# Patient Record
Sex: Male | Born: 1955
Health system: Southern US, Community
[De-identification: ages and names within clinical notes are randomized; demographics above are authoritative.]

## PROBLEM LIST (undated history)

## (undated) DIAGNOSIS — I429 Cardiomyopathy, unspecified: Secondary | ICD-10-CM

## (undated) DIAGNOSIS — C801 Malignant (primary) neoplasm, unspecified: Secondary | ICD-10-CM

## (undated) DIAGNOSIS — M1990 Unspecified osteoarthritis, unspecified site: Secondary | ICD-10-CM

## (undated) DIAGNOSIS — I10 Essential (primary) hypertension: Secondary | ICD-10-CM

## (undated) DIAGNOSIS — E119 Type 2 diabetes mellitus without complications: Secondary | ICD-10-CM

## (undated) DIAGNOSIS — I5022 Chronic systolic (congestive) heart failure: Secondary | ICD-10-CM

## (undated) DIAGNOSIS — E785 Hyperlipidemia, unspecified: Secondary | ICD-10-CM

## (undated) HISTORY — DX: Essential (primary) hypertension: I10

## (undated) HISTORY — DX: Type 2 diabetes mellitus without complications: E11.9

## (undated) HISTORY — DX: Hyperlipidemia, unspecified: E78.5

---

## 1998-03-05 ENCOUNTER — Ambulatory Visit (HOSPITAL_COMMUNITY): Admission: RE | Admit: 1998-03-05 | Discharge: 1998-03-05 | Payer: Self-pay | Admitting: Internal Medicine

## 1998-04-01 ENCOUNTER — Ambulatory Visit (HOSPITAL_COMMUNITY): Admission: RE | Admit: 1998-04-01 | Discharge: 1998-04-01 | Payer: Self-pay | Admitting: Cardiology

## 1998-04-21 ENCOUNTER — Encounter (HOSPITAL_COMMUNITY): Admission: RE | Admit: 1998-04-21 | Discharge: 1998-07-20 | Payer: Self-pay | Admitting: Cardiology

## 2001-11-21 ENCOUNTER — Encounter: Admission: RE | Admit: 2001-11-21 | Discharge: 2002-02-19 | Payer: Self-pay | Admitting: Family Medicine

## 2003-01-13 ENCOUNTER — Other Ambulatory Visit: Admission: RE | Admit: 2003-01-13 | Discharge: 2003-01-13 | Payer: Self-pay | Admitting: Family Medicine

## 2003-02-08 ENCOUNTER — Ambulatory Visit (HOSPITAL_COMMUNITY): Admission: RE | Admit: 2003-02-08 | Discharge: 2003-02-08 | Payer: Self-pay | Admitting: Orthopedic Surgery

## 2003-02-08 ENCOUNTER — Encounter: Payer: Self-pay | Admitting: Orthopedic Surgery

## 2003-07-26 HISTORY — PX: SHOULDER SURGERY: SHX246

## 2003-09-10 ENCOUNTER — Ambulatory Visit (HOSPITAL_COMMUNITY): Admission: RE | Admit: 2003-09-10 | Discharge: 2003-09-10 | Payer: Self-pay | Admitting: Orthopedic Surgery

## 2003-09-20 ENCOUNTER — Ambulatory Visit (HOSPITAL_COMMUNITY): Admission: AD | Admit: 2003-09-20 | Discharge: 2003-09-22 | Payer: Self-pay | Admitting: Orthopedic Surgery

## 2006-03-08 ENCOUNTER — Encounter: Admission: RE | Admit: 2006-03-08 | Discharge: 2006-06-06 | Payer: Self-pay | Admitting: Family Medicine

## 2006-05-31 ENCOUNTER — Encounter: Admission: RE | Admit: 2006-05-31 | Discharge: 2006-08-29 | Payer: Self-pay | Admitting: Family Medicine

## 2013-02-11 ENCOUNTER — Telehealth (HOSPITAL_COMMUNITY): Payer: Self-pay | Admitting: Cardiovascular Disease

## 2013-03-11 ENCOUNTER — Telehealth (HOSPITAL_COMMUNITY): Payer: Self-pay | Admitting: Cardiovascular Disease

## 2013-03-14 ENCOUNTER — Other Ambulatory Visit (HOSPITAL_COMMUNITY): Payer: Self-pay | Admitting: Cardiovascular Disease

## 2013-03-14 DIAGNOSIS — I119 Hypertensive heart disease without heart failure: Secondary | ICD-10-CM

## 2013-03-26 ENCOUNTER — Ambulatory Visit (HOSPITAL_COMMUNITY)
Admission: RE | Admit: 2013-03-26 | Discharge: 2013-03-26 | Disposition: A | Discharge status: Home / Self Care | Payer: 59 | Source: Ambulatory Visit | Attending: Cardiovascular Disease | Admitting: Cardiovascular Disease

## 2013-03-26 ENCOUNTER — Ambulatory Visit: Payer: Self-pay | Admitting: Cardiovascular Disease

## 2013-03-26 DIAGNOSIS — E785 Hyperlipidemia, unspecified: Secondary | ICD-10-CM | POA: Insufficient documentation

## 2013-03-26 DIAGNOSIS — I119 Hypertensive heart disease without heart failure: Secondary | ICD-10-CM

## 2013-03-26 DIAGNOSIS — I10 Essential (primary) hypertension: Secondary | ICD-10-CM | POA: Insufficient documentation

## 2013-03-26 DIAGNOSIS — E669 Obesity, unspecified: Secondary | ICD-10-CM | POA: Insufficient documentation

## 2013-03-26 DIAGNOSIS — E119 Type 2 diabetes mellitus without complications: Secondary | ICD-10-CM | POA: Insufficient documentation

## 2013-03-26 NOTE — Progress Notes (Signed)
[facility]   2D echo completed 03/26/2013.   Cindy Omara Alcon, RDCS  

## 2013-04-02 ENCOUNTER — Encounter: Payer: Self-pay | Admitting: Cardiovascular Disease

## 2013-04-02 ENCOUNTER — Ambulatory Visit (INDEPENDENT_AMBULATORY_CARE_PROVIDER_SITE_OTHER): Payer: 59 | Admitting: Cardiovascular Disease

## 2013-04-02 ENCOUNTER — Ambulatory Visit: Payer: Self-pay | Admitting: Cardiovascular Disease

## 2013-04-02 VITALS — BP 110/80 | HR 102 | Ht 72.0 in | Wt 255.2 lb

## 2013-04-02 DIAGNOSIS — B3324 Viral cardiomyopathy: Secondary | ICD-10-CM

## 2013-04-02 DIAGNOSIS — E119 Type 2 diabetes mellitus without complications: Secondary | ICD-10-CM

## 2013-04-02 DIAGNOSIS — E11621 Type 2 diabetes mellitus with foot ulcer: Secondary | ICD-10-CM | POA: Insufficient documentation

## 2013-04-02 DIAGNOSIS — I498 Other specified cardiac arrhythmias: Secondary | ICD-10-CM

## 2013-04-02 DIAGNOSIS — I428 Other cardiomyopathies: Secondary | ICD-10-CM

## 2013-04-02 DIAGNOSIS — R000 Tachycardia, unspecified: Secondary | ICD-10-CM

## 2013-04-02 DIAGNOSIS — E785 Hyperlipidemia, unspecified: Secondary | ICD-10-CM

## 2013-04-02 DIAGNOSIS — E669 Obesity, unspecified: Secondary | ICD-10-CM | POA: Insufficient documentation

## 2013-04-02 MED ORDER — METOPROLOL SUCCINATE ER 50 MG PO TB24: ORAL_TABLET | ORAL | Status: DC

## 2013-04-02 MED ORDER — RAMIPRIL 10 MG PO CAPS: 10.0000 mg | ORAL_CAPSULE | Freq: Every day | ORAL | Status: DC

## 2013-04-02 NOTE — Patient Instructions (Signed)
Your physician recommends that you return for lab work in: 3 months. Your physician recommends that you schedule a follow-up appointment in: 3 months.  Your physician has recommended you make the following change in your medication: decrease your ramipril to 1 pill daily ( 10 mg)  Start new prescription for metoprolol as directed on the bottle. This prescription has already been sent to your pharmacy.

## 2013-04-02 NOTE — Progress Notes (Signed)
Patient ID: Brian Herring, male   DOB: [birth year], 57 y.o.   MRN: [id number]     HPI: Brian Herring, is a 57 y.o. male who presents to the office for a one-year cardiology evaluation.  Brian Herring is a former patient of Dr. Lavonne Chick. 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 With medical therapy only function has improved to 45-50% range noted on echo Doppler study in April 2012. He recently underwent to your followup echo Doppler study of 03/26/2013. Ejection fraction was now 45-50% with diffuse hypokinesis. He had normal pulmonary pressures.  Brian Herring remains fairly active. He is a truck Office manager in showers. He denies recent episodes of chest pain. He is unaware of tachycardia palpitations. He tells me Dr. Gildardo Cranker recently checked laboratory. We will try to obtain these results for review  Past Medical History  Diagnosis Date  . Hypertension   . Hyperlipidemia   . Diabetes mellitus without complication     Past Surgical History  Procedure Laterality Date  . Shoulder surgery  01/05    Allergies  Allergen Reactions  . Sulfur Rash    Current Outpatient Prescriptions  Medication Sig Dispense Refill  . aspirin 325 MG tablet Take 325 mg by mouth daily.      . Multiple Vitamins-Minerals (MULTIVITAL PO) Take by mouth.      . spironolactone (ALDACTONE) 25 MG tablet Take 25 mg by mouth 2 (two) times daily. 1/2 tablet      . amLODipine (NORVASC) 5 MG tablet Take 1 tablet by mouth daily.      Marland Kitchen atorvastatin (LIPITOR) 40 MG tablet Take 1 tablet by mouth daily.      . fenofibrate micronized (LOFIBRA) 200 MG capsule Take 1 capsule by mouth daily.      Marland Kitchen glimepiride (AMARYL) 2 MG tablet Take 1 tablet by mouth daily.      . metFORMIN (GLUCOPHAGE) 500 MG tablet Take 2 tablets by mouth 2 (two) times daily.      . ONE TOUCH ULTRA TEST test strip       . ONETOUCH DELICA LANCETS FINE MISC       . ramipril (ALTACE) 10 MG capsule  Take 2 capsules by mouth daily.      . traZODone (DESYREL) 150 MG tablet        No current facility-administered medications for this visit.    History   Social History  . Marital Status: Single    Spouse Name: N/A    Number of Children: N/A  . Years of Education: N/A   Occupational History  . Not on file.   Social History Main Topics  . Smoking status: Never Smoker   . Smokeless tobacco: Never Used  . Alcohol Use: No  . Drug Use: Not on file  . Sexual Activity: Not on file   Other Topics Concern  . Not on file   Social History Narrative  . No narrative on file    Family History  Problem Relation Age of Onset  . Pneumonia Mother     aspiration   Socially he is single with no children. No tobacco or alcohol use. He does not routinely exercise.  ROS is negative for fevers, chills or night sweats. He denies any significant weight loss. He denies wheezing. He denies PND. He denies tachycardia palpitations. He denies chest pain. He denies change in bowel or bladder habits. There is no claudication. There  are no myalgias. He denies rash. He does have diabetes as well as hyperlipidemia.   Other system review is negative.  PE BP 110/80  Pulse 102  Ht 6' (1.829 m)  Wt 255 lb 3.2 oz (115.758 kg)  BMI 34.6 kg/m2  General: Alert, oriented, no distress.  Skin: normal turgor, no rashes HEENT: Normocephalic, atraumatic. Pupils round and reactive; sclera anicteric;no lid lag.  Nose without nasal septal hypertrophy Mouth/Parynx benign; Mallinpatti scale 3 Neck: No JVD, no carotid briuts Lungs: clear to ausculatation and percussion; no wheezing or rales Heart: RRR, s1 s2 normal faint 1/6 systolic murmur. Abdomen: Moderate to Poss soft, nontender; no hepatosplenomehaly, BS+; abdominal aorta nontender and not dilated by palpation. Pulses 2+ Extremities: no clubbing cyanosis or edema, Homan's sign negative  Neurologic: grossly nonfocal  ECG: Sinus tachycardia 102 beats per  minute. QTc interval 461 ms.  LABS:  BMET No results found for this basename: na, k, cl, co2, glucose, bun, creatinine, calcium, gfrnonaa, gfraa     Hepatic Function Panel  No results found for this basename: prot, albumin, ast, alt, alkphos, bilitot, bilidir, ibili     CBC No results found for this basename: wbc, rbc, hgb, hct, plt, mcv, mch, mchc, rdw, neutrabs, lymphsabs, monoabs, eosabs, basosabs     BNP No results found for this basename: probnp    Lipid Panel  No results found for this basename: chol, trig, hdl, cholhdl, vldl, ldlcalc     RADIOLOGY: No results found.    ASSESSMENT AND PLAN: Brian Herring is now 57 years old. His most recent echo Doppler study shows a stable ejection fraction which remains now in the 45-50% range, unchanged from April 2012. His blood pressure is somewhat low and he does have evidence for sinus tachycardia. Try to obtain the results of the lab work done by Dr. Tenny Craw. If thyroid function studies have not been checked these should be assessed. Presently, I am recommending he reduce his whole today's dose from 20 mg down to 10 mg daily. Electing to start cardioselective beta blocker therapy with Toprol-XL 25 mg for 2 weeks and then he will titrate this to 50 mg daily. I will see him in 3 months if up evaluation and prior to that office as a complete set of laboratory will be obtained. We did discuss the importance of weight loss and increased exercise.     Lennette Bihari, MD, [facility]  04/02/2013 5:40 PM

## 2013-04-03 ENCOUNTER — Other Ambulatory Visit: Payer: Self-pay | Admitting: *Deleted

## 2013-04-03 DIAGNOSIS — I498 Other specified cardiac arrhythmias: Secondary | ICD-10-CM

## 2013-04-03 DIAGNOSIS — I428 Other cardiomyopathies: Secondary | ICD-10-CM

## 2013-04-03 DIAGNOSIS — E119 Type 2 diabetes mellitus without complications: Secondary | ICD-10-CM

## 2013-04-15 ENCOUNTER — Encounter: Payer: Self-pay | Admitting: *Deleted

## 2013-06-27 ENCOUNTER — Telehealth: Payer: Self-pay | Admitting: *Deleted

## 2013-06-27 ENCOUNTER — Encounter: Payer: Self-pay | Admitting: *Deleted

## 2013-06-27 ENCOUNTER — Other Ambulatory Visit: Payer: Self-pay | Admitting: *Deleted

## 2013-06-27 DIAGNOSIS — E119 Type 2 diabetes mellitus without complications: Secondary | ICD-10-CM

## 2013-06-27 DIAGNOSIS — I498 Other specified cardiac arrhythmias: Secondary | ICD-10-CM

## 2013-06-27 NOTE — Telephone Encounter (Signed)
Released and mailed lab orders for patient to get blood drawn.

## 2013-06-27 NOTE — Telephone Encounter (Signed)
Message copied by Gaynelle Cage on Thu Jun 27, 2013 11:17 AM ------      Message from: Gaynelle Cage.      Created: Tue Apr 02, 2013  5:52 PM       Send patient labslip to get blood drawn in 3 months. ------

## 2013-07-23 LAB — CBC
HCT: 45.8 % (ref 39.0–52.0)
Hemoglobin: 15.8 g/dL (ref 13.0–17.0)
MCHC: 34.5 g/dL (ref 30.0–36.0)
MCV: 86.4 fL (ref 78.0–100.0)
RBC: 5.3 MIL/uL (ref 4.22–5.81)
RDW: 14.3 % (ref 11.5–15.5)
WBC: 10.2 10*3/uL (ref 4.0–10.5)

## 2013-07-24 LAB — COMPREHENSIVE METABOLIC PANEL
ALT: 53 U/L (ref 0–53)
AST: 48 U/L — ABNORMAL HIGH (ref 0–37)
Albumin: 4.4 g/dL (ref 3.5–5.2)
Alkaline Phosphatase: 110 U/L (ref 39–117)
BUN: 20 mg/dL (ref 6–23)
CO2: 25 mEq/L (ref 19–32)
Calcium: 9.2 mg/dL (ref 8.4–10.5)
Chloride: 95 mEq/L — ABNORMAL LOW (ref 96–112)
Creat: 0.76 mg/dL (ref 0.50–1.35)
Glucose, Bld: 278 mg/dL — ABNORMAL HIGH (ref 70–99)
Potassium: 4.6 mEq/L (ref 3.5–5.3)
Sodium: 134 mEq/L — ABNORMAL LOW (ref 135–145)
Total Bilirubin: 0.5 mg/dL (ref 0.3–1.2)
Total Protein: 6.7 g/dL (ref 6.0–8.3)

## 2013-07-24 LAB — LIPID PANEL
HDL: 30 mg/dL — ABNORMAL LOW (ref >39)
LDL Cholesterol: 26 mg/dL (ref 0–99)
Total CHOL/HDL Ratio: 4.2 Ratio
Triglycerides: 354 mg/dL — ABNORMAL HIGH (ref <150)

## 2013-07-24 LAB — TSH: TSH: 1.978 u[IU]/mL (ref 0.350–4.500)

## 2013-07-24 LAB — HEMOGLOBIN A1C: Mean Plasma Glucose: 358 mg/dL — ABNORMAL HIGH (ref <117)

## 2013-09-05 ENCOUNTER — Telehealth: Payer: Self-pay | Admitting: *Deleted

## 2013-09-05 NOTE — Telephone Encounter (Signed)
Left message to return a call to discuss lab results. 

## 2013-09-20 ENCOUNTER — Ambulatory Visit (INDEPENDENT_AMBULATORY_CARE_PROVIDER_SITE_OTHER): Payer: 59 | Admitting: Cardiovascular Disease

## 2013-09-20 ENCOUNTER — Encounter: Payer: Self-pay | Admitting: Cardiovascular Disease

## 2013-09-20 VITALS — BP 138/82 | HR 95 | Ht 72.0 in | Wt 250.4 lb

## 2013-09-20 DIAGNOSIS — B3324 Viral cardiomyopathy: Secondary | ICD-10-CM

## 2013-09-20 DIAGNOSIS — R000 Tachycardia, unspecified: Secondary | ICD-10-CM

## 2013-09-20 DIAGNOSIS — E669 Obesity, unspecified: Secondary | ICD-10-CM

## 2013-09-20 DIAGNOSIS — E119 Type 2 diabetes mellitus without complications: Secondary | ICD-10-CM

## 2013-09-20 DIAGNOSIS — E785 Hyperlipidemia, unspecified: Secondary | ICD-10-CM

## 2013-09-20 DIAGNOSIS — I428 Other cardiomyopathies: Secondary | ICD-10-CM

## 2013-09-20 DIAGNOSIS — I498 Other specified cardiac arrhythmias: Secondary | ICD-10-CM

## 2013-09-20 MED ORDER — FENOFIBRATE 145 MG PO TABS
145.0000 mg | ORAL_TABLET | Freq: Every day | ORAL | Status: DC
Start: 2013-09-20 — End: 2014-07-04

## 2013-09-20 MED ORDER — OMEGA-3-ACID ETHYL ESTERS 1 G PO CAPS: 2.0000 g | ORAL_CAPSULE | Freq: Two times a day (BID) | ORAL | Status: DC

## 2013-09-20 MED ORDER — METOPROLOL SUCCINATE ER 100 MG PO TB24: 100.0000 mg | ORAL_TABLET | Freq: Every day | ORAL | Status: DC

## 2013-09-20 NOTE — Patient Instructions (Signed)
Your physician has recommended you make the following change in your medication: start new prescription for fenofibrate 145 mg, lovaza 1 gram, increase the metoptolol succ to 100mg . STOP the carvedilol. DECREASE the aspirin to 81 mg.   Schedule a follow up appointment with your endocinologist.  Your physician recommends that you schedule a follow-up appointment in: 6 months.

## 2013-09-21 ENCOUNTER — Encounter: Payer: Self-pay | Admitting: Cardiovascular Disease

## 2013-09-21 NOTE — Progress Notes (Signed)
Patient ID: Brian Herring, male   DOB: 1956/07/17, 58 y.o.   MRN: [id number]      HPI: Brian Herring, is a 58 y.o. male who presents to the office for a 6 month cardiology evaluation.  Brian Herring is a former patient of Brian Herring. 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 With medical therapy EF has improved to 45-50% range noted on echo Doppler study in April 2012. An echo Doppler study of 03/26/2013 showed EF  45-50% with diffuse hypokinesis. He had normal pulmonary pressures.  Brian Herring remains fairly active. He is a truck Dispensing optician in showers. He denies recent episodes of chest pain. He is unaware of tachycardia palpitations. He tells me Brian Herring recently checked laboratory.  Apparently, he has been confused and taking some of his medications. He apparently is still taking carvedilol but is taking this 25 mg once a day as well as metoprolol succinate 50 mg daily. He denies recent episodes of chest pain or shortness of breath.  Recent laboratory shows significant triglyceride elevation at 354 with a low HDL at 30 and markedly increased VLDL at 71 of those total cholesterol was 127 and LDL 26 consistent with an atherogenic dyslipidemia pattern. His TSH was normal at 1.978. Hemoglobin A1c was markedly elevated at 14.1 with a mean plasma glucose of 358. Apparently, Brian Herring stopped taking his fenofibrate and has been eating poorly. He presents for evaluation.  Past Medical History  Diagnosis Date  . Hypertension   . Hyperlipidemia   . Diabetes mellitus without complication     Past Surgical History  Procedure Laterality Date  . Shoulder surgery  01/05    Allergies  Allergen Reactions  . Sulfur Rash    Current Outpatient Prescriptions  Medication Sig Dispense Refill  . acarbose (PRECOSE) 25 MG tablet Take 1 tablet by mouth 2 (two) times daily.      Marland Kitchen amLODipine (NORVASC) 5 MG tablet Take 1 tablet by mouth  daily.      Marland Kitchen aspirin 81 MG tablet Take 81 mg by mouth daily.      Marland Kitchen atorvastatin (LIPITOR) 40 MG tablet Take 1 tablet by mouth daily.      Marland Kitchen glimepiride (AMARYL) 2 MG tablet Take 1 tablet by mouth daily.      . metFORMIN (GLUCOPHAGE) 500 MG tablet Take 2 tablets by mouth 2 (two) times daily.      . Multiple Vitamins-Minerals (MULTIVITAL PO) Take by mouth.      . ramipril (ALTACE) 10 MG capsule Take 1 capsule (10 mg total) by mouth daily.  30 capsule  6  . spironolactone (ALDACTONE) 25 MG tablet Take 25 mg by mouth 2 (two) times daily. 1/2 tablet      . fenofibrate (TRICOR) 145 MG tablet Take 1 tablet (145 mg total) by mouth daily.  30 tablet  6  . metoprolol succinate (TOPROL-XL) 100 MG 24 hr tablet Take 1 tablet (100 mg total) by mouth daily. Take with or immediately following a meal.  30 tablet  6  . omega-3 acid ethyl esters (LOVAZA) 1 G capsule Take 2 capsules (2 g total) by mouth 2 (two) times daily.  120 capsule  11   No current facility-administered medications for this visit.    History   Social History  . Marital Status: Single    Spouse Name: N/A    Number of Children: N/A  . Years  of Education: N/A   Occupational History  . Not on file.   Social History Main Topics  . Smoking status: Never Smoker   . Smokeless tobacco: Never Used  . Alcohol Use: No  . Drug Use: Not on file  . Sexual Activity: Not on file   Other Topics Concern  . Not on file   Social History Narrative  . No narrative on file    Family History  Problem Relation Age of Onset  . Pneumonia Mother     aspiration   Socially he is single with no children. No tobacco or alcohol use. He does not routinely exercise.  ROS is negative for fevers, chills or night sweats. He admits to a 5 pound weight loss.  He denies change in vision or hearing. He is unaware of lymphadenopathy. He denies wheezing. He denies PND orthopnea. He denies tachypalpitations. He denies chest pain. He denies nausea vomiting or  diarrhea. He is unaware of blood in stool or urine. He denies change in bowel or bladder habits. There is no claudication. There are no myalgias. He denies rash. He does have diabetes as well as hyperlipidemia.   He denies significant sleep issues. Other comprehensive 14 point system review is negative.  PE BP 138/82  Pulse 95  Ht 6' (1.829 m)  Wt 250 lb 6.4 oz (113.581 kg)  BMI 33.95 kg/m2  General: Alert, oriented, no distress.  Skin: normal turgor, no rashes HEENT: Normocephalic, atraumatic. Pupils round and reactive; sclera anicteric;no lid lag.  Nose without nasal septal hypertrophy Mouth/Parynx benign; Mallinpatti scale 3 Neck: No JVD, no carotid bruits with normal carotid upstroke Lungs: clear to ausculatation and percussion; no wheezing or rales Chest wall: Nontender to palpation Heart: RRR, s1 s2 normal faint 1/6 systolic murmur. No diastolic murmurs heard no rubs thrills or heaves Abdomen: Moderate central adiposity, nontender; no hepatosplenomehaly, BS+; abdominal aorta nontender and not dilated by palpation. Back: No CVA tenderness Pulses 2+ Extremities: no clubbing cyanosis or edema, Homan's sign negative  Neurologic: grossly nonfocal Psychological: Normal affect and mood  ECG (independently read by (: Normal sinus rhythm at 95 beats per minute; PR interval 160 ms, QTc interval 457 bili sec  Prior ECG of 04/02/2013: Sinus tachycardia 102 beats per minute. QTc interval 461 ms.  LABS:  BMET    Component Value Date/Time   NA 134* 07/23/2013 1608     Hepatic Function Panel     Component Value Date/Time   PROT 6.7 07/23/2013 1608     CBC    Component Value Date/Time   WBC 10.2 07/23/2013 1606     BNP No results found for this basename: probnp    Lipid Panel     Component Value Date/Time   CHOL 127 07/23/2013 1603     RADIOLOGY: No results found.    ASSESSMENT AND PLAN: Brian Herring is a 58 years old with a previous history of a presumed  viral cardiomyopathy.  His most recent echo Doppler study shows a stable ejection fraction which remains now in the 45-50% range, unchanged from April 2012. His blood pressure today is improved on his reduced dose of amlodipine at 5 mg. However, he has been confused with his medications. I have recommended he discontinue carvedilol altogether and I will further titrate his Toprol-XL to 50 mg daily which should allow for improved rate control. I have recommended resumption of fenofibrate 145 mg and adding low positive capsules twice a day for his significant hypertriglyceridemia. He needs  significantly improved diabetic control and he will followup with Dr. Tenny Craw who is his primary physician for his diabetic management. He will need to lose additional weight. I will see him in 6 months for cardiology reevaluation.   Lennette Bihari, MD, [facility]  09/21/2013 7:28 PM

## 2013-12-20 ENCOUNTER — Ambulatory Visit (INDEPENDENT_AMBULATORY_CARE_PROVIDER_SITE_OTHER): Payer: 59 | Admitting: Surgery

## 2013-12-23 ENCOUNTER — Other Ambulatory Visit: Payer: Self-pay | Admitting: *Deleted

## 2013-12-23 DIAGNOSIS — B3324 Viral cardiomyopathy: Secondary | ICD-10-CM

## 2013-12-23 MED ORDER — RAMIPRIL 10 MG PO CAPS: 10.0000 mg | ORAL_CAPSULE | Freq: Every day | ORAL | Status: DC

## 2013-12-23 NOTE — Telephone Encounter (Signed)
Rx was sent to pharmacy electronically. 

## 2014-01-06 ENCOUNTER — Other Ambulatory Visit: Payer: Self-pay

## 2014-01-06 MED ORDER — SPIRONOLACTONE 25 MG PO TABS: 12.5000 mg | ORAL_TABLET | Freq: Two times a day (BID) | ORAL | Status: DC

## 2014-01-06 NOTE — Telephone Encounter (Signed)
Rx was sent to pharmacy electronically. 

## 2014-07-01 ENCOUNTER — Telehealth: Payer: Self-pay | Admitting: *Deleted

## 2014-07-01 NOTE — Telephone Encounter (Signed)
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 Left message for patient to call to make an appointment. He will need to be seen for clearance.

## 2014-07-04 ENCOUNTER — Encounter: Payer: Self-pay | Admitting: Cardiology

## 2014-07-04 ENCOUNTER — Ambulatory Visit (INDEPENDENT_AMBULATORY_CARE_PROVIDER_SITE_OTHER): Payer: 59 | Admitting: Cardiology

## 2014-07-04 VITALS — BP 130/94 | HR 98 | Ht 72.0 in | Wt 240.9 lb

## 2014-07-04 DIAGNOSIS — I10 Essential (primary) hypertension: Secondary | ICD-10-CM | POA: Insufficient documentation

## 2014-07-04 DIAGNOSIS — B3324 Viral cardiomyopathy: Secondary | ICD-10-CM

## 2014-07-04 DIAGNOSIS — I43 Cardiomyopathy in diseases classified elsewhere: Secondary | ICD-10-CM

## 2014-07-04 DIAGNOSIS — E785 Hyperlipidemia, unspecified: Secondary | ICD-10-CM

## 2014-07-04 DIAGNOSIS — E669 Obesity, unspecified: Secondary | ICD-10-CM

## 2014-07-04 NOTE — Assessment & Plan Note (Signed)
Blood pressure is stable and has been lower at other office visits.

## 2014-07-04 NOTE — Progress Notes (Signed)
07/04/2014   PCP: Gus Height, MD   Chief Complaint  Patient presents with  . Follow-up    pt need medical clearance for driving.    Primary Cardiologist:Dr. Corky Downs   HPI: Brian Herring 58 year old male,  is a former patient of Dr. Janene Madeira. 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 With medical therapy EF has improved to 45-50% range noted on echo Doppler study in April 2012. An echo Doppler study of 03/26/2013 showed EF 45-50% with diffuse hypokinesis. He had normal pulmonary pressures. 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    He denies recent episodes of chest pain. He is unaware of tachycardia palpitations. He tells me Dr. Lona Kettle recently checked laboratory.  His diabetes is not well controlled and we discussed importance of diet and exercise. He admits to having D slide for the last little while.  He has had no shortness of breath no edema.      Allergies  Allergen Reactions  . Sulfur Rash    Current Outpatient Prescriptions  Medication Sig Dispense Refill  . acarbose (PRECOSE) 25 MG tablet Take 1 tablet by mouth 2 (two) times daily.    Marland Kitchen amLODipine (NORVASC) 5 MG tablet Take 1 tablet by mouth daily.    Marland Kitchen aspirin 81 MG tablet Take 81 mg by mouth daily.    Marland Kitchen atorvastatin (LIPITOR) 40 MG tablet Take 1 tablet by mouth daily.    Marland Kitchen glimepiride (AMARYL) 4 MG tablet Take 1 tablet by mouth daily.  1  . metFORMIN (GLUCOPHAGE) 500 MG tablet Take 2 tablets by mouth 2 (two) times daily.    . metoprolol succinate (TOPROL-XL) 100 MG 24 hr tablet Take 1 tablet (100 mg total) by mouth daily. Take with or immediately following a meal. 30 tablet 6  . Multiple Vitamins-Minerals (MULTIVITAL PO) Take by mouth.    . ramipril (ALTACE) 10 MG capsule Take 1 capsule (10 mg total) by mouth daily. 30 capsule 7  . spironolactone (ALDACTONE) 25 MG tablet Take  0.5 tablets (12.5 mg total) by mouth 2 (two) times daily. 90 tablet 2   No current facility-administered medications for this visit.    Past Medical History  Diagnosis Date  . Hypertension   . Hyperlipidemia   . Diabetes mellitus without complication     Past Surgical History  Procedure Laterality Date  . Shoulder surgery  01/05    YQM:VHQIONG:EX colds or fevers, some weight loss Skin:no rashes or ulcers HEENT:no blurred vision, no congestion CV:see HPI PUL:see HPI GI:no diarrhea constipation or melena, no indigestion GU:no hematuria, no dysuria MS:no joint pain, no claudication Neuro:no syncope, no lightheadedness Endo:+ diabetes  Now watching diet closer., no thyroid disease  Wt Readings from Last 3 Encounters:  07/04/14 240 lb 14.4 oz (109.272 kg)  09/20/13 250 lb 6.4 oz (113.581 kg)  04/02/13 255 lb 3.2 oz (115.758 kg)    PHYSICAL EXAM BP 130/94 mmHg  Pulse 98  Ht 6' (1.829 m)  Wt 240 lb 14.4 oz (109.272 kg)  BMI 32.66 kg/m2 General:Pleasant affect, NAD Skin:Warm and dry, brisk capillary refill HEENT:normocephalic, sclera clear, mucus membranes moist Neck:supple, no JVD, no bruits  Heart:S1S2 RRR without murmur, gallup, rub or click Lungs:clear without rales, rhonchi, or wheezes BMW:UXLK, non tender, + BS, do not palpate liver spleen or  masses Ext:no lower ext edema, 2+ pedal pulses, 2+ radial pulses Neuro:alert and oriented, MAE, follows commands, + facial symmetry EKG:SR without Changes form 08/2013  ASSESSMENT AND PLAN Viral cardiomyopathy, EF improved to 45-50% He has no shortness of breath no chest pain no edema. Since 2012 his ejection fraction has been stable.  He'll see Dr. Corky Downs   back in 6 months at that time he has been following echocardiogram every 2 years.  HTN (hypertension), benign Blood pressure is stable and has been lower at other office visits.  Hyperlipidemia Followed by PCP, he is on Lipitor.  Obesity Discussed diet exercise  and weight loss.

## 2014-07-04 NOTE — Assessment & Plan Note (Signed)
Discussed diet/exercise and weight loss.  

## 2014-07-04 NOTE — Assessment & Plan Note (Signed)
He has no shortness of breath no chest pain no edema. Since 2012 his ejection fraction has been stable.  He'll see Dr. Corky Downs   back in 6 months at that time he has been following echocardiogram every 2 years.

## 2014-07-04 NOTE — Assessment & Plan Note (Signed)
Followed by PCP, he is on Lipitor.

## 2014-07-04 NOTE — Patient Instructions (Signed)
Your physician wants you to follow-up in 6 months with Dr. Kelly. You will receive a reminder letter in the mail 2 months in advance. If you do not receive a letter, please call our office to schedule the follow-up appointment.  

## 2014-07-08 ENCOUNTER — Telehealth: Payer: Self-pay | Admitting: *Deleted

## 2014-07-08 NOTE — Telephone Encounter (Signed)
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

## 2015-02-09 ENCOUNTER — Other Ambulatory Visit: Payer: Self-pay

## 2015-02-09 MED ORDER — SPIRONOLACTONE 25 MG PO TABS: 12.5000 mg | ORAL_TABLET | Freq: Two times a day (BID) | ORAL | Status: DC

## 2015-02-09 NOTE — Telephone Encounter (Signed)
Rx(s) sent to pharmacy electronically.  

## 2015-04-03 ENCOUNTER — Ambulatory Visit: Payer: Self-pay | Admitting: Cardiovascular Disease

## 2015-05-29 ENCOUNTER — Ambulatory Visit (INDEPENDENT_AMBULATORY_CARE_PROVIDER_SITE_OTHER): Payer: 59 | Admitting: Cardiovascular Disease

## 2015-05-29 VITALS — BP 110/74 | HR 97 | Ht 72.0 in | Wt 253.5 lb

## 2015-05-29 DIAGNOSIS — E669 Obesity, unspecified: Secondary | ICD-10-CM

## 2015-05-29 DIAGNOSIS — B3324 Viral cardiomyopathy: Secondary | ICD-10-CM

## 2015-05-29 DIAGNOSIS — I10 Essential (primary) hypertension: Secondary | ICD-10-CM | POA: Diagnosis not present

## 2015-05-29 DIAGNOSIS — I429 Cardiomyopathy, unspecified: Secondary | ICD-10-CM

## 2015-05-29 DIAGNOSIS — E1165 Type 2 diabetes mellitus with hyperglycemia: Secondary | ICD-10-CM

## 2015-05-29 DIAGNOSIS — E785 Hyperlipidemia, unspecified: Secondary | ICD-10-CM

## 2015-05-29 MED ORDER — METOPROLOL SUCCINATE ER 25 MG PO TB24: 25.0000 mg | ORAL_TABLET | Freq: Every day | ORAL | Status: DC

## 2015-05-29 MED ORDER — SPIRONOLACTONE 25 MG PO TABS: 12.5000 mg | ORAL_TABLET | Freq: Every day | ORAL | Status: DC

## 2015-05-29 NOTE — Patient Instructions (Addendum)
Your physician has recommended you make the following change in your medication: the spironolactone has been decreased to 12.5 mg ONCE a day.  ( 1/2 tablet) A new prescription for metoprolol has been sent to your pharmacy. Take as directed on the bottle.  Your physician has requested that you have an echocardiogram. Echocardiography is a painless test that uses sound waves to create images of your heart. It provides your doctor with information about the size and shape of your heart and how well your heart's chambers and valves are working. This procedure takes approximately one hour. There are no restrictions for this procedure.   Your physician recommends that you schedule a follow-up appointment in: 3 months with Dr. Claiborne Billings

## 2015-05-31 ENCOUNTER — Encounter: Payer: Self-pay | Admitting: Cardiovascular Disease

## 2015-05-31 DIAGNOSIS — E1165 Type 2 diabetes mellitus with hyperglycemia: Secondary | ICD-10-CM | POA: Insufficient documentation

## 2015-05-31 NOTE — Progress Notes (Signed)
Patient ID: Brian Herring, male   DOB: 08-01-1955, 59 y.o.   MRN: [id number]      HPI: Brian Herring, is a 59 y.o. male who presents to the office for a 21 month cardiology evaluation.  Brian Herring is a former patient of Dr. Janene Herring. 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 With medical therapy EF has improved to 45-50% range noted on echo Doppler study in April 2012. An echo Doppler study of 03/26/2013 showed EF  45-50% with diffuse hypokinesis. He had normal pulmonary pressures.  Brian Herring remains fairly active. He is a truck Dispensing optician in showers.  He has a history of hyperlipidemia with significant triglyceride elevation.  Laboratory prior to his last office visit revealed significant triglyceride elevation at 354 with a low HDL at 30 and markedly increased VLDL at 71 of those total cholesterol was 127 and LDL 26 consistent with an atherogenic dyslipidemia pattern. His TSH was normal at 1.978. Hemoglobin A1c was markedly elevated at 14.1 with a mean plasma glucose of 358.   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  He has noticed rare episode of chest flutter.  He tells me his primary physician, Dr. Harrington Herring had checked follow-up blood work.  He denies episodes of chest pressure.  He denies presyncope or syncope.  He denies any edema.  He presents for evaluation.   Past Medical History  Diagnosis Date  . Hypertension   . Hyperlipidemia   . Diabetes mellitus without complication [facility])     Past Surgical History  Procedure Laterality Date  . Shoulder surgery  01/05    Allergies  Allergen Reactions  . Sulfur Rash    Current Outpatient Prescriptions  Medication Sig Dispense Refill  . acarbose (PRECOSE) 25 MG tablet Take 1 tablet by mouth 3 (three) times daily with meals.     Marland Kitchen amLODipine (NORVASC) 5 MG tablet Take 1 tablet by mouth daily.    Marland Kitchen aspirin 81 MG tablet Take 81 mg by mouth daily.     Marland Kitchen atorvastatin (LIPITOR) 40 MG tablet Take 1 tablet by mouth daily.    Marland Kitchen glimepiride (AMARYL) 4 MG tablet Take 1 tablet by mouth daily.  1  . metFORMIN (GLUCOPHAGE) 500 MG tablet Take 2 tablets by mouth 2 (two) times daily.    . Multiple Vitamins-Minerals (MULTIVITAL PO) Take by mouth.    . ramipril (ALTACE) 10 MG capsule Take 1 capsule (10 mg total) by mouth daily. 30 capsule 7  . spironolactone (ALDACTONE) 25 MG tablet Take 0.5 tablets (12.5 mg total) by mouth daily. 90 tablet 1  . metoprolol succinate (TOPROL XL) 25 MG 24 hr tablet Take 1 tablet (25 mg total) by mouth daily. 30 tablet 6   No current facility-administered medications for this visit.    Social History   Social History  . Marital Status: Single    Spouse Name: N/A  . Number of Children: N/A  . Years of Education: N/A   Occupational History  . Not on file.   Social History Main Topics  . Smoking status: Never Smoker   . Smokeless tobacco: Never Used  . Alcohol Use: No  . Drug Use: Not on file  . Sexual Activity: Not on file   Other Topics Concern  . Not on file   Social History Narrative    Family History  Problem Relation Age of Onset  .  Pneumonia Mother     aspiration   Socially he is single with no children. No tobacco or alcohol use. He does not routinely exercise.  ROS General: Negative; No fevers, chills, or night sweats;  HEENT: Negative; No changes in vision or hearing, sinus congestion, difficulty swallowing Pulmonary: Negative; No cough, wheezing, shortness of breath, hemoptysis Cardiovascular: See history of present illness. GI: Negative; No nausea, vomiting, diarrhea, or abdominal pain GU: Negative; No dysuria, hematuria, or difficulty voiding Musculoskeletal: Negative; no myalgias, joint pain, or weakness Hematologic/Oncology: Negative; no easy bruising, bleeding Endocrine: Negative; no heat/cold intolerance; no diabetes Neuro: Negative; no changes in balance, headaches Skin:  Negative; No rashes or skin lesions Psychiatric: Negative; No behavioral problems, depression Sleep: Negative; No snoring, daytime sleepiness, hypersomnolence, bruxism, restless legs, hypnogognic hallucinations, no cataplexy Other comprehensive 14 point system review is negative.   PE BP 110/74 mmHg  Pulse 97  Ht 6' (1.829 m)  Wt 253 lb 8 oz (114.987 kg)  BMI 34.37 kg/m2   Wt Readings from Last 3 Encounters:  05/29/15 253 lb 8 oz (114.987 kg)  07/04/14 240 lb 14.4 oz (109.272 kg)  09/20/13 250 lb 6.4 oz (113.581 kg)   General: Alert, oriented, no distress.  Skin: normal turgor, no rashes HEENT: Normocephalic, atraumatic. Pupils round and reactive; sclera anicteric;no lid lag.  Nose without nasal septal hypertrophy Mouth/Parynx benign; Mallinpatti scale 3 Neck: No JVD, no carotid bruits with normal carotid upstroke Lungs: clear to ausculatation and percussion; no wheezing or rales Chest wall: Nontender to palpation Heart: RRR, s1 s2 normal faint 1/6 systolic murmur. No diastolic murmurs heard no rubs thrills or heaves Abdomen: Moderate central adiposity, nontender; no hepatosplenomehaly, BS+; abdominal aorta nontender and not dilated by palpation. Back: No CVA tenderness Pulses 2+ Extremities: no clubbing cyanosis or edema, Homan's sign negative  Neurologic: grossly nonfocal Psychological: Normal affect and mood  ECG (independently read by me): Sinus rhythm at 97 bpm with an occasional PAC.  Incomplete left bundle-branch block.  February 2015 ECG (independently read by (: Normal sinus rhythm at 95 beats per minute; PR interval 160 ms, QTc interval 457 bili sec  Prior ECG of 04/02/2013: Sinus tachycardia 102 beats per minute. QTc interval 461 ms.  LABS: BMP Latest Ref Rng 07/23/2013  Glucose 70 - 99 mg/dL 278(H)  BUN 6 - 23 mg/dL 20  Creatinine 0.50 - 1.35 mg/dL 0.76  Sodium 135 - 145 mEq/L 134(L)  Potassium 3.5 - 5.3 mEq/L 4.6  Chloride 96 - 112 mEq/L 95(L)  CO2 19 - 32  mEq/L 25  Calcium 8.4 - 10.5 mg/dL 9.2   Hepatic Function Latest Ref Rng 07/23/2013  Total Protein 6.0 - 8.3 g/dL 6.7  Albumin 3.5 - 5.2 g/dL 4.4  AST 0 - 37 U/L 48(H)  ALT 0 - 53 U/L 53  Alk Phosphatase 39 - 117 U/L 110  Total Bilirubin 0.3 - 1.2 mg/dL 0.5   CBC Latest Ref Rng 07/23/2013  WBC 4.0 - 10.5 K/uL 10.2  Hemoglobin 13.0 - 17.0 g/dL 15.8  Hematocrit 39.0 - 52.0 % 45.8  Platelets 150 - 400 K/uL 266   Lab Results  Component Value Date   MCV 86.4 07/23/2013   Lab Results  Component Value Date   TSH 1.978 07/23/2013   Lipid Panel     Component Value Date/Time   CHOL 127 07/23/2013 1603   TRIG 354* 07/23/2013 1603   HDL 30* 07/23/2013 1603   CHOLHDL 4.2 07/23/2013 1603   VLDL 71* 07/23/2013 1603  LDLCALC 26 07/23/2013 1603    RADIOLOGY: No results found.    ASSESSMENT AND PLAN: Mr. Adeeb Konecny is a 59 years old with a previous history of a presumed viral cardiomyopathy. His last echo Doppler study in 2014 showed a stable ejection fraction which remains now in the 45-50% range, unchanged from April 2012. His blood pressure today is improved on his reduced dose of amlodipine at 5 mg. when I last saw him almost 2 years ago, he was on a beta blocker but is not on any presently.  Also, he has had issues with  poorly controlled diabetes.  He has had laboratory done by Dr. Harrington Herring and I will try to obtain recent blood work.  He has noticed some episodic palpitations.  I am adding Toprol-XL 25 mg daily.  There are no signs of edema.  He has been taking spironolactone 12.5 mg twice a day, and I will reduce this to just once a day since his blood pressure is already somewhat on the low side 20 over any potential hypotension.  I am scheduling him for follow-up echo Doppler study to reassess his LV systolic and diastolic function.  Presently he is on atorvastatin 40 mg for hyperlipidemia, and remotely had also been on fenofibrate which she is not on presently.  He is obese with a  BMI of 34.37 kg/m, and weight reduction was recommended.  I will see him in 3 months for reevaluation.  Time spent: 25 minutes  Troy Sine, MD, [facility]  05/31/2015 1:15 PM

## 2015-06-12 ENCOUNTER — Ambulatory Visit (HOSPITAL_COMMUNITY): Payer: 59 | Attending: Cardiology

## 2015-06-12 ENCOUNTER — Other Ambulatory Visit: Payer: Self-pay

## 2015-06-12 DIAGNOSIS — I517 Cardiomegaly: Secondary | ICD-10-CM | POA: Insufficient documentation

## 2015-06-12 DIAGNOSIS — I340 Nonrheumatic mitral (valve) insufficiency: Secondary | ICD-10-CM | POA: Insufficient documentation

## 2015-06-12 DIAGNOSIS — I5189 Other ill-defined heart diseases: Secondary | ICD-10-CM | POA: Diagnosis not present

## 2015-06-12 DIAGNOSIS — I429 Cardiomyopathy, unspecified: Secondary | ICD-10-CM | POA: Diagnosis not present

## 2015-06-12 DIAGNOSIS — I10 Essential (primary) hypertension: Secondary | ICD-10-CM | POA: Diagnosis not present

## 2015-06-12 DIAGNOSIS — E785 Hyperlipidemia, unspecified: Secondary | ICD-10-CM | POA: Diagnosis not present

## 2015-06-12 DIAGNOSIS — E119 Type 2 diabetes mellitus without complications: Secondary | ICD-10-CM | POA: Diagnosis not present

## 2015-06-22 ENCOUNTER — Telehealth: Payer: Self-pay | Admitting: *Deleted

## 2015-06-22 NOTE — Telephone Encounter (Signed)
Called patient to inform him of echo results and recommendations. Offered patient appointment for this Thursday . He declined stating he cannot leave work. Informed patient that we cannot give him his "card" for work until he comes in for appointment. Patient will check with his boss and call back to inform Rollene Fare if he will be coming in for the appointment that was offered.

## 2015-06-22 NOTE — Telephone Encounter (Signed)
-----   Message from Troy Sine, MD sent at 06/14/2015 12:54 PM EST ----- EF  Reduced from 2014;    Now severe global reduction in LV systolic function; grade 1 diastolic dysfunction; mild LVH; mild LAE; trace MR. F/u ov

## 2015-06-25 ENCOUNTER — Encounter: Payer: Self-pay | Admitting: Cardiovascular Disease

## 2015-06-25 ENCOUNTER — Ambulatory Visit (INDEPENDENT_AMBULATORY_CARE_PROVIDER_SITE_OTHER): Payer: 59 | Admitting: Cardiovascular Disease

## 2015-06-25 VITALS — BP 126/78 | HR 94 | Ht 72.0 in | Wt 255.9 lb

## 2015-06-25 DIAGNOSIS — E669 Obesity, unspecified: Secondary | ICD-10-CM | POA: Diagnosis not present

## 2015-06-25 DIAGNOSIS — R931 Abnormal findings on diagnostic imaging of heart and coronary circulation: Secondary | ICD-10-CM

## 2015-06-25 DIAGNOSIS — R000 Tachycardia, unspecified: Secondary | ICD-10-CM | POA: Diagnosis not present

## 2015-06-25 DIAGNOSIS — I519 Heart disease, unspecified: Secondary | ICD-10-CM | POA: Insufficient documentation

## 2015-06-25 DIAGNOSIS — E785 Hyperlipidemia, unspecified: Secondary | ICD-10-CM

## 2015-06-25 DIAGNOSIS — I10 Essential (primary) hypertension: Secondary | ICD-10-CM | POA: Diagnosis not present

## 2015-06-25 MED ORDER — CARVEDILOL 6.25 MG PO TABS: 6.2500 mg | ORAL_TABLET | Freq: Two times a day (BID) | ORAL | Status: DC

## 2015-06-25 MED ORDER — SPIRONOLACTONE 25 MG PO TABS: 12.5000 mg | ORAL_TABLET | Freq: Two times a day (BID) | ORAL | Status: DC

## 2015-06-25 NOTE — Progress Notes (Signed)
Patient ID: Brian Herring, male   DOB: June 20, 1956, 59 y.o.   MRN: [id number]      HPI: Brian Herring, is a 59 y.o. male who presents to the office for a 1 month cardiology evaluation.  Brian Herring is a former patient of Dr. Janene Madeira. 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 With medical therapy EF has improved to 45-50% range noted on echo Doppler study in April 2012. An echo Doppler study of 03/26/2013 showed EF  45-50% with diffuse hypokinesis. He had normal pulmonary pressures.    He has a history of hyperlipidemia with significant triglyceride elevation.  Laboratory prior to his last office visit revealed significant triglyceride elevation at 354 with a low HDL at 30 and markedly increased VLDL at 71 of those total cholesterol was 127 and LDL 26 consistent with an atherogenic dyslipidemia pattern. His TSH was normal at 1.978. Hemoglobin A1c was markedly elevated at 14.1 with a mean plasma glucose of 358.   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  He has remained asymptomatic.  He denies chest pain, or recent palpitations.  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 I recommended that he undergo a follow-up echo Doppler study reevaluate his LV function.  This was done on 06/12/2015 and now showed reduced ejection fraction at 25-30% with diffuse hypokinesis. There was mild LVH, mild left atrial enlargement and trace MR.  He presents for follow-up evaluation.  Past Medical History  Diagnosis Date  . Hypertension   . Hyperlipidemia   . Diabetes mellitus without complication [facility])     Past Surgical History  Procedure Laterality Date  . Shoulder surgery  01/05    Allergies  Allergen Reactions  . Sulfur Rash    Current Outpatient Prescriptions  Medication Sig Dispense Refill  . acarbose (PRECOSE) 25 MG tablet Take 1 tablet by mouth 3 (three) times daily with meals.       Marland Kitchen amLODipine (NORVASC) 5 MG tablet Take 1 tablet by mouth daily.    Marland Kitchen aspirin 81 MG tablet Take 81 mg by mouth daily.    Marland Kitchen atorvastatin (LIPITOR) 40 MG tablet Take 1 tablet by mouth daily.    Marland Kitchen glimepiride (AMARYL) 4 MG tablet Take 1 tablet by mouth daily.  1  . metFORMIN (GLUCOPHAGE) 500 MG tablet Take 2 tablets by mouth 2 (two) times daily.    . Multiple Vitamins-Minerals (MULTIVITAL PO) Take by mouth.    . ramipril (ALTACE) 10 MG capsule Take 1 capsule (10 mg total) by mouth daily. 30 capsule 7  . spironolactone (ALDACTONE) 25 MG tablet Take 0.5 tablets (12.5 mg total) by mouth 2 (two) times daily. 90 tablet 1  . carvedilol (COREG) 6.25 MG tablet Take 1 tablet (6.25 mg total) by mouth 2 (two) times daily. 180 tablet 3   No current facility-administered medications for this visit.    Social History   Social History  . Marital Status: Single    Spouse Name: N/A  . Number of Children: N/A  . Years of Education: N/A   Occupational History  . Not on file.   Social History Main Topics  . Smoking status: Never Smoker   . Smokeless tobacco: Never Used  . Alcohol Use: No  . Drug Use: Not on file  . Sexual Activity: Not on file   Other Topics Concern  .  Not on file   Social History Narrative    Family History  Problem Relation Age of Onset  . Pneumonia Mother     aspiration   Socially he is single with no children. No tobacco or alcohol use. He does not routinely exercise.  ROS General: Negative; No fevers, chills, or night sweats;  HEENT: Negative; No changes in vision or hearing, sinus congestion, difficulty swallowing Pulmonary: Negative; No cough, wheezing, shortness of breath, hemoptysis Cardiovascular: See history of present illness. GI: Negative; No nausea, vomiting, diarrhea, or abdominal pain GU: Negative; No dysuria, hematuria, or difficulty voiding Musculoskeletal: Negative; no myalgias, joint pain, or weakness Hematologic/Oncology: Negative; no easy  bruising, bleeding Endocrine: Negative; no heat/cold intolerance; no diabetes Neuro: Negative; no changes in balance, headaches Skin: Negative; No rashes or skin lesions Psychiatric: Negative; No behavioral problems, depression Sleep: Negative; No snoring, daytime sleepiness, hypersomnolence, bruxism, restless legs, hypnogognic hallucinations, no cataplexy Other comprehensive 14 point system review is negative.   PE BP 126/78 mmHg  Pulse 94  Ht 6' (1.829 m)  Wt 255 lb 14.4 oz (116.075 kg)  BMI 34.70 kg/m2   Wt Readings from Last 3 Encounters:  06/25/15 255 lb 14.4 oz (116.075 kg)  05/29/15 253 lb 8 oz (114.987 kg)  07/04/14 240 lb 14.4 oz (109.272 kg)   General: Alert, oriented, no distress.  Skin: normal turgor, no rashes HEENT: Normocephalic, atraumatic. Pupils round and reactive; sclera anicteric;no lid lag.  Nose without nasal septal hypertrophy Mouth/Parynx benign; Mallinpatti scale 3 Neck: No JVD, no carotid bruits with normal carotid upstroke Lungs: clear to ausculatation and percussion; no wheezing or rales Chest wall: Nontender to palpation Heart: RRR, s1 s2 normal faint 1/6 systolic murmur. No diastolic murmurs heard no rubs thrills or heaves Abdomen: Moderate central adiposity, nontender; no hepatosplenomehaly, BS+; abdominal aorta nontender and not dilated by palpation. Back: No CVA tenderness Pulses 2+ Extremities: no clubbing cyanosis or edema, Homan's sign negative  Neurologic: grossly nonfocal Psychological: Normal affect and mood  ECG (independently read by me):  Normal sinus rhythm at 94 bpm.  November 2016 ECG (independently read by me): Sinus rhythm at 97 bpm with an occasional PAC.  Incomplete left bundle-branch block.  February 2015 ECG (independently read by (: Normal sinus rhythm at 95 beats per minute; PR interval 160 ms, QTc interval 457 bili sec  Prior ECG of 04/02/2013: Sinus tachycardia 102 beats per minute. QTc interval 461 ms.  LABS: BMP  Latest Ref Rng 07/23/2013  Glucose 70 - 99 mg/dL 278(H)  BUN 6 - 23 mg/dL 20  Creatinine 0.50 - 1.35 mg/dL 0.76  Sodium 135 - 145 mEq/L 134(L)  Potassium 3.5 - 5.3 mEq/L 4.6  Chloride 96 - 112 mEq/L 95(L)  CO2 19 - 32 mEq/L 25  Calcium 8.4 - 10.5 mg/dL 9.2   Hepatic Function Latest Ref Rng 07/23/2013  Total Protein 6.0 - 8.3 g/dL 6.7  Albumin 3.5 - 5.2 g/dL 4.4  AST 0 - 37 U/L 48(H)  ALT 0 - 53 U/L 53  Alk Phosphatase 39 - 117 U/L 110  Total Bilirubin 0.3 - 1.2 mg/dL 0.5   CBC Latest Ref Rng 07/23/2013  WBC 4.0 - 10.5 K/uL 10.2  Hemoglobin 13.0 - 17.0 g/dL 15.8  Hematocrit 39.0 - 52.0 % 45.8  Platelets 150 - 400 K/uL 266   Lab Results  Component Value Date   MCV 86.4 07/23/2013   Lab Results  Component Value Date   TSH 1.978 07/23/2013   Lipid Panel  Component Value Date/Time   CHOL 127 07/23/2013 1603   TRIG 354* 07/23/2013 1603   HDL 30* 07/23/2013 1603   CHOLHDL 4.2 07/23/2013 1603   VLDL 71* 07/23/2013 1603   LDLCALC 26 07/23/2013 1603    RADIOLOGY: No results found.    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 His last echo Doppler study in 2014 showed a stable ejection fraction at 45-50% range, unchanged from April 2012.   When I last saw him, he was no longer taking beta blocker therapy and I elected to add Toprol to his medical regimen.  His follow-up echo Doppler study now shows significantly reduced LV function at 25-30%.  He continues to remain asymptomatic.  Specifically, there is no chest pain or shortness of breath, PND, orthopnea, or palpitations.  With his reduced EF.  I am recommending that he undergo a follow-up nuclear perfusion study to make certain this has not changed and there is no significant potential ischemia.  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  I will try to expedite his nuclear study for further evaluation.  Since his LV function is reduced, I am changing his  Toprol to carvedilol and he will start 6.25 twice a day with plans for up titration.  I am also increasing spironolactone to 12.5 mg twice a day.  I will see him in 2-3 weeks for reevaluation.  Time spent: 25 minutes  Troy Sine, MD, [facility]  06/25/2015 8:20 PM

## 2015-06-25 NOTE — Patient Instructions (Signed)
Your physician has recommended you make the following change in your medication: STOP the metoprolol. This has been replaced with carvedilol. Increase the spironolactone to 1/2 tablet twice a day. The new prescription has been sent to your pharmacy.  Your physician has requested that you have en exercise stress myoview. For further information please visit [email or web address]. Please follow instruction sheet, as given. This will be scheduled for next week.  Your physician recommends that you schedule a follow-up appointment on December 19th.

## 2015-07-01 ENCOUNTER — Telehealth (HOSPITAL_COMMUNITY): Payer: Self-pay

## 2015-07-01 NOTE — Telephone Encounter (Signed)
Encounter complete. 

## 2015-07-03 ENCOUNTER — Encounter (HOSPITAL_COMMUNITY): Payer: Self-pay | Admitting: *Deleted

## 2015-07-03 ENCOUNTER — Ambulatory Visit (HOSPITAL_COMMUNITY)
Admission: RE | Admit: 2015-07-03 | Discharge: 2015-07-03 | Disposition: A | Discharge status: Home / Self Care | Payer: 59 | Source: Ambulatory Visit | Attending: Cardiology | Admitting: Cardiology

## 2015-07-03 DIAGNOSIS — R000 Tachycardia, unspecified: Secondary | ICD-10-CM | POA: Diagnosis not present

## 2015-07-03 DIAGNOSIS — E119 Type 2 diabetes mellitus without complications: Secondary | ICD-10-CM | POA: Insufficient documentation

## 2015-07-03 DIAGNOSIS — R931 Abnormal findings on diagnostic imaging of heart and coronary circulation: Secondary | ICD-10-CM | POA: Diagnosis not present

## 2015-07-03 DIAGNOSIS — I10 Essential (primary) hypertension: Secondary | ICD-10-CM | POA: Diagnosis not present

## 2015-07-03 DIAGNOSIS — R0609 Other forms of dyspnea: Secondary | ICD-10-CM | POA: Insufficient documentation

## 2015-07-03 DIAGNOSIS — R9439 Abnormal result of other cardiovascular function study: Secondary | ICD-10-CM | POA: Insufficient documentation

## 2015-07-03 DIAGNOSIS — I517 Cardiomegaly: Secondary | ICD-10-CM | POA: Insufficient documentation

## 2015-07-03 LAB — MYOCARDIAL PERFUSION IMAGING
CHL CUP NUCLEAR SDS: 0
LV sys vol: 163 mL
LVDIAVOL: 214 mL
NUC STRESS TID: 1.2
Peak HR: 103 {beats}/min
Rest HR: 90 {beats}/min
SRS: 6
SSS: 6

## 2015-07-03 MED ORDER — TECHNETIUM TC 99M SESTAMIBI GENERIC - CARDIOLITE
10.5000 | Freq: Once | INTRAVENOUS | Status: AC | PRN
  Administered 2015-07-03: 11 via INTRAVENOUS

## 2015-07-03 MED ORDER — TECHNETIUM TC 99M SESTAMIBI GENERIC - CARDIOLITE
31.8000 | Freq: Once | INTRAVENOUS | Status: AC | PRN
  Administered 2015-07-03: 31.8 via INTRAVENOUS

## 2015-07-03 MED ORDER — REGADENOSON 0.4 MG/5ML IV SOLN
0.4000 mg | Freq: Once | INTRAVENOUS | Status: AC
  Administered 2015-07-03: 0.4 mg via INTRAVENOUS

## 2015-07-03 NOTE — Progress Notes (Unsigned)
Patient ID: Brian Herring, male   DOB: 07-Sep-1955, 59 y.o.   MRN: [id number] Patient was changed to a Lexiscan d/t LBBB

## 2015-07-07 NOTE — Progress Notes (Signed)
Appointment scheduled on Monday 07/13/15

## 2015-07-13 ENCOUNTER — Ambulatory Visit (INDEPENDENT_AMBULATORY_CARE_PROVIDER_SITE_OTHER): Payer: 59 | Admitting: Cardiovascular Disease

## 2015-07-13 ENCOUNTER — Encounter: Payer: Self-pay | Admitting: Cardiovascular Disease

## 2015-07-13 VITALS — BP 128/70 | HR 84 | Ht 72.0 in | Wt 256.5 lb

## 2015-07-13 DIAGNOSIS — R9439 Abnormal result of other cardiovascular function study: Secondary | ICD-10-CM

## 2015-07-13 DIAGNOSIS — I519 Heart disease, unspecified: Secondary | ICD-10-CM

## 2015-07-13 DIAGNOSIS — R5383 Other fatigue: Secondary | ICD-10-CM | POA: Diagnosis not present

## 2015-07-13 DIAGNOSIS — R931 Abnormal findings on diagnostic imaging of heart and coronary circulation: Secondary | ICD-10-CM

## 2015-07-13 DIAGNOSIS — E119 Type 2 diabetes mellitus without complications: Secondary | ICD-10-CM

## 2015-07-13 DIAGNOSIS — I420 Dilated cardiomyopathy: Secondary | ICD-10-CM

## 2015-07-13 DIAGNOSIS — Z794 Long term (current) use of insulin: Secondary | ICD-10-CM

## 2015-07-13 DIAGNOSIS — E785 Hyperlipidemia, unspecified: Secondary | ICD-10-CM

## 2015-07-13 DIAGNOSIS — I10 Essential (primary) hypertension: Secondary | ICD-10-CM | POA: Diagnosis not present

## 2015-07-13 DIAGNOSIS — I428 Other cardiomyopathies: Secondary | ICD-10-CM | POA: Insufficient documentation

## 2015-07-13 MED ORDER — SPIRONOLACTONE 25 MG PO TABS: 25.0000 mg | ORAL_TABLET | Freq: Two times a day (BID) | ORAL | Status: DC

## 2015-07-13 MED ORDER — CARVEDILOL 12.5 MG PO TABS: 12.5000 mg | ORAL_TABLET | Freq: Two times a day (BID) | ORAL | Status: DC

## 2015-07-13 NOTE — Patient Instructions (Addendum)
Your physician has recommended you make the following change in your medication:   1.) the carvedilol has been increased to 12.5 mg twice a day. A new prescription has been sent to your  Pharmacy.  2.) the spironalactone has been increased to 25 mg twice a day.  Your physician recommends that you return for lab work fasting.  Your physician recommends that you schedule a follow-up appointment in: 4 weeks with dr Claiborne Billings.

## 2015-07-13 NOTE — Progress Notes (Signed)
Patient ID: Brian Herring, male   DOB: Dec 01, 1955, 59 y.o.   MRN: [id number]      HPI: Brian Herring, is a 59 y.o. male who presents to the office for a 1 month cardiology evaluation.  Brian Herring is a former patient of Dr. Janene Herring. 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 With medical therapy EF has improved to 45-50% range noted on echo Doppler study in April 2012. An echo Doppler study of 03/26/2013 showed EF 45-50% with diffuse hypokinesis. He had normal pulmonary pressures.    He has a history of hyperlipidemia with significant triglyceride elevation.  Laboratory prior to his last office visit revealed significant triglyceride elevation at 354 with a low HDL at 30 and markedly increased VLDL at 71 of those total cholesterol was 127 and LDL 26 consistent with an atherogenic dyslipidemia pattern. His TSH was normal at 1.978. Hemoglobin A1c was markedly elevated at 14.1 with a mean plasma glucose of 358.   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 He has remained asymptomatic.  He denies chest pain, or recent palpitations.  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 I recommended that he undergo a follow-up echo Doppler study reevaluate his LV function.  This was done on 06/12/2015 and showed reduced ejection fraction at 25-30% with diffuse hypokinesis. There was mild LVH, mild left atrial enlargement and trace MR.    Since I last saw him, I scheduled him to undergo a nuclear perfusion study to assess his LV function and myocardial perfusion.  This was done on 07/03/2015 and confirms severe LV dysfunction with a nuclear stress ejection fraction at 24%. There was a small defect of mild severity in the inferior wall and also apex.  He was felt to have diaphragmatic attenuation.  The LV was dilated any his LV dysfunction was presumed to be of nonischemic etiology. He denies  chest pain.  He admits to getting shortness of breath with significant activity but not with routine work.  He still able to pick up heavy glass and perform well in lifting this.  However, over the past year, he admits that he is not exercising as he had in the past.  He presents for evaluation  Past Medical History  Diagnosis Date  . Hypertension   . Hyperlipidemia   . Diabetes mellitus without complication [facility])     Past Surgical History  Procedure Laterality Date  . Shoulder surgery  01/05    Allergies  Allergen Reactions  . Sulfur Rash    Current Outpatient Prescriptions  Medication Sig Dispense Refill  . acarbose (PRECOSE) 25 MG tablet Take 1 tablet by mouth 3 (three) times daily with meals.     Marland Kitchen amLODipine (NORVASC) 5 MG tablet Take 1 tablet by mouth daily.    Marland Kitchen aspirin 81 MG tablet Take 81 mg by mouth daily.    Marland Kitchen atorvastatin (LIPITOR) 40 MG tablet Take 1 tablet by mouth daily.    Marland Kitchen glimepiride (AMARYL) 4 MG tablet Take 1 tablet by mouth daily.  1  . metFORMIN (GLUCOPHAGE) 500 MG tablet Take 2 tablets by mouth 2 (two) times daily.    . Multiple Vitamins-Minerals (MULTIVITAL PO) Take by mouth.    . ramipril (ALTACE) 10 MG capsule Take 1 capsule (10 mg total) by mouth daily. 30 capsule 7  .  spironolactone (ALDACTONE) 25 MG tablet Take 1 tablet (25 mg total) by mouth 2 (two) times daily. 60 tablet 11  . carvedilol (COREG) 12.5 MG tablet Take 1 tablet (12.5 mg total) by mouth 2 (two) times daily. 60 tablet 11   No current facility-administered medications for this visit.    Social History   Social History  . Marital Status: Single    Spouse Name: N/A  . Number of Children: N/A  . Years of Education: N/A   Occupational History  . Not on file.   Social History Main Topics  . Smoking status: Never Smoker   . Smokeless tobacco: Never Used  . Alcohol Use: No  . Drug Use: Not on file  . Sexual Activity: Not on file   Other Topics Concern  . Not on file   Social  History Narrative    Family History  Problem Relation Age of Onset  . Pneumonia Mother     aspiration   Socially he is single with no children. No tobacco or alcohol use. He does not routinely exercise.  ROS General: Negative; No fevers, chills, or night sweats;  HEENT: Negative; No changes in vision or hearing, sinus congestion, difficulty swallowing Pulmonary: Negative; No cough, wheezing, shortness of breath, hemoptysis Cardiovascular: See history of present illness. GI: Negative; No nausea, vomiting, diarrhea, or abdominal pain GU: Negative; No dysuria, hematuria, or difficulty voiding Musculoskeletal: Negative; no myalgias, joint pain, or weakness Hematologic/Oncology: Negative; no easy bruising, bleeding Endocrine: Negative; no heat/cold intolerance; no diabetes Neuro: Negative; no changes in balance, headaches Skin: Negative; No rashes or skin lesions Psychiatric: Negative; No behavioral problems, depression Sleep: Negative; No snoring, daytime sleepiness, hypersomnolence, bruxism, restless legs, hypnogognic hallucinations, no cataplexy Other comprehensive 14 point system review is negative.   PE BP 128/70 mmHg  Pulse 84  Ht 6' (1.829 m)  Wt 256 lb 8 oz (116.348 kg)  BMI 34.78 kg/m2   Wt Readings from Last 3 Encounters:  07/13/15 256 lb 8 oz (116.348 kg)  07/03/15 255 lb (115.667 kg)  06/25/15 255 lb 14.4 oz (116.075 kg)   General: Alert, oriented, no distress.  Skin: normal turgor, no rashes HEENT: Normocephalic, atraumatic. Pupils round and reactive; sclera anicteric;no lid lag.  Nose without nasal septal hypertrophy Mouth/Parynx benign; Mallinpatti scale 3 Neck: No JVD, no carotid bruits with normal carotid upstroke Lungs: clear to ausculatation and percussion; no wheezing or rales Chest wall: Nontender to palpation Heart: RRR, s1 s2 normal faint 1/6 systolic murmur. No diastolic murmurs heard no rubs thrills or heaves Abdomen: Moderate central adiposity,  nontender; no hepatosplenomehaly, BS+; abdominal aorta nontender and not dilated by palpation. Back: No CVA tenderness Pulses 2+ Extremities: no clubbing cyanosis or edema, Homan's sign negative  Neurologic: grossly nonfocal Psychological: Normal affect and mood  06/25/15 ECG (independently read by me):  Normal sinus rhythm at 94 bpm.  November 2016 ECG (independently read by me): Sinus rhythm at 97 bpm with an occasional PAC.  Incomplete left bundle-branch block.  February 2015 ECG (independently read by (: Normal sinus rhythm at 95 beats per minute; PR interval 160 ms, QTc interval 457 bili sec  Prior ECG of 04/02/2013: Sinus tachycardia 102 beats per minute. QTc interval 461 ms.  LABS: BMP Latest Ref Rng 07/23/2013  Glucose 70 - 99 mg/dL 278(H)  BUN 6 - 23 mg/dL 20  Creatinine 0.50 - 1.35 mg/dL 0.76  Sodium 135 - 145 mEq/L 134(L)  Potassium 3.5 - 5.3 mEq/L 4.6  Chloride 96 -  112 mEq/L 95(L)  CO2 19 - 32 mEq/L 25  Calcium 8.4 - 10.5 mg/dL 9.2   Hepatic Function Latest Ref Rng 07/23/2013  Total Protein 6.0 - 8.3 g/dL 6.7  Albumin 3.5 - 5.2 g/dL 4.4  AST 0 - 37 U/L 48(H)  ALT 0 - 53 U/L 53  Alk Phosphatase 39 - 117 U/L 110  Total Bilirubin 0.3 - 1.2 mg/dL 0.5   CBC Latest Ref Rng 07/23/2013  WBC 4.0 - 10.5 K/uL 10.2  Hemoglobin 13.0 - 17.0 g/dL 15.8  Hematocrit 39.0 - 52.0 % 45.8  Platelets 150 - 400 K/uL 266   Lab Results  Component Value Date   MCV 86.4 07/23/2013   Lab Results  Component Value Date   TSH 1.978 07/23/2013   Lipid Panel     Component Value Date/Time   CHOL 127 07/23/2013 1603   TRIG 354* 07/23/2013 1603   HDL 30* 07/23/2013 1603   CHOLHDL 4.2 07/23/2013 1603   VLDL 71* 07/23/2013 1603   LDLCALC 26 07/23/2013 1603    RADIOLOGY: No results found.    ASSESSMENT AND PLAN: Mr. Kiril Hippe is a 59 years old with a previous history of a presumed viral cardiomyopathy.  He tells me he underwent cardiac catheterization at that time and was  told not having any blockage.  His LV function had improved from 25% to 45-50% when checked in 2014 and was unchanged from 2012.   When I last saw him, he was no longer taking beta blocker therapy and I elected to add Toprol to his medical regimen.  His follow-up echo Doppler study demonstrated recurrent significantly reduced LV function at 25-30%.  He continues to remain asymptomatic.  Specifically, there is no chest pain or shortness of breath, PND, orthopnea, or palpitations.  With his reduced EF  I changed his Toprol to carvedilol and initiated this at 6.25 twice a day and titrated his spironolactone to 12.5 mm grams twice a day.  I reviewed his nuclear perfusion study in detail with him.  His LV is dilated.  He does have reduced perfusion inferiorly and apically, which may be contributed by diaphragmatic attenuation.  Nuclear ejection fraction was 24%.  I discussed the importance of consideration for definitive repeat cardiac catheterization.  Presently, he would like to defer this for some time.  I am further titrating his carvedilol to 12.5 mg twice a day and will also titrate his spironolactone to 25 mg twice a day.  Complete set of bloodwork will be obtained in the fasting state consisting of a CBC, Cmet, TSH, HbA1c and lipid studies.  I will see him in the office in 3-4 weeks for follow-up evaluation and at that time further discussion concerning possible repeat cardiac catheterization will be undertaken as well as potential additional medication titration.  Time spent: 25 minutes  Troy Sine, MD, [facility]  07/13/2015 11:45 AM

## 2015-08-12 ENCOUNTER — Telehealth: Payer: Self-pay | Admitting: Cardiovascular Disease

## 2015-08-12 MED ORDER — SPIRONOLACTONE 25 MG PO TABS: 25.0000 mg | ORAL_TABLET | Freq: Two times a day (BID) | ORAL | Status: DC

## 2015-08-12 NOTE — Telephone Encounter (Signed)
Brian Herring is calling because he is needing a new prescription sent for Spironolactone 25mg  one tablet twice a day , [facility] on [address] .  Thanks

## 2015-08-12 NOTE — Telephone Encounter (Signed)
Called patient to inform him that his Spironolactone as reordered and sent to [place]; left message prescription was called in and sent to pharmacy.

## 2015-08-14 ENCOUNTER — Other Ambulatory Visit: Payer: Self-pay | Admitting: Cardiovascular Disease

## 2015-08-14 LAB — CBC
HCT: 45.3 % (ref 39.0–52.0)
HEMOGLOBIN: 15.7 g/dL (ref 13.0–17.0)
MCH: 31.3 pg (ref 26.0–34.0)
MCHC: 34.7 g/dL (ref 30.0–36.0)
MCV: 90.2 fL (ref 78.0–100.0)
MPV: 10.2 fL (ref 8.6–12.4)
PLATELETS: 224 10*3/uL (ref 150–400)
RBC: 5.02 MIL/uL (ref 4.22–5.81)
RDW: 13.4 % (ref 11.5–15.5)
WBC: 8.7 10*3/uL (ref 4.0–10.5)

## 2015-08-14 LAB — LIPID PANEL
CHOLESTEROL: 169 mg/dL (ref 125–200)
HDL: 32 mg/dL — ABNORMAL LOW (ref >=40)
LDL Cholesterol: 92 mg/dL (ref <130)
TRIGLYCERIDES: 226 mg/dL — AB (ref <150)
Total CHOL/HDL Ratio: 5.3 Ratio — ABNORMAL HIGH (ref <=5.0)
VLDL: 45 mg/dL — ABNORMAL HIGH (ref <30)

## 2015-08-14 LAB — COMPREHENSIVE METABOLIC PANEL
ALBUMIN: 4.5 g/dL (ref 3.6–5.1)
ALK PHOS: 77 U/L (ref 40–115)
ALT: 28 U/L (ref 9–46)
AST: 18 U/L (ref 10–35)
BUN: 20 mg/dL (ref 7–25)
CO2: 28 mmol/L (ref 20–31)
Calcium: 9.5 mg/dL (ref 8.6–10.3)
Chloride: 100 mmol/L (ref 98–110)
Creat: 0.77 mg/dL (ref 0.70–1.33)
Glucose, Bld: 237 mg/dL — ABNORMAL HIGH (ref 65–99)
Potassium: 4.9 mmol/L (ref 3.5–5.3)
SODIUM: 136 mmol/L (ref 135–146)
TOTAL PROTEIN: 7 g/dL (ref 6.1–8.1)
Total Bilirubin: 0.7 mg/dL (ref 0.2–1.2)

## 2015-08-14 LAB — HEMOGLOBIN A1C
HEMOGLOBIN A1C: 8.8 % — AB (ref <5.7)
Mean Plasma Glucose: 206 mg/dL — ABNORMAL HIGH (ref <117)

## 2015-08-14 LAB — TSH: TSH: 1.75 u[IU]/mL (ref 0.350–4.500)

## 2015-08-14 MED ORDER — SPIRONOLACTONE 25 MG PO TABS: 25.0000 mg | ORAL_TABLET | Freq: Two times a day (BID) | ORAL | Status: DC

## 2015-08-14 NOTE — Telephone Encounter (Signed)
°*  STAT* If patient is at the pharmacy, call can be transferred to refill team.   1. Which medications need to be refilled? (please list name of each medication and dose if known) Spironolactone  2. Which pharmacy/location (including street and city if local pharmacy) [phone]  3. Do they need a 30 day or 90 day supply? 30 and refills

## 2015-08-14 NOTE — Telephone Encounter (Signed)
Rx(s) sent to pharmacy electronically.  

## 2015-08-20 ENCOUNTER — Ambulatory Visit (INDEPENDENT_AMBULATORY_CARE_PROVIDER_SITE_OTHER): Payer: 59 | Admitting: Cardiovascular Disease

## 2015-08-20 ENCOUNTER — Encounter: Payer: Self-pay | Admitting: Cardiovascular Disease

## 2015-08-20 VITALS — BP 100/70 | HR 88 | Ht 72.0 in | Wt 253.0 lb

## 2015-08-20 DIAGNOSIS — E669 Obesity, unspecified: Secondary | ICD-10-CM

## 2015-08-20 DIAGNOSIS — E1165 Type 2 diabetes mellitus with hyperglycemia: Secondary | ICD-10-CM

## 2015-08-20 DIAGNOSIS — I255 Ischemic cardiomyopathy: Secondary | ICD-10-CM

## 2015-08-20 DIAGNOSIS — E785 Hyperlipidemia, unspecified: Secondary | ICD-10-CM

## 2015-08-20 DIAGNOSIS — I519 Heart disease, unspecified: Secondary | ICD-10-CM

## 2015-08-20 DIAGNOSIS — I10 Essential (primary) hypertension: Secondary | ICD-10-CM

## 2015-08-20 MED ORDER — CARVEDILOL 25 MG PO TABS: 25.0000 mg | ORAL_TABLET | Freq: Two times a day (BID) | ORAL | Status: DC

## 2015-08-20 MED ORDER — SPIRONOLACTONE 25 MG PO TABS: 25.0000 mg | ORAL_TABLET | Freq: Two times a day (BID) | ORAL | Status: DC

## 2015-08-20 MED ORDER — ATORVASTATIN CALCIUM 80 MG PO TABS: 80.0000 mg | ORAL_TABLET | Freq: Every day | ORAL | Status: DC

## 2015-08-20 NOTE — Patient Instructions (Signed)
Your physician has recommended you make the following change in your medication:   1.) take 1 & 1/2 tablet twice a day of the 12.5 mg carvedilol tablets for 3 weeks. After 3 weeks start new prescription for 25 mg tablets twice a day.  2.) the atorvastatin has been increased to 80 mg from 40 mg.  Your physician recommends that you return for lab work fasting in 6 weeks.  Your physician has requested that you have an echocardiogram. Echocardiography is a painless test that uses sound waves to create images of your heart. It provides your doctor with information about the size and shape of your heart and how well your heart's chambers and valves are working. This procedure takes approximately one hour. There are no restrictions for this procedure.  Your physician recommends that you schedule a follow-up appointment in: 2 months with Dr Claiborne Billings.

## 2015-08-24 ENCOUNTER — Encounter: Payer: Self-pay | Admitting: Cardiovascular Disease

## 2015-08-24 NOTE — Progress Notes (Signed)
Patient ID: Brian Herring, male   DOB: 01/19/1956, 60 y.o.   MRN: [id number]   Primary MD: Dr. Melinda Crutch  HPI: Brian Herring is a 60 y.o. male who presents to the office for a 1 month cardiology evaluation.  Brian Herring is a former patient of Dr. Janene Madeira. 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 With medical therapy EF has improved to 45-50% range noted on echo Doppler study in April 2012. An echo Doppler study of 03/26/2013 showed EF 45-50% with diffuse hypokinesis. He had normal pulmonary pressures.    He has a history of hyperlipidemia with significant triglyceride elevation.  Laboratory prior to his last office visit revealed significant triglyceride elevation at 354 with a low HDL at 30 and markedly increased VLDL at 71 of those total cholesterol was 127 and LDL 26 consistent with an atherogenic dyslipidemia pattern. His TSH was normal at 1.978. Hemoglobin A1c was markedly elevated at 14.1 with a mean plasma glucose of 358.   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 He has remained asymptomatic.  He denies chest pain, or recent palpitations.  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 I recommended that he undergo a follow-up echo Doppler study reevaluate his LV function.  This was done on 06/12/2015 and showed reduced ejection fraction at 25-30% with diffuse hypokinesis. There was mild LVH, mild left atrial enlargement and trace MR.    Since I last saw him, I scheduled him to undergo a nuclear perfusion study to assess his LV function and myocardial perfusion.  This was done on 07/03/2015 and confirms severe LV dysfunction with a nuclear stress ejection fraction at 24%. There was a small defect of mild severity in the inferior wall and also apex.  He was felt to have diaphragmatic attenuation.  The LV was dilated any his LV dysfunction was presumed to be of nonischemic  etiology. He denies chest pain.  He admits to getting shortness of breath with significant activity but not with routine work.  He still able to pick up heavy glass and perform well in lifting this.  However, over the past year, he admits that he is not exercising as he had in the past.   He has not been working since he has not had his DOT renewed based on Cardiologic evaluation.  When I saw him  overone month ago, I changed his Toprol to carvedilol and initially did this at 6.25 mg twice a day and titrated his spironolactone to 12.5 mg twice a day.  At his last visit, carvedilol was further increased to 12.5 mg twice a day.  He had follow-up laboratory.  His potassium was 4.9.  Glucose was increased at 237.  TSH was normal.  Hemoglobin A1c was increased at 8.8.  Lipid studies revealed elevation of triglycerides at 226 with a low HDL of 32, and increased VLDL at 45 suggestive of an atherogenic dyslipidemic pattern.  Total cholesterol was 169 with LDL of 92.  He presents for reevaluation.  Past Medical History  Diagnosis Date  . Hypertension   . Hyperlipidemia   . Diabetes mellitus without complication [facility])     Past Surgical History  Procedure Laterality Date  . Shoulder surgery  01/05    Allergies  Allergen Reactions  . Sulfur Rash    Current Outpatient Prescriptions  Medication Sig  Dispense Refill  . acarbose (PRECOSE) 25 MG tablet Take 1 tablet by mouth 3 (three) times daily with meals.     Marland Kitchen amLODipine (NORVASC) 5 MG tablet Take 1 tablet by mouth daily.    Marland Kitchen aspirin 81 MG tablet Take 81 mg by mouth daily.    Marland Kitchen glimepiride (AMARYL) 4 MG tablet Take 1 tablet by mouth daily.  1  . metFORMIN (GLUCOPHAGE) 500 MG tablet Take 2 tablets by mouth 2 (two) times daily.    . Multiple Vitamins-Minerals (MULTIVITAL PO) Take by mouth.    . ramipril (ALTACE) 10 MG capsule Take 1 capsule (10 mg total) by mouth daily. 30 capsule 7  . spironolactone (ALDACTONE) 25 MG tablet Take 1 tablet (25 mg  total) by mouth 2 (two) times daily. 60 tablet 11  . atorvastatin (LIPITOR) 80 MG tablet Take 1 tablet (80 mg total) by mouth daily. 90 tablet 3  . carvedilol (COREG) 25 MG tablet Take 1 tablet (25 mg total) by mouth 2 (two) times daily. 60 tablet 6   No current facility-administered medications for this visit.    Social History   Social History  . Marital Status: Single    Spouse Name: N/A  . Number of Children: N/A  . Years of Education: N/A   Occupational History  . Not on file.   Social History Main Topics  . Smoking status: Never Smoker   . Smokeless tobacco: Never Used  . Alcohol Use: No  . Drug Use: Not on file  . Sexual Activity: Not on file   Other Topics Concern  . Not on file   Social History Narrative    Family History  Problem Relation Age of Onset  . Pneumonia Mother     aspiration   Socially he is single with no children. No tobacco or alcohol use. He does not routinely exercise.  ROS General: Negative; No fevers, chills, or night sweats;  HEENT: Negative; No changes in vision or hearing, sinus congestion, difficulty swallowing Pulmonary: Negative; No cough, wheezing, shortness of breath, hemoptysis Cardiovascular: See history of present illness. GI: Negative; No nausea, vomiting, diarrhea, or abdominal pain GU: Negative; No dysuria, hematuria, or difficulty voiding Musculoskeletal: Negative; no myalgias, joint pain, or weakness Hematologic/Oncology: Negative; no easy bruising, bleeding Endocrine: Negative; no heat/cold intolerance; no diabetes Neuro: Negative; no changes in balance, headaches Skin: Negative; No rashes or skin lesions Psychiatric: Negative; No behavioral problems, depression Sleep: Negative; No snoring, daytime sleepiness, hypersomnolence, bruxism, restless legs, hypnogognic hallucinations, no cataplexy Other comprehensive 14 point system review is negative.   PE BP 100/70 mmHg  Pulse 88  Ht 6' (1.829 m)  Wt 253 lb (114.76 kg)   BMI 34.31 kg/m2   Wt Readings from Last 3 Encounters:  08/20/15 253 lb (114.76 kg)  07/13/15 256 lb 8 oz (116.348 kg)  07/03/15 255 lb (115.667 kg)   General: Alert, oriented, no distress.  Skin: normal turgor, no rashes HEENT: Normocephalic, atraumatic. Pupils round and reactive; sclera anicteric;no lid lag.  Nose without nasal septal hypertrophy Mouth/Parynx benign; Mallinpatti scale 3 Neck: No JVD, no carotid bruits with normal carotid upstroke Lungs: clear to ausculatation and percussion; no wheezing or rales Chest wall: Nontender to palpation Heart: RRR, s1 s2 normal faint 1/6 systolic murmur. No diastolic murmurs heard no rubs thrills or heaves Abdomen: Moderate central adiposity, nontender; no hepatosplenomehaly, BS+; abdominal aorta nontender and not dilated by palpation. Back: No CVA tenderness Pulses 2+ Extremities: no clubbing cyanosis or edema, Homan's sign  negative  Neurologic: grossly nonfocal Psychological: Normal affect and mood  ECG (independently read by me): Normal sinus rhythm at 87 bpm.  QTc interval 457 ms.  An isolated PVC.  06/25/15 ECG (independently read by me):  Normal sinus rhythm at 94 bpm.  November 2016 ECG (independently read by me): Sinus rhythm at 97 bpm with an occasional PAC.  Incomplete left bundle-branch block.  February 2015 ECG (independently read by (: Normal sinus rhythm at 95 beats per minute; PR interval 160 ms, QTc interval 457 bili sec  Prior ECG of 04/02/2013: Sinus tachycardia 102 beats per minute. QTc interval 461 ms.  LABS: BMP Latest Ref Rng 08/14/2015 07/23/2013  Glucose 65 - 99 mg/dL 237(H) 278(H)  BUN 7 - 25 mg/dL 20 20  Creatinine 0.70 - 1.33 mg/dL 0.77 0.76  Sodium 135 - 146 mmol/L 136 134(L)  Potassium 3.5 - 5.3 mmol/L 4.9 4.6  Chloride 98 - 110 mmol/L 100 95(L)  CO2 20 - 31 mmol/L 28 25  Calcium 8.6 - 10.3 mg/dL 9.5 9.2   Hepatic Function Latest Ref Rng 08/14/2015 07/23/2013  Total Protein 6.1 - 8.1 g/dL 7.0 6.7    Albumin 3.6 - 5.1 g/dL 4.5 4.4  AST 10 - 35 U/L 18 48(H)  ALT 9 - 46 U/L 28 53  Alk Phosphatase 40 - 115 U/L 77 110  Total Bilirubin 0.2 - 1.2 mg/dL 0.7 0.5   CBC Latest Ref Rng 08/14/2015 07/23/2013  WBC 4.0 - 10.5 K/uL 8.7 10.2  Hemoglobin 13.0 - 17.0 g/dL 15.7 15.8  Hematocrit 39.0 - 52.0 % 45.3 45.8  Platelets 150 - 400 K/uL 224 266   Lab Results  Component Value Date   MCV 90.2 08/14/2015   MCV 86.4 07/23/2013   Lab Results  Component Value Date   TSH 1.750 08/14/2015   Lipid Panel     Component Value Date/Time   CHOL 169 08/14/2015 0942   TRIG 226* 08/14/2015 0942   HDL 32* 08/14/2015 0942   CHOLHDL 5.3* 08/14/2015 0942   VLDL 45* 08/14/2015 0942   LDLCALC 92 08/14/2015 0942    RADIOLOGY: No results found.    ASSESSMENT AND PLAN: Brian Herring is a 60 years old with a previous history of a presumed viral cardiomyopathy.  Cardiac catheterization at that time apparently did not reveal significant obstructive disease.  His LV function had improved from 25% to 45-50% when checked in 2014 and was unchanged from 2012.     His recent follow-up echo Doppler study demonstrated recurrent significantly reduced LV function at 25-30%.  He continues to remain asymptomatic.  Specifically, there is no chest pain or shortness of breath, PND, orthopnea, or palpitations.  With his reduced EF  I changed his Toprol to carvedilol and initiated Doster on blockade.  He has been titrated to carvedilol 12.5 g twice a day and spironolactone 12.5 mg twice a day.  I'm now recommending further slight increase of carvedilol to 18.75 mg twice a day for 3 weeks and he will then increase this to 25 mg twice a day.  I will also further titrate his spironolactone to 25 g twice a day.  With reference to his lipid studies.  I'm further titrating his atorvastatin to 80 mg daily.  On his increased medical regimen he will undergo a follow-up echo Doppler study in 6 weeks to see if there has been improvement  in LV function.  In the past, we had discussed the possibility of repeat cardiac catheterization.  He  continues to be symptom free on his medical regimen.  I will see him in the office in 6-8 weeks for follow up evaluation and further recommendations will be made at that time.  Time spent: 25 minutes  Troy Sine, MD, [facility]  08/24/2015 5:33 PM

## 2015-09-02 ENCOUNTER — Ambulatory Visit (HOSPITAL_COMMUNITY): Payer: 59 | Attending: Cardiovascular Disease

## 2015-09-02 ENCOUNTER — Other Ambulatory Visit: Payer: Self-pay

## 2015-09-02 DIAGNOSIS — I517 Cardiomegaly: Secondary | ICD-10-CM | POA: Insufficient documentation

## 2015-09-02 DIAGNOSIS — E785 Hyperlipidemia, unspecified: Secondary | ICD-10-CM | POA: Diagnosis not present

## 2015-09-02 DIAGNOSIS — I10 Essential (primary) hypertension: Secondary | ICD-10-CM | POA: Diagnosis not present

## 2015-09-02 DIAGNOSIS — Z6834 Body mass index (BMI) 34.0-34.9, adult: Secondary | ICD-10-CM | POA: Insufficient documentation

## 2015-09-02 DIAGNOSIS — E669 Obesity, unspecified: Secondary | ICD-10-CM | POA: Insufficient documentation

## 2015-09-02 DIAGNOSIS — E119 Type 2 diabetes mellitus without complications: Secondary | ICD-10-CM | POA: Insufficient documentation

## 2015-09-02 DIAGNOSIS — R29898 Other symptoms and signs involving the musculoskeletal system: Secondary | ICD-10-CM | POA: Diagnosis not present

## 2015-09-02 DIAGNOSIS — I255 Ischemic cardiomyopathy: Secondary | ICD-10-CM

## 2015-09-10 ENCOUNTER — Telehealth: Payer: Self-pay | Admitting: Cardiovascular Disease

## 2015-09-10 NOTE — Telephone Encounter (Signed)
New message     FYI Calling to let the nurse know that he is bringing a disability [facility]) form by today for the doctor to complete

## 2015-09-10 NOTE — Telephone Encounter (Signed)
SPOKE TO PATIENT INFORMED TO CONTACT MEDICAL RECORDS DEPT (HERE AT [facility] OFFICE) - TO START PROCESS OF HAVING FORM FILLED. PATIENT IS AWARE DR Claiborne Billings IS NOT IN THE OFFICE THIS WEEK. VERBALIZED UNDERSTANDING.

## 2015-09-11 ENCOUNTER — Telehealth: Payer: Self-pay | Admitting: Cardiovascular Disease

## 2015-09-11 ENCOUNTER — Ambulatory Visit: Payer: 59 | Admitting: Cardiovascular Disease

## 2015-09-11 NOTE — Telephone Encounter (Signed)
2.16.17 Patient brought [facility] forms to office for Dr Claiborne Billings to complete.  Received AUTH/PMT/'AFLAC Forms from patient.  Sent to [employer] for processing on 09/11/15. lp

## 2015-09-11 NOTE — Addendum Note (Signed)
Addended by: Diana Eves on: 09/11/2015 11:41 AM   Modules accepted: Orders

## 2015-09-16 ENCOUNTER — Telehealth: Payer: Self-pay | Admitting: Cardiovascular Disease

## 2015-09-16 DIAGNOSIS — B3324 Viral cardiomyopathy: Secondary | ICD-10-CM

## 2015-09-16 MED ORDER — ATORVASTATIN CALCIUM 80 MG PO TABS: 80.0000 mg | ORAL_TABLET | Freq: Every day | ORAL | Status: DC

## 2015-09-16 MED ORDER — RAMIPRIL 10 MG PO CAPS: 10.0000 mg | ORAL_CAPSULE | Freq: Every day | ORAL | Status: DC

## 2015-09-16 MED ORDER — SPIRONOLACTONE 25 MG PO TABS: 25.0000 mg | ORAL_TABLET | Freq: Two times a day (BID) | ORAL | Status: DC

## 2015-09-16 MED ORDER — CARVEDILOL 25 MG PO TABS: 25.0000 mg | ORAL_TABLET | Freq: Two times a day (BID) | ORAL | Status: DC

## 2015-09-16 MED ORDER — AMLODIPINE BESYLATE 5 MG PO TABS: 5.0000 mg | ORAL_TABLET | Freq: Every day | ORAL | Status: DC

## 2015-09-16 NOTE — Telephone Encounter (Signed)
Refills sent

## 2015-09-16 NOTE — Telephone Encounter (Signed)
New message      *STAT* If patient is at the pharmacy, call can be transferred to refill team.   1. Which medications need to be refilled? (please list name of each medication and dose if known) amlodipine 5mg , atorvastatin 80mg , carvedilol 25mg , spironolactone 25mg  and ramipril 10mg  2. Which pharmacy/location (including street and city if local pharmacy) is medication to be sent to? [email or web address]  3. Do they need a 30 day or 90 day supply? 90 day supply----pt will be without ins at the end of the month

## 2015-09-17 ENCOUNTER — Telehealth: Payer: Self-pay | Admitting: Cardiovascular Disease

## 2015-09-17 NOTE — Telephone Encounter (Signed)
Received [facility] Physicians Statement back from [facility] @ [place] for [place] to review, complete and sign.  Given to Alphonsa Gin for Dr Claiborne Billings to sign. lp

## 2015-09-22 LAB — LIPID PANEL
Cholesterol: 118 mg/dL — ABNORMAL LOW (ref 125–200)
HDL: 30 mg/dL — ABNORMAL LOW (ref >=40)
LDL CALC: 47 mg/dL (ref <130)
Total CHOL/HDL Ratio: 3.9 Ratio (ref <=5.0)
Triglycerides: 203 mg/dL — ABNORMAL HIGH (ref <150)
VLDL: 41 mg/dL — AB (ref <30)

## 2015-09-22 LAB — CBC
HCT: 44.6 % (ref 39.0–52.0)
HEMOGLOBIN: 14.9 g/dL (ref 13.0–17.0)
MCH: 29.7 pg (ref 26.0–34.0)
MCHC: 33.4 g/dL (ref 30.0–36.0)
MCV: 89 fL (ref 78.0–100.0)
MPV: 11 fL (ref 8.6–12.4)
PLATELETS: 258 10*3/uL (ref 150–400)
RBC: 5.01 MIL/uL (ref 4.22–5.81)
RDW: 13.8 % (ref 11.5–15.5)
WBC: 11.3 10*3/uL — AB (ref 4.0–10.5)

## 2015-09-22 LAB — COMPREHENSIVE METABOLIC PANEL
ALBUMIN: 4.4 g/dL (ref 3.6–5.1)
ALT: 25 U/L (ref 9–46)
AST: 18 U/L (ref 10–35)
Alkaline Phosphatase: 69 U/L (ref 40–115)
BILIRUBIN TOTAL: 0.9 mg/dL (ref 0.2–1.2)
BUN: 27 mg/dL — AB (ref 7–25)
CHLORIDE: 97 mmol/L — AB (ref 98–110)
CO2: 29 mmol/L (ref 20–31)
CREATININE: 1.06 mg/dL (ref 0.70–1.33)
Calcium: 9.7 mg/dL (ref 8.6–10.3)
Glucose, Bld: 245 mg/dL — ABNORMAL HIGH (ref 65–99)
Potassium: 5.6 mmol/L — ABNORMAL HIGH (ref 3.5–5.3)
SODIUM: 134 mmol/L — AB (ref 135–146)
TOTAL PROTEIN: 6.7 g/dL (ref 6.1–8.1)

## 2015-09-22 LAB — TSH: TSH: 1.99 mIU/L (ref 0.40–4.50)

## 2015-10-12 ENCOUNTER — Encounter: Payer: Self-pay | Admitting: *Deleted

## 2015-10-12 ENCOUNTER — Ambulatory Visit (INDEPENDENT_AMBULATORY_CARE_PROVIDER_SITE_OTHER): Payer: Self-pay | Admitting: Cardiovascular Disease

## 2015-10-12 VITALS — BP 102/70 | HR 91 | Ht 72.0 in | Wt 255.3 lb

## 2015-10-12 DIAGNOSIS — E875 Hyperkalemia: Secondary | ICD-10-CM

## 2015-10-12 DIAGNOSIS — E1165 Type 2 diabetes mellitus with hyperglycemia: Secondary | ICD-10-CM

## 2015-10-12 DIAGNOSIS — I255 Ischemic cardiomyopathy: Secondary | ICD-10-CM

## 2015-10-12 DIAGNOSIS — I420 Dilated cardiomyopathy: Secondary | ICD-10-CM

## 2015-10-12 DIAGNOSIS — I10 Essential (primary) hypertension: Secondary | ICD-10-CM

## 2015-10-12 DIAGNOSIS — E669 Obesity, unspecified: Secondary | ICD-10-CM

## 2015-10-12 MED ORDER — IVABRADINE HCL 5 MG PO TABS: 5.0000 mg | ORAL_TABLET | Freq: Two times a day (BID) | ORAL | Status: DC

## 2015-10-12 NOTE — Patient Instructions (Addendum)
PLEASE HAVE LABS- BMP TODAY  START [name] 5 MG TWICE A DAY   IN 2 WEEKS - Your physician recommends that you schedule a follow-up appointment  WITH  KRISTIN-- MEDICATION CHANGE   Your physician recommends that you schedule a follow-up appointment in 2 [place].

## 2015-10-13 ENCOUNTER — Telehealth: Payer: Self-pay | Admitting: Cardiovascular Disease

## 2015-10-13 ENCOUNTER — Encounter: Payer: Self-pay | Admitting: Cardiovascular Disease

## 2015-10-13 DIAGNOSIS — E875 Hyperkalemia: Secondary | ICD-10-CM | POA: Insufficient documentation

## 2015-10-13 LAB — BASIC METABOLIC PANEL
BUN: 27 mg/dL — ABNORMAL HIGH (ref 7–25)
CO2: 23 mmol/L (ref 20–31)
Calcium: 10.1 mg/dL (ref 8.6–10.3)
Chloride: 97 mmol/L — ABNORMAL LOW (ref 98–110)
Creat: 1.06 mg/dL (ref 0.70–1.33)
Glucose, Bld: 408 mg/dL — ABNORMAL HIGH (ref 65–99)
POTASSIUM: 4.9 mmol/L (ref 3.5–5.3)
SODIUM: 133 mmol/L — AB (ref 135–146)

## 2015-10-13 NOTE — Telephone Encounter (Signed)
Note in results; K better; make certain he is taking his dm Meds; contact primary for f/u for improved DM control ASAP.

## 2015-10-13 NOTE — Telephone Encounter (Signed)
Left msg for patient to call. 

## 2015-10-13 NOTE — Telephone Encounter (Signed)
Received signed [place] [employer] back from Dr Claiborne Billings on 10/12/15. Patient was given signed forms while at his office visit on 3/20/.17. lp

## 2015-10-13 NOTE — Progress Notes (Signed)
Patient ID: Brian Herring, male   DOB: [birth year], 60 y.o.   MRN: [id number]   Primary MD: Dr. Melinda Crutch  HPI: Brian Herring is a 60 y.o. male who presents to the office for a 2 month cardiology evaluation.  Brian Herring is a former patient of Dr. Janene Madeira. 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 With medical therapy EF has improved to 45-50% range noted on echo Doppler study in April 2012. An echo Doppler study of 03/26/2013 showed EF 45-50% with diffuse hypokinesis. He had normal pulmonary pressures.    He has a history of hyperlipidemia with significant triglyceride elevation.  Laboratory revealed significant triglyceride elevation at 354 with a low HDL at 30 and markedly increased VLDL at 71 of those total cholesterol was 127 and LDL 26 consistent with an atherogenic dyslipidemia pattern. His TSH was normal at 1.978. Hemoglobin A1c was markedly elevated at 14.1 with a mean plasma glucose of 358.   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 He has remained asymptomatic.  He denies chest pain, or recent palpitations.  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 I recommended that he undergo a follow-up echo Doppler study reevaluate his LV function.  This was done on 06/12/2015 and showed reduced ejection fraction at 25-30% with diffuse hypokinesis. There was mild LVH, mild left atrial enlargement and trace MR.     I scheduled him to undergo a nuclear perfusion study to assess his LV function and myocardial perfusion on 07/03/2015 which confirmed his severe LV dysfunction with a nuclear stress ejection fraction at 24%. There was a small defect of mild severity in the inferior wall and also apex.  He was felt to have diaphragmatic attenuation.  The LV was dilated any his LV dysfunction was presumed to be of nonischemic etiology. He denies chest pain.  He admits to getting shortness of  breath with significant activity but not with routine work.  He still able to pick up heavy glass and perform well in lifting this.  However, over the past year, he admits that he is not exercising as he had in the past.   He has not been working since he has not had his DOT renewed based on Cardiologic evaluation.  When I saw him  In December 2016, I changed his Toprol to carvedilol initially at 6.25 mg twice a day and titrated his spironolactone to 12.5 mg twice a day.   On his subsequent visit carvedilol was further increased to 12.5 mg twice a day.  He had follow-up laboratory.  His potassium was 4.9.  Glucose was increased at 237.  TSH was normal.  Hemoglobin A1c was increased at 8.8.  Lipid studies revealed elevation of triglycerides at 226 with a low HDL of 32, and increased VLDL at 45 suggestive of an atherogenic dyslipidemic pattern.  Total cholesterol was 169 with LDL of 92.    When I last saw him,  I further titrated his carvedilol ultimately to 25 mg twice a day and increased his spironolactone to twice a day dosing.  He is no longer able to work and is filing for disability. He tells me that he was supposed to have a [employer] initiated, but he had been contacted by his health insurance that this had been discontinued.  Recent blood work revealed elevated potassium at  5.6 and today he admits that he has been having significant amount of tomatoes  And other foods with high potassium content. He denies shortness of breath.  He denies palpitations. His breathing has improved with his medication titration.   A follow-up echo Doppler study was done on 09/02/2015 and this still showed reduced LV function at 25-30%.  He presents for reevaluation.  Past Medical History  Diagnosis Date  . Hypertension   . Hyperlipidemia   . Diabetes mellitus without complication [facility])     Past Surgical History  Procedure Laterality Date  . Shoulder surgery  01/05    Allergies  Allergen Reactions    . Sulfur Rash    Current Outpatient Prescriptions  Medication Sig Dispense Refill  . acarbose (PRECOSE) 25 MG tablet Take 1 tablet by mouth 3 (three) times daily with meals.     Marland Kitchen amLODipine (NORVASC) 5 MG tablet Take 1 tablet (5 mg total) by mouth daily. 90 tablet 3  . aspirin 81 MG tablet Take 81 mg by mouth daily.    Marland Kitchen atorvastatin (LIPITOR) 80 MG tablet Take 1 tablet (80 mg total) by mouth daily. 90 tablet 3  . carvedilol (COREG) 25 MG tablet Take 1 tablet (25 mg total) by mouth 2 (two) times daily. 180 tablet 3  . glimepiride (AMARYL) 4 MG tablet Take 1 tablet by mouth daily.  1  . metFORMIN (GLUCOPHAGE) 500 MG tablet Take 2 tablets by mouth 2 (two) times daily.    . Multiple Vitamins-Minerals (MULTIVITAL PO) Take by mouth.    . ramipril (ALTACE) 10 MG capsule Take 1 capsule (10 mg total) by mouth daily. 90 capsule 3  . spironolactone (ALDACTONE) 25 MG tablet Take 1 tablet (25 mg total) by mouth 2 (two) times daily. 180 tablet 3  . ivabradine ([name]) 5 MG TABS tablet Take 1 tablet (5 mg total) by mouth 2 (two) times daily with a meal. 56 tablet 0   No current facility-administered medications for this visit.    Social History   Social History  . Marital Status: Single    Spouse Name: N/A  . Number of Children: N/A  . Years of Education: N/A   Occupational History  . Not on file.   Social History Main Topics  . Smoking status: Never Smoker   . Smokeless tobacco: Never Used  . Alcohol Use: No  . Drug Use: Not on file  . Sexual Activity: Not on file   Other Topics Concern  . Not on file   Social History Narrative    Family History  Problem Relation Age of Onset  . Pneumonia Mother     aspiration   Socially he is single with no children. No tobacco or alcohol use. He does not routinely exercise.  ROS General: Negative; No fevers, chills, or night sweats;  HEENT: Negative; No changes in vision or hearing, sinus congestion, difficulty swallowing Pulmonary:  Negative; No cough, wheezing, shortness of breath, hemoptysis Cardiovascular: See history of present illness. GI: Negative; No nausea, vomiting, diarrhea, or abdominal pain GU: Negative; No dysuria, hematuria, or difficulty voiding Musculoskeletal: Negative; no myalgias, joint pain, or weakness Hematologic/Oncology: Negative; no easy bruising, bleeding Endocrine: Negative; no heat/cold intolerance; no diabetes Neuro: Negative; no changes in balance, headaches Skin: Negative; No rashes or skin lesions Psychiatric: Negative; No behavioral problems, depression Sleep: Negative; No snoring, daytime sleepiness, hypersomnolence, bruxism, restless legs, hypnogognic hallucinations, no cataplexy Other comprehensive 14 point system review is negative.   PE BP 102/70 mmHg  Pulse 91  Ht 6' (1.829 m)  Wt 255 lb 4.8 oz (115.803 kg)  BMI 34.62 kg/m2   Wt Readings from Last 3 Encounters:  10/12/15 255 lb 4.8 oz (115.803 kg)  08/20/15 253 lb (114.76 kg)  07/13/15 256 lb 8 oz (116.348 kg)   General: Alert, oriented, no distress.  Skin: normal turgor, no rashes HEENT: Normocephalic, atraumatic. Pupils round and reactive; sclera anicteric;no lid lag.  Nose without nasal septal hypertrophy Mouth/Parynx benign; Mallinpatti scale 3 Neck: No JVD, no carotid bruits with normal carotid upstroke Lungs: clear to ausculatation and percussion; no wheezing or rales Chest wall: Nontender to palpation Heart: RRR, s1 s2 normal faint 2-9/5 systolic murmur. No diastolic murmurs heard no rubs thrills or heaves Abdomen: Moderate central adiposity, nontender; no hepatosplenomehaly, BS+; abdominal aorta nontender and not dilated by palpation. Back: No CVA tenderness Pulses 2+ Extremities: no clubbing cyanosis or edema, Homan's sign negative  Neurologic: grossly nonfocal Psychological: Normal affect and mood  ECG (independently read by me):  Normal sinus rhythm at 91 bpm with incomplete left bundle branch  block.  January 2017 ECG (independently read by me): Normal sinus rhythm at 87 bpm.  QTc interval 457 ms.  An isolated PVC.  06/25/15 ECG (independently read by me):  Normal sinus rhythm at 94 bpm.  November 2016 ECG (independently read by me): Sinus rhythm at 97 bpm with an occasional PAC.  Incomplete left bundle-branch block.  February 2015 ECG (independently read by (: Normal sinus rhythm at 95 beats per minute; PR interval 160 ms, QTc interval 457 bili sec  Prior ECG of 04/02/2013: Sinus tachycardia 102 beats per minute. QTc interval 461 ms.  LABS: BMP Latest Ref Rng 10/12/2015 09/21/2015 08/14/2015  Glucose 65 - 99 mg/dL 408(H) 245(H) 237(H)  BUN 7 - 25 mg/dL 27(H) 27(H) 20  Creatinine 0.70 - 1.33 mg/dL 1.06 1.06 0.77  Sodium 135 - 146 mmol/L 133(L) 134(L) 136  Potassium 3.5 - 5.3 mmol/L 4.9 5.6(H) 4.9  Chloride 98 - 110 mmol/L 97(L) 97(L) 100  CO2 20 - 31 mmol/L '23 29 28  ' Calcium 8.6 - 10.3 mg/dL 10.1 9.7 9.5   Hepatic Function Latest Ref Rng 09/21/2015 08/14/2015 07/23/2013  Total Protein 6.1 - 8.1 g/dL 6.7 7.0 6.7  Albumin 3.6 - 5.1 g/dL 4.4 4.5 4.4  AST 10 - 35 U/L 18 18 48(H)  ALT 9 - 46 U/L 25 28 53  Alk Phosphatase 40 - 115 U/L 69 77 110  Total Bilirubin 0.2 - 1.2 mg/dL 0.9 0.7 0.5   CBC Latest Ref Rng 09/21/2015 08/14/2015 07/23/2013  WBC 4.0 - 10.5 K/uL 11.3(H) 8.7 10.2  Hemoglobin 13.0 - 17.0 g/dL 14.9 15.7 15.8  Hematocrit 39.0 - 52.0 % 44.6 45.3 45.8  Platelets 150 - 400 K/uL 258 224 266   Lab Results  Component Value Date   MCV 89.0 09/21/2015   MCV 90.2 08/14/2015   MCV 86.4 07/23/2013   Lab Results  Component Value Date   TSH 1.99 09/21/2015   Lipid Panel     Component Value Date/Time   CHOL 118* 09/21/2015 1339   TRIG 203* 09/21/2015 1339   HDL 30* 09/21/2015 1339   CHOLHDL 3.9 09/21/2015 1339   VLDL 41* 09/21/2015 1339   LDLCALC 47 09/21/2015 1339    RADIOLOGY: No results found.    ASSESSMENT AND PLAN: Mr. Tayden Nichelson is a 60 years old  with a previous history of a presumed viral cardiomyopathy.  Cardiac catheterization at that time apparently  did not reveal significant obstructive disease.  His LV function had improved from 25% to 45-50% when checked in 2014 and was unchanged from 2012.   He recently was found to have recurrence of significant LV dysfunction with an ejection fraction at 25-30%.   He has felt improved with medication titration and has been on amlodipine 5 mg, carvedilol 25 mg twice a day, ramipril 10 mg, in addition to spironolactone 25 mg twice a day.  He had developed recent increase potassium , but admitted to significant amount of potassium-containing foods.  A follow-up potassium level today was improved at 4.6. His blood sugar is not controlled despite taking his metformin, and glimepiride and he will need more aggressive diabetic management and dietary control.  With his EKG today showing a resting pulse at 91, I am adding [name]  to his medical regimen and will initiate this at 5 mg twice a day. In the past, we had discussed the possibility of repeat cardiac catheterization as well as probable need for ICD implantation. I recommended that he follow-up with his previous employer to see if he can obtain the [employer] that had been promised to him.  He would like to defer potential catheterization or defibrillator implantation until the insurance issue is worked out. In 2 weeks.  2.  Will return to the office to see Cyril Mourning to see if in fact dose titration of his core is needed. If his renal function remained stable and potassium continues to be normal.  He may also be a candidate for entresto in place of his ramipril.  I will see him in 2 months for cardiology reevaluation.  Time spent: 25 minutes  Troy Sine, MD, [facility]  10/13/2015 6:17 PM

## 2015-10-13 NOTE — Telephone Encounter (Signed)
Returned call. Call was in ref to alert high Glucose (408). Findings repeated and verified. The lab results are visible in EPIC. Routed to Dr. Claiborne Billings to review.

## 2015-10-13 NOTE — Telephone Encounter (Signed)
Brian Herring is calling w/ Alert Values .

## 2015-10-27 ENCOUNTER — Ambulatory Visit: Payer: 59 | Admitting: Cardiovascular Disease

## 2015-10-29 ENCOUNTER — Ambulatory Visit (INDEPENDENT_AMBULATORY_CARE_PROVIDER_SITE_OTHER): Payer: Self-pay | Admitting: Pharmacist Clinician (PhC)/ Clinical Pharmacy Specialist

## 2015-10-29 ENCOUNTER — Encounter: Payer: Self-pay | Admitting: Pharmacist Clinician (PhC)/ Clinical Pharmacy Specialist

## 2015-10-29 VITALS — BP 126/68 | HR 76 | Ht 72.0 in | Wt 257.2 lb

## 2015-10-29 DIAGNOSIS — I420 Dilated cardiomyopathy: Secondary | ICD-10-CM

## 2015-10-29 MED ORDER — CARVEDILOL 25 MG PO TABS
ORAL_TABLET | ORAL | Status: DC
Start: 2015-10-29 — End: 2015-12-15

## 2015-10-29 NOTE — Patient Instructions (Signed)
Return for a a follow up appointment in 6 weeks  Your blood pressure today is 126/68, heart rate 76  Check your heart rate and BP at home 3-4 times per week and keep record of the readings.  Medication changes:  Take 1/2 carvedilol (12.5 mg) each morning and 1 tablet (25 mg) each evening.  Try taking [name] twice daily.  Increase your exercise to daily as tolerated.  Bring all of your meds, your BP cuff and your record of home blood pressures to your next appointment.  Exercise as you're able, try to walk approximately 30 minutes per day.  Keep salt intake to a minimum, especially watch canned and prepared boxed foods.  Eat more fresh fruits and vegetables and fewer canned items.  Avoid eating in fast food restaurants.    HOW TO TAKE YOUR BLOOD PRESSURE: . Rest 5 minutes before taking your blood pressure. .  Don't smoke or drink caffeinated beverages for at least 30 minutes before. . Take your blood pressure before (not after) you eat. . Sit comfortably with your back supported and both feet on the floor (don't cross your legs). . Elevate your arm to heart level on a table or a desk. . Use the proper sized cuff. It should fit smoothly and snugly around your bare upper arm. There should be enough room to slip a fingertip under the cuff. The bottom edge of the cuff should be 1 inch above the crease of the elbow. . Ideally, take 3 measurements at one sitting and record the average.

## 2015-10-29 NOTE — Progress Notes (Signed)
10/29/2015 COBIN HIRTZ 12-22-1955 [id number]   HPI:  Brian Herring is a 60 y.o. male patient of Dr Claiborne Billings, with a PMH below who presents today for heart failure medication titration.  He has a history of viral cardiomyopathy and an EF that is now around 25-30%.  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   He has been asymptomatic with his HF to date and is frustrated by feeling so well, but not able to pass his DOT.  Today his only complaints are some chest pressure and low energy in the mornings, especially since the carvedilol was increased to 25 mg bid.  At his last visit he was started on [name] 5 mg bid.  However he found that about 30-45 minutes after the morning dose he would get extremely lightheaded and "off".  After 3-4 days of this, he stopped taking the morning dose, but continues to take at bedtime  Cardiac Hx: viral cardiomyopathy, EF 25-35%, hypertension, hyperlipidemia  Social Hx: no alcohol or tobacco; drinks occasional caffeine  Exercise: has been walking 2 miles/day 2-4 times per week, trying to increase to daily  Diet:  Eats mostly home cooked meals, does not add salt  Patient reports home BP readings WNL and heart rate 70-80's  Currently taking: carvedilol 25 mg bid, [name] 5 mg qd (not bid), amlodipine 5 mg qd, atorvastatin 80 mg qd, ramipril 10 mg qd, spironolactone 25 mg bid   Wt Readings from Last 3 Encounters:  10/29/15 257 lb 3.2 oz (116.665 kg)  10/12/15 255 lb 4.8 oz (115.803 kg)  08/20/15 253 lb (114.76 kg)   BP Readings from Last 3 Encounters:  10/29/15 126/68  10/12/15 102/70  08/20/15 100/70   Pulse Readings from Last 3 Encounters:  10/29/15 76  10/12/15 91  08/20/15 88    Current Outpatient Prescriptions  Medication Sig Dispense Refill  . acarbose (PRECOSE) 25 MG tablet Take 1 tablet by mouth 3 (three) times daily with meals.     Marland Kitchen amLODipine  (NORVASC) 5 MG tablet Take 1 tablet (5 mg total) by mouth daily. 90 tablet 3  . aspirin 81 MG tablet Take 81 mg by mouth daily.    Marland Kitchen atorvastatin (LIPITOR) 80 MG tablet Take 1 tablet (80 mg total) by mouth daily. 90 tablet 3  . carvedilol (COREG) 25 MG tablet Take 1 tablet (25 mg total) by mouth 2 (two) times daily. 180 tablet 3  . glimepiride (AMARYL) 4 MG tablet Take 1 tablet by mouth daily.  1  . ivabradine ([name]) 5 MG TABS tablet Take 1 tablet (5 mg total) by mouth 2 (two) times daily with a meal. 56 tablet 0  . metFORMIN (GLUCOPHAGE) 500 MG tablet Take 2 tablets by mouth 2 (two) times daily.    . Multiple Vitamins-Minerals (MULTIVITAL PO) Take by mouth.    . ramipril (ALTACE) 10 MG capsule Take 1 capsule (10 mg total) by mouth daily. 90 capsule 3  . spironolactone (ALDACTONE) 25 MG tablet Take 1 tablet (25 mg total) by mouth 2 (two) times daily. 180 tablet 3   No current facility-administered medications for this visit.    Allergies  Allergen Reactions  . Sulfur Rash    Past Medical History  Diagnosis Date  . Hypertension   . Hyperlipidemia   . Diabetes mellitus without complication (HCC)     Blood pressure 126/68, pulse  76, height 6' (1.829 m), weight 257 lb 3.2 oz (116.665 kg).    Tommy Medal PharmD CPP [facility]

## 2015-10-29 NOTE — Assessment & Plan Note (Signed)
Patient returns today for follow up to see if we can up titrate his ivabradine from 5 mg bid to 7.5 mg bid.  Unfortunately he has only been taking this at night, as he states the first few doses made him feel quite lightheaded about 45 minutes after taking.  I have asked that he again try to take twice daily for maximum benefit, and will decrease his morning carvedilol to 12.5 mg to help with the low energy complaints. He will continue with the 25 mg each evening.  He is currently un-insured, but is applying for insurance and disability.  I will leave him at 5 mg of ivabradine and have encouraged him to take twice daily.  I will see him back in May, once he is insured, to determine if we can increase to 7.5 mg bid.  Until then, he has been given samples to last.

## 2015-12-15 ENCOUNTER — Ambulatory Visit (INDEPENDENT_AMBULATORY_CARE_PROVIDER_SITE_OTHER): Payer: BLUE CROSS/BLUE SHIELD | Admitting: Pharmacist Clinician (PhC)/ Clinical Pharmacy Specialist

## 2015-12-15 ENCOUNTER — Encounter: Payer: Self-pay | Admitting: Pharmacist Clinician (PhC)/ Clinical Pharmacy Specialist

## 2015-12-15 VITALS — BP 122/68 | HR 80 | Ht 72.0 in | Wt 260.6 lb

## 2015-12-15 DIAGNOSIS — I420 Dilated cardiomyopathy: Secondary | ICD-10-CM | POA: Diagnosis not present

## 2015-12-15 MED ORDER — METOPROLOL SUCCINATE ER 50 MG PO TB24: 50.0000 mg | ORAL_TABLET | Freq: Every day | ORAL | Status: DC

## 2015-12-15 MED ORDER — IVABRADINE HCL 7.5 MG PO TABS: 7.5000 mg | ORAL_TABLET | Freq: Two times a day (BID) | ORAL | Status: DC

## 2015-12-15 NOTE — Progress Notes (Signed)
12/15/2015 Brian Herring [birth year] [id number]   HPI:  Brian Herring is a 60 y.o. male patient of Dr Claiborne Billings, with a PMH below who presents today for heart failure medication titration.  He has a history of viral cardiomyopathy and an EF that is now around 25-30%.  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   He has been asymptomatic with his HF to date and is frustrated by feeling so well but being low on energy for activities such as mowing the lawn.  Since his last visit he has started taking his [name] twice daily, however he quit the morning doses of carvedilol.  He feels that it "knocks him down" and leaves him feeling worn out much of the day.  Without it he feels much better.   Cardiac Hx: viral cardiomyopathy, EF 25-35%, hypertension, hyperlipidemia  Social Hx: no alcohol or tobacco; drinks occasional caffeine  Exercise: has been walking 2 miles/day 2-4 times per week, trying to increase to daily  Diet:  Eats mostly home cooked meals, does not add salt  Patient reports home BP readings WNL and heart rate 68-80's.  Highest BP was 123456 systolic.  Currently taking: carvedilol 25 mg qd (not bid), [name] 5 mg bid, amlodipine 5 mg qd, atorvastatin 80 mg qd, ramipril 10 mg qd, spironolactone 25 mg bid   Wt Readings from Last 3 Encounters:  12/15/15 260 lb 9.6 oz (118.207 kg)  10/29/15 257 lb 3.2 oz (116.665 kg)  10/12/15 255 lb 4.8 oz (115.803 kg)   BP Readings from Last 3 Encounters:  12/15/15 122/68  10/29/15 126/68  10/12/15 102/70   Pulse Readings from Last 3 Encounters:  12/15/15 80  10/29/15 76  10/12/15 91    Current Outpatient Prescriptions  Medication Sig Dispense Refill  . acarbose (PRECOSE) 25 MG tablet Take 1 tablet by mouth 3 (three) times daily with meals.     Marland Kitchen amLODipine (NORVASC) 5 MG tablet Take 1 tablet (5 mg total) by mouth daily. 90 tablet 3  . aspirin 81 MG tablet Take 81 mg by mouth daily.    Marland Kitchen  atorvastatin (LIPITOR) 80 MG tablet Take 1 tablet (80 mg total) by mouth daily. 90 tablet 3  . glimepiride (AMARYL) 4 MG tablet Take 1 tablet by mouth daily.  1  . ivabradine ([name]) 7.5 MG TABS tablet Take 1 tablet (7.5 mg total) by mouth 2 (two) times daily with a meal. 60 tablet 3  . KOMBIGLYZE XR 2.11-998 MG TB24 Take 1 tablet by mouth 2 (two) times daily.  0  . metoprolol succinate (TOPROL-XL) 50 MG 24 hr tablet Take 1 tablet (50 mg total) by mouth daily. Take with or immediately following a meal. 30 tablet 3  . Multiple Vitamins-Minerals (MULTIVITAL PO) Take by mouth.    . ramipril (ALTACE) 10 MG capsule Take 1 capsule (10 mg total) by mouth daily. 90 capsule 3  . spironolactone (ALDACTONE) 25 MG tablet Take 1 tablet (25 mg total) by mouth 2 (two) times daily. 180 tablet 3   No current facility-administered medications for this visit.    Allergies  Allergen Reactions  . Sulfur Rash    Past Medical History  Diagnosis Date  . Hypertension   . Hyperlipidemia   . Diabetes mellitus without complication (HCC)     Blood pressure 122/68, pulse 80, height 6' (1.829 m), weight 260 lb 9.6 oz (118.207 kg).    Tommy Medal PharmD  [facility]

## 2015-12-15 NOTE — Patient Instructions (Signed)
Stop taking carvedilol and start metoprolol succinate 50 mg once daily with dinner.    When you run out of [name] samples (5 mg) start with the 7.5 mg dose.  Continue to take twice daily.  Check heart rate/blood pressure several times each week.  Watch for heart rates < 60, if happens frequently please call to let us know.

## 2015-12-15 NOTE — Assessment & Plan Note (Signed)
Patient now taking [name] twice daily, but has decreased carvedilol to once daily (25 mg at hs).  Will switch his beta blocker to metoprolol succinate 50 mg once daily in the evenings to see if we can eliminate side effects caused by carvedilol.  He has 2 weeks worth of [name] 5 mg samples still at home, so I will have him finish those off and then increase dose to 7.5 mg twice daily.  He is to continue with regular home BP/heart rate checks and call the office if he notices HR <60 on multiple occasions.

## 2016-03-07 ENCOUNTER — Telehealth: Payer: Self-pay | Admitting: Cardiovascular Disease

## 2016-03-07 NOTE — Telephone Encounter (Signed)
He left some forms here to have filled out. He said he received a call about paying to get them completed.

## 2016-03-09 NOTE — Telephone Encounter (Signed)
Returned call to patient.He wanted to know if [employer] form has been done.Spoke to medical records they will send form to [employer] 03/10/16.

## 2016-03-09 NOTE — Telephone Encounter (Signed)
Follow up   Pt is calling to verify if there is a payment that needs to be done for the doctor to fill out the form and send it in to his insurance  Pt said the paper work is a way that he can get  500.00 dollars and he has not been working since January

## 2016-03-10 NOTE — Telephone Encounter (Signed)
Patient called left message on personal voice mail [employer] form completed and faxed by medical records.

## 2017-05-05 ENCOUNTER — Telehealth (HOSPITAL_COMMUNITY): Payer: Self-pay

## 2017-05-05 NOTE — Telephone Encounter (Signed)
Called patient to discuss Cardiac Rehab. Left voicemail to call back.

## 2017-05-05 NOTE — Telephone Encounter (Signed)
Verified Insurance. No co-payment, deductible amount is $7,000/$73.30 has been met, out of pocket amount is $7,350/$484.28 has been met, 30% co-insurance, and no pre-authorization is required. Passport/reference # [id number]

## 2017-05-12 NOTE — Telephone Encounter (Signed)
Attempted to call patient in regards to Cardiac Rehab - LM on VM 

## 2017-05-19 ENCOUNTER — Encounter (HOSPITAL_COMMUNITY): Payer: Self-pay

## 2017-05-19 NOTE — Telephone Encounter (Signed)
3rd Attempt to call patient in regards to Cardiac Rehab. Sending letter to patient.

## 2017-06-02 NOTE — Telephone Encounter (Signed)
No response from patient in regards to Cardiac Rehab - Closed referral. °

## 2017-10-12 ENCOUNTER — Inpatient Hospital Stay (HOSPITAL_BASED_OUTPATIENT_CLINIC_OR_DEPARTMENT_OTHER)
Admission: EM | Admit: 2017-10-12 | Discharge: 2017-10-20 | DRG: 854 | Disposition: A | Discharge status: Rehabilitation Facility | Payer: BLUE CROSS/BLUE SHIELD | Attending: Internal Medicine | Admitting: Internal Medicine

## 2017-10-12 ENCOUNTER — Encounter (HOSPITAL_BASED_OUTPATIENT_CLINIC_OR_DEPARTMENT_OTHER): Payer: Self-pay | Admitting: *Deleted

## 2017-10-12 ENCOUNTER — Other Ambulatory Visit: Payer: Self-pay

## 2017-10-12 ENCOUNTER — Emergency Department (HOSPITAL_BASED_OUTPATIENT_CLINIC_OR_DEPARTMENT_OTHER): Payer: BLUE CROSS/BLUE SHIELD

## 2017-10-12 DIAGNOSIS — M868X7 Other osteomyelitis, ankle and foot: Secondary | ICD-10-CM | POA: Diagnosis present

## 2017-10-12 DIAGNOSIS — E1152 Type 2 diabetes mellitus with diabetic peripheral angiopathy with gangrene: Secondary | ICD-10-CM | POA: Diagnosis present

## 2017-10-12 DIAGNOSIS — I447 Left bundle-branch block, unspecified: Secondary | ICD-10-CM | POA: Diagnosis present

## 2017-10-12 DIAGNOSIS — D62 Acute posthemorrhagic anemia: Secondary | ICD-10-CM | POA: Diagnosis not present

## 2017-10-12 DIAGNOSIS — A419 Sepsis, unspecified organism: Principal | ICD-10-CM | POA: Diagnosis present

## 2017-10-12 DIAGNOSIS — T3695XA Adverse effect of unspecified systemic antibiotic, initial encounter: Secondary | ICD-10-CM | POA: Diagnosis not present

## 2017-10-12 DIAGNOSIS — I2510 Atherosclerotic heart disease of native coronary artery without angina pectoris: Secondary | ICD-10-CM | POA: Diagnosis present

## 2017-10-12 DIAGNOSIS — K921 Melena: Secondary | ICD-10-CM | POA: Diagnosis not present

## 2017-10-12 DIAGNOSIS — E11621 Type 2 diabetes mellitus with foot ulcer: Secondary | ICD-10-CM | POA: Diagnosis present

## 2017-10-12 DIAGNOSIS — D72829 Elevated white blood cell count, unspecified: Secondary | ICD-10-CM | POA: Diagnosis not present

## 2017-10-12 DIAGNOSIS — M869 Osteomyelitis, unspecified: Secondary | ICD-10-CM | POA: Diagnosis not present

## 2017-10-12 DIAGNOSIS — E871 Hypo-osmolality and hyponatremia: Secondary | ICD-10-CM | POA: Diagnosis present

## 2017-10-12 DIAGNOSIS — I5022 Chronic systolic (congestive) heart failure: Secondary | ICD-10-CM | POA: Diagnosis present

## 2017-10-12 DIAGNOSIS — Z7982 Long term (current) use of aspirin: Secondary | ICD-10-CM

## 2017-10-12 DIAGNOSIS — E785 Hyperlipidemia, unspecified: Secondary | ICD-10-CM | POA: Diagnosis present

## 2017-10-12 DIAGNOSIS — E1140 Type 2 diabetes mellitus with diabetic neuropathy, unspecified: Secondary | ICD-10-CM | POA: Diagnosis present

## 2017-10-12 DIAGNOSIS — T500X5A Adverse effect of mineralocorticoids and their antagonists, initial encounter: Secondary | ICD-10-CM | POA: Diagnosis present

## 2017-10-12 DIAGNOSIS — T501X5A Adverse effect of loop [high-ceiling] diuretics, initial encounter: Secondary | ICD-10-CM | POA: Diagnosis present

## 2017-10-12 DIAGNOSIS — E46 Unspecified protein-calorie malnutrition: Secondary | ICD-10-CM | POA: Diagnosis not present

## 2017-10-12 DIAGNOSIS — L97519 Non-pressure chronic ulcer of other part of right foot with unspecified severity: Secondary | ICD-10-CM | POA: Diagnosis present

## 2017-10-12 DIAGNOSIS — L97509 Non-pressure chronic ulcer of other part of unspecified foot with unspecified severity: Secondary | ICD-10-CM

## 2017-10-12 DIAGNOSIS — Z89511 Acquired absence of right leg below knee: Secondary | ICD-10-CM | POA: Diagnosis not present

## 2017-10-12 DIAGNOSIS — Z4781 Encounter for orthopedic aftercare following surgical amputation: Secondary | ICD-10-CM | POA: Diagnosis not present

## 2017-10-12 DIAGNOSIS — E1165 Type 2 diabetes mellitus with hyperglycemia: Secondary | ICD-10-CM | POA: Diagnosis present

## 2017-10-12 DIAGNOSIS — Z91048 Other nonmedicinal substance allergy status: Secondary | ICD-10-CM

## 2017-10-12 DIAGNOSIS — S88119S Complete traumatic amputation at level between knee and ankle, unspecified lower leg, sequela: Secondary | ICD-10-CM | POA: Diagnosis not present

## 2017-10-12 DIAGNOSIS — M65171 Other infective (teno)synovitis, right ankle and foot: Secondary | ICD-10-CM | POA: Diagnosis present

## 2017-10-12 DIAGNOSIS — E875 Hyperkalemia: Secondary | ICD-10-CM | POA: Diagnosis not present

## 2017-10-12 DIAGNOSIS — E1169 Type 2 diabetes mellitus with other specified complication: Secondary | ICD-10-CM | POA: Diagnosis present

## 2017-10-12 DIAGNOSIS — I10 Essential (primary) hypertension: Secondary | ICD-10-CM | POA: Diagnosis not present

## 2017-10-12 DIAGNOSIS — L089 Local infection of the skin and subcutaneous tissue, unspecified: Secondary | ICD-10-CM | POA: Diagnosis present

## 2017-10-12 DIAGNOSIS — E1142 Type 2 diabetes mellitus with diabetic polyneuropathy: Secondary | ICD-10-CM | POA: Diagnosis not present

## 2017-10-12 DIAGNOSIS — L02611 Cutaneous abscess of right foot: Secondary | ICD-10-CM | POA: Diagnosis present

## 2017-10-12 DIAGNOSIS — T148XXA Other injury of unspecified body region, initial encounter: Secondary | ICD-10-CM | POA: Diagnosis not present

## 2017-10-12 DIAGNOSIS — I110 Hypertensive heart disease with heart failure: Secondary | ICD-10-CM | POA: Diagnosis present

## 2017-10-12 DIAGNOSIS — Z79899 Other long term (current) drug therapy: Secondary | ICD-10-CM

## 2017-10-12 DIAGNOSIS — L03115 Cellulitis of right lower limb: Secondary | ICD-10-CM | POA: Diagnosis present

## 2017-10-12 DIAGNOSIS — K521 Toxic gastroenteritis and colitis: Secondary | ICD-10-CM | POA: Diagnosis not present

## 2017-10-12 DIAGNOSIS — Z7984 Long term (current) use of oral hypoglycemic drugs: Secondary | ICD-10-CM

## 2017-10-12 DIAGNOSIS — R339 Retention of urine, unspecified: Secondary | ICD-10-CM | POA: Diagnosis not present

## 2017-10-12 DIAGNOSIS — E11628 Type 2 diabetes mellitus with other skin complications: Secondary | ICD-10-CM | POA: Diagnosis not present

## 2017-10-12 DIAGNOSIS — L97513 Non-pressure chronic ulcer of other part of right foot with necrosis of muscle: Secondary | ICD-10-CM | POA: Diagnosis not present

## 2017-10-12 DIAGNOSIS — I420 Dilated cardiomyopathy: Secondary | ICD-10-CM | POA: Diagnosis present

## 2017-10-12 DIAGNOSIS — S88111A Complete traumatic amputation at level between knee and ankle, right lower leg, initial encounter: Secondary | ICD-10-CM | POA: Diagnosis not present

## 2017-10-12 DIAGNOSIS — M86271 Subacute osteomyelitis, right ankle and foot: Secondary | ICD-10-CM

## 2017-10-12 HISTORY — DX: Chronic systolic (congestive) heart failure: I50.22

## 2017-10-12 LAB — URINALYSIS, ROUTINE W REFLEX MICROSCOPIC
Bilirubin Urine: NEGATIVE
HGB URINE DIPSTICK: NEGATIVE
KETONES UR: 15 mg/dL — AB
Leukocytes, UA: NEGATIVE
Nitrite: NEGATIVE
PROTEIN: NEGATIVE mg/dL
Specific Gravity, Urine: <1.005 — ABNORMAL LOW (ref 1.005–1.030)
pH: 5.5 (ref 5.0–8.0)

## 2017-10-12 LAB — BASIC METABOLIC PANEL
Anion gap: 12 (ref 5–15)
BUN: 23 mg/dL — ABNORMAL HIGH (ref 6–20)
CALCIUM: 8.3 mg/dL — AB (ref 8.9–10.3)
CO2: 21 mmol/L — AB (ref 22–32)
Chloride: 99 mmol/L — ABNORMAL LOW (ref 101–111)
Creatinine, Ser: 1.04 mg/dL (ref 0.61–1.24)
GFR calc Af Amer: >60 mL/min (ref >60)
GFR calc non Af Amer: >60 mL/min (ref >60)
GLUCOSE: 105 mg/dL — AB (ref 65–99)
Potassium: 4.2 mmol/L (ref 3.5–5.1)
SODIUM: 132 mmol/L — AB (ref 135–145)

## 2017-10-12 LAB — COMPREHENSIVE METABOLIC PANEL
ALBUMIN: 3.4 g/dL — AB (ref 3.5–5.0)
ALT: 28 U/L (ref 17–63)
AST: 26 U/L (ref 15–41)
Alkaline Phosphatase: 113 U/L (ref 38–126)
Anion gap: 14 (ref 5–15)
BUN: 29 mg/dL — AB (ref 6–20)
CHLORIDE: 94 mmol/L — AB (ref 101–111)
CO2: 21 mmol/L — AB (ref 22–32)
CREATININE: 1.17 mg/dL (ref 0.61–1.24)
Calcium: 8.8 mg/dL — ABNORMAL LOW (ref 8.9–10.3)
GFR calc Af Amer: >60 mL/min (ref >60)
GLUCOSE: 266 mg/dL — AB (ref 65–99)
Potassium: 4.4 mmol/L (ref 3.5–5.1)
Sodium: 129 mmol/L — ABNORMAL LOW (ref 135–145)
Total Bilirubin: 1.2 mg/dL (ref 0.3–1.2)
Total Protein: 8 g/dL (ref 6.5–8.1)

## 2017-10-12 LAB — CBC WITH DIFFERENTIAL/PLATELET
BASOS PCT: 0 %
Basophils Absolute: 0 10*3/uL (ref 0.0–0.1)
EOS PCT: 0 %
Eosinophils Absolute: 0 10*3/uL (ref 0.0–0.7)
HCT: 39.7 % (ref 39.0–52.0)
Hemoglobin: 13.2 g/dL (ref 13.0–17.0)
LYMPHS ABS: 1.4 10*3/uL (ref 0.7–4.0)
Lymphocytes Relative: 6 %
MCH: 29.9 pg (ref 26.0–34.0)
MCHC: 33.2 g/dL (ref 30.0–36.0)
MCV: 89.8 fL (ref 78.0–100.0)
MONO ABS: 1.4 10*3/uL — AB (ref 0.1–1.0)
Monocytes Relative: 6 %
NEUTROS ABS: 20.7 10*3/uL — AB (ref 1.7–7.7)
Neutrophils Relative %: 88 %
PLATELETS: 351 10*3/uL (ref 150–400)
RBC: 4.42 MIL/uL (ref 4.22–5.81)
RDW: 14.6 % (ref 11.5–15.5)
WBC: 23.5 10*3/uL — AB (ref 4.0–10.5)

## 2017-10-12 LAB — URINALYSIS, MICROSCOPIC (REFLEX): RBC / HPF: NONE SEEN RBC/hpf (ref 0–5)

## 2017-10-12 LAB — APTT: APTT: 41 s — AB (ref 24–36)

## 2017-10-12 LAB — PREALBUMIN

## 2017-10-12 LAB — GLUCOSE, CAPILLARY: GLUCOSE-CAPILLARY: 101 mg/dL — AB (ref 65–99)

## 2017-10-12 LAB — PROTIME-INR
INR: 1.28
Prothrombin Time: 15.9 seconds — ABNORMAL HIGH (ref 11.4–15.2)

## 2017-10-12 LAB — HEMOGLOBIN A1C
Hgb A1c MFr Bld: 8.7 % — ABNORMAL HIGH (ref 4.8–5.6)
MEAN PLASMA GLUCOSE: 202.99 mg/dL

## 2017-10-12 LAB — OSMOLALITY: Osmolality: 280 mOsm/kg (ref 275–295)

## 2017-10-12 LAB — PROCALCITONIN: PROCALCITONIN: 0.35 ng/mL

## 2017-10-12 LAB — BRAIN NATRIURETIC PEPTIDE: B Natriuretic Peptide: 109.7 pg/mL — ABNORMAL HIGH (ref 0.0–100.0)

## 2017-10-12 LAB — CBG MONITORING, ED: Glucose-Capillary: 128 mg/dL — ABNORMAL HIGH (ref 65–99)

## 2017-10-12 LAB — I-STAT CG4 LACTIC ACID, ED
Lactic Acid, Venous: 2.43 mmol/L (ref 0.5–1.9)
Lactic Acid, Venous: 1.27 mmol/L (ref 0.5–1.9)

## 2017-10-12 LAB — C-REACTIVE PROTEIN: CRP: 32.4 mg/dL — ABNORMAL HIGH (ref <1.0)

## 2017-10-12 LAB — LACTIC ACID, PLASMA: Lactic Acid, Venous: 0.9 mmol/L (ref 0.5–1.9)

## 2017-10-12 LAB — SEDIMENTATION RATE: SED RATE: 125 mm/h — AB (ref 0–16)

## 2017-10-12 MED ORDER — METRONIDAZOLE IN NACL 5-0.79 MG/ML-% IV SOLN
500.0000 mg | Freq: Three times a day (TID) | INTRAVENOUS | Status: DC
  Administered 2017-10-12 – 2017-10-18 (×18): 500 mg via INTRAVENOUS
  Filled 2017-10-12 (×19): qty 100

## 2017-10-12 MED ORDER — ACETAMINOPHEN 650 MG RE SUPP: 650.0000 mg | Freq: Four times a day (QID) | RECTAL | Status: DC | PRN

## 2017-10-12 MED ORDER — PIPERACILLIN-TAZOBACTAM 3.375 G IVPB
3.3750 g | Freq: Three times a day (TID) | INTRAVENOUS | Status: DC
  Filled 2017-10-12 (×2): qty 50

## 2017-10-12 MED ORDER — HYDRALAZINE HCL 20 MG/ML IJ SOLN: 5.0000 mg | INTRAMUSCULAR | Status: DC | PRN

## 2017-10-12 MED ORDER — HEPARIN SODIUM (PORCINE) 5000 UNIT/ML IJ SOLN
5000.0000 [IU] | Freq: Three times a day (TID) | INTRAMUSCULAR | Status: DC
  Administered 2017-10-12 – 2017-10-17 (×12): 5000 [IU] via SUBCUTANEOUS
  Filled 2017-10-12 (×12): qty 1

## 2017-10-12 MED ORDER — ATORVASTATIN CALCIUM 80 MG PO TABS
80.0000 mg | ORAL_TABLET | Freq: Every day | ORAL | Status: DC
  Administered 2017-10-12 – 2017-10-20 (×9): 80 mg via ORAL
  Filled 2017-10-12 (×9): qty 1

## 2017-10-12 MED ORDER — VANCOMYCIN HCL IN DEXTROSE 1-5 GM/200ML-% IV SOLN: 1000.0000 mg | Freq: Once | INTRAVENOUS | Status: DC

## 2017-10-12 MED ORDER — ONDANSETRON HCL 4 MG/2ML IJ SOLN
4.0000 mg | Freq: Once | INTRAMUSCULAR | Status: AC
  Administered 2017-10-12: 4 mg via INTRAVENOUS
  Filled 2017-10-12: qty 2

## 2017-10-12 MED ORDER — SACUBITRIL-VALSARTAN 97-103 MG PO TABS
1.0000 | ORAL_TABLET | Freq: Two times a day (BID) | ORAL | Status: DC
  Administered 2017-10-12 – 2017-10-20 (×15): 1 via ORAL
  Filled 2017-10-12 (×16): qty 1

## 2017-10-12 MED ORDER — INSULIN ASPART 100 UNIT/ML ~~LOC~~ SOLN
5.0000 [IU] | Freq: Once | SUBCUTANEOUS | Status: AC
  Administered 2017-10-12: 5 [IU] via SUBCUTANEOUS
  Filled 2017-10-12: qty 1

## 2017-10-12 MED ORDER — SODIUM CHLORIDE 0.9 % IV BOLUS (SEPSIS)
500.0000 mL | Freq: Once | INTRAVENOUS | Status: AC
  Administered 2017-10-12: 500 mL via INTRAVENOUS

## 2017-10-12 MED ORDER — VANCOMYCIN HCL IN DEXTROSE 1-5 GM/200ML-% IV SOLN
1000.0000 mg | Freq: Once | INTRAVENOUS | Status: AC
  Administered 2017-10-12: 1000 mg via INTRAVENOUS

## 2017-10-12 MED ORDER — PIPERACILLIN-TAZOBACTAM 3.375 G IVPB 30 MIN
3.3750 g | Freq: Once | INTRAVENOUS | Status: AC
  Administered 2017-10-12: 3.375 g via INTRAVENOUS
  Filled 2017-10-12 (×2): qty 50

## 2017-10-12 MED ORDER — SODIUM CHLORIDE 0.9 % IV BOLUS (SEPSIS)
2000.0000 mL | Freq: Once | INTRAVENOUS | Status: AC
  Administered 2017-10-12: 2000 mL via INTRAVENOUS

## 2017-10-12 MED ORDER — VANCOMYCIN HCL IN DEXTROSE 1-5 GM/200ML-% IV SOLN
INTRAVENOUS | Status: AC
  Filled 2017-10-12: qty 200

## 2017-10-12 MED ORDER — METOPROLOL SUCCINATE ER 25 MG PO TB24
25.0000 mg | ORAL_TABLET | Freq: Every evening | ORAL | Status: DC
  Administered 2017-10-12 – 2017-10-19 (×8): 25 mg via ORAL
  Filled 2017-10-12 (×8): qty 1

## 2017-10-12 MED ORDER — POLYETHYLENE GLYCOL 3350 17 G PO PACK: 17.0000 g | PACK | Freq: Every day | ORAL | Status: DC | PRN

## 2017-10-12 MED ORDER — INSULIN ASPART 100 UNIT/ML ~~LOC~~ SOLN
0.0000 [IU] | Freq: Every day | SUBCUTANEOUS | Status: DC
  Administered 2017-10-13: 4 [IU] via SUBCUTANEOUS
  Administered 2017-10-17: 2 [IU] via SUBCUTANEOUS
  Administered 2017-10-18: 4 [IU] via SUBCUTANEOUS
  Administered 2017-10-19: 3 [IU] via SUBCUTANEOUS

## 2017-10-12 MED ORDER — VANCOMYCIN HCL 10 G IV SOLR
2000.0000 mg | Freq: Once | INTRAVENOUS | Status: DC
  Filled 2017-10-12: qty 2000

## 2017-10-12 MED ORDER — SODIUM CHLORIDE 0.9 % IV SOLN
2.0000 g | INTRAVENOUS | Status: AC
  Administered 2017-10-12 – 2017-10-19 (×8): 2 g via INTRAVENOUS
  Filled 2017-10-12 (×9): qty 20

## 2017-10-12 MED ORDER — VANCOMYCIN HCL IN DEXTROSE 750-5 MG/150ML-% IV SOLN
750.0000 mg | Freq: Two times a day (BID) | INTRAVENOUS | Status: DC
  Administered 2017-10-13 – 2017-10-16 (×7): 750 mg via INTRAVENOUS
  Filled 2017-10-12 (×9): qty 150

## 2017-10-12 MED ORDER — ASPIRIN 81 MG PO CHEW
81.0000 mg | CHEWABLE_TABLET | ORAL | Status: DC
  Administered 2017-10-13 – 2017-10-16 (×3): 81 mg via ORAL
  Filled 2017-10-12 (×3): qty 1

## 2017-10-12 MED ORDER — ZOLPIDEM TARTRATE 5 MG PO TABS
5.0000 mg | ORAL_TABLET | Freq: Every evening | ORAL | Status: DC | PRN
  Administered 2017-10-13 – 2017-10-19 (×2): 5 mg via ORAL
  Filled 2017-10-12 (×2): qty 1

## 2017-10-12 MED ORDER — ONDANSETRON HCL 4 MG/2ML IJ SOLN
4.0000 mg | Freq: Three times a day (TID) | INTRAMUSCULAR | Status: DC | PRN
Start: 2017-10-12 — End: 2017-10-20
  Administered 2017-10-13: 4 mg via INTRAVENOUS
  Filled 2017-10-12: qty 2

## 2017-10-12 MED ORDER — ACETAMINOPHEN 325 MG PO TABS
650.0000 mg | ORAL_TABLET | Freq: Four times a day (QID) | ORAL | Status: DC | PRN
  Administered 2017-10-13 – 2017-10-14 (×2): 650 mg via ORAL
  Filled 2017-10-12 (×2): qty 2

## 2017-10-12 MED ORDER — VANCOMYCIN HCL IN DEXTROSE 1-5 GM/200ML-% IV SOLN
1000.0000 mg | Freq: Once | INTRAVENOUS | Status: DC
  Filled 2017-10-12: qty 200

## 2017-10-12 MED ORDER — INSULIN ASPART 100 UNIT/ML ~~LOC~~ SOLN
0.0000 [IU] | Freq: Three times a day (TID) | SUBCUTANEOUS | Status: DC
  Administered 2017-10-13 – 2017-10-14 (×3): 2 [IU] via SUBCUTANEOUS
  Administered 2017-10-15: 3 [IU] via SUBCUTANEOUS
  Administered 2017-10-15 – 2017-10-16 (×3): 2 [IU] via SUBCUTANEOUS
  Administered 2017-10-16: 3 [IU] via SUBCUTANEOUS
  Administered 2017-10-16: 2 [IU] via SUBCUTANEOUS
  Administered 2017-10-17: 7 [IU] via SUBCUTANEOUS
  Administered 2017-10-17 (×2): 2 [IU] via SUBCUTANEOUS
  Administered 2017-10-18: 3 [IU] via SUBCUTANEOUS
  Administered 2017-10-18: 1 [IU] via SUBCUTANEOUS
  Administered 2017-10-18: 2 [IU] via SUBCUTANEOUS
  Administered 2017-10-19: 5 [IU] via SUBCUTANEOUS
  Administered 2017-10-19: 7 [IU] via SUBCUTANEOUS
  Administered 2017-10-19: 5 [IU] via SUBCUTANEOUS
  Administered 2017-10-20: 2 [IU] via SUBCUTANEOUS
  Administered 2017-10-20: 3 [IU] via SUBCUTANEOUS

## 2017-10-12 NOTE — ED Provider Notes (Signed)
Complains of painful right great toe over the past few days.  He has had discoloration of his toe for several weeks he reports.  Patient is known diabetic.  No treatment prior to coming here.  On exam he is in no distress alert Glasgow Coma Score 15 lungs clear to auscultation heart mildly tachycardic regular rhythm abdomen nondistended nontender right lower extremity with necrotic appearing great toe and distal half of foot is reddened, warm and swollen.  DP pulse one plus.  Left lower extremity without redness swelling or tenderness neurovascular intact.  All other extremities without redness swelling or tenderness neurovascularly intact. Code sepsis called based on sirs criteria of temperature and respiratory rate source of infection right foot.  Patient reports his last tetanus immunization was 2 weeks ago   Orlie Dakin, MD 10/12/17 [phone]

## 2017-10-12 NOTE — Progress Notes (Signed)
Pharmacy Antibiotic Note  Brian Herring is a 62 y.o. male admitted on 10/12/2017 with sepsis/cellulitis.  Pharmacy has been consulted for vancomycin and Zosyn dosing.  Plan: - vancomycin 2 G load x 1 (Hang 1 gram bag followed by another 1 gram bag to equal 2 gram load; RN notified) - vancomycin 750 mg every 12 hours - goal trough 15 -20 mcg/mL  - Zosyn 3.375 G 30 min infusion x 1 - Zosyn 3.375 G 4 hour infusion every 8 hours  - monitor clinical progression, length of therapy, renal function, and vancomycin trough as needed  Height: 6' (182.9 cm) Weight: 240 lb (108.9 kg) IBW/kg (Calculated) : 77.6  Temp (24hrs), Avg:100.1 F (37.8 C), Min:100.1 F (37.8 C), Max:100.1 F (37.8 C)  Recent Labs  Lab 10/12/17 1315 10/12/17 1319  WBC 23.5*  --   CREATININE 1.17  --   LATICACIDVEN  --  2.43*    Estimated Creatinine Clearance: 84.5 mL/min (by C-G formula based on SCr of 1.17 mg/dL).    Allergies  Allergen Reactions  . Sulfur Rash    Antimicrobials this admission: Vancomycin 3/21 >>  Zosyn 3/21 >>   Dose adjustments this admission: N/A  Microbiology results: 3/21 BCx: pending   Thank you for allowing pharmacy to be a part of this patient's care.  Deboraha Sprang, PharmD 10/12/2017 2:27 PM

## 2017-10-12 NOTE — ED Notes (Signed)
Patient transported to X-ray 

## 2017-10-12 NOTE — Progress Notes (Signed)
Patient admitted to 5W24 via [facility]. Admitted from [facility]. Patient from Home alone. Placed on Tele # [phone]. Vitals obtained and placed on cont. Pulse ox. A&O x 4. Skin assessed. Oriented patient to room, call bell, and how to reach staff. He verbalized understanding. Oncoming nurse in room with me while completing the above.

## 2017-10-12 NOTE — ED Notes (Signed)
Patient transported to CT 

## 2017-10-12 NOTE — Progress Notes (Signed)
  Mr. Brian Herring is a 62 year old male with extensive history of diabetes mellitus, hypertension, hyperlipidemia presented to [place] at [facility] for right diabetic foot ulcer nonhealing wound. Right foot/great toe wound, with erythema edema of the foot, ulcer, ganglionic, and eschar on the right great toe. On initial evaluation nation met the criteria for sepsis: Mildly hypotensive, tachycardic, W BC of 23.5, lactic acid 2.43, sodium of 129) Blood cultures has been obtained, broad-spectrum antibiotics has been initiated.   Patient will be admitted for:  Sepsis secondary to diabetic right foot -  right foot cellulitis/gangrene/osteomyelitis of  right great toe. -Hyponatremia -Hyperglycemia  Anticipating consultation to orthopedic team upon arrival and infectious disease team.  The case was discussed in detail with physician assistant Ms. Wyn Quaker. He states the patient is currently hemodynamically stable.  Foot x-ray IMPRESSION: 1. Soft tissue emphysema in the soft tissue surrounding the great toe extending into the remainder of the forefoot concerning for gas-forming infection. 2. Partial osteolysis of the distal medial corner of the first proximal phalanx with a nondisplaced fracture concerning for underlying osteomyelitis.

## 2017-10-12 NOTE — ED Notes (Signed)
Informed Amy Petra Kuba RN of code sepsis

## 2017-10-12 NOTE — ED Notes (Signed)
Noted that the Vancomycin was on the infusion pump. The infusion pump signaled it was finished, but medication remained in bag. Noted that all lines were unclamped. Checked connections and Vancomycin began infusing at this time.

## 2017-10-12 NOTE — H&P (Addendum)
History and Physical    Brian Herring:[id number] DOB: 22-Mar-1956 DOA: 10/12/2017  Referring MD/NP/PA:   PCP: [name], MD   Patient coming from:  The patient is coming from home.  At baseline, pt is independent for most of ADL.        Chief Complaint: right foot ulcer with infection  HPI: Brian Herring is a 62 y.o. male with medical history significant of hypertension, hyperlipidemia, diabetes mellitus, sCHF with EF 25%, who presents with right foot ulcer with infection.  Pt states that he has right great toe ulcer which has been going on for more than 6 months. About one week ago, he noticed purple-black color change in his right great toe. His right foot becomes swelling, erythematous with foul odors coming from his left great toe. He has not tried anything at home. He also has into meeting to fever and chills. He has mild tenderness in the right foot. Patient denies chest pain, shortness breath, cough. He has nausea, no vomiting, diarrhea or abdominal pain. Denies symptoms of UTI or unilateral weakness.  ED Course: pt was found to have WBC 23.5, sodium 129,creatinine 1.17, lactic acid 1.27, temperature 122, tachycardia, tachypnea, oxygen saturation 92% on room air. Patient is admitted to telemetry bed as inpatient.  X-ray of right foot: 1. Soft tissue emphysema in the soft tissue surrounding the great toe extending into the remainder of the forefoot concerning for gas-forming infection. 2. Partial osteolysis of the distal medial corner of the first proximal phalanx with a nondisplaced fracture concerning for underlying osteomyelitis.   Review of Systems:   General: has fevers, chills, no body weight gain, has poor appetite, has fatigue HEENT: no blurry vision, hearing changes or sore throat Respiratory: no dyspnea, coughing, wheezing CV: no chest pain, no palpitations GI: has nausea, no vomiting, abdominal pain, diarrhea, constipation GU: no dysuria, burning on  urination, increased urinary frequency, hematuria  Ext: has right foot and lower leg swelling Neuro: no unilateral weakness, numbness, or tingling, no vision change or hearing loss Skin: no rash, no skin tear. MSK: No muscle spasm, no deformity, no limitation of range of movement in spin Heme: No easy bruising.  Travel history: No recent long distant travel.  Allergy:  Allergies  Allergen Reactions  . Sulfur Rash    Past Medical History:  Diagnosis Date  . Chronic systolic CHF (congestive heart failure) ([place])   . Diabetes mellitus without complication ([place])   . Hyperlipidemia   . Hypertension     Past Surgical History:  Procedure Laterality Date  . SHOULDER SURGERY  01/05    Social History:  reports that he has never smoked. He has never used smokeless tobacco. He reports that he does not drink alcohol. His drug history is not on file.  Family History:  Family History  Problem Relation Age of Onset  . Pneumonia Mother        aspiration     Prior to Admission medications   Medication Sig Start Date End Date Taking? Authorizing Provider  acarbose (PRECOSE) 25 MG tablet Take 1 tablet by mouth 3 (three) times daily with meals.  06/24/13  Yes [provider]  aspirin 81 MG tablet Take 81 mg by mouth every morning.    Yes [provider]  Empagliflozin (JARDIANCE PO) Take 25 mg by mouth every morning.    Yes [provider]  furosemide (LASIX) 40 MG tablet Take 40 mg by mouth daily as needed.  Yes [provider]  glimepiride (AMARYL) 4 MG tablet Take 1 tablet by mouth every morning.  07/02/14  Yes [provider]  ibuprofen (ADVIL,MOTRIN) 200 MG tablet Take 200 mg by mouth every 6 (six) hours as needed for fever, headache, mild pain or moderate pain.   Yes [provider]  metFORMIN (GLUCOPHAGE) 500 MG tablet Take 1,000 mg by mouth 2 (two) times daily with a meal.    Yes [provider]  metoprolol succinate  (TOPROL-XL) 50 MG 24 hr tablet Take 1 tablet (50 mg total) by mouth daily. Take with or immediately following a meal. Patient taking differently: Take 25 mg by mouth every evening. Take with or immediately following a meal. 12/15/15  Yes Troy Sine, MD  rosuvastatin (CRESTOR) 10 MG tablet Take 10 mg by mouth every evening.    Yes [provider]  sacubitril-valsartan (ENTRESTO) 97-103 MG Take 1 tablet by mouth 2 (two) times daily.   Yes [provider]  spironolactone (ALDACTONE) 25 MG tablet Take 1 tablet (25 mg total) by mouth 2 (two) times daily. Patient taking differently: Take 25 mg by mouth every evening.  09/16/15  Yes Troy Sine, MD  amLODipine (NORVASC) 5 MG tablet Take 1 tablet (5 mg total) by mouth daily. Patient not taking: Reported on 10/12/2017 09/16/15   Troy Sine, MD  atorvastatin (LIPITOR) 80 MG tablet Take 1 tablet (80 mg total) by mouth daily. Patient not taking: Reported on 10/12/2017 09/16/15   Troy Sine, MD  ivabradine ([name]) 7.5 MG TABS tablet Take 1 tablet (7.5 mg total) by mouth 2 (two) times daily with a meal. Patient not taking: Reported on 10/12/2017 12/15/15   Troy Sine, MD  KOMBIGLYZE XR 2.11-998 MG TB24 Take 1 tablet by mouth 2 (two) times daily. 12/11/15   [provider]  ramipril (ALTACE) 10 MG capsule Take 1 capsule (10 mg total) by mouth daily. Patient not taking: Reported on 10/12/2017 09/16/15   Troy Sine, MD    Physical Exam: Vitals:   10/12/17 1700 10/12/17 1730 10/12/17 1801 10/12/17 1917  BP: 126/71 120/70 (!) 106/59 109/62  Pulse: (!) 107 (!) 110 (!) 106 (!) 106  Resp: (!) 23 20 (!) 21 20  Temp:   100.1 F (37.8 C) 100.2 F (37.9 C)  TempSrc:   Oral Oral  SpO2: 92% 95% 94% 94%  Weight:      Height:       General: Not in acute distress HEENT:       Eyes: PERRL, EOMI, no scleral icterus.       ENT: No discharge from the ears and nose, no pharynx injection, no tonsillar enlargement.         Neck: No JVD, no bruit, no mass felt. Heme: No neck lymph node enlargement. Cardiac: S1/S2, RRR, No murmurs, No gallops or rubs. Respiratory: No rales, wheezing, rhonchi or rubs. GI: Soft, nondistended, nontender, no rebound pain, no organomegaly, BS present. GU: No hematuria Ext: 1+DP/PT pulse bilaterally. There is a large ulcer in right great toe, with foul odor. The R great toe is in dusky-purplish color, with no capillary refill. No active draining. The right foot is swelling, tender and erythematous. The R foot swelling is extending up to ankle and lower leg. Musculoskeletal: No joint deformities, No joint redness or warmth, no limitation of ROM in spin. Skin: No rashes. Has R great toe ulcer Neuro: Alert, oriented X3, cranial nerves II-XII grossly intact, moves all  extremities normally. Psych: Patient is not psychotic, no suicidal or hemocidal ideation.  Labs on Admission: I have personally reviewed following labs and imaging studies  CBC: Recent Labs  Lab 10/12/17 1315  WBC 23.5*  NEUTROABS 20.7*  HGB 13.2  HCT 39.7  MCV 89.8  PLT 748   Basic Metabolic Panel: Recent Labs  Lab 10/12/17 1315  NA 129*  K 4.4  CL 94*  CO2 21*  GLUCOSE 266*  BUN 29*  CREATININE 1.17  CALCIUM 8.8*   GFR: Estimated Creatinine Clearance: 84.5 mL/min (by C-G formula based on SCr of 1.17 mg/dL). Liver Function Tests: Recent Labs  Lab 10/12/17 1315  AST 26  ALT 28  ALKPHOS 113  BILITOT 1.2  PROT 8.0  ALBUMIN 3.4*   No results for input(s): LIPASE, AMYLASE in the last 168 hours. No results for input(s): AMMONIA in the last 168 hours. Coagulation Profile: No results for input(s): INR, PROTIME in the last 168 hours. Cardiac Enzymes: No results for input(s): CKTOTAL, CKMB, CKMBINDEX, TROPONINI in the last 168 hours. BNP (last 3 results) No results for input(s): PROBNP in the last 8760 hours. HbA1C: No results for input(s): HGBA1C in the last 72 hours. CBG: Recent Labs  Lab  10/12/17 1720  GLUCAP 128*   Lipid Profile: No results for input(s): CHOL, HDL, LDLCALC, TRIG, CHOLHDL, LDLDIRECT in the last 72 hours. Thyroid Function Tests: No results for input(s): TSH, T4TOTAL, FREET4, T3FREE, THYROIDAB in the last 72 hours. Anemia Panel: No results for input(s): VITAMINB12, FOLATE, FERRITIN, TIBC, IRON, RETICCTPCT in the last 72 hours. Urine analysis:    Component Value Date/Time   COLORURINE YELLOW 10/12/2017 [place] 10/12/2017 1348   LABSPEC <1.005 (L) 10/12/2017 1348   PHURINE 5.5 10/12/2017 1348   GLUCOSEU >=500 (A) 10/12/2017 1348   HGBUR NEGATIVE 10/12/2017 1348   BILIRUBINUR NEGATIVE 10/12/2017 1348   KETONESUR 15 (A) 10/12/2017 1348   PROTEINUR NEGATIVE 10/12/2017 1348   NITRITE NEGATIVE 10/12/2017 1348   LEUKOCYTESUR NEGATIVE 10/12/2017 1348   Sepsis Labs: '@LABRCNTIP' (procalcitonin:4,lacticidven:4) )No results found for this or any previous visit (from the past 240 hour(s)).   Radiological Exams on Admission: Dg Foot Complete Right  Result Date: 10/12/2017 CLINICAL DATA:  Right great toe wound. Redness, painful and swollen. EXAM: RIGHT FOOT COMPLETE - 3+ VIEW COMPARISON:  None. FINDINGS: No acute fracture or dislocation. Partial osteolysis of distal medial corner of the first proximal phalanx with a nondisplaced fracture. Soft tissue emphysema in the soft tissue surrounding the great toe extending into the remainder of the forefoot along the medial aspect with soft tissue swelling. No other fracture or dislocation. No periosteal reaction. Normal alignment. IMPRESSION: 1. Soft tissue emphysema in the soft tissue surrounding the great toe extending into the remainder of the forefoot concerning for gas-forming infection. 2. Partial osteolysis of the distal medial corner of the first proximal phalanx with a nondisplaced fracture concerning for underlying osteomyelitis. Electronically Signed   By: Kathreen Devoid   On: 10/12/2017 14:04      EKG: Independently reviewed.  Sinus rhythm, QTC 455, LAD, low voltage.  Assessment/Plan Principal Problem:   Diabetic foot infection ([place]) Active Problems:   Hyperlipidemia   HTN (hypertension), benign   Poorly controlled diabetes mellitus ([place])   Sepsis ([place])   Diabetic foot ulcer ([place])   Hyponatremia   Chronic systolic CHF (congestive heart failure) ([facility])   Cellulitis of right foot   Sepsis due to right foot ulcer with infection and right foot  cellulitis: pt meets criteria for sepsis with leukocytosis, fever, tachycardia and tachypnea. Lactic acid is normal. Currently blood pressure is soft, but hemodynamically stable. X-ray findings are concerning for gas-forming infection and osteomyelitis.  - will admit to tele bed as inpt - Empiric antimicrobial treatment with vancomycin,Rocephin, Flagyl (patient received one dose of Zosyn in ED). - PRN Zofran for nausea, tylenol for pain - Blood cultures x 2  - ESR and CRP - wound care consult - MRI-right foot - will get Procalcitonin and trend lactic acid levels per sepsis protocol. - IVF: 3.0 L of NS bolus in ED (patient has congestive heart failure, limiting aggressive IV fluids treatment). - INR/PTT - Please consult ortho in AM  HLD: -Lipitor  HTN:  -Continue home medications: Entresto and metoprolol -IV hydralazine prn  Poorly controlled diabetes mellitus with complications of foot ulcer: Last A1c 8.8 on 08/14/15, poorly controled. Patient is taking Kombiglyze, acarbose, jardiance, metformin and Amarylat home -hold oral meds -SSI -Check P1A  Chronic systolic CHF: 2-D echo on 2/80/17 showed EF 25-30%. Patient does not have shortness of breath, no JVD. CHF seems to be compensated. Pt may need to f/u with card for evaluation for possible ICD placement. -Hold Lasix and spironolactone due to sepsis -Continue aspirin and metoprolol -continue Enresto with holding parameter for SBP<90  Hyponatremia: Na129. Likely due touse of  the Lasix and spironolactone. Enresto use may have also contributed partially.mental status normal. - Will check urine sodium, urine osmolality, serum osmolality. - check TSH - IVF: pt has received 3L NS in ED - repeat BMP now and in AM    DVT ppx: SQ Heparin    Code Status: Full code Family Communication: None at bed side.   Disposition Plan:  Anticipate discharge back to previous home environment Consults called:  none Admission status:  Inpatient/tele       Date of Service 10/12/2017    Brian Herring [facility] Pager [phone]  If 7PM-7AM, please contact night-coverage [email or web address] Password [facility] 10/12/2017, 9:09 PM

## 2017-10-12 NOTE — ED Notes (Signed)
EDP did not want to admin total fluid bolus due to hx of CHF. 2000 mL d/c total intake was 100 mL. See order change.

## 2017-10-12 NOTE — ED Provider Notes (Signed)
[facility] EMERGENCY DEPARTMENT Provider Note   CSN: [id number] Arrival date & time: 10/12/17  1202     History   Chief Complaint Chief Complaint  Patient presents with  . Abscess    HPI ABIR CRAINE is a 62 y.o. male with a history of DM, hypertension, hyperlipidemia, who presents today for evaluation of a foot wound.  He reports that since late last year he has had a wound on his foot that he has had a difficult time getting healed, he reports that it initially started out as a blood blister.  He reports that since Tuesday he has had increased redness and foul odors coming from his left great toe.  He reports occasional chills, no fevers.  He reports that he does not check his blood sugars like he should be.  He has not tried anything at home prior to arrival.  He reports that he has feeling in his feet however denies his toe hurting.    HPI  Past Medical History:  Diagnosis Date  . Diabetes mellitus without complication ([place])   . Hyperlipidemia   . Hypertension     Patient Active Problem List   Diagnosis Date Noted  . Hyperkalemia 10/13/2015  . Congestive dilated cardiomyopathy ([place]) 07/13/2015  . Asymptomatic LV dysfunction 06/25/2015  . Poorly controlled diabetes mellitus ([place]) 05/31/2015  . HTN (hypertension), benign 07/04/2014  . Viral cardiomyopathy, EF improved to 45-50% 04/02/2013  . Obesity 04/02/2013  . DM2 (diabetes mellitus, type 2) ([place]) 04/02/2013  . Hyperlipidemia 04/02/2013  . Sinus tachycardia 04/02/2013    Past Surgical History:  Procedure Laterality Date  . SHOULDER SURGERY  01/05       Home Medications    Prior to Admission medications   Medication Sig Start Date End Date Taking? Authorizing Provider  acarbose (PRECOSE) 25 MG tablet Take 1 tablet by mouth 3 (three) times daily with meals.  06/24/13  Yes [provider]  aspirin 81 MG tablet Take 81 mg by mouth daily.   Yes [provider]  Empagliflozin  (JARDIANCE PO) Take by mouth.   Yes [provider]  furosemide (LASIX) 40 MG tablet Take 40 mg by mouth.   Yes [provider]  glimepiride (AMARYL) 4 MG tablet Take 1 tablet by mouth daily. 07/02/14  Yes [provider]  metFORMIN (GLUCOPHAGE) 500 MG tablet Take by mouth 2 (two) times daily with a meal.   Yes [provider]  metoprolol succinate (TOPROL-XL) 50 MG 24 hr tablet Take 1 tablet (50 mg total) by mouth daily. Take with or immediately following a meal. 12/15/15  Yes Troy Sine, MD  rosuvastatin (CRESTOR) 10 MG tablet Take 10 mg by mouth daily.   Yes [provider]  spironolactone (ALDACTONE) 25 MG tablet Take 1 tablet (25 mg total) by mouth 2 (two) times daily. 09/16/15  Yes Troy Sine, MD  amLODipine (NORVASC) 5 MG tablet Take 1 tablet (5 mg total) by mouth daily. 09/16/15   Troy Sine, MD  atorvastatin (LIPITOR) 80 MG tablet Take 1 tablet (80 mg total) by mouth daily. 09/16/15   Troy Sine, MD  ivabradine ([name]) 7.5 MG TABS tablet Take 1 tablet (7.5 mg total) by mouth 2 (two) times daily with a meal. 12/15/15   Troy Sine, MD  KOMBIGLYZE XR 2.11-998 MG TB24 Take 1 tablet by mouth 2 (two) times daily. 12/11/15   [provider]  Multiple Vitamins-Minerals (MULTIVITAL PO) Take by mouth.  [provider]  ramipril (ALTACE) 10 MG capsule Take 1 capsule (10 mg total) by mouth daily. 09/16/15   Troy Sine, MD    Family History Family History  Problem Relation Age of Onset  . Pneumonia Mother        aspiration    Social History Social History   Tobacco Use  . Smoking status: Never Smoker  . Smokeless tobacco: Never Used  Substance Use Topics  . Alcohol use: No    Alcohol/week: 0.0 oz  . Drug use: Not on file     Allergies   Sulfur   Review of Systems Review of Systems   Physical Exam Updated Vital Signs BP (!) 121/105   Pulse (!) 109   Temp 100.1 F (37.8 C) (Oral)    Resp 15   Ht 6' (1.829 m)   Wt 108.9 kg (240 lb)   SpO2 96%   BMI 32.55 kg/m   Physical Exam  Constitutional: He appears well-developed and well-nourished.  HENT:  Head: Normocephalic and atraumatic.  Eyes: Conjunctivae are normal.  Neck: Neck supple.  Cardiovascular: Normal rate, regular rhythm and intact distal pulses.  No murmur heard. Pulses:      Dorsalis pedis pulses are 1+ on the right side, and 1+ on the left side.       Posterior tibial pulses are 1+ on the right side, and 1+ on the left side.  Pulmonary/Chest: Effort normal and breath sounds normal. No respiratory distress.  Abdominal: Soft. Normal appearance. There is no tenderness.  Musculoskeletal: He exhibits no edema.  Neurological: He is alert.  Skin: Skin is warm and dry. He is not diaphoretic.  Skin over right great toe is dusky with no capillary refill.  There is a large wound with foul odor on the toe.  Please see clinical images. There is surrounding erythema and edema.    Psychiatric: He has a normal mood and affect.  Nursing note and vitals reviewed.               ED Treatments / Results  Labs (all labs ordered are listed, but only abnormal results are displayed) Labs Reviewed  COMPREHENSIVE METABOLIC PANEL - Abnormal; Notable for the following components:      Result Value   Sodium 129 (*)    Chloride 94 (*)    CO2 21 (*)    Glucose, Bld 266 (*)    BUN 29 (*)    Calcium 8.8 (*)    Albumin 3.4 (*)    All other components within normal limits  CBC WITH DIFFERENTIAL/PLATELET - Abnormal; Notable for the following components:   WBC 23.5 (*)    Neutro Abs 20.7 (*)    Monocytes Absolute 1.4 (*)    All other components within normal limits  URINALYSIS, ROUTINE W REFLEX MICROSCOPIC - Abnormal; Notable for the following components:   Specific Gravity, Urine <1.005 (*)    Glucose, UA >=500 (*)    Ketones, ur 15 (*)    All other components within normal limits  URINALYSIS, MICROSCOPIC  (REFLEX) - Abnormal; Notable for the following components:   Bacteria, UA RARE (*)    Squamous Epithelial / LPF 0-5 (*)    All other components within normal limits  I-STAT CG4 LACTIC ACID, ED - Abnormal; Notable for the following components:   Lactic Acid, Venous 2.43 (*)    All other components within normal limits  CBG MONITORING, ED - Abnormal; Notable for the following components:  Glucose-Capillary 128 (*)    All other components within normal limits  CULTURE, BLOOD (ROUTINE X 2)  CULTURE, BLOOD (ROUTINE X 2)  I-STAT CG4 LACTIC ACID, ED    EKG  EKG Interpretation None       Radiology Dg Foot Complete Right  Result Date: 10/12/2017 CLINICAL DATA:  Right great toe wound. Redness, painful and swollen. EXAM: RIGHT FOOT COMPLETE - 3+ VIEW COMPARISON:  None. FINDINGS: No acute fracture or dislocation. Partial osteolysis of distal medial corner of the first proximal phalanx with a nondisplaced fracture. Soft tissue emphysema in the soft tissue surrounding the great toe extending into the remainder of the forefoot along the medial aspect with soft tissue swelling. No other fracture or dislocation. No periosteal reaction. Normal alignment. IMPRESSION: 1. Soft tissue emphysema in the soft tissue surrounding the great toe extending into the remainder of the forefoot concerning for gas-forming infection. 2. Partial osteolysis of the distal medial corner of the first proximal phalanx with a nondisplaced fracture concerning for underlying osteomyelitis. Electronically Signed   By: Kathreen Devoid   On: 10/12/2017 14:04    Procedures Procedures (including critical care time)  Medications Ordered in ED Medications  sodium chloride 0.9 % bolus 500 mL (has no administration in time range)  piperacillin-tazobactam (ZOSYN) IVPB 3.375 g (has no administration in time range)  vancomycin (VANCOCIN) IVPB 750 mg/150 ml premix (has no administration in time range)  vancomycin (VANCOCIN) IVPB 1000  mg/200 mL premix (1,000 mg Intravenous New Bag/Given 10/12/17 1445)  vancomycin (VANCOCIN) IVPB 1000 mg/200 mL premix (has no administration in time range)  piperacillin-tazobactam (ZOSYN) IVPB 3.375 g (0 g Intravenous Stopped 10/12/17 1441)  sodium chloride 0.9 % bolus 2,000 mL (0 mLs Intravenous Stopped 10/12/17 1417)  2,000 saline bolus not given.    Initial Impression / Assessment and Plan / ED Course  I have reviewed the triage vital signs and the nursing notes.  Pertinent labs & imaging results that were available during my care of the patient were reviewed by me and considered in my medical decision making (see chart for details).  Clinical Course as of Oct 13 2018  Thu Oct 12, 2017  1450 Sepsis reassessment completed.    [EH]  1536 Spoke with hospitalist who will accept patient.    [EH]    Clinical Course User Index [EH] Ollen Gross   Standley Dakins presents today for evaluation of a reported toe abscess.  He was found to meet sepsis criteria as he was tachycardic, and tachypneic.  His white count was elevated.  He was started on broad-spectrum antibiotics, suspected source is diabetic foot wound on right great toe.  The distal tip of right great toe appears necrotic, no capillary refill present.  Distal pulses are intact.  The area of surrounding erythema was marked with a skin marker.  Blood cultures were obtained prior to starting IV antibiotics.  X-rays were obtained showing that there is subcutaneous air and concern for osteomyelitis based on nontraumatic fracture.  Patient was informed of all of these results.  He was not given the full 30/kg fluid bolus as he was not hypotensive and his lactic was under 4.  Repeat lactic was significantly improved.  I spoke with the hospitalist at [facility] who agreed to admit patient.  Patient was transferred by ambulance to [facility] for further evaluation.  This patient was seen as a shared visit with Dr. Winfred Leeds who  evaluated the patient and agreed with my  plan.  Final Clinical Impressions(s) / ED Diagnoses   Final diagnoses:  Sepsis, due to unspecified organism [facility])  Diabetic foot ulcer with osteomyelitis [facility])    ED Discharge Orders    None       Ollen Gross 10/12/17 2022    Orlie Dakin, MD 10/13/17 [phone]

## 2017-10-12 NOTE — ED Notes (Signed)
[facility] at bedside to transport pt at this time 

## 2017-10-12 NOTE — ED Triage Notes (Signed)
Wound to his right great toe for a "long time". Foot is red, painful and swollen. Hx diabetes. He saw his MD 2 days ago and he is aware of pts wound. Told him to come here for evaluation.

## 2017-10-12 NOTE — ED Notes (Signed)
Date and time results received: 10/12/17 1323 (use smartphrase ".now" to insert current time)  Test: lactic acid Critical Value:2.43  Name of Provider Notified:Jacubowitz Orders Received? Or Actions Taken?: no orders given

## 2017-10-13 ENCOUNTER — Inpatient Hospital Stay (HOSPITAL_COMMUNITY): Payer: BLUE CROSS/BLUE SHIELD

## 2017-10-13 ENCOUNTER — Other Ambulatory Visit: Payer: Self-pay

## 2017-10-13 DIAGNOSIS — E11621 Type 2 diabetes mellitus with foot ulcer: Secondary | ICD-10-CM

## 2017-10-13 LAB — OSMOLALITY, URINE: Osmolality, Ur: 668 mOsm/kg (ref 300–900)

## 2017-10-13 LAB — BASIC METABOLIC PANEL
Anion gap: 11 (ref 5–15)
BUN: 25 mg/dL — ABNORMAL HIGH (ref 6–20)
CO2: 22 mmol/L (ref 22–32)
CREATININE: 1.11 mg/dL (ref 0.61–1.24)
Calcium: 8 mg/dL — ABNORMAL LOW (ref 8.9–10.3)
Chloride: 97 mmol/L — ABNORMAL LOW (ref 101–111)
GFR calc Af Amer: >60 mL/min (ref >60)
GLUCOSE: 178 mg/dL — AB (ref 65–99)
Potassium: 4.7 mmol/L (ref 3.5–5.1)
Sodium: 130 mmol/L — ABNORMAL LOW (ref 135–145)

## 2017-10-13 LAB — GLUCOSE, CAPILLARY
GLUCOSE-CAPILLARY: 110 mg/dL — AB (ref 65–99)
GLUCOSE-CAPILLARY: 192 mg/dL — AB (ref 65–99)
Glucose-Capillary: 170 mg/dL — ABNORMAL HIGH (ref 65–99)
Glucose-Capillary: 320 mg/dL — ABNORMAL HIGH (ref 65–99)
Glucose-Capillary: 90 mg/dL (ref 65–99)

## 2017-10-13 LAB — CBC
HCT: 35.4 % — ABNORMAL LOW (ref 39.0–52.0)
Hemoglobin: 11.3 g/dL — ABNORMAL LOW (ref 13.0–17.0)
MCH: 29.1 pg (ref 26.0–34.0)
MCHC: 31.9 g/dL (ref 30.0–36.0)
MCV: 91.2 fL (ref 78.0–100.0)
Platelets: 333 10*3/uL (ref 150–400)
RBC: 3.88 MIL/uL — ABNORMAL LOW (ref 4.22–5.81)
RDW: 14.7 % (ref 11.5–15.5)
WBC: 22.1 10*3/uL — ABNORMAL HIGH (ref 4.0–10.5)

## 2017-10-13 LAB — LACTIC ACID, PLASMA: LACTIC ACID, VENOUS: 1.4 mmol/L (ref 0.5–1.9)

## 2017-10-13 LAB — SODIUM, URINE, RANDOM: Sodium, Ur: 11 mmol/L

## 2017-10-13 LAB — HIV ANTIBODY (ROUTINE TESTING W REFLEX): HIV SCREEN 4TH GENERATION: NONREACTIVE

## 2017-10-13 LAB — TSH: TSH: 1.297 u[IU]/mL (ref 0.350–4.500)

## 2017-10-13 LAB — MRSA PCR SCREENING: MRSA BY PCR: NEGATIVE

## 2017-10-13 MED ORDER — GADOBENATE DIMEGLUMINE 529 MG/ML IV SOLN
20.0000 mL | Freq: Once | INTRAVENOUS | Status: AC | PRN
  Administered 2017-10-13: 20 mL via INTRAVENOUS

## 2017-10-13 MED ORDER — PROMETHAZINE HCL 25 MG/ML IJ SOLN
12.5000 mg | Freq: Four times a day (QID) | INTRAMUSCULAR | Status: DC | PRN
  Administered 2017-10-13: 12.5 mg via INTRAVENOUS
  Filled 2017-10-13: qty 1

## 2017-10-13 MED ORDER — PRO-STAT SUGAR FREE PO LIQD
30.0000 mL | Freq: Two times a day (BID) | ORAL | Status: DC
  Administered 2017-10-13 – 2017-10-20 (×13): 30 mL via ORAL
  Filled 2017-10-13 (×13): qty 30

## 2017-10-13 MED ORDER — LIVING WELL WITH DIABETES BOOK
Freq: Once | Status: DC
  Filled 2017-10-13: qty 1

## 2017-10-13 NOTE — Progress Notes (Signed)
ABIs and TBIs bilaterally completed. Right ABI 1.09 TBI could not be ascertained due to condition of the toe. Left ABI 1.17 TBI 1.32 Bilateral ABIs are within normal limits. [facility], [place] 10/13/2017. 4:05 PM

## 2017-10-13 NOTE — Consult Note (Signed)
ORTHOPAEDIC CONSULTATION  REQUESTING PHYSICIAN: Cristal Ford, DO  Chief Complaint: Chronic gangrenous ulcer right great toe.  HPI: Brian Herring is a 62 y.o. male who presents with chronic gangrenous ulcer right great toe.  Patient states that he has had this for several months.  Past Medical History:  Diagnosis Date  . Chronic systolic CHF (congestive heart failure) ([place])   . Diabetes mellitus without complication ([place])   . Hyperlipidemia   . Hypertension    Past Surgical History:  Procedure Laterality Date  . SHOULDER SURGERY  01/05   Social History   Socioeconomic History  . Marital status: Single    Spouse name: Not on file  . Number of children: Not on file  . Years of education: Not on file  . Highest education level: Not on file  Occupational History  . Not on file  Social Needs  . Financial resource strain: Not on file  . Food insecurity:    Worry: Not on file    Inability: Not on file  . Transportation needs:    Medical: Not on file    Non-medical: Not on file  Tobacco Use  . Smoking status: Never Smoker  . Smokeless tobacco: Never Used  Substance and Sexual Activity  . Alcohol use: No    Alcohol/week: 0.0 oz  . Drug use: Not on file  . Sexual activity: Not on file  Lifestyle  . Physical activity:    Days per week: Not on file    Minutes per session: Not on file  . Stress: Not on file  Relationships  . Social connections:    Talks on phone: Not on file    Gets together: Not on file    Attends religious service: Not on file    Active member of club or organization: Not on file    Attends meetings of clubs or organizations: Not on file    Relationship status: Not on file  Other Topics Concern  . Not on file  Social History Narrative  . Not on file   Family History  Problem Relation Age of Onset  . Pneumonia Mother        aspiration   - negative except otherwise stated in the family history section Allergies  Allergen Reactions    . Sulfur Rash   Prior to Admission medications   Medication Sig Start Date End Date Taking? Authorizing Provider  acarbose (PRECOSE) 25 MG tablet Take 1 tablet by mouth 3 (three) times daily with meals.  06/24/13  Yes [provider]  aspirin 81 MG tablet Take 81 mg by mouth every morning.    Yes [provider]  Empagliflozin (JARDIANCE PO) Take 25 mg by mouth every morning.    Yes [provider]  furosemide (LASIX) 40 MG tablet Take 40 mg by mouth daily as needed.    Yes [provider]  glimepiride (AMARYL) 4 MG tablet Take 1 tablet by mouth every morning.  07/02/14  Yes [provider]  ibuprofen (ADVIL,MOTRIN) 200 MG tablet Take 200 mg by mouth every 6 (six) hours as needed for fever, headache, mild pain or moderate pain.   Yes [provider]  metFORMIN (GLUCOPHAGE) 500 MG tablet Take 1,000 mg by mouth 2 (two) times daily with a meal.    Yes [provider]  metoprolol succinate (TOPROL-XL) 50 MG 24 hr tablet Take 1 tablet (50 mg total) by mouth daily. Take with or immediately following a meal. Patient taking differently:  Take 25 mg by mouth every evening. Take with or immediately following a meal. 12/15/15  Yes Troy Sine, MD  rosuvastatin (CRESTOR) 10 MG tablet Take 10 mg by mouth every evening.    Yes [provider]  sacubitril-valsartan (ENTRESTO) 97-103 MG Take 1 tablet by mouth 2 (two) times daily.   Yes [provider]  spironolactone (ALDACTONE) 25 MG tablet Take 1 tablet (25 mg total) by mouth 2 (two) times daily. Patient taking differently: Take 25 mg by mouth every evening.  09/16/15  Yes Troy Sine, MD  amLODipine (NORVASC) 5 MG tablet Take 1 tablet (5 mg total) by mouth daily. Patient not taking: Reported on 10/12/2017 09/16/15   Troy Sine, MD  atorvastatin (LIPITOR) 80 MG tablet Take 1 tablet (80 mg total) by mouth daily. Patient not taking: Reported on 10/12/2017 09/16/15   Troy Sine, MD  ivabradine ([name]) 7.5 MG TABS tablet Take 1 tablet (7.5 mg total) by mouth 2 (two) times daily with a meal. Patient not taking: Reported on 10/12/2017 12/15/15   Troy Sine, MD  KOMBIGLYZE XR 2.11-998 MG TB24 Take 1 tablet by mouth 2 (two) times daily. 12/11/15   [provider]  ramipril (ALTACE) 10 MG capsule Take 1 capsule (10 mg total) by mouth daily. Patient not taking: Reported on 10/12/2017 09/16/15   Troy Sine, MD   Mr Foot Right W Wo Contrast  Result Date: 10/13/2017 CLINICAL DATA:  Chronic right great toe ulcer with new discoloration, swelling, and foul odor. Evaluate for osteomyelitis. EXAM: MRI OF THE RIGHT FOREFOOT WITHOUT AND WITH CONTRAST TECHNIQUE: Multiplanar, multisequence MR imaging of the right forefoot was performed before and after the administration of intravenous contrast. CONTRAST:  97mL MULTIHANCE GADOBENATE DIMEGLUMINE 529 MG/ML IV SOLN COMPARISON:  Right foot x-rays from yesterday. FINDINGS: Bones/Joint/Cartilage There is prominent edema and enhancement with corresponding T1 marrow signal intensity involving the first proximal and distal phalanges, consistent with osteomyelitis. No fracture or dislocation. Normal alignment. Small first IP joint and first MTP joint effusions. Ligaments Collateral ligaments are intact.  Lisfranc ligament is intact. Muscles and Tendons There is prominent fluid and enhancement within the extensor hallucis longus tendon sheath, consistent with tenosynovitis. This extends proximally to the level of the navicular. Increased T2 signal within the intrinsic muscles of the forefoot, nonspecific, but likely related to diabetic muscle changes. Soft tissue Soft tissue ulceration along the medial great toe. Large gas containing fluid collection about the great toe, measuring approximately 6.0 x 3.6 x 4.5 cm (AP by transverse by CC), extending along the dorsal forefoot to the mid first metatarsal. IMPRESSION: 1. Soft tissue  ulceration along the medial aspect of the great toe with extensive gas-forming infection of the great toe and osteomyelitis of the first proximal and distal phalanges. Large 6.0 cm abscess about the great toe extending proximally to the mid first metatarsal. 2. Infectious tenosynovitis of the extensor hallucis longus tendon. Electronically Signed   By: Titus Dubin M.D.   On: 10/13/2017 10:07   Dg Foot Complete Right  Result Date: 10/12/2017 CLINICAL DATA:  Right great toe wound. Redness, painful and swollen. EXAM: RIGHT FOOT COMPLETE - 3+ VIEW COMPARISON:  None. FINDINGS: No acute fracture or dislocation. Partial osteolysis of distal medial corner of the first proximal phalanx with a nondisplaced fracture. Soft tissue emphysema in the soft tissue surrounding the great toe extending into the remainder of the forefoot along the medial aspect with soft tissue swelling. No other  fracture or dislocation. No periosteal reaction. Normal alignment. IMPRESSION: 1. Soft tissue emphysema in the soft tissue surrounding the great toe extending into the remainder of the forefoot concerning for gas-forming infection. 2. Partial osteolysis of the distal medial corner of the first proximal phalanx with a nondisplaced fracture concerning for underlying osteomyelitis. Electronically Signed   By: Kathreen Devoid   On: 10/12/2017 14:04   - pertinent xrays, CT, MRI studies were reviewed and independently interpreted  Positive ROS: All other systems have been reviewed and were otherwise negative with the exception of those mentioned in the HPI and as above.  Physical Exam: General: Alert, no acute distress Psychiatric: Patient is competent for consent with normal mood and affect Lymphatic: No axillary or cervical lymphadenopathy Cardiovascular: No pedal edema Respiratory: No cyanosis, no use of accessory musculature GI: No organomegaly, abdomen is soft and non-tender  Skin: Examination of patient's foot he has dry  gangrenous changes of the entire great toe.  There is foul-smelling drainage.  Images:  @ENCIMAGES @   Neurologic: Patient does not have protective sensation bilateral lower extremities.   MUSCULOSKELETAL:  On examination patient has a strong dorsalis pedis and posterior tibial pulse he does have venous stasis swelling.  He has gangrenous changes of the entire great toe to the proximal of the MTP joint.  There is no ascending cellulitis.  The ulcer extends to the second toe.  There are no heel ulcers or other plantar ulcers.  Patient is hemoglobin A1c has ranged from 14.1 currently at 8.7.  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  Plan: Plan: We will plan for a first and second ray amputation of the right foot.  Discussed with the patient if the abscess and infection extends more proximally we would need to return to the operating room for further definitive surgery.  Risks and benefits were discussed including potential for additional amputation surgery.  Patient states she understands and wishes to proceed at this time plan for surgery Saturday morning at 730.  Thank you for the consult and the opportunity to see Brian Herring, [place] [phone] 3:59 PM

## 2017-10-13 NOTE — Consult Note (Signed)
[facility] Nurse wound consult note Reason for Consult: Right foot infection and wound Wound type: Infected area  MR results from 10/13/17 at 10:07 were: 1. Soft tissue ulceration along the medial aspect of the great toe with extensive gas-forming infection of the great toe and osteomyelitis of the first proximal and distal phalanges. Large 6.0 cm abscess about the great toe extending proximally to the mid first metatarsal. 2. Infectious tenosynovitis of the extensor hallucis longus tendon.  The patient's clinical condition and care needs exceed the scope of the [place].  Please refer care needs to attending for surgical consult. Thank you for the consult.  Discussed plan of care with the patient and bedside nurse.  [facility] nurse will not follow at this time.  Please re-consult the [facility] team if needed.  Val Riles, RN, MSN, CWOCN, CNS-BC, pager [phone]

## 2017-10-13 NOTE — Consult Note (Signed)
Reason for Consult:Toe ulcer Referring Physician: Norwin Aleman is an 62 y.o. male with DM, HTN, dyslipidemia, and CHF. HPI: Brian Herring has been struggling with a right great toe ulcer for about 6 months. He's been under the care of his PCP who has treated it a few times with rounds of oral abx. It hasn't really bothered him very much until this week when he began to have significant pain associated with it. It's bad enough that he struggles to walk. He denies any similar foot ulcers. He was working when this started but is on disability now for his heart issues.  Past Medical History:  Diagnosis Date  . Chronic systolic CHF (congestive heart failure) ([place])   . Diabetes mellitus without complication ([place])   . Hyperlipidemia   . Hypertension     Past Surgical History:  Procedure Laterality Date  . SHOULDER SURGERY  01/05    Family History  Problem Relation Age of Onset  . Pneumonia Mother        aspiration    Social History:  reports that he has never smoked. He has never used smokeless tobacco. He reports that he does not drink alcohol. His drug history is not on file.  Allergies:  Allergies  Allergen Reactions  . Sulfur Rash    Medications: I have reviewed the patient's current medications.  Results for orders placed or performed during the hospital encounter of 10/12/17 (from the past 48 hour(s))  Comprehensive metabolic panel     Status: Abnormal   Collection Time: 10/12/17  1:15 PM  Result Value Ref Range   Sodium 129 (L) 135 - 145 mmol/L   Potassium 4.4 3.5 - 5.1 mmol/L   Chloride 94 (L) 101 - 111 mmol/L   CO2 21 (L) 22 - 32 mmol/L   Glucose, Bld 266 (H) 65 - 99 mg/dL   BUN 29 (H) 6 - 20 mg/dL   Creatinine, Ser 1.17 0.61 - 1.24 mg/dL   Calcium 8.8 (L) 8.9 - 10.3 mg/dL   Total Protein 8.0 6.5 - 8.1 g/dL   Albumin 3.4 (L) 3.5 - 5.0 g/dL   AST 26 15 - 41 U/L   ALT 28 17 - 63 U/L   Alkaline Phosphatase 113 38 - 126 U/L   Total Bilirubin 1.2 0.3 - 1.2  mg/dL   GFR calc non Af Amer >60 >60 mL/min   GFR calc Af Amer >60 >60 mL/min    Comment: (NOTE) The eGFR has been calculated using the CKD EPI equation. This calculation has not been validated in all clinical situations. eGFR's persistently <60 mL/min signify possible Chronic Kidney Disease.    Anion gap 14 5 - 15    Comment: Performed at [facility], [address] [place], [place] [address]  CBC with Differential     Status: Abnormal   Collection Time: 10/12/17  1:15 PM  Result Value Ref Range   WBC 23.5 (H) 4.0 - 10.5 K/uL   RBC 4.42 4.22 - 5.81 MIL/uL   Hemoglobin 13.2 13.0 - 17.0 g/dL   HCT 39.7 39.0 - 52.0 %   MCV 89.8 78.0 - 100.0 fL   MCH 29.9 26.0 - 34.0 pg   MCHC 33.2 30.0 - 36.0 g/dL   RDW 14.6 11.5 - 15.5 %   Platelets 351 150 - 400 K/uL   Neutrophils Relative % 88 %   Lymphocytes Relative 6 %   Monocytes Relative 6 %   Eosinophils Relative 0 %  Basophils Relative 0 %   Neutro Abs 20.7 (H) 1.7 - 7.7 K/uL   Lymphs Abs 1.4 0.7 - 4.0 K/uL   Monocytes Absolute 1.4 (H) 0.1 - 1.0 K/uL   Eosinophils Absolute 0.0 0.0 - 0.7 K/uL   Basophils Absolute 0.0 0.0 - 0.1 K/uL   Smear Review MORPHOLOGY UNREMARKABLE     Comment: Performed at [facility], [address] [place], [place] [address]  I-Stat CG4 Lactic Acid, ED     Status: Abnormal   Collection Time: 10/12/17  1:19 PM  Result Value Ref Range   Lactic Acid, Venous 2.43 (HH) 0.5 - 1.9 mmol/L   Comment NOTIFIED PHYSICIAN   Urinalysis, Routine w reflex microscopic     Status: Abnormal   Collection Time: 10/12/17  1:48 PM  Result Value Ref Range   Color, Urine YELLOW YELLOW   APPearance CLEAR CLEAR   Specific Gravity, Urine <1.005 (L) 1.005 - 1.030   pH 5.5 5.0 - 8.0   Glucose, UA >=500 (A) NEGATIVE mg/dL   Hgb urine dipstick NEGATIVE NEGATIVE   Bilirubin Urine NEGATIVE NEGATIVE   Ketones, ur 15 (A) NEGATIVE mg/dL   Protein, ur NEGATIVE NEGATIVE mg/dL   Nitrite NEGATIVE NEGATIVE    Leukocytes, UA NEGATIVE NEGATIVE    Comment: Performed at [facility], [address] [place], [place] [address]  Urinalysis, Microscopic (reflex)     Status: Abnormal   Collection Time: 10/12/17  1:48 PM  Result Value Ref Range   RBC / HPF NONE SEEN 0 - 5 RBC/hpf   WBC, UA 0-5 0 - 5 WBC/hpf   Bacteria, UA RARE (A) NONE SEEN   Squamous Epithelial / LPF 0-5 (A) NONE SEEN    Comment: Performed at [facility], [address] [place], [place] [address]  I-Stat CG4 Lactic Acid, ED     Status: None   Collection Time: 10/12/17  3:15 PM  Result Value Ref Range   Lactic Acid, Venous 1.27 0.5 - 1.9 mmol/L  CBG monitoring, ED     Status: Abnormal   Collection Time: 10/12/17  5:20 PM  Result Value Ref Range   Glucose-Capillary 128 (H) 65 - 99 mg/dL  Osmolality, urine     Status: None   Collection Time: 10/12/17  8:38 PM  Result Value Ref Range   Osmolality, Ur 668 300 - 900 mOsm/kg    Comment: Performed at [facility], [place] [address], [place], [place] [address]  Sodium, urine, random     Status: None   Collection Time: 10/12/17  8:38 PM  Result Value Ref Range   Sodium, Ur 11 mmol/L    Comment: Performed at [facility] [address] [place], [place] [address]  Hemoglobin A1c     Status: Abnormal   Collection Time: 10/12/17  9:07 PM  Result Value Ref Range   Hgb A1c MFr Bld 8.7 (H) 4.8 - 5.6 %    Comment: (NOTE) Pre diabetes:          5.7%-6.4% Diabetes:              >6.4% Glycemic control for   <7.0% adults with diabetes    Mean Plasma Glucose 202.99 mg/dL    Comment: Performed at [facility] [address], [place], [place] [address]  Sedimentation rate     Status: Abnormal   Collection Time: 10/12/17  9:07 PM  Result Value Ref Range  Sed Rate 125 (H) 0 - 16 mm/hr    Comment: Performed at [facility] [address], [place], [place] [address]  C-reactive protein     Status: Abnormal   Collection Time: 10/12/17  9:07 PM   Result Value Ref Range   CRP 32.4 (H) <1.0 mg/dL    Comment: Performed at [facility] [address], [place], [place] [address]  Prealbumin     Status: Abnormal   Collection Time: 10/12/17  9:07 PM  Result Value Ref Range   Prealbumin <5 (L) 18 - 38 mg/dL    Comment: Performed at [facility] [address], [place], [place] [address]  Basic metabolic panel     Status: Abnormal   Collection Time: 10/12/17  9:07 PM  Result Value Ref Range   Sodium 132 (L) 135 - 145 mmol/L   Potassium 4.2 3.5 - 5.1 mmol/L   Chloride 99 (L) 101 - 111 mmol/L   CO2 21 (L) 22 - 32 mmol/L   Glucose, Bld 105 (H) 65 - 99 mg/dL   BUN 23 (H) 6 - 20 mg/dL   Creatinine, Ser 1.04 0.61 - 1.24 mg/dL   Calcium 8.3 (L) 8.9 - 10.3 mg/dL   GFR calc non Af Amer >60 >60 mL/min   GFR calc Af Amer >60 >60 mL/min    Comment: (NOTE) The eGFR has been calculated using the CKD EPI equation. This calculation has not been validated in all clinical situations. eGFR's persistently <60 mL/min signify possible Chronic Kidney Disease.    Anion gap 12 5 - 15    Comment: Performed at [facility] [address], [place], [place] [address]  Osmolality     Status: None   Collection Time: 10/12/17  9:07 PM  Result Value Ref Range   Osmolality 280 275 - 295 mOsm/kg    Comment: Performed at [facility] [address] [place], [place] [address]  Brain natriuretic peptide     Status: Abnormal   Collection Time: 10/12/17  9:07 PM  Result Value Ref Range   B Natriuretic Peptide 109.7 (H) 0.0 - 100.0 pg/mL    Comment: Performed at [facility] [address], [place], [place] [address]  Lactic acid, plasma     Status: None   Collection Time: 10/12/17  9:07 PM  Result Value Ref Range   Lactic Acid, Venous 0.9 0.5 - 1.9 mmol/L    Comment: Performed at [facility] [address], [place], [place] [address]  Procalcitonin     Status: None   Collection Time: 10/12/17  9:07 PM  Result Value  Ref Range   Procalcitonin 0.35 ng/mL    Comment:        Interpretation: PCT (Procalcitonin) <= 0.5 ng/mL: Systemic infection (sepsis) is not likely. Local bacterial infection is possible. (NOTE)       Sepsis PCT Algorithm           Lower Respiratory Tract                                      Infection PCT Algorithm    ----------------------------     ----------------------------         PCT < 0.25 ng/mL                PCT < 0.10 ng/mL  Strongly encourage             Strongly discourage   discontinuation of antibiotics    initiation of antibiotics    ----------------------------     -----------------------------       PCT 0.25 - 0.50 ng/mL            PCT 0.10 - 0.25 ng/mL               OR       >80% decrease in PCT            Discourage initiation of                                            antibiotics      Encourage discontinuation           of antibiotics    ----------------------------     -----------------------------         PCT >= 0.50 ng/mL              PCT 0.26 - 0.50 ng/mL               AND        <80% decrease in PCT             Encourage initiation of                                             antibiotics       Encourage continuation           of antibiotics    ----------------------------     -----------------------------        PCT >= 0.50 ng/mL                  PCT > 0.50 ng/mL               AND         increase in PCT                  Strongly encourage                                      initiation of antibiotics    Strongly encourage escalation           of antibiotics                                     -----------------------------                                           PCT <= 0.25 ng/mL                                                 OR                                        >  80% decrease in PCT                                     Discontinue / Do not initiate                                             antibiotics Performed at [facility], [place] [address], [place], [place] [address]   Protime-INR     Status: Abnormal   Collection Time: 10/12/17  9:07 PM  Result Value Ref Range   Prothrombin Time 15.9 (H) 11.4 - 15.2 seconds   INR 1.28     Comment: Performed at [facility] [address], [place], [place] [address]  APTT     Status: Abnormal   Collection Time: 10/12/17  9:07 PM  Result Value Ref Range   aPTT 41 (H) 24 - 36 seconds    Comment:        IF BASELINE aPTT IS ELEVATED, SUGGEST PATIENT RISK ASSESSMENT BE USED TO DETERMINE APPROPRIATE ANTICOAGULANT THERAPY. Performed at [facility], [place] [address] [place], [place] [address]   Glucose, capillary     Status: Abnormal   Collection Time: 10/12/17 10:09 PM  Result Value Ref Range   Glucose-Capillary 101 (H) 65 - 99 mg/dL  Glucose, capillary     Status: Abnormal   Collection Time: 10/13/17 12:26 AM  Result Value Ref Range   Glucose-Capillary 170 (H) 65 - 99 mg/dL  TSH     Status: None   Collection Time: 10/13/17 12:41 AM  Result Value Ref Range   TSH 1.297 0.350 - 4.500 uIU/mL    Comment: Performed by a 3rd Generation assay with a functional sensitivity of <=0.01 uIU/mL. Performed at [facility], [place] [address], [place], [place] [address]   Lactic acid, plasma     Status: None   Collection Time: 10/13/17 12:41 AM  Result Value Ref Range   Lactic Acid, Venous 1.4 0.5 - 1.9 mmol/L    Comment: Performed at [facility] [address] [place], [place] [address]  Basic metabolic panel     Status: Abnormal   Collection Time: 10/13/17 12:41 AM  Result Value Ref Range   Sodium 130 (L) 135 - 145 mmol/L   Potassium 4.7 3.5 - 5.1 mmol/L   Chloride 97 (L) 101 - 111 mmol/L   CO2 22 22 - 32 mmol/L   Glucose, Bld 178 (H) 65 - 99 mg/dL   BUN 25 (H) 6 - 20 mg/dL   Creatinine, Ser 1.11 0.61 - 1.24 mg/dL   Calcium 8.0 (L) 8.9 - 10.3 mg/dL   GFR calc non Af Amer >60 >60 mL/min   GFR calc Af Amer >60 >60 mL/min    Comment:  (NOTE) The eGFR has been calculated using the CKD EPI equation. This calculation has not been validated in all clinical situations. eGFR's persistently <60 mL/min signify possible Chronic Kidney Disease.    Anion gap 11 5 - 15    Comment: Performed at [facility] [address] [place], [place] [address]  CBC     Status: Abnormal   Collection Time: 10/13/17 12:41 AM  Result Value Ref Range   WBC 22.1 (H) 4.0 - 10.5 K/uL  RBC 3.88 (L) 4.22 - 5.81 MIL/uL   Hemoglobin 11.3 (L) 13.0 - 17.0 g/dL   HCT 35.4 (L) 39.0 - 52.0 %   MCV 91.2 78.0 - 100.0 fL   MCH 29.1 26.0 - 34.0 pg   MCHC 31.9 30.0 - 36.0 g/dL   RDW 14.7 11.5 - 15.5 %   Platelets 333 150 - 400 K/uL    Comment: Performed at [facility] [address] [place], [place] [address]  Glucose, capillary     Status: Abnormal   Collection Time: 10/13/17  9:26 AM  Result Value Ref Range   Glucose-Capillary 110 (H) 65 - 99 mg/dL    Mr Foot Right W Wo Contrast  Result Date: 10/13/2017 CLINICAL DATA:  Chronic right great toe ulcer with new discoloration, swelling, and foul odor. Evaluate for osteomyelitis. EXAM: MRI OF THE RIGHT FOREFOOT WITHOUT AND WITH CONTRAST TECHNIQUE: Multiplanar, multisequence MR imaging of the right forefoot was performed before and after the administration of intravenous contrast. CONTRAST:  64m MULTIHANCE GADOBENATE DIMEGLUMINE 529 MG/ML IV SOLN COMPARISON:  Right foot x-rays from yesterday. FINDINGS: Bones/Joint/Cartilage There is prominent edema and enhancement with corresponding T1 marrow signal intensity involving the first proximal and distal phalanges, consistent with osteomyelitis. No fracture or dislocation. Normal alignment. Small first IP joint and first MTP joint effusions. Ligaments Collateral ligaments are intact.  Lisfranc ligament is intact. Muscles and Tendons There is prominent fluid and enhancement within the extensor hallucis longus tendon sheath, consistent with tenosynovitis.  This extends proximally to the level of the navicular. Increased T2 signal within the intrinsic muscles of the forefoot, nonspecific, but likely related to diabetic muscle changes. Soft tissue Soft tissue ulceration along the medial great toe. Large gas containing fluid collection about the great toe, measuring approximately 6.0 x 3.6 x 4.5 cm (AP by transverse by CC), extending along the dorsal forefoot to the mid first metatarsal. IMPRESSION: 1. Soft tissue ulceration along the medial aspect of the great toe with extensive gas-forming infection of the great toe and osteomyelitis of the first proximal and distal phalanges. Large 6.0 cm abscess about the great toe extending proximally to the mid first metatarsal. 2. Infectious tenosynovitis of the extensor hallucis longus tendon. Electronically Signed   By: WTitus DubinM.D.   On: 10/13/2017 10:07   Dg Foot Complete Right  Result Date: 10/12/2017 CLINICAL DATA:  Right great toe wound. Redness, painful and swollen. EXAM: RIGHT FOOT COMPLETE - 3+ VIEW COMPARISON:  None. FINDINGS: No acute fracture or dislocation. Partial osteolysis of distal medial corner of the first proximal phalanx with a nondisplaced fracture. Soft tissue emphysema in the soft tissue surrounding the great toe extending into the remainder of the forefoot along the medial aspect with soft tissue swelling. No other fracture or dislocation. No periosteal reaction. Normal alignment. IMPRESSION: 1. Soft tissue emphysema in the soft tissue surrounding the great toe extending into the remainder of the forefoot concerning for gas-forming infection. 2. Partial osteolysis of the distal medial corner of the first proximal phalanx with a nondisplaced fracture concerning for underlying osteomyelitis. Electronically Signed   By: HKathreen Devoid  On: 10/12/2017 14:04    Review of Systems  Constitutional: Negative for weight loss.  HENT: Negative for ear discharge, ear pain, hearing loss and tinnitus.    Eyes: Negative for blurred vision, double vision, photophobia and pain.  Respiratory: Negative for cough, sputum production and shortness of breath.   Cardiovascular: Negative for chest pain.  Gastrointestinal: Negative  for abdominal pain, nausea and vomiting.  Genitourinary: Negative for dysuria, flank pain, frequency and urgency.  Musculoskeletal: Positive for joint pain (Right great toe). Negative for back pain, falls, myalgias and neck pain.  Neurological: Negative for dizziness, tingling, sensory change, focal weakness, loss of consciousness and headaches.  Endo/Heme/Allergies: Does not bruise/bleed easily.  Psychiatric/Behavioral: Negative for depression, memory loss and substance abuse. The patient is not nervous/anxious.    Blood pressure (!) 99/53, pulse 93, temperature 98.7 F (37.1 C), temperature source Oral, resp. rate (!) 21, height 6' (1.829 m), weight 108 kg (238 lb 1.6 oz), SpO2 94 %. Physical Exam  Constitutional: He appears well-developed and well-nourished. No distress.  HENT:  Head: Normocephalic and atraumatic.  Eyes: Conjunctivae are normal. Right eye exhibits no discharge. Left eye exhibits no discharge. No scleral icterus.  Neck: Normal range of motion.  Cardiovascular: Normal rate and regular rhythm.  Respiratory: Effort normal. No respiratory distress.  Musculoskeletal:  LLE No traumatic wounds, ecchymosis, or rash  Great toe with medial and plantar ulceration, foul odor, mod TTP, cyanotic  No knee or ankle effusion  Knee stable to varus/ valgus and anterior/posterior stress  Sens DPN, SPN, TN paresthetic  Motor EHL, ext, flex, evers 5/5  DP 1+, PT 0, 2+ pitting edema  Neurological: He is alert.  Skin: Skin is warm and dry. He is not diaphoretic.  Psychiatric: He has a normal mood and affect. His behavior is normal.    Assessment/Plan: Right great toe ulcer with underlying osteomyelitis -- Needs amputation. Dr. Sharol Given to evaluate and will likely perform  surgery in AM. Will give diet now, NPO after MN. Will get ABI's in meantime. Multiple medical problems -- per primary service    Lisette Abu, PA-C Orthopedic Surgery [phone] 10/13/2017, 12:36 PM

## 2017-10-13 NOTE — Progress Notes (Signed)
PROGRESS NOTE    Brian Herring  PRF:[id number] DOB: Jan 14, 1956 DOA: 10/12/2017 PCP: [name], MD   Brief Narrative:  HPI on 10/12/2017 by Dr. [name] Brian Herring is a 62 y.o. male with medical history significant of hypertension, hyperlipidemia, diabetes mellitus, sCHF with EF 25%, who presents with right foot ulcer with infection.  Pt states that he has right great toe ulcer which has been going on for more than 6 months. About one week ago, he noticed purple-black color change in his right great toe. His right foot becomes swelling, erythematous with foul odors coming from his left great toe. He has not tried anything at home. He also has into meeting to fever and chills. He has mild tenderness in the right foot. Patient denies chest pain, shortness breath, cough. He has nausea, no vomiting, diarrhea or abdominal pain. Denies symptoms of UTI or unilateral weakness.  Interim history Admitted for R foot infection and cellulitis. MRI showed osteo, ortho consulted.  Assessment & Plan   Secondary to right great toe osteomyelitis and cellulitis -Patient presented with leukocytosis, tachycardia, tachypnea, fever -X-ray findings are concerning for gas-forming infection and osteomyelitis -Placed on vancomycin, Flagyl, Rocephin -Continue pain control -ESR 125, CRP 32.4 -MRI right foot showed soft tissue ulceration along the medial aspect of the great toe with extensive gas forming infection of the great toe and osteomyelitis of the first proximal and distal phalanges.  Large 6 cm abscess about the great toe extending proximally to the mid first metatarsal.  Infectious tenosynovitis of the extensor hallux longus tendon -Orthopedic surgery consulted and appreciated -ABI pending   Diabetes mellitus, type II, poorly controlled -Continue insulin sliding scale and CBG monitoring -Home medications held -Hemoglobin A1c 8.7  Chronic systolic heart failure -Echocardiogram 09/02/2015:  EF of 25-30% -Patient currently appears to be compensated and euvolemic -Lasix and spironolactone held due to sepsis, likely restart medications within 24 hours ending course -Continue Entresto, metoprolol  Hyponatremia, chronic -Suspect secondary to diuretics and Entresto -After reviewing patient's chart, appears to be a chronic issue -Sodium currently 130 -Continue to monitor BMP  Essential hypertension -Continue metoprolol, Entresto  Hyperlipidemia -Continue statin  DVT Prophylaxis  heparin  Code Status: Full  Family Communication: none at bedside  Disposition Plan: Admitted. Pending orthopedics consult and further recommendations.  Consultants Orthopedic surgery  Procedures  None  Antibiotics   Anti-infectives (From admission, onward)   Start     Dose/Rate Route Frequency Ordered Stop   10/13/17 0300  vancomycin (VANCOCIN) IVPB 750 mg/150 ml premix     750 mg 150 mL/hr over 60 Minutes Intravenous Every 12 hours 10/12/17 1435     10/12/17 2130  cefTRIAXone (ROCEPHIN) 2 g in sodium chloride 0.9 % 100 mL IVPB     2 g 200 mL/hr over 30 Minutes Intravenous Every 24 hours 10/12/17 2036     10/12/17 2045  metroNIDAZOLE (FLAGYL) IVPB 500 mg     500 mg 100 mL/hr over 60 Minutes Intravenous Every 8 hours 10/12/17 2036     10/12/17 2000  piperacillin-tazobactam (ZOSYN) IVPB 3.375 g  Status:  Discontinued     3.375 g 12.5 mL/hr over 240 Minutes Intravenous Every 8 hours 10/12/17 1435 10/12/17 2036   10/12/17 1545  vancomycin (VANCOCIN) IVPB 1000 mg/200 mL premix  Status:  Discontinued     1,000 mg 200 mL/hr over 60 Minutes Intravenous  Once 10/12/17 1442 10/12/17 2103   10/12/17 1445  vancomycin (VANCOCIN) IVPB 1000 mg/200 mL premix  1,000 mg 200 mL/hr over 60 Minutes Intravenous  Once 10/12/17 1442 10/12/17 1545   10/12/17 1430  vancomycin (VANCOCIN) 2,000 mg in sodium chloride 0.9 % 500 mL IVPB  Status:  Discontinued     2,000 mg 250 mL/hr over 120 Minutes  Intravenous  Once 10/12/17 1425 10/12/17 1442   10/12/17 1400  vancomycin (VANCOCIN) IVPB 1000 mg/200 mL premix  Status:  Discontinued     1,000 mg 200 mL/hr over 60 Minutes Intravenous  Once 10/12/17 1348 10/12/17 1425   10/12/17 1345  piperacillin-tazobactam (ZOSYN) IVPB 3.375 g     3.375 g 100 mL/hr over 30 Minutes Intravenous  Once 10/12/17 1339 10/12/17 1441      Subjective:   Brian Herring seen and examined today.    Patient with no complaints today.  Would like to have surgery sooner rather than later so he can go home.  Denies current chest pain, shortness breath, abdominal pain, nausea or vomiting, diarrhea or constipation.  Objective:   Vitals:   10/12/17 2141 10/13/17 0500 10/13/17 0550 10/13/17 0934  BP: (!) 107/54  (!) 97/59 (!) 99/53  Pulse: 98  95 93  Resp:   (!) 21   Temp:   98.7 F (37.1 C) 98.7 F (37.1 C)  TempSrc:   Oral Oral  SpO2:   96% 94%  Weight:  108 kg (238 lb 1.6 oz)    Height:        Intake/Output Summary (Last 24 hours) at 10/13/2017 1252 Last data filed at 10/13/2017 7989 Gross per 24 hour  Intake 1462 ml  Output 625 ml  Net 837 ml   Filed Weights   10/12/17 1219 10/13/17 0500  Weight: 108.9 kg (240 lb) 108 kg (238 lb 1.6 oz)    Exam  General: Well developed, well nourished, NAD, appears stated age  HEENT: NCAT, mucous membranes moist.   Neck: Supple  Cardiovascular: S1 S2 auscultated, no rubs, murmurs or gallops. Regular rate and rhythm.  Respiratory: Clear to auscultation bilaterally with equal chest rise  Abdomen: Soft, nontender, nondistended, + bowel sounds  Extremities: warm dry without cyanosis clubbing edema of LLE. RLE with mild cellulitis extending from foot to mid anterior shin, R great toe with ucleration and edema, cyanotic   Neuro: AAOx3, nonfocal  Psych: Normal affect and demeanor with intact judgement and insight   Data Reviewed: I have personally reviewed following labs and imaging studies  CBC: Recent  Labs  Lab 10/12/17 1315 10/13/17 0041  WBC 23.5* 22.1*  NEUTROABS 20.7*  --   HGB 13.2 11.3*  HCT 39.7 35.4*  MCV 89.8 91.2  PLT 351 211   Basic Metabolic Panel: Recent Labs  Lab 10/12/17 1315 10/12/17 2107 10/13/17 0041  NA 129* 132* 130*  K 4.4 4.2 4.7  CL 94* 99* 97*  CO2 21* 21* 22  GLUCOSE 266* 105* 178*  BUN 29* 23* 25*  CREATININE 1.17 1.04 1.11  CALCIUM 8.8* 8.3* 8.0*   GFR: Estimated Creatinine Clearance: 88.8 mL/min (by C-G formula based on SCr of 1.11 mg/dL). Liver Function Tests: Recent Labs  Lab 10/12/17 1315  AST 26  ALT 28  ALKPHOS 113  BILITOT 1.2  PROT 8.0  ALBUMIN 3.4*   No results for input(s): LIPASE, AMYLASE in the last 168 hours. No results for input(s): AMMONIA in the last 168 hours. Coagulation Profile: Recent Labs  Lab 10/12/17 2107  INR 1.28   Cardiac Enzymes: No results for input(s): CKTOTAL, CKMB, CKMBINDEX, TROPONINI in the last  168 hours. BNP (last 3 results) No results for input(s): PROBNP in the last 8760 hours. HbA1C: Recent Labs    10/12/17 2107  HGBA1C 8.7*   CBG: Recent Labs  Lab 10/12/17 1720 10/12/17 2209 10/13/17 0026 10/13/17 0926  GLUCAP 128* 101* 170* 110*   Lipid Profile: No results for input(s): CHOL, HDL, LDLCALC, TRIG, CHOLHDL, LDLDIRECT in the last 72 hours. Thyroid Function Tests: Recent Labs    10/13/17 0041  TSH 1.297   Anemia Panel: No results for input(s): VITAMINB12, FOLATE, FERRITIN, TIBC, IRON, RETICCTPCT in the last 72 hours. Urine analysis:    Component Value Date/Time   COLORURINE YELLOW 10/12/2017 [place] 10/12/2017 1348   LABSPEC <1.005 (L) 10/12/2017 1348   PHURINE 5.5 10/12/2017 1348   GLUCOSEU >=500 (A) 10/12/2017 1348   HGBUR NEGATIVE 10/12/2017 1348   BILIRUBINUR NEGATIVE 10/12/2017 1348   KETONESUR 15 (A) 10/12/2017 1348   PROTEINUR NEGATIVE 10/12/2017 1348   NITRITE NEGATIVE 10/12/2017 1348   LEUKOCYTESUR NEGATIVE 10/12/2017 1348   Sepsis  Labs: _0 (procalcitonin:4,lacticidven:4)  )No results found for this or any previous visit (from the past 240 hour(s)).    Radiology Studies: Mr Foot Right W Wo Contrast  Result Date: 10/13/2017 CLINICAL DATA:  Chronic right great toe ulcer with new discoloration, swelling, and foul odor. Evaluate for osteomyelitis. EXAM: MRI OF THE RIGHT FOREFOOT WITHOUT AND WITH CONTRAST TECHNIQUE: Multiplanar, multisequence MR imaging of the right forefoot was performed before and after the administration of intravenous contrast. CONTRAST:  64m MULTIHANCE GADOBENATE DIMEGLUMINE 529 MG/ML IV SOLN COMPARISON:  Right foot x-rays from yesterday. FINDINGS: Bones/Joint/Cartilage There is prominent edema and enhancement with corresponding T1 marrow signal intensity involving the first proximal and distal phalanges, consistent with osteomyelitis. No fracture or dislocation. Normal alignment. Small first IP joint and first MTP joint effusions. Ligaments Collateral ligaments are intact.  Lisfranc ligament is intact. Muscles and Tendons There is prominent fluid and enhancement within the extensor hallucis longus tendon sheath, consistent with tenosynovitis. This extends proximally to the level of the navicular. Increased T2 signal within the intrinsic muscles of the forefoot, nonspecific, but likely related to diabetic muscle changes. Soft tissue Soft tissue ulceration along the medial great toe. Large gas containing fluid collection about the great toe, measuring approximately 6.0 x 3.6 x 4.5 cm (AP by transverse by CC), extending along the dorsal forefoot to the mid first metatarsal. IMPRESSION: 1. Soft tissue ulceration along the medial aspect of the great toe with extensive gas-forming infection of the great toe and osteomyelitis of the first proximal and distal phalanges. Large 6.0 cm abscess about the great toe extending proximally to the mid first metatarsal. 2. Infectious tenosynovitis of the extensor hallucis  longus tendon. Electronically Signed   By: WTitus DubinM.D.   On: 10/13/2017 10:07   Dg Foot Complete Right  Result Date: 10/12/2017 CLINICAL DATA:  Right great toe wound. Redness, painful and swollen. EXAM: RIGHT FOOT COMPLETE - 3+ VIEW COMPARISON:  None. FINDINGS: No acute fracture or dislocation. Partial osteolysis of distal medial corner of the first proximal phalanx with a nondisplaced fracture. Soft tissue emphysema in the soft tissue surrounding the great toe extending into the remainder of the forefoot along the medial aspect with soft tissue swelling. No other fracture or dislocation. No periosteal reaction. Normal alignment. IMPRESSION: 1. Soft tissue emphysema in the soft tissue surrounding the great toe extending into the remainder of the forefoot concerning for gas-forming infection. 2. Partial osteolysis of the distal  medial corner of the first proximal phalanx with a nondisplaced fracture concerning for underlying osteomyelitis. Electronically Signed   By: Kathreen Devoid   On: 10/12/2017 14:04     Scheduled Meds: . aspirin  81 mg Oral [id number]  . atorvastatin  80 mg Oral Daily  . heparin  5,000 Units Subcutaneous Q8H  . insulin aspart  0-5 Units Subcutaneous QHS  . insulin aspart  0-9 Units Subcutaneous TID WC  . metoprolol succinate  25 mg Oral QPM  . sacubitril-valsartan  1 tablet Oral BID   Continuous Infusions: . cefTRIAXone (ROCEPHIN)  IV Stopped (10/12/17 2211)  . metronidazole Stopped (10/13/17 0447)  . vancomycin Stopped (10/13/17 0445)     LOS: 1 day   Time Spent in minutes   30 minutes  Chen Holzman D.O. on 10/13/2017 at 12:52 PM  Between 7am to 7pm - Pager - [phone]  After 7pm go to [email or web address] - password [email or web address]  And look for the night coverage person covering for me after hours  [facility] Office  [phone]

## 2017-10-13 NOTE — Progress Notes (Signed)
Initial Nutrition Assessment  DOCUMENTATION CODES:   Obesity unspecified  INTERVENTION:    Prostat liquid protein po 30 ml BID with meals, each supplement provides 100 kcal, 15 grams protein  NUTRITION DIAGNOSIS:   Increased nutrient needs related to wound healing as evidenced by estimated needs  GOAL:   Patient will meet greater than or equal to 90% of their needs  MONITOR:   PO intake, Supplement acceptance, Labs, Skin, Weight trends  REASON FOR ASSESSMENT:   Consult Wound healing  ASSESSMENT:   62 y.o. Male with PMH significant of hypertension, hyperlipidemia, diabetes mellitus, sCHF with EF 25%, who presents with right foot ulcer with infection.  RD spoke with pt at bedside. Reports a good appetite. Eating well PTA. Reveals he was intentionally trying to lose weight with DM dx.  He is very amenable to trying protein supplements. Labs and medications reviewed. Na 130 (L). CBG's 170-110-90.  NUTRITION - FOCUSED PHYSICAL EXAM:  Completed. No muscle or fat depletion noticed.  Diet Order:  Diet Carb Modified Fluid consistency: Thin; Room service appropriate? Yes  EDUCATION NEEDS:   Not appropriate for education at this time  Skin:  Skin Assessment: Skin Integrity Issues: Skin Integrity Issues:: Diabetic Ulcer Diabetic Ulcer: toe  Last BM:  3/21  Height:   Ht Readings from Last 1 Encounters:  10/12/17 6' (1.829 m)    Weight:   Wt Readings from Last 1 Encounters:  10/13/17 238 lb 1.6 oz (108 kg)    Ideal Body Weight:  81 kg  BMI:  Body mass index is 32.29 kg/m.  Estimated Nutritional Needs:   Kcal:  2100-2300  Protein:  105-120 gm  Fluid:  2.1-2.3 L  Rafay Holms, RD, LDN Pager #: [phone] After-Hours Pager #: [phone]

## 2017-10-13 NOTE — H&P (View-Only) (Signed)
ORTHOPAEDIC CONSULTATION  REQUESTING PHYSICIAN: Cristal Ford, DO  Chief Complaint: Chronic gangrenous ulcer right great toe.  HPI: Brian Herring is a 62 y.o. male who presents with chronic gangrenous ulcer right great toe.  Patient states that he has had this for several months.  Past Medical History:  Diagnosis Date  . Chronic systolic CHF (congestive heart failure) ([place])   . Diabetes mellitus without complication ([place])   . Hyperlipidemia   . Hypertension    Past Surgical History:  Procedure Laterality Date  . SHOULDER SURGERY  01/05   Social History   Socioeconomic History  . Marital status: Single    Spouse name: Not on file  . Number of children: Not on file  . Years of education: Not on file  . Highest education level: Not on file  Occupational History  . Not on file  Social Needs  . Financial resource strain: Not on file  . Food insecurity:    Worry: Not on file    Inability: Not on file  . Transportation needs:    Medical: Not on file    Non-medical: Not on file  Tobacco Use  . Smoking status: Never Smoker  . Smokeless tobacco: Never Used  Substance and Sexual Activity  . Alcohol use: No    Alcohol/week: 0.0 oz  . Drug use: Not on file  . Sexual activity: Not on file  Lifestyle  . Physical activity:    Days per week: Not on file    Minutes per session: Not on file  . Stress: Not on file  Relationships  . Social connections:    Talks on phone: Not on file    Gets together: Not on file    Attends religious service: Not on file    Active member of club or organization: Not on file    Attends meetings of clubs or organizations: Not on file    Relationship status: Not on file  Other Topics Concern  . Not on file  Social History Narrative  . Not on file   Family History  Problem Relation Age of Onset  . Pneumonia Mother        aspiration   - negative except otherwise stated in the family history section Allergies  Allergen Reactions    . Sulfur Rash   Prior to Admission medications   Medication Sig Start Date End Date Taking? Authorizing Provider  acarbose (PRECOSE) 25 MG tablet Take 1 tablet by mouth 3 (three) times daily with meals.  06/24/13  Yes [provider]  aspirin 81 MG tablet Take 81 mg by mouth every morning.    Yes [provider]  Empagliflozin (JARDIANCE PO) Take 25 mg by mouth every morning.    Yes [provider]  furosemide (LASIX) 40 MG tablet Take 40 mg by mouth daily as needed.    Yes [provider]  glimepiride (AMARYL) 4 MG tablet Take 1 tablet by mouth every morning.  07/02/14  Yes [provider]  ibuprofen (ADVIL,MOTRIN) 200 MG tablet Take 200 mg by mouth every 6 (six) hours as needed for fever, headache, mild pain or moderate pain.   Yes [provider]  metFORMIN (GLUCOPHAGE) 500 MG tablet Take 1,000 mg by mouth 2 (two) times daily with a meal.    Yes [provider]  metoprolol succinate (TOPROL-XL) 50 MG 24 hr tablet Take 1 tablet (50 mg total) by mouth daily. Take with or immediately following a meal. Patient taking differently:  Take 25 mg by mouth every evening. Take with or immediately following a meal. 12/15/15  Yes Troy Sine, MD  rosuvastatin (CRESTOR) 10 MG tablet Take 10 mg by mouth every evening.    Yes [provider]  sacubitril-valsartan (ENTRESTO) 97-103 MG Take 1 tablet by mouth 2 (two) times daily.   Yes [provider]  spironolactone (ALDACTONE) 25 MG tablet Take 1 tablet (25 mg total) by mouth 2 (two) times daily. Patient taking differently: Take 25 mg by mouth every evening.  09/16/15  Yes Troy Sine, MD  amLODipine (NORVASC) 5 MG tablet Take 1 tablet (5 mg total) by mouth daily. Patient not taking: Reported on 10/12/2017 09/16/15   Troy Sine, MD  atorvastatin (LIPITOR) 80 MG tablet Take 1 tablet (80 mg total) by mouth daily. Patient not taking: Reported on 10/12/2017 09/16/15   Troy Sine, MD  ivabradine ([name]) 7.5 MG TABS tablet Take 1 tablet (7.5 mg total) by mouth 2 (two) times daily with a meal. Patient not taking: Reported on 10/12/2017 12/15/15   Troy Sine, MD  KOMBIGLYZE XR 2.11-998 MG TB24 Take 1 tablet by mouth 2 (two) times daily. 12/11/15   [provider]  ramipril (ALTACE) 10 MG capsule Take 1 capsule (10 mg total) by mouth daily. Patient not taking: Reported on 10/12/2017 09/16/15   Troy Sine, MD   Mr Foot Right W Wo Contrast  Result Date: 10/13/2017 CLINICAL DATA:  Chronic right great toe ulcer with new discoloration, swelling, and foul odor. Evaluate for osteomyelitis. EXAM: MRI OF THE RIGHT FOREFOOT WITHOUT AND WITH CONTRAST TECHNIQUE: Multiplanar, multisequence MR imaging of the right forefoot was performed before and after the administration of intravenous contrast. CONTRAST:  46mL MULTIHANCE GADOBENATE DIMEGLUMINE 529 MG/ML IV SOLN COMPARISON:  Right foot x-rays from yesterday. FINDINGS: Bones/Joint/Cartilage There is prominent edema and enhancement with corresponding T1 marrow signal intensity involving the first proximal and distal phalanges, consistent with osteomyelitis. No fracture or dislocation. Normal alignment. Small first IP joint and first MTP joint effusions. Ligaments Collateral ligaments are intact.  Lisfranc ligament is intact. Muscles and Tendons There is prominent fluid and enhancement within the extensor hallucis longus tendon sheath, consistent with tenosynovitis. This extends proximally to the level of the navicular. Increased T2 signal within the intrinsic muscles of the forefoot, nonspecific, but likely related to diabetic muscle changes. Soft tissue Soft tissue ulceration along the medial great toe. Large gas containing fluid collection about the great toe, measuring approximately 6.0 x 3.6 x 4.5 cm (AP by transverse by CC), extending along the dorsal forefoot to the mid first metatarsal. IMPRESSION: 1. Soft tissue  ulceration along the medial aspect of the great toe with extensive gas-forming infection of the great toe and osteomyelitis of the first proximal and distal phalanges. Large 6.0 cm abscess about the great toe extending proximally to the mid first metatarsal. 2. Infectious tenosynovitis of the extensor hallucis longus tendon. Electronically Signed   By: Titus Dubin M.D.   On: 10/13/2017 10:07   Dg Foot Complete Right  Result Date: 10/12/2017 CLINICAL DATA:  Right great toe wound. Redness, painful and swollen. EXAM: RIGHT FOOT COMPLETE - 3+ VIEW COMPARISON:  None. FINDINGS: No acute fracture or dislocation. Partial osteolysis of distal medial corner of the first proximal phalanx with a nondisplaced fracture. Soft tissue emphysema in the soft tissue surrounding the great toe extending into the remainder of the forefoot along the medial aspect with soft tissue swelling. No other  fracture or dislocation. No periosteal reaction. Normal alignment. IMPRESSION: 1. Soft tissue emphysema in the soft tissue surrounding the great toe extending into the remainder of the forefoot concerning for gas-forming infection. 2. Partial osteolysis of the distal medial corner of the first proximal phalanx with a nondisplaced fracture concerning for underlying osteomyelitis. Electronically Signed   By: Kathreen Devoid   On: 10/12/2017 14:04   - pertinent xrays, CT, MRI studies were reviewed and independently interpreted  Positive ROS: All other systems have been reviewed and were otherwise negative with the exception of those mentioned in the HPI and as above.  Physical Exam: General: Alert, no acute distress Psychiatric: Patient is competent for consent with normal mood and affect Lymphatic: No axillary or cervical lymphadenopathy Cardiovascular: No pedal edema Respiratory: No cyanosis, no use of accessory musculature GI: No organomegaly, abdomen is soft and non-tender  Skin: Examination of patient's foot he has dry  gangrenous changes of the entire great toe.  There is foul-smelling drainage.  Images:  @ENCIMAGES @   Neurologic: Patient does not have protective sensation bilateral lower extremities.   MUSCULOSKELETAL:  On examination patient has a strong dorsalis pedis and posterior tibial pulse he does have venous stasis swelling.  He has gangrenous changes of the entire great toe to the proximal of the MTP joint.  There is no ascending cellulitis.  The ulcer extends to the second toe.  There are no heel ulcers or other plantar ulcers.  Patient is hemoglobin A1c has ranged from 14.1 currently at 8.7.  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  Plan: Plan: We will plan for a first and second ray amputation of the right foot.  Discussed with the patient if the abscess and infection extends more proximally we would need to return to the operating room for further definitive surgery.  Risks and benefits were discussed including potential for additional amputation surgery.  Patient states she understands and wishes to proceed at this time plan for surgery Saturday morning at 730.  Thank you for the consult and the opportunity to see Mr. Navarro Nine, [place] [phone] 3:59 PM

## 2017-10-13 NOTE — Progress Notes (Signed)
Inpatient Diabetes Program Recommendations  AACE/ADA: New Consensus Statement on Inpatient Glycemic Control (2015)  Target Ranges:  Prepandial:   less than 140 mg/dL      Peak postprandial:   less than 180 mg/dL (1-2 hours)      Critically ill patients:  140 - 180 mg/dL   Lab Results  Component Value Date   GLUCAP 90 10/13/2017   HGBA1C 8.7 (H) 10/12/2017    Review of Glycemic Control  Diabetes history: DM2 Outpatient Diabetes medications: Novolog 0-9 units tidwc and hs Current orders for Inpatient glycemic control: Jardiance 25 mg QD, Amaryl 4 mg QAM, metformin 1000 mg bid, Precose 25 mg tidwc, Kombiglyze 2.11/998 mg bid.   HgbA1C - 8.7%. Pt states this is improved from last one in PCP's office. NPO after MN for surgery Blood sugars trending well since admission.  Agree with orders.  Long discussion with pt regarding his glucose control. Discussed HgbA1C of 8.7% and pt states this is a big improvement from last one in PCP's office. Has tried to eat healthier, has followed carb-mod diet in the past, and had been successful in weight loss. Takes diabetes meds as prescribed. Discussed importance of following heart healthy CHO-mod diet and doing a little exercise each day. Pt states "I just wish this diabetes would be gone." Discussed healthy diet choices and increasing fiber in diet. Pt states he "usually" leaves off sodas and sweet tea. Discussed how glucose control is paramount in wound healing. Pt voices understanding. Pt requests referral to [facility] for further nutrition intervention. States he really does know what to do, just needs to do it. Answered all questions. Will send copy of Living Well with Diabetes book and OP Diabetes Ed order.  Thank you. Lorenda Peck, RD, LDN, CDE Inpatient Diabetes Coordinator [phone]

## 2017-10-14 ENCOUNTER — Encounter (HOSPITAL_COMMUNITY)
Admission: EM | Disposition: A | Discharge status: Rehabilitation Facility | Payer: Self-pay | Source: Home / Self Care | Attending: Internal Medicine

## 2017-10-14 ENCOUNTER — Inpatient Hospital Stay (HOSPITAL_COMMUNITY): Payer: BLUE CROSS/BLUE SHIELD | Admitting: Certified Registered"

## 2017-10-14 ENCOUNTER — Encounter (HOSPITAL_COMMUNITY): Payer: Self-pay | Admitting: Certified Registered"

## 2017-10-14 DIAGNOSIS — L089 Local infection of the skin and subcutaneous tissue, unspecified: Secondary | ICD-10-CM

## 2017-10-14 DIAGNOSIS — L03115 Cellulitis of right lower limb: Secondary | ICD-10-CM

## 2017-10-14 DIAGNOSIS — E11628 Type 2 diabetes mellitus with other skin complications: Secondary | ICD-10-CM

## 2017-10-14 DIAGNOSIS — E11621 Type 2 diabetes mellitus with foot ulcer: Secondary | ICD-10-CM

## 2017-10-14 DIAGNOSIS — E1169 Type 2 diabetes mellitus with other specified complication: Secondary | ICD-10-CM

## 2017-10-14 DIAGNOSIS — L97513 Non-pressure chronic ulcer of other part of right foot with necrosis of muscle: Secondary | ICD-10-CM

## 2017-10-14 DIAGNOSIS — L97509 Non-pressure chronic ulcer of other part of unspecified foot with unspecified severity: Secondary | ICD-10-CM

## 2017-10-14 DIAGNOSIS — M869 Osteomyelitis, unspecified: Secondary | ICD-10-CM

## 2017-10-14 HISTORY — PX: AMPUTATION: SHX166

## 2017-10-14 LAB — BASIC METABOLIC PANEL
ANION GAP: 13 (ref 5–15)
BUN: 25 mg/dL — ABNORMAL HIGH (ref 6–20)
CALCIUM: 8.2 mg/dL — AB (ref 8.9–10.3)
CO2: 21 mmol/L — ABNORMAL LOW (ref 22–32)
CREATININE: 1 mg/dL (ref 0.61–1.24)
Chloride: 98 mmol/L — ABNORMAL LOW (ref 101–111)
Glucose, Bld: 191 mg/dL — ABNORMAL HIGH (ref 65–99)
Potassium: 4.3 mmol/L (ref 3.5–5.1)
Sodium: 132 mmol/L — ABNORMAL LOW (ref 135–145)

## 2017-10-14 LAB — CBC
HCT: 36.2 % — ABNORMAL LOW (ref 39.0–52.0)
HEMOGLOBIN: 11.6 g/dL — AB (ref 13.0–17.0)
MCH: 29.4 pg (ref 26.0–34.0)
MCHC: 32 g/dL (ref 30.0–36.0)
MCV: 91.9 fL (ref 78.0–100.0)
PLATELETS: 376 10*3/uL (ref 150–400)
RBC: 3.94 MIL/uL — AB (ref 4.22–5.81)
RDW: 14.8 % (ref 11.5–15.5)
WBC: 19.5 10*3/uL — ABNORMAL HIGH (ref 4.0–10.5)

## 2017-10-14 LAB — GLUCOSE, CAPILLARY
Glucose-Capillary: 157 mg/dL — ABNORMAL HIGH (ref 65–99)
Glucose-Capillary: 156 mg/dL — ABNORMAL HIGH (ref 65–99)
Glucose-Capillary: 192 mg/dL — ABNORMAL HIGH (ref 65–99)
Glucose-Capillary: 180 mg/dL — ABNORMAL HIGH (ref 65–99)

## 2017-10-14 LAB — SURGICAL PCR SCREEN
MRSA, PCR: NEGATIVE
STAPHYLOCOCCUS AUREUS: NEGATIVE

## 2017-10-14 SURGERY — AMPUTATION DIGIT
Anesthesia: General | Site: Leg Lower | Laterality: Right

## 2017-10-14 MED ORDER — SALINE SPRAY 0.65 % NA SOLN
1.0000 | NASAL | Status: DC | PRN
  Filled 2017-10-14: qty 44

## 2017-10-14 MED ORDER — HYDROMORPHONE HCL 1 MG/ML IJ SOLN: 0.2500 mg | INTRAMUSCULAR | Status: DC | PRN

## 2017-10-14 MED ORDER — 0.9 % SODIUM CHLORIDE (POUR BTL) OPTIME
TOPICAL | Status: DC | PRN
  Administered 2017-10-14: 1000 mL

## 2017-10-14 MED ORDER — FENTANYL CITRATE (PF) 250 MCG/5ML IJ SOLN
INTRAMUSCULAR | Status: AC
  Filled 2017-10-14: qty 5

## 2017-10-14 MED ORDER — FENTANYL CITRATE (PF) 250 MCG/5ML IJ SOLN
INTRAMUSCULAR | Status: DC | PRN
  Administered 2017-10-14: 50 ug via INTRAVENOUS

## 2017-10-14 MED ORDER — CEFAZOLIN SODIUM-DEXTROSE 2-4 GM/100ML-% IV SOLN
2.0000 g | INTRAVENOUS | Status: AC
  Administered 2017-10-14: 2 g via INTRAVENOUS
  Filled 2017-10-14: qty 100

## 2017-10-14 MED ORDER — ONDANSETRON HCL 4 MG/2ML IJ SOLN
INTRAMUSCULAR | Status: AC
  Filled 2017-10-14: qty 2

## 2017-10-14 MED ORDER — PHENYLEPHRINE 40 MCG/ML (10ML) SYRINGE FOR IV PUSH (FOR BLOOD PRESSURE SUPPORT)
PREFILLED_SYRINGE | INTRAVENOUS | Status: DC | PRN
  Administered 2017-10-14: 80 ug via INTRAVENOUS
  Administered 2017-10-14: 200 ug via INTRAVENOUS
  Administered 2017-10-14: 120 ug via INTRAVENOUS

## 2017-10-14 MED ORDER — LACTATED RINGERS IV SOLN
INTRAVENOUS | Status: DC | PRN
  Administered 2017-10-14: 08:00:00 via INTRAVENOUS

## 2017-10-14 MED ORDER — MIDAZOLAM HCL 2 MG/2ML IJ SOLN
INTRAMUSCULAR | Status: AC
  Filled 2017-10-14: qty 2

## 2017-10-14 MED ORDER — LORATADINE 10 MG PO TABS
10.0000 mg | ORAL_TABLET | Freq: Every day | ORAL | Status: DC
  Administered 2017-10-14 – 2017-10-20 (×7): 10 mg via ORAL
  Filled 2017-10-14 (×7): qty 1

## 2017-10-14 MED ORDER — LIDOCAINE 2% (20 MG/ML) 5 ML SYRINGE
INTRAMUSCULAR | Status: DC | PRN
  Administered 2017-10-14: 100 mg via INTRAVENOUS

## 2017-10-14 MED ORDER — PHENYLEPHRINE 40 MCG/ML (10ML) SYRINGE FOR IV PUSH (FOR BLOOD PRESSURE SUPPORT)
PREFILLED_SYRINGE | INTRAVENOUS | Status: AC
  Filled 2017-10-14: qty 10

## 2017-10-14 MED ORDER — EPHEDRINE SULFATE-NACL 50-0.9 MG/10ML-% IV SOSY
PREFILLED_SYRINGE | INTRAVENOUS | Status: DC | PRN
  Administered 2017-10-14: 10 mg via INTRAVENOUS
  Administered 2017-10-14: 15 mg via INTRAVENOUS

## 2017-10-14 MED ORDER — ONDANSETRON HCL 4 MG/2ML IJ SOLN
INTRAMUSCULAR | Status: DC | PRN
  Administered 2017-10-14: 4 mg via INTRAVENOUS

## 2017-10-14 MED ORDER — ONDANSETRON HCL 4 MG/2ML IJ SOLN: 4.0000 mg | Freq: Once | INTRAMUSCULAR | Status: DC | PRN

## 2017-10-14 MED ORDER — MIDAZOLAM HCL 2 MG/2ML IJ SOLN
INTRAMUSCULAR | Status: DC | PRN
  Administered 2017-10-14: 2 mg via INTRAVENOUS

## 2017-10-14 MED ORDER — PROPOFOL 10 MG/ML IV BOLUS
INTRAVENOUS | Status: DC | PRN
  Administered 2017-10-14: 150 mg via INTRAVENOUS

## 2017-10-14 MED ORDER — LIDOCAINE HCL (CARDIAC) 20 MG/ML IV SOLN
INTRAVENOUS | Status: AC
  Filled 2017-10-14: qty 5

## 2017-10-14 MED ORDER — PROPOFOL 10 MG/ML IV BOLUS
INTRAVENOUS | Status: AC
  Filled 2017-10-14: qty 20

## 2017-10-14 MED ORDER — POVIDONE-IODINE 10 % EX SWAB: 2.0000 "application " | Freq: Once | CUTANEOUS | Status: DC

## 2017-10-14 MED ORDER — MEPERIDINE HCL 50 MG/ML IJ SOLN: 6.2500 mg | INTRAMUSCULAR | Status: DC | PRN

## 2017-10-14 MED ORDER — CHLORHEXIDINE GLUCONATE 4 % EX LIQD
60.0000 mL | Freq: Once | CUTANEOUS | Status: DC
  Filled 2017-10-14: qty 60

## 2017-10-14 MED ORDER — EPHEDRINE 5 MG/ML INJ
INTRAVENOUS | Status: AC
  Filled 2017-10-14: qty 10

## 2017-10-14 SURGICAL SUPPLY — 32 items
BLADE SURG 21 STRL SS (BLADE) ×2 IMPLANT
BNDG CMPR 9X4 STRL LF SNTH (GAUZE/BANDAGES/DRESSINGS)
BNDG COHESIVE 4X5 TAN STRL (GAUZE/BANDAGES/DRESSINGS) ×2 IMPLANT
BNDG COHESIVE 6X5 TAN STRL LF (GAUZE/BANDAGES/DRESSINGS) ×1 IMPLANT
BNDG ESMARK 4X9 LF (GAUZE/BANDAGES/DRESSINGS) IMPLANT
BNDG GAUZE ELAST 4 BULKY (GAUZE/BANDAGES/DRESSINGS) ×2 IMPLANT
COVER SURGICAL LIGHT HANDLE (MISCELLANEOUS) ×4 IMPLANT
DRAPE U-SHAPE 47X51 STRL (DRAPES) ×2 IMPLANT
DRESSING ADAPTIC 1/2  N-ADH (PACKING) ×1 IMPLANT
DRSG ADAPTIC 3X8 NADH LF (GAUZE/BANDAGES/DRESSINGS) IMPLANT
DRSG PAD ABDOMINAL 8X10 ST (GAUZE/BANDAGES/DRESSINGS) ×2 IMPLANT
DURAPREP 26ML APPLICATOR (WOUND CARE) ×2 IMPLANT
ELECT REM PT RETURN 9FT ADLT (ELECTROSURGICAL) ×2
ELECTRODE REM PT RTRN 9FT ADLT (ELECTROSURGICAL) ×1 IMPLANT
GAUZE SPONGE 4X4 12PLY STRL (GAUZE/BANDAGES/DRESSINGS) IMPLANT
GAUZE SPONGE 4X4 16PLY XRAY LF (GAUZE/BANDAGES/DRESSINGS) ×1 IMPLANT
GLOVE BIOGEL PI IND STRL 9 (GLOVE) ×1 IMPLANT
GLOVE BIOGEL PI INDICATOR 9 (GLOVE) ×1
GLOVE SURG ORTHO 9.0 STRL STRW (GLOVE) ×2 IMPLANT
GOWN STRL REUS W/ TWL XL LVL3 (GOWN DISPOSABLE) ×2 IMPLANT
GOWN STRL REUS W/TWL XL LVL3 (GOWN DISPOSABLE) ×4
KIT BASIN OR (CUSTOM PROCEDURE TRAY) ×2 IMPLANT
KIT ROOM TURNOVER OR (KITS) ×2 IMPLANT
MANIFOLD NEPTUNE II (INSTRUMENTS) ×2 IMPLANT
NEEDLE 22X1 1/2 (OR ONLY) (NEEDLE) IMPLANT
NS IRRIG 1000ML POUR BTL (IV SOLUTION) ×2 IMPLANT
PACK ORTHO EXTREMITY (CUSTOM PROCEDURE TRAY) ×2 IMPLANT
PAD ABD 8X10 STRL (GAUZE/BANDAGES/DRESSINGS) ×1 IMPLANT
PAD ARMBOARD 7.5X6 YLW CONV (MISCELLANEOUS) ×4 IMPLANT
SUT ETHILON 2 0 PSLX (SUTURE) ×2 IMPLANT
SYR CONTROL 10ML LL (SYRINGE) IMPLANT
TOWEL OR 17X26 10 PK STRL BLUE (TOWEL DISPOSABLE) ×2 IMPLANT

## 2017-10-14 NOTE — Op Note (Signed)
10/12/2017 - 10/14/2017  8:07 AM  PATIENT:  Brian Herring    PRE-OPERATIVE DIAGNOSIS:  diabetic foot ulcer with osteomyelitis  POST-OPERATIVE DIAGNOSIS:  Same  PROCEDURE:  AMPUTATION and first and second ray right foot.  SURGEON:  [name], MD  PHYSICIAN ASSISTANT:None ANESTHESIA:   General  PREOPERATIVE INDICATIONS:  Brian Herring is a  62 y.o. male with a diagnosis of diabetic foot ulcer with osteomyelitis who failed conservative measures and elected for surgical management.    The risks benefits and alternatives were discussed with the patient preoperatively including but not limited to the risks of infection, bleeding, nerve injury, cardiopulmonary complications, the need for revision surgery, among others, and the patient was willing to proceed.  OPERATIVE IMPLANTS: Cultures obtained x2 wound packed open.  @ENCIMAGES @  OPERATIVE FINDINGS: Abscess extending up to the ankle.  OPERATIVE PROCEDURE: Patient was brought the operating room and underwent a general anesthetic.  After adequate levels of anesthesia were obtained patient's right lower extremity was prepped using DuraPrep draped into a sterile field a timeout was called.  A racquet incision was made around the necrotic tissue to include the first and second rays.  The abscess deep extended up into the hindfoot.  There was no tracking proximal to the ankle.  The abscess was irrigated and debrided the first and second rays were resected.  Electrocautery was used for hemostasis.  The wound was irrigated.  The wound edges were grossly viable.  Patient does not have enough soft tissue for foot salvage due to the abscess extending back to the hindfoot.  The wound was packed open and a sterile dressing was applied.  Patient will need to return for a transtibial amputation.   DISCHARGE PLANNING:  Antibiotic duration: Continue IV antibiotics for the remainder of this week.  Weightbearing: Nonweightbearing on the  right.  Pain medication: Continue current pain medication.  Dressing care/ Wound VAC: Dressing may be reinforced as needed.  Ambulatory devices: Walker.  Discharge to: Plan for transtibial amputation this week and patient will need discharge to skilled nursing.  Follow-up: In the office 1 week post operative.

## 2017-10-14 NOTE — Progress Notes (Signed)
PROGRESS NOTE    Brian Herring  QZR:[id number] DOB: September 29, 1955 DOA: 10/12/2017 PCP: [name], MD   Brief Narrative:  HPI on 10/12/2017 by Dr. [name] Brian Herring is a 62 y.o. male with medical history significant of hypertension, hyperlipidemia, diabetes mellitus, sCHF with EF 25%, who presents with right foot ulcer with infection.  Pt states that he has right great toe ulcer which has been going on for more than 6 months. About one week ago, he noticed purple-black color change in his right great toe. His right foot becomes swelling, erythematous with foul odors coming from his left great toe. He has not tried anything at home. He also has into meeting to fever and chills. He has mild tenderness in the right foot. Patient denies chest pain, shortness breath, cough. He has nausea, no vomiting, diarrhea or abdominal pain. Denies symptoms of UTI or unilateral weakness.  Interim history Admitted for R foot infection and cellulitis. MRI showed osteo, ortho consulted.  Assessment & Plan   Secondary to right great toe osteomyelitis and cellulitis -Patient presented with leukocytosis, tachycardia, tachypnea, fever -X-ray findings are concerning for gas-forming infection and osteomyelitis -Placed on vancomycin, Flagyl, Rocephin -Continue pain control -ESR 125, CRP 32.4 -MRI right foot showed soft tissue ulceration along the medial aspect of the great toe with extensive gas forming infection of the great toe and osteomyelitis of the first proximal and distal phalanges.  Large 6 cm abscess about the great toe extending proximally to the mid first metatarsal.  Infectious tenosynovitis of the extensor hallux longus tendon -ABI WNL B/L (right 1.09, Left 1.17). Right TBI could not be ascertained. Left TBI 1.32 -Orthopedic surgery consulted and appreciated, status post amputation of first and second ray right foot -per note from Dr. Sharol Given- patient will likely need transtibial amputation,  continue IV antibiotics. Nonweightbearing on the right  Diabetes mellitus, type II, poorly controlled -Continue insulin sliding scale and CBG monitoring -Home medications held -Hemoglobin A1c 8.7  Chronic systolic heart failure -Echocardiogram 09/02/2015: EF of 25-30% -Patient currently appears to be compensated and euvolemic -Lasix and spironolactone held due to sepsis, likely restart medications within 24 hours ending course -Continue Entresto, metoprolol  Hyponatremia, chronic -Suspect secondary to diuretics and Entresto -After reviewing patient's chart, appears to be a chronic issue -Sodium currently 132 -Continue to monitor BMP  Essential hypertension -Continue metoprolol, Entresto  Hyperlipidemia -Continue statin  DVT Prophylaxis  heparin  Code Status: Full  Family Communication: none at bedside  Disposition Plan: Admitted. Pending further surgery, SNF placement?  Consultants Orthopedic surgery  Procedures   ABI Amputation of 1st and 2nd right ray  Antibiotics   Anti-infectives (From admission, onward)   Start     Dose/Rate Route Frequency Ordered Stop   10/14/17 0600  ceFAZolin (ANCEF) IVPB 2g/100 mL premix     2 g 200 mL/hr over 30 Minutes Intravenous On call to O.R. 10/14/17 0559 10/14/17 0738   10/13/17 0300  vancomycin (VANCOCIN) IVPB 750 mg/150 ml premix     750 mg 150 mL/hr over 60 Minutes Intravenous Every 12 hours 10/12/17 1435     10/12/17 2130  cefTRIAXone (ROCEPHIN) 2 g in sodium chloride 0.9 % 100 mL IVPB     2 g 200 mL/hr over 30 Minutes Intravenous Every 24 hours 10/12/17 2036     10/12/17 2045  metroNIDAZOLE (FLAGYL) IVPB 500 mg     500 mg 100 mL/hr over 60 Minutes Intravenous Every 8 hours 10/12/17 2036  10/12/17 2000  piperacillin-tazobactam (ZOSYN) IVPB 3.375 g  Status:  Discontinued     3.375 g 12.5 mL/hr over 240 Minutes Intravenous Every 8 hours 10/12/17 1435 10/12/17 2036   10/12/17 1545  vancomycin (VANCOCIN) IVPB 1000 mg/200 mL  premix  Status:  Discontinued     1,000 mg 200 mL/hr over 60 Minutes Intravenous  Once 10/12/17 1442 10/12/17 2103   10/12/17 1445  vancomycin (VANCOCIN) IVPB 1000 mg/200 mL premix     1,000 mg 200 mL/hr over 60 Minutes Intravenous  Once 10/12/17 1442 10/12/17 1545   10/12/17 1430  vancomycin (VANCOCIN) 2,000 mg in sodium chloride 0.9 % 500 mL IVPB  Status:  Discontinued     2,000 mg 250 mL/hr over 120 Minutes Intravenous  Once 10/12/17 1425 10/12/17 1442   10/12/17 1400  vancomycin (VANCOCIN) IVPB 1000 mg/200 mL premix  Status:  Discontinued     1,000 mg 200 mL/hr over 60 Minutes Intravenous  Once 10/12/17 1348 10/12/17 1425   10/12/17 1345  piperacillin-tazobactam (ZOSYN) IVPB 3.375 g     3.375 g 100 mL/hr over 30 Minutes Intravenous  Once 10/12/17 1339 10/12/17 1441      Subjective:   Brian Herring seen and examined today.    States he is feeling better today.  Does not feel as much pain in his foot.  Denies current chest pain, shortness breath, abdominal pain, nausea or vomiting, diarrhea or constipation.  Objective:   Vitals:   10/14/17 0830 10/14/17 0845 10/14/17 0859 10/14/17 0917  BP: 112/69 114/69 109/67 99/60  Pulse: 90 92 88 87  Resp: (!) 24 15 (!) 21   Temp:   97.7 F (36.5 C) 97.7 F (36.5 C)  TempSrc:    Axillary  SpO2: 95% 97% 94%   Weight:      Height:        Intake/Output Summary (Last 24 hours) at 10/14/2017 1251 Last data filed at 10/14/2017 1238 Gross per 24 hour  Intake 600 ml  Output 1050 ml  Net -450 ml   Filed Weights   10/12/17 1219 10/13/17 0500  Weight: 108.9 kg (240 lb) 108 kg (238 lb 1.6 oz)   Exam  General: Well developed, well nourished, NAD, appears stated age  HEENT: NCAT, mucous membranes moist.   Neck: Supple  Cardiovascular: S1 S2 auscultated, no murmur, RRR  Respiratory: Clear to auscultation bilaterally with equal chest rise  Abdomen: Soft, nontender, nondistended, + bowel sounds  Extremities: warm dry without cyanosis  clubbing or edema. RLE cellulitis improving, foot currently wrapped.  Neuro: AAOx3, nonfocal  Psych: Normal affect and demeanor with intact judgement and insight   Data Reviewed: I have personally reviewed following labs and imaging studies  CBC: Recent Labs  Lab 10/12/17 1315 10/13/17 0041 10/14/17 0552  WBC 23.5* 22.1* 19.5*  NEUTROABS 20.7*  --   --   HGB 13.2 11.3* 11.6*  HCT 39.7 35.4* 36.2*  MCV 89.8 91.2 91.9  PLT 351 333 876   Basic Metabolic Panel: Recent Labs  Lab 10/12/17 1315 10/12/17 2107 10/13/17 0041 10/14/17 0552  NA 129* 132* 130* 132*  K 4.4 4.2 4.7 4.3  CL 94* 99* 97* 98*  CO2 21* 21* 22 21*  GLUCOSE 266* 105* 178* 191*  BUN 29* 23* 25* 25*  CREATININE 1.17 1.04 1.11 1.00  CALCIUM 8.8* 8.3* 8.0* 8.2*   GFR: Estimated Creatinine Clearance: 98.5 mL/min (by C-G formula based on SCr of 1 mg/dL). Liver Function Tests: Recent Labs  Lab 10/12/17  1315  AST 26  ALT 28  ALKPHOS 113  BILITOT 1.2  PROT 8.0  ALBUMIN 3.4*   No results for input(s): LIPASE, AMYLASE in the last 168 hours. No results for input(s): AMMONIA in the last 168 hours. Coagulation Profile: Recent Labs  Lab 10/12/17 2107  INR 1.28   Cardiac Enzymes: No results for input(s): CKTOTAL, CKMB, CKMBINDEX, TROPONINI in the last 168 hours. BNP (last 3 results) No results for input(s): PROBNP in the last 8760 hours. HbA1C: Recent Labs    10/12/17 2107  HGBA1C 8.7*   CBG: Recent Labs  Lab 10/13/17 0926 10/13/17 1247 10/13/17 1707 10/13/17 2128 10/14/17 0818  GLUCAP 110* 90 192* 320* 157*   Lipid Profile: No results for input(s): CHOL, HDL, LDLCALC, TRIG, CHOLHDL, LDLDIRECT in the last 72 hours. Thyroid Function Tests: Recent Labs    10/13/17 0041  TSH 1.297   Anemia Panel: No results for input(s): VITAMINB12, FOLATE, FERRITIN, TIBC, IRON, RETICCTPCT in the last 72 hours. Urine analysis:    Component Value Date/Time   COLORURINE YELLOW 10/12/2017 [place] 10/12/2017 1348   LABSPEC <1.005 (L) 10/12/2017 1348   PHURINE 5.5 10/12/2017 1348   GLUCOSEU >=500 (A) 10/12/2017 1348   HGBUR NEGATIVE 10/12/2017 1348   BILIRUBINUR NEGATIVE 10/12/2017 1348   KETONESUR 15 (A) 10/12/2017 1348   PROTEINUR NEGATIVE 10/12/2017 1348   NITRITE NEGATIVE 10/12/2017 1348   LEUKOCYTESUR NEGATIVE 10/12/2017 1348   Sepsis Labs: _0 (procalcitonin:4,lacticidven:4)  ) Recent Results (from the past 240 hour(s))  Culture, blood (routine x 2)     Status: None (Preliminary result)   Collection Time: 10/12/17  1:14 PM  Result Value Ref Range Status   Specimen Description   Final    BLOOD RIGHT ANTECUBITAL Performed at [facility], [address] [place], [place] [address]    Special Requests   Final    BOTTLES DRAWN AEROBIC AND ANAEROBIC Blood Culture adequate volume Performed at [facility], [address] [place], [place] [address]    Culture   Final    NO GROWTH < 24 HOURS Performed at [facility], [place] [address], [place], [place] [address]    Report Status PENDING  Incomplete  Culture, blood (routine x 2)     Status: None (Preliminary result)   Collection Time: 10/12/17  2:00 PM  Result Value Ref Range Status   Specimen Description   Final    BLOOD LEFT ANTECUBITAL Performed at [facility], [address] [place], [place] [address]    Special Requests   Final    BOTTLES DRAWN AEROBIC AND ANAEROBIC Blood Culture adequate volume Performed at [facility], [address] [place], [place] [address]    Culture   Final    NO GROWTH < 24 HOURS Performed at [facility], [place] [address] [place], [place] [address]    Report Status PENDING  Incomplete  MRSA PCR Screening     Status: None   Collection Time: 10/13/17  4:25 PM  Result Value Ref Range Status   MRSA by PCR NEGATIVE NEGATIVE Final    Comment:        The GeneXpert MRSA Assay (FDA approved for NASAL  specimens only), is one component of a comprehensive MRSA colonization surveillance program. It is not intended to diagnose MRSA infection nor to guide or monitor treatment for MRSA infections. Performed at [facility], [address] [place], [place] [address]   Surgical pcr screen     Status: None   Collection Time: 10/14/17  4:36 AM  Result Value Ref Range Status   MRSA, PCR NEGATIVE NEGATIVE Final   Staphylococcus aureus NEGATIVE NEGATIVE Final    Comment: (NOTE) The Xpert SA Assay (FDA approved for NASAL specimens in patients 52 years of age and older), is one component of a comprehensive surveillance program. It is not intended to diagnose infection nor to guide or monitor treatment. Performed at [facility], [place] [address], [place], [place] [address]       Radiology Studies: Mr Foot Right W Wo Contrast  Result Date: 10/13/2017 CLINICAL DATA:  Chronic right great toe ulcer with new discoloration, swelling, and foul odor. Evaluate for osteomyelitis. EXAM: MRI OF THE RIGHT FOREFOOT WITHOUT AND WITH CONTRAST TECHNIQUE: Multiplanar, multisequence MR imaging of the right forefoot was performed before and after the administration of intravenous contrast. CONTRAST:  70m MULTIHANCE GADOBENATE DIMEGLUMINE 529 MG/ML IV SOLN COMPARISON:  Right foot x-rays from yesterday. FINDINGS: Bones/Joint/Cartilage There is prominent edema and enhancement with corresponding T1 marrow signal intensity involving the first proximal and distal phalanges, consistent with osteomyelitis. No fracture or dislocation. Normal alignment. Small first IP joint and first MTP joint effusions. Ligaments Collateral ligaments are intact.  Lisfranc ligament is intact. Muscles and Tendons There is prominent fluid and enhancement within the extensor hallucis longus tendon sheath, consistent with tenosynovitis. This extends proximally to the level of the navicular. Increased T2 signal within the intrinsic  muscles of the forefoot, nonspecific, but likely related to diabetic muscle changes. Soft tissue Soft tissue ulceration along the medial great toe. Large gas containing fluid collection about the great toe, measuring approximately 6.0 x 3.6 x 4.5 cm (AP by transverse by CC), extending along the dorsal forefoot to the mid first metatarsal. IMPRESSION: 1. Soft tissue ulceration along the medial aspect of the great toe with extensive gas-forming infection of the great toe and osteomyelitis of the first proximal and distal phalanges. Large 6.0 cm abscess about the great toe extending proximally to the mid first metatarsal. 2. Infectious tenosynovitis of the extensor hallucis longus tendon. Electronically Signed   By: WTitus DubinM.D.   On: 10/13/2017 10:07   Dg Foot Complete Right  Result Date: 10/12/2017 CLINICAL DATA:  Right great toe wound. Redness, painful and swollen. EXAM: RIGHT FOOT COMPLETE - 3+ VIEW COMPARISON:  None. FINDINGS: No acute fracture or dislocation. Partial osteolysis of distal medial corner of the first proximal phalanx with a nondisplaced fracture. Soft tissue emphysema in the soft tissue surrounding the great toe extending into the remainder of the forefoot along the medial aspect with soft tissue swelling. No other fracture or dislocation. No periosteal reaction. Normal alignment. IMPRESSION: 1. Soft tissue emphysema in the soft tissue surrounding the great toe extending into the remainder of the forefoot concerning for gas-forming infection. 2. Partial osteolysis of the distal medial corner of the first proximal phalanx with a nondisplaced fracture concerning for underlying osteomyelitis. Electronically Signed   By: HKathreen Devoid  On: 10/12/2017 14:04     Scheduled Meds: . aspirin  81 mg Oral [id number]  . atorvastatin  80 mg Oral Daily  . feeding supplement (PRO-STAT SUGAR FREE 64)  30 mL Oral BID  . heparin  5,000 Units Subcutaneous Q8H  . insulin aspart  0-5 Units Subcutaneous  QHS  . insulin aspart  0-9 Units Subcutaneous TID WC  . living  well with diabetes book   Does not apply Once  . metoprolol succinate  25 mg Oral QPM  . sacubitril-valsartan  1 tablet Oral BID   Continuous Infusions: . cefTRIAXone (ROCEPHIN)  IV Stopped (10/13/17 2250)  . metronidazole Stopped (10/14/17 1338)  . vancomycin Stopped (10/14/17 0351)     LOS: 2 days   Time Spent in minutes   30 minutes  Kaelene Elliston D.O. on 10/14/2017 at 12:51 PM  Between 7am to 7pm - Pager - [phone]  After 7pm go to [email or web address] - password [email or web address]  And look for the night coverage person covering for me after hours  [facility] Office  [phone]

## 2017-10-14 NOTE — Anesthesia Procedure Notes (Signed)
Procedure Name: LMA Insertion Date/Time: 10/14/2017 7:37 AM Performed by: Barrington Ellison, CRNA Pre-anesthesia Checklist: Patient identified, Emergency Drugs available, Suction available and Patient being monitored Patient Re-evaluated:Patient Re-evaluated prior to induction Oxygen Delivery Method: Circle System Utilized Preoxygenation: Pre-oxygenation with 100% oxygen Induction Type: IV induction Ventilation: Mask ventilation without difficulty LMA: LMA inserted LMA Size: 5.0 Number of attempts: 1 Placement Confirmation: positive ETCO2 Tube secured with: Tape Dental Injury: Teeth and Oropharynx as per pre-operative assessment

## 2017-10-14 NOTE — Anesthesia Preprocedure Evaluation (Addendum)
Anesthesia Evaluation  Patient identified by MRN, date of birth, ID band Patient awake    Reviewed: Allergy & Precautions, NPO status , Patient's Chart, lab work & pertinent test results  Airway Mallampati: II  TM Distance: >3 FB Neck ROM: Full    Dental  (+) Teeth Intact, Missing, Dental Advisory Given,    Pulmonary    Pulmonary exam normal        Cardiovascular hypertension, Pt. on medications Normal cardiovascular exam     Neuro/Psych    GI/Hepatic   Endo/Other  diabetes, Type 2, Oral Hypoglycemic Agents  Renal/GU      Musculoskeletal   Abdominal   Peds  Hematology   Anesthesia Other Findings   Reproductive/Obstetrics                            Anesthesia Physical Anesthesia Plan  ASA: III  Anesthesia Plan: General   Post-op Pain Management:    Induction: Intravenous  PONV Risk Score and Plan: 2 and Ondansetron, Treatment may vary due to age or medical condition and Midazolam  Airway Management Planned: LMA  Additional Equipment:   Intra-op Plan:   Post-operative Plan: Extubation in OR  Informed Consent: I have reviewed the patients History and Physical, chart, labs and discussed the procedure including the risks, benefits and alternatives for the proposed anesthesia with the patient or authorized representative who has indicated his/her understanding and acceptance.     Plan Discussed with: CRNA and Surgeon  Anesthesia Plan Comments:         Anesthesia Quick Evaluation

## 2017-10-14 NOTE — Progress Notes (Signed)
Patient back from OR. Alert and oriented x 4. No acute distress noted, no complaints. VS stable. RLE dressing intact, clean, dry. Will continue to monitor.

## 2017-10-14 NOTE — Anesthesia Postprocedure Evaluation (Signed)
Anesthesia Post Note  Patient: Brian Herring  Procedure(s) Performed: AMPUTATION  GREAT TOE (Right Leg Lower)     Patient location during evaluation: PACU Anesthesia Type: General Level of consciousness: awake and alert Pain management: pain level controlled Vital Signs Assessment: post-procedure vital signs reviewed and stable Respiratory status: spontaneous breathing, nonlabored ventilation, respiratory function stable and patient connected to nasal cannula oxygen Cardiovascular status: blood pressure returned to baseline and stable Postop Assessment: no apparent nausea or vomiting Anesthetic complications: no    Last Vitals:  Vitals:   10/14/17 0859 10/14/17 0917  BP: 109/67 99/60  Pulse: 88 87  Resp: (!) 21   Temp: 36.5 C 36.5 C  SpO2: 94%     Last Pain:  Vitals:   10/14/17 0933  TempSrc:   PainSc: 0-No pain                 Malynda Smolinski DAVID

## 2017-10-14 NOTE — Progress Notes (Signed)
RN paged Jeannette Corpus, NP to request something for patient's nasal drainage per patients request, awaiting response.  P.J. Linus Mako, RN

## 2017-10-14 NOTE — Transfer of Care (Signed)
Immediate Anesthesia Transfer of Care Note  Patient: Brian Herring  Procedure(s) Performed: AMPUTATION  GREAT TOE (Right Leg Lower)  Patient Location: PACU  Anesthesia Type:General  Level of Consciousness: lethargic and responds to stimulation  Airway & Oxygen Therapy: Patient Spontanous Breathing and Patient connected to nasal cannula oxygen  Post-op Assessment: Report given to RN  Post vital signs: Reviewed and stable  Last Vitals:  Vitals Value Taken Time  BP    Temp    Pulse 95 10/14/2017  8:16 AM  Resp    SpO2 92 % 10/14/2017  8:16 AM  Vitals shown include unvalidated device data.  Last Pain:  Vitals:   10/14/17 0435  TempSrc: Oral  PainSc:          Complications: No apparent anesthesia complications

## 2017-10-14 NOTE — Interval H&P Note (Signed)
History and Physical Interval Note:  10/14/2017 7:39 AM  Brian Herring  has presented today for surgery, with the diagnosis of diabetic foot ulcer with osteomyelitis  The various methods of treatment have been discussed with the patient and family. After consideration of risks, benefits and other options for treatment, the patient has consented to  Procedure(s): AMPUTATION  GREAT TOE (Right) as a surgical intervention .  The patient's history has been reviewed, patient examined, no change in status, stable for surgery.  I have reviewed the patient's chart and labs.  Questions were answered to the patient's satisfaction.     [name]

## 2017-10-15 ENCOUNTER — Encounter (HOSPITAL_COMMUNITY): Payer: Self-pay | Admitting: Orthopedic Surgery

## 2017-10-15 LAB — GLUCOSE, CAPILLARY
GLUCOSE-CAPILLARY: 156 mg/dL — AB (ref 65–99)
GLUCOSE-CAPILLARY: 195 mg/dL — AB (ref 65–99)
Glucose-Capillary: 174 mg/dL — ABNORMAL HIGH (ref 65–99)
Glucose-Capillary: 202 mg/dL — ABNORMAL HIGH (ref 65–99)

## 2017-10-15 LAB — CBC
HEMATOCRIT: 34.6 % — AB (ref 39.0–52.0)
Hemoglobin: 10.9 g/dL — ABNORMAL LOW (ref 13.0–17.0)
MCH: 29.1 pg (ref 26.0–34.0)
MCHC: 31.5 g/dL (ref 30.0–36.0)
MCV: 92.5 fL (ref 78.0–100.0)
Platelets: 399 10*3/uL (ref 150–400)
RBC: 3.74 MIL/uL — ABNORMAL LOW (ref 4.22–5.81)
RDW: 14.8 % (ref 11.5–15.5)
WBC: 16.8 10*3/uL — ABNORMAL HIGH (ref 4.0–10.5)

## 2017-10-15 LAB — BASIC METABOLIC PANEL
Anion gap: 10 (ref 5–15)
BUN: 21 mg/dL — ABNORMAL HIGH (ref 6–20)
CALCIUM: 8.4 mg/dL — AB (ref 8.9–10.3)
CO2: 25 mmol/L (ref 22–32)
CREATININE: 0.92 mg/dL (ref 0.61–1.24)
Chloride: 99 mmol/L — ABNORMAL LOW (ref 101–111)
GFR calc Af Amer: >60 mL/min (ref >60)
GFR calc non Af Amer: >60 mL/min (ref >60)
GLUCOSE: 180 mg/dL — AB (ref 65–99)
Potassium: 4.2 mmol/L (ref 3.5–5.1)
Sodium: 134 mmol/L — ABNORMAL LOW (ref 135–145)

## 2017-10-15 LAB — VANCOMYCIN, TROUGH: Vancomycin Tr: 14 ug/mL — ABNORMAL LOW (ref 15–20)

## 2017-10-15 MED ORDER — SACCHAROMYCES BOULARDII 250 MG PO CAPS
250.0000 mg | ORAL_CAPSULE | Freq: Two times a day (BID) | ORAL | Status: DC
  Administered 2017-10-15 – 2017-10-20 (×10): 250 mg via ORAL
  Filled 2017-10-15 (×9): qty 1

## 2017-10-15 NOTE — Progress Notes (Signed)
Bed bug found in patient's bed and placed in specimen container.  Patient states he had bed bugs, but thought he got rid of them.  Patient placed on contact isolation.  P.J. Linus Mako, RN

## 2017-10-15 NOTE — Progress Notes (Signed)
PROGRESS NOTE    Brian Herring  OTL:[id number] DOB: 11/22/1955 DOA: 10/12/2017 PCP: Brian Cruel, MD   Brief Narrative:  HPI on 10/12/2017 by Dr. Ivor Herring Brian Herring is a 62 y.o. male with medical history significant of hypertension, hyperlipidemia, diabetes mellitus, sCHF with EF 25%, who presents with right foot ulcer with infection.  Pt states that he has right great toe ulcer which has been going on for more than 6 months. About one week ago, he noticed purple-black color change in his right great toe. His right foot becomes swelling, erythematous with foul odors coming from his left great toe. He has not tried anything at home. He also has into meeting to fever and chills. He has mild tenderness in the right foot. Patient denies chest pain, shortness breath, cough. He has nausea, no vomiting, diarrhea or abdominal pain. Denies symptoms of UTI or unilateral weakness.  Interim history Admitted for R foot infection and cellulitis. MRI showed osteo, ortho consulted.  Assessment & Plan   Secondary to right great toe osteomyelitis and cellulitis -Patient presented with leukocytosis, tachycardia, tachypnea, fever- all improving  -X-ray findings are concerning for gas-forming infection and osteomyelitis -Placed on vancomycin, Flagyl, Rocephin -Continue pain control -ESR 125, CRP 32.4 -MRI right foot showed soft tissue ulceration along the medial aspect of the great toe with extensive gas forming infection of the great toe and osteomyelitis of the first proximal and distal phalanges.  Large 6 cm abscess about the great toe extending proximally to the mid first metatarsal.  Infectious tenosynovitis of the extensor hallux longus tendon -ABI WNL B/L (right 1.09, Left 1.17). Right TBI could not be ascertained. Left TBI 1.32 -Orthopedic surgery consulted and appreciated, status post amputation of first and second ray right foot -per note from Dr. Sharol Herring- patient will likely need  transtibial amputation, continue IV antibiotics. Nonweightbearing on the right -Discussed with Dr. Sharol Herring, plan for transtibial amputation on 10/18/17 as patient had an abscess extending up the ankle. Does not have enough tissue for foot salvage.   Diabetes mellitus, type II, poorly controlled -Continue insulin sliding scale and CBG monitoring -Home medications held -Hemoglobin A1c 8.7  Chronic systolic heart failure -Echocardiogram 09/02/2015: EF of 25-30% -Patient currently appears to be compensated and euvolemic -Lasix and spironolactone held due to sepsis -Continue Entresto, metoprolol  Hyponatremia, chronic -Suspect secondary to diuretics and Entresto -After reviewing patient's chart, appears to be a chronic issue -Sodium currently 134 -Continue to monitor BMP  Essential hypertension -Continue metoprolol, Entresto  Hyperlipidemia -Continue statin  Diarrhea -likely secondary to antibiotics -will place on florastor   Bed bugs -one was found yesterday evening by RN -patient thought they had been eradicated -continue contact precautions  DVT Prophylaxis  heparin  Code Status: Full  Family Communication: none at bedside  Disposition Plan: Admitted. Pending further surgery, SNF placement?  Consultants Orthopedic surgery  Procedures   ABI Amputation of 1st and 2nd right ray  Antibiotics   Anti-infectives (From admission, onward)   Start     Dose/Rate Route Frequency Ordered Stop   10/14/17 0600  ceFAZolin (ANCEF) IVPB 2g/100 mL premix     2 g 200 mL/hr over 30 Minutes Intravenous On call to O.R. 10/14/17 0559 10/14/17 0738   10/13/17 0300  vancomycin (VANCOCIN) IVPB 750 mg/150 ml premix     750 mg 150 mL/hr over 60 Minutes Intravenous Every 12 hours 10/12/17 1435     10/12/17 2130  cefTRIAXone (ROCEPHIN) 2 g in sodium chloride  0.9 % 100 mL IVPB     2 g 200 mL/hr over 30 Minutes Intravenous Every 24 hours 10/12/17 2036     10/12/17 2045  metroNIDAZOLE (FLAGYL)  IVPB 500 mg     500 mg 100 mL/hr over 60 Minutes Intravenous Every 8 hours 10/12/17 2036     10/12/17 2000  piperacillin-tazobactam (ZOSYN) IVPB 3.375 g  Status:  Discontinued     3.375 g 12.5 mL/hr over 240 Minutes Intravenous Every 8 hours 10/12/17 1435 10/12/17 2036   10/12/17 1545  vancomycin (VANCOCIN) IVPB 1000 mg/200 mL premix  Status:  Discontinued     1,000 mg 200 mL/hr over 60 Minutes Intravenous  Once 10/12/17 1442 10/12/17 2103   10/12/17 1445  vancomycin (VANCOCIN) IVPB 1000 mg/200 mL premix     1,000 mg 200 mL/hr over 60 Minutes Intravenous  Once 10/12/17 1442 10/12/17 1545   10/12/17 1430  vancomycin (VANCOCIN) 2,000 mg in sodium chloride 0.9 % 500 mL IVPB  Status:  Discontinued     2,000 mg 250 mL/hr over 120 Minutes Intravenous  Once 10/12/17 1425 10/12/17 1442   10/12/17 1400  vancomycin (VANCOCIN) IVPB 1000 mg/200 mL premix  Status:  Discontinued     1,000 mg 200 mL/hr over 60 Minutes Intravenous  Once 10/12/17 1348 10/12/17 1425   10/12/17 1345  piperacillin-tazobactam (ZOSYN) IVPB 3.375 g     3.375 g 100 mL/hr over 30 Minutes Intravenous  Once 10/12/17 1339 10/12/17 1441      Subjective:   Brian Herring seen and examined today.    Feeling better today. Complains of diarrhea. Denies current abdominal pain, nausea, vomiting. Denies chest pain, shortness of breath.  Objective:   Vitals:   10/14/17 0917 10/14/17 1409 10/14/17 2117 10/15/17 0519  BP: 99/60 (!) 104/59 (!) 114/59 105/64  Pulse: 87 94 96 92  Resp:  _0 Temp: 97.7 F (36.5 C) (!) 97.5 F (36.4 C) 99.5 F (37.5 C) 99.2 F (37.3 C)  TempSrc: Axillary Oral Oral Oral  SpO2:  94% 95% 95%  Weight:    108.2 kg (238 lb 8 oz)  Height:        Intake/Output Summary (Last 24 hours) at 10/15/2017 1118 Last data filed at 10/15/2017 0950 Gross per 24 hour  Intake 790 ml  Output 403 ml  Net 387 ml   Filed Weights   10/12/17 1219 10/13/17 0500 10/15/17 0519  Weight: 108.9 kg (240 lb) 108 kg (238  lb 1.6 oz) 108.2 kg (238 lb 8 oz)   Exam  General: Well developed, well nourished, NAD, appears stated age  4: NCAT,mucous membranes moist.   Neck: Supple  Cardiovascular: S1 S2 auscultated, RRR, no murmur  Respiratory: Clear to auscultation bilaterally with equal chest rise  Abdomen: Soft, nontender, nondistended, + bowel sounds  Extremities: warm dry without cyanosis clubbing. RLE cellulitis improved, dressing in place on right foot  Neuro: AAOx3, nonfocal  Psych: Appropriate mood and affect  Data Reviewed: I have personally reviewed following labs and imaging studies  CBC: Recent Labs  Lab 10/12/17 1315 10/13/17 0041 10/14/17 0552 10/15/17 0621  WBC 23.5* 22.1* 19.5* 16.8*  NEUTROABS 20.7*  --   --   --   HGB 13.2 11.3* 11.6* 10.9*  HCT 39.7 35.4* 36.2* 34.6*  MCV 89.8 91.2 91.9 92.5  PLT 351 333 376 700   Basic Metabolic Panel: Recent Labs  Lab 10/12/17 1315 10/12/17 2107 10/13/17 0041 10/14/17 0552 10/15/17 0621  NA 129* 132* 130*  132* 134*  K 4.4 4.2 4.7 4.3 4.2  CL 94* 99* 97* 98* 99*  CO2 21* 21* 22 21* 25  GLUCOSE 266* 105* 178* 191* 180*  BUN 29* 23* 25* 25* 21*  CREATININE 1.17 1.04 1.11 1.00 0.92  CALCIUM 8.8* 8.3* 8.0* 8.2* 8.4*   GFR: Estimated Creatinine Clearance: 107.1 mL/min (by C-G formula based on SCr of 0.92 mg/dL). Liver Function Tests: Recent Labs  Lab 10/12/17 1315  AST 26  ALT 28  ALKPHOS 113  BILITOT 1.2  PROT 8.0  ALBUMIN 3.4*   No results for input(s): LIPASE, AMYLASE in the last 168 hours. No results for input(s): AMMONIA in the last 168 hours. Coagulation Profile: Recent Labs  Lab 10/12/17 2107  INR 1.28   Cardiac Enzymes: No results for input(s): CKTOTAL, CKMB, CKMBINDEX, TROPONINI in the last 168 hours. BNP (last 3 results) No results for input(s): PROBNP in the last 8760 hours. HbA1C: Recent Labs    10/12/17 2107  HGBA1C 8.7*   CBG: Recent Labs  Lab 10/14/17 0818 10/14/17 1251 10/14/17 1658  10/14/17 2115 10/15/17 0747  GLUCAP 157* 156* 192* 180* 174*   Lipid Profile: No results for input(s): CHOL, HDL, LDLCALC, TRIG, CHOLHDL, LDLDIRECT in the last 72 hours. Thyroid Function Tests: Recent Labs    10/13/17 0041  TSH 1.297   Anemia Panel: No results for input(s): VITAMINB12, FOLATE, FERRITIN, TIBC, IRON, RETICCTPCT in the last 72 hours. Urine analysis:    Component Value Date/Time   COLORURINE YELLOW 10/12/2017 [place] 10/12/2017 1348   LABSPEC <1.005 (L) 10/12/2017 1348   PHURINE 5.5 10/12/2017 1348   GLUCOSEU >=500 (A) 10/12/2017 1348   HGBUR NEGATIVE 10/12/2017 1348   BILIRUBINUR NEGATIVE 10/12/2017 1348   KETONESUR 15 (A) 10/12/2017 1348   PROTEINUR NEGATIVE 10/12/2017 1348   NITRITE NEGATIVE 10/12/2017 1348   LEUKOCYTESUR NEGATIVE 10/12/2017 1348   Sepsis Labs: _0 (procalcitonin:4,lacticidven:4)  ) Recent Results (from the past 240 hour(s))  Culture, blood (routine x 2)     Status: None (Preliminary result)   Collection Time: 10/12/17  1:14 PM  Result Value Ref Range Status   Specimen Description   Final    BLOOD RIGHT ANTECUBITAL Performed at [facility], [address] [place], [place] [address]    Special Requests   Final    BOTTLES DRAWN AEROBIC AND ANAEROBIC Blood Culture adequate volume Performed at [facility], [address] [place], [place] [address]    Culture   Final    NO GROWTH 2 DAYS Performed at [facility], [place] [address], [place], [place] [address]    Report Status PENDING  Incomplete  Culture, blood (routine x 2)     Status: None (Preliminary result)   Collection Time: 10/12/17  2:00 PM  Result Value Ref Range Status   Specimen Description   Final    BLOOD LEFT ANTECUBITAL Performed at [facility], [address] [place], [place] [address]    Special Requests   Final    BOTTLES DRAWN AEROBIC AND ANAEROBIC Blood Culture adequate volume Performed at [facility], [address] [place], [place] [address]    Culture   Final    NO GROWTH 2 DAYS Performed at [facility], [place] [address], [place], [place] [address]    Report Status PENDING  Incomplete  MRSA PCR Screening     Status: None   Collection  Time: 10/13/17  4:25 PM  Result Value Ref Range Status   MRSA by PCR NEGATIVE NEGATIVE Final    Comment:        The GeneXpert MRSA Assay (FDA approved for NASAL specimens only), is one component of a comprehensive MRSA colonization surveillance program. It is not intended to diagnose MRSA infection nor to guide or monitor treatment for MRSA infections. Performed at [facility], [place] [address] [place], [place] [address]   Surgical pcr screen     Status: None   Collection Time: 10/14/17  4:36 AM  Result Value Ref Range Status   MRSA, PCR NEGATIVE NEGATIVE Final   Staphylococcus aureus NEGATIVE NEGATIVE Final    Comment: (NOTE) The Xpert SA Assay (FDA approved for NASAL specimens in patients 77 years of age and older), is one component of a comprehensive surveillance program. It is not intended to diagnose infection nor to guide or monitor treatment. Performed at [facility], [place] [address] [place], [place] [address]   Aerobic/Anaerobic Culture (surgical/deep wound)     Status: None (Preliminary result)   Collection Time: 10/14/17  7:52 AM  Result Value Ref Range Status   Specimen Description ABSCESS RIGHT TOE GREAT  Final   Special Requests NONE  Final   Gram Stain   Final    RARE WBC PRESENT, PREDOMINANTLY PMN FEW RARE GRAM POSITIVE COCCI IN PAIRS    Culture   Final    CULTURE REINCUBATED FOR BETTER GROWTH Performed at [facility], [place] [address] [place], [place] [address]    Report Status PENDING  Incomplete      Radiology Studies: No results found.   Scheduled Meds: . aspirin  81 mg Oral [id number]  . atorvastatin  80 mg Oral Daily  . feeding supplement (PRO-STAT SUGAR FREE  64)  30 mL Oral BID  . heparin  5,000 Units Subcutaneous Q8H  . insulin aspart  0-5 Units Subcutaneous QHS  . insulin aspart  0-9 Units Subcutaneous TID WC  . living well with diabetes book   Does not apply Once  . loratadine  10 mg Oral Daily  . metoprolol succinate  25 mg Oral QPM  . sacubitril-valsartan  1 tablet Oral BID   Continuous Infusions: . cefTRIAXone (ROCEPHIN)  IV Stopped (10/14/17 2343)  . metronidazole Stopped (10/15/17 0549)  . vancomycin Stopped (10/15/17 0424)     LOS: 3 days   Time Spent in minutes   30 minutes  Rylan Bernard D.O. on 10/15/2017 at 11:18 AM  Between 7am to 7pm - Pager - [phone]  After 7pm go to [email or web address] - password [email or web address]  And look for the night coverage person covering for me after hours  [facility] Office  [phone]

## 2017-10-15 NOTE — Progress Notes (Signed)
Pharmacy Antibiotic Note  CLENNON NASCA is a 62 y.o. male admitted on 10/12/2017 with sepsis/cellulitis.  Pharmacy has been consulted for vancomycin dosing.  Vancomycin trough drawn today is 58mcg/mL. Renal function overall stable.  Patient went for toe amputation with Dr. Sharol Given yesterday- abscess over entire foot, plans for transtibial amputation on Wednesday.  Plan: Continue vancomycin 750 mg q12h as will likely accumulate *goal trough 15 -20 mcg/mL Ceftriaxone and Flagyl per MD  Height: 6' (182.9 cm) Weight: 238 lb 8 oz (108.2 kg) IBW/kg (Calculated) : 77.6  Temp (24hrs), Avg:99.4 F (37.4 C), Min:99.2 F (37.3 C), Max:99.5 F (37.5 C)  Recent Labs  Lab 10/12/17 1315 10/12/17 1319 10/12/17 1515 10/12/17 2107 10/13/17 0041 10/14/17 0552 10/15/17 0621 10/15/17 1435  WBC 23.5*  --   --   --  22.1* 19.5* 16.8*  --   CREATININE 1.17  --   --  1.04 1.11 1.00 0.92  --   LATICACIDVEN  --  2.43* 1.27 0.9 1.4  --   --   --   VANCOTROUGH  --   --   --   --   --   --   --  14*    Estimated Creatinine Clearance: 107.1 mL/min (by C-G formula based on SCr of 0.92 mg/dL).    Allergies  Allergen Reactions  . Sulfur Rash    Antimicrobials this admission: Zosyn 3/21 >> 3/21 Vancomycin 3/21 >>  Rocephin 3/21 >> Flagyl 3/21 >>  Dose adjustments this admission: 3/24 VT = 72mcg/mL- continue same dose, will likely accumulate  Microbiology results: 3/21 BCx: ngtd 3/22 MRSA: neg 3/23 abscess: few GPC > reincubated   Thank you for allowing pharmacy to be a part of this patient's care. Stephenia Vogan D. Emileigh Kellett, PharmD, BCPS Clinical Pharmacist Clinical Phone for 10/15/2017 until 3:30pm: [phone] If after 3:30pm, please call main pharmacy at [phone] 10/15/2017 3:40 PM

## 2017-10-16 DIAGNOSIS — I5022 Chronic systolic (congestive) heart failure: Secondary | ICD-10-CM

## 2017-10-16 DIAGNOSIS — A419 Sepsis, unspecified organism: Principal | ICD-10-CM

## 2017-10-16 DIAGNOSIS — E871 Hypo-osmolality and hyponatremia: Secondary | ICD-10-CM

## 2017-10-16 DIAGNOSIS — I10 Essential (primary) hypertension: Secondary | ICD-10-CM

## 2017-10-16 LAB — BASIC METABOLIC PANEL
ANION GAP: 9 (ref 5–15)
BUN: 14 mg/dL (ref 6–20)
CHLORIDE: 101 mmol/L (ref 101–111)
CO2: 25 mmol/L (ref 22–32)
Calcium: 8.3 mg/dL — ABNORMAL LOW (ref 8.9–10.3)
Creatinine, Ser: 0.75 mg/dL (ref 0.61–1.24)
GFR calc Af Amer: >60 mL/min (ref >60)
Glucose, Bld: 163 mg/dL — ABNORMAL HIGH (ref 65–99)
POTASSIUM: 4.5 mmol/L (ref 3.5–5.1)
SODIUM: 135 mmol/L (ref 135–145)

## 2017-10-16 LAB — CBC
HCT: 34.4 % — ABNORMAL LOW (ref 39.0–52.0)
Hemoglobin: 10.8 g/dL — ABNORMAL LOW (ref 13.0–17.0)
MCH: 29.2 pg (ref 26.0–34.0)
MCHC: 31.4 g/dL (ref 30.0–36.0)
MCV: 93 fL (ref 78.0–100.0)
Platelets: 399 10*3/uL (ref 150–400)
RBC: 3.7 MIL/uL — AB (ref 4.22–5.81)
RDW: 14.9 % (ref 11.5–15.5)
WBC: 13.7 10*3/uL — AB (ref 4.0–10.5)

## 2017-10-16 LAB — GLUCOSE, CAPILLARY
GLUCOSE-CAPILLARY: 177 mg/dL — AB (ref 65–99)
GLUCOSE-CAPILLARY: 212 mg/dL — AB (ref 65–99)
GLUCOSE-CAPILLARY: 198 mg/dL — AB (ref 65–99)
Glucose-Capillary: 186 mg/dL — ABNORMAL HIGH (ref 65–99)

## 2017-10-16 MED ORDER — SPIRONOLACTONE 25 MG PO TABS
25.0000 mg | ORAL_TABLET | Freq: Every evening | ORAL | Status: DC
  Administered 2017-10-16 – 2017-10-18 (×3): 25 mg via ORAL
  Filled 2017-10-16 (×3): qty 1

## 2017-10-16 NOTE — H&P (View-Only) (Signed)
Patient ID: Brian Herring, male   DOB: [birth year], 62 y.o.   MRN: [id number] Patient's foot infection was much more extensive than the first ray.  The infection extended up to the ankle.  The wound is currently packed open.  Will need to continue IV antibiotics and plan for a transtibial amputation on Wednesday.

## 2017-10-16 NOTE — Progress Notes (Signed)
Patient ID: Brian Herring, male   DOB: Feb 10, 1956, 62 y.o.   MRN: [id number] Patient's foot infection was much more extensive than the first ray.  The infection extended up to the ankle.  The wound is currently packed open.  Will need to continue IV antibiotics and plan for a transtibial amputation on Wednesday.

## 2017-10-16 NOTE — Progress Notes (Addendum)
Patient ID: Brian Herring, male   DOB: 11-19-1955, 62 y.o.   MRN: [id number]  PROGRESS NOTE    Brian Herring  VOZ:[id number] DOB: 1955-09-08 DOA: 10/12/2017 PCP: [name], MD   Brief Narrative:  62 year old male with history of hypertension, hyperlipidemia, diabetes mellitus, systolic CHF with EF of 42% presented with right foot ulcer with infection.  MRI showed osteomyelitis.  Orthopedics was consulted.  Patient was started on broad-spectrum antibiotics.   Assessment & Plan:   Principal Problem:   Diabetic foot infection ([place]) Active Problems:   Diabetic foot ulcer with osteomyelitis ([place])   Hyperlipidemia   HTN (hypertension), benign   Poorly controlled diabetes mellitus ([place])   Sepsis ([place])   Diabetic foot ulcer ([place])   Hyponatremia   Chronic systolic CHF (congestive heart failure) ([place])   Cellulitis of right foot   Sepsis secondary to right great toe osteomyelitis and cellulitis -Resolved.  Hemodynamically stable currently.  Antibiotic plan as below  Right foot osteomyelitis and cellulitis -Currently on vancomycin, Flagyl, Rocephin.  Wound culture grew moderate viridans Streptococcus.  Stop vancomycin -Continue pain control -MRI right foot showed soft tissue ulceration along the medial aspect of the great toe with extensive gas forming infection of the great toe and osteomyelitis of the first proximal and distal phalanges.  Large 6 cm abscess about the great toe extending proximally to the mid first metatarsal.  Infectious tenosynovitis of the extensor hallux longus tendon -ABI WNL B/L (right 1.09, Left 1.17). Right TBI could not be ascertained. 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 Sharol Given- patient will likely need transtibial amputation on Wednesday, continue IV antibiotics. Nonweightbearing on the right  Leukocytosis -Probably secondary to above.  Improving.  Repeat a.m.  labs  Diabetes mellitus, type II, poorly controlled -Continue insulin sliding scale and CBG monitoring -Home medications held -Hemoglobin A1c 8.7  Chronic systolic heart failure -Echocardiogram 09/02/2015: EF of 25-30% -Patient currently appears to be compensated and euvolemic -spironolactone held due to sepsis -Continue Entresto, metoprolol. -Blood pressure stable currently.  Consider restarting spironolactone.  Hyponatremia, chronic -Suspect secondary to diuretics and Entresto -After reviewing patient's chart, appears to be a chronic issue -Improved -Continue to monitor BMP  Essential hypertension -Continue metoprolol, Entresto.  Blood pressure stable. monitor.  Hyperlipidemia -Continue statin   DVT prophylaxis: Heparin Code Status: Full Family Communication: None at bedside Disposition Plan: Depends on clinical outcome  Consultants: Orthopedics  Procedures:  ABI Amputation of 1st and 2nd right ray on 10/14/2017   Antimicrobials:   Anti-infectives (From admission, onward)   Start     Dose/Rate Route Frequency Ordered Stop   10/14/17 0600  ceFAZolin (ANCEF) IVPB 2g/100 mL premix     2 g 200 mL/hr over 30 Minutes Intravenous On call to O.R. 10/14/17 0559 10/14/17 0738   10/13/17 0300  vancomycin (VANCOCIN) IVPB 750 mg/150 ml premix     750 mg 150 mL/hr over 60 Minutes Intravenous Every 12 hours 10/12/17 1435     10/12/17 2130  cefTRIAXone (ROCEPHIN) 2 g in sodium chloride 0.9 % 100 mL IVPB     2 g 200 mL/hr over 30 Minutes Intravenous Every 24 hours 10/12/17 2036     10/12/17 2045  metroNIDAZOLE (FLAGYL) IVPB 500 mg     500 mg 100 mL/hr over 60 Minutes Intravenous Every 8 hours 10/12/17 2036     10/12/17 2000  piperacillin-tazobactam (ZOSYN) IVPB 3.375 g  Status:  Discontinued  3.375 g 12.5 mL/hr over 240 Minutes Intravenous Every 8 hours 10/12/17 1435 10/12/17 2036   10/12/17 1545  vancomycin (VANCOCIN) IVPB 1000 mg/200 mL premix  Status:  Discontinued      1,000 mg 200 mL/hr over 60 Minutes Intravenous  Once 10/12/17 1442 10/12/17 2103   10/12/17 1445  vancomycin (VANCOCIN) IVPB 1000 mg/200 mL premix     1,000 mg 200 mL/hr over 60 Minutes Intravenous  Once 10/12/17 1442 10/12/17 1545   10/12/17 1430  vancomycin (VANCOCIN) 2,000 mg in sodium chloride 0.9 % 500 mL IVPB  Status:  Discontinued     2,000 mg 250 mL/hr over 120 Minutes Intravenous  Once 10/12/17 1425 10/12/17 1442   10/12/17 1400  vancomycin (VANCOCIN) IVPB 1000 mg/200 mL premix  Status:  Discontinued     1,000 mg 200 mL/hr over 60 Minutes Intravenous  Once 10/12/17 1348 10/12/17 1425   10/12/17 1345  piperacillin-tazobactam (ZOSYN) IVPB 3.375 g     3.375 g 100 mL/hr over 30 Minutes Intravenous  Once 10/12/17 1339 10/12/17 1441       Subjective: Patient seen and examined at bedside.  He denies any overnight fever, nausea or vomiting.  Objective: Vitals:   10/15/17 1552 10/15/17 2122 10/16/17 0530 10/16/17 1336  BP: 122/69 (!) 122/56 123/63 120/63  Pulse: (!) 101 94 88 85  Resp: 20 18 18 18   Temp: 98.4 F (36.9 C) 98.8 F (37.1 C) 98.9 F (37.2 C) 98.5 F (36.9 C)  TempSrc: Oral Oral Oral Oral  SpO2: 94% 95% 95% 94%  Weight:   108.4 kg (239 lb)   Height:        Intake/Output Summary (Last 24 hours) at 10/16/2017 1419 Last data filed at 10/16/2017 1415 Gross per 24 hour  Intake 810 ml  Output -  Net 810 ml   Filed Weights   10/13/17 0500 10/15/17 0519 10/16/17 0530  Weight: 108 kg (238 lb 1.6 oz) 108.2 kg (238 lb 8 oz) 108.4 kg (239 lb)    Examination:  General exam: Appears calm and comfortable  Respiratory system: Bilateral decreased breath sound at bases Cardiovascular system: S1 & S2 heard, rate controlled  gastrointestinal system: Abdomen is nondistended, soft and nontender. Normal bowel sounds heard. Extremities: No cyanosis, clubbing; right foot dressing present    Data Reviewed: I have personally reviewed following labs and imaging  studies  CBC: Recent Labs  Lab 10/12/17 1315 10/13/17 0041 10/14/17 0552 10/15/17 0621 10/16/17 0757  WBC 23.5* 22.1* 19.5* 16.8* 13.7*  NEUTROABS 20.7*  --   --   --   --   HGB 13.2 11.3* 11.6* 10.9* 10.8*  HCT 39.7 35.4* 36.2* 34.6* 34.4*  MCV 89.8 91.2 91.9 92.5 93.0  PLT 351 333 376 399 263   Basic Metabolic Panel: Recent Labs  Lab 10/12/17 2107 10/13/17 0041 10/14/17 0552 10/15/17 0621 10/16/17 0757  NA 132* 130* 132* 134* 135  K 4.2 4.7 4.3 4.2 4.5  CL 99* 97* 98* 99* 101  CO2 21* 22 21* 25 25  GLUCOSE 105* 178* 191* 180* 163*  BUN 23* 25* 25* 21* 14  CREATININE 1.04 1.11 1.00 0.92 0.75  CALCIUM 8.3* 8.0* 8.2* 8.4* 8.3*   GFR: Estimated Creatinine Clearance: 123.3 mL/min (by C-G formula based on SCr of 0.75 mg/dL). Liver Function Tests: Recent Labs  Lab 10/12/17 1315  AST 26  ALT 28  ALKPHOS 113  BILITOT 1.2  PROT 8.0  ALBUMIN 3.4*   No results for input(s): LIPASE, AMYLASE  in the last 168 hours. No results for input(s): AMMONIA in the last 168 hours. Coagulation Profile: Recent Labs  Lab 10/12/17 2107  INR 1.28   Cardiac Enzymes: No results for input(s): CKTOTAL, CKMB, CKMBINDEX, TROPONINI in the last 168 hours. BNP (last 3 results) No results for input(s): PROBNP in the last 8760 hours. HbA1C: No results for input(s): HGBA1C in the last 72 hours. CBG: Recent Labs  Lab 10/15/17 1240 10/15/17 1711 10/15/17 2120 10/16/17 0802 10/16/17 1208  GLUCAP 156* 202* 195* 177* 186*   Lipid Profile: No results for input(s): CHOL, HDL, LDLCALC, TRIG, CHOLHDL, LDLDIRECT in the last 72 hours. Thyroid Function Tests: No results for input(s): TSH, T4TOTAL, FREET4, T3FREE, THYROIDAB in the last 72 hours. Anemia Panel: No results for input(s): VITAMINB12, FOLATE, FERRITIN, TIBC, IRON, RETICCTPCT in the last 72 hours. Sepsis Labs: Recent Labs  Lab 10/12/17 1319 10/12/17 1515 10/12/17 2107 10/13/17 0041  PROCALCITON  --   --  0.35  --    LATICACIDVEN 2.43* 1.27 0.9 1.4    Recent Results (from the past 240 hour(s))  Culture, blood (routine x 2)     Status: None (Preliminary result)   Collection Time: 10/12/17  1:14 PM  Result Value Ref Range Status   Specimen Description   Final    BLOOD RIGHT ANTECUBITAL Performed at [facility], [address] [place], [place] [address]    Special Requests   Final    BOTTLES DRAWN AEROBIC AND ANAEROBIC Blood Culture adequate volume Performed at [facility], [address] [place], [place] [address]    Culture   Final    NO GROWTH 4 DAYS Performed at [facility], [place] [address] [place], [place] [address]    Report Status PENDING  Incomplete  Culture, blood (routine x 2)     Status: None (Preliminary result)   Collection Time: 10/12/17  2:00 PM  Result Value Ref Range Status   Specimen Description   Final    BLOOD LEFT ANTECUBITAL Performed at [facility], [address] [place], [place] [address]    Special Requests   Final    BOTTLES DRAWN AEROBIC AND ANAEROBIC Blood Culture adequate volume Performed at [facility], [address] [place], [place] [address]    Culture   Final    NO GROWTH 4 DAYS Performed at [facility], [place] [address], [place], [place] [address]    Report Status PENDING  Incomplete  MRSA PCR Screening     Status: None   Collection Time: 10/13/17  4:25 PM  Result Value Ref Range Status   MRSA by PCR NEGATIVE NEGATIVE Final    Comment:        The GeneXpert MRSA Assay (FDA approved for NASAL specimens only), is one component of a comprehensive MRSA colonization surveillance program. It is not intended to diagnose MRSA infection nor to guide or monitor treatment for MRSA infections. Performed at [facility], [place] [address] [place], [place] [address]   Surgical pcr screen     Status: None   Collection Time: 10/14/17  4:36 AM  Result Value Ref Range Status   MRSA, PCR  NEGATIVE NEGATIVE Final   Staphylococcus aureus NEGATIVE NEGATIVE Final    Comment: (NOTE) The Xpert SA Assay (FDA approved for NASAL specimens in patients 16 years of age and older), is one component of a comprehensive surveillance program. It is not  intended to diagnose infection nor to guide or monitor treatment. Performed at [facility], [place] [address] [place], [place] [address]   Aerobic/Anaerobic Culture (surgical/deep wound)     Status: None (Preliminary result)   Collection Time: 10/14/17  7:52 AM  Result Value Ref Range Status   Specimen Description ABSCESS RIGHT TOE GREAT  Final   Special Requests NONE  Final   Gram Stain   Final    RARE WBC PRESENT, PREDOMINANTLY PMN FEW RARE GRAM POSITIVE COCCI IN PAIRS    Culture   Final    MODERATE VIRIDANS STREPTOCOCCUS HOLDING FOR POSSIBLE ANAEROBE Performed at [facility], [address] [place], [place] [address]    Report Status PENDING  Incomplete         Radiology Studies: No results found.      Scheduled Meds: . aspirin  81 mg Oral BH-q7a  . atorvastatin  80 mg Oral Daily  . feeding supplement (PRO-STAT SUGAR FREE 64)  30 mL Oral BID  . heparin  5,000 Units Subcutaneous Q8H  . insulin aspart  0-5 Units Subcutaneous QHS  . insulin aspart  0-9 Units Subcutaneous TID WC  . living well with diabetes book   Does not apply Once  . loratadine  10 mg Oral Daily  . metoprolol succinate  25 mg Oral QPM  . saccharomyces boulardii  250 mg Oral BID  . sacubitril-valsartan  1 tablet Oral BID   Continuous Infusions: . cefTRIAXone (ROCEPHIN)  IV Stopped (10/16/17 0127)  . metronidazole Stopped (10/16/17 1339)  . vancomycin Stopped (10/16/17 0414)     LOS: 4 days        Aline August, MD [facility] Pager [phone]  If 7PM-7AM, please contact night-coverage [email or web address] Password [facility] 10/16/2017, 2:19 PM

## 2017-10-17 ENCOUNTER — Other Ambulatory Visit (INDEPENDENT_AMBULATORY_CARE_PROVIDER_SITE_OTHER): Payer: Self-pay | Admitting: Orthopedic Surgery

## 2017-10-17 DIAGNOSIS — E1165 Type 2 diabetes mellitus with hyperglycemia: Secondary | ICD-10-CM

## 2017-10-17 DIAGNOSIS — M86271 Subacute osteomyelitis, right ankle and foot: Secondary | ICD-10-CM

## 2017-10-17 DIAGNOSIS — E785 Hyperlipidemia, unspecified: Secondary | ICD-10-CM

## 2017-10-17 LAB — BASIC METABOLIC PANEL
Anion gap: 10 (ref 5–15)
BUN: 14 mg/dL (ref 6–20)
CO2: 24 mmol/L (ref 22–32)
CREATININE: 0.71 mg/dL (ref 0.61–1.24)
Calcium: 8.4 mg/dL — ABNORMAL LOW (ref 8.9–10.3)
Chloride: 102 mmol/L (ref 101–111)
GFR calc Af Amer: >60 mL/min (ref >60)
GFR calc non Af Amer: >60 mL/min (ref >60)
Glucose, Bld: 185 mg/dL — ABNORMAL HIGH (ref 65–99)
Potassium: 4.2 mmol/L (ref 3.5–5.1)
SODIUM: 136 mmol/L (ref 135–145)

## 2017-10-17 LAB — CBC WITH DIFFERENTIAL/PLATELET
Basophils Absolute: 0 10*3/uL (ref 0.0–0.1)
Basophils Relative: 0 %
EOS ABS: 0.1 10*3/uL (ref 0.0–0.7)
EOS PCT: 1 %
HCT: 36.6 % — ABNORMAL LOW (ref 39.0–52.0)
Hemoglobin: 11.4 g/dL — ABNORMAL LOW (ref 13.0–17.0)
Lymphocytes Relative: 18 %
Lymphs Abs: 2.6 10*3/uL (ref 0.7–4.0)
MCH: 28.5 pg (ref 26.0–34.0)
MCHC: 31.1 g/dL (ref 30.0–36.0)
MCV: 91.5 fL (ref 78.0–100.0)
MONOS PCT: 7 %
Monocytes Absolute: 1.1 10*3/uL — ABNORMAL HIGH (ref 0.1–1.0)
Neutro Abs: 11.1 10*3/uL — ABNORMAL HIGH (ref 1.7–7.7)
Neutrophils Relative %: 74 %
PLATELETS: 430 10*3/uL — AB (ref 150–400)
RBC: 4 MIL/uL — ABNORMAL LOW (ref 4.22–5.81)
RDW: 14.4 % (ref 11.5–15.5)
WBC: 14.9 10*3/uL — ABNORMAL HIGH (ref 4.0–10.5)

## 2017-10-17 LAB — GLUCOSE, CAPILLARY
GLUCOSE-CAPILLARY: 221 mg/dL — AB (ref 65–99)
Glucose-Capillary: 186 mg/dL — ABNORMAL HIGH (ref 65–99)
Glucose-Capillary: 197 mg/dL — ABNORMAL HIGH (ref 65–99)
Glucose-Capillary: 314 mg/dL — ABNORMAL HIGH (ref 65–99)

## 2017-10-17 LAB — CULTURE, BLOOD (ROUTINE X 2)
Culture: NO GROWTH
Culture: NO GROWTH
SPECIAL REQUESTS: ADEQUATE
SPECIAL REQUESTS: ADEQUATE

## 2017-10-17 LAB — HEMOGLOBIN AND HEMATOCRIT, BLOOD
HCT: 34.8 % — ABNORMAL LOW (ref 39.0–52.0)
Hemoglobin: 11 g/dL — ABNORMAL LOW (ref 13.0–17.0)

## 2017-10-17 LAB — MAGNESIUM: Magnesium: 2.1 mg/dL (ref 1.7–2.4)

## 2017-10-17 MED ORDER — CEFAZOLIN SODIUM-DEXTROSE 2-4 GM/100ML-% IV SOLN
2.0000 g | INTRAVENOUS | Status: AC
  Administered 2017-10-18: 2 g via INTRAVENOUS
  Filled 2017-10-17: qty 100

## 2017-10-17 MED ORDER — CHLORHEXIDINE GLUCONATE 4 % EX LIQD
60.0000 mL | Freq: Once | CUTANEOUS | Status: AC
  Administered 2017-10-18: 4 via TOPICAL
  Filled 2017-10-17: qty 60

## 2017-10-17 MED ORDER — CEFAZOLIN SODIUM-DEXTROSE 2-4 GM/100ML-% IV SOLN: 2.0000 g | INTRAVENOUS | Status: DC

## 2017-10-17 MED ORDER — POVIDONE-IODINE 10 % EX SWAB
2.0000 "application " | Freq: Once | CUTANEOUS | Status: AC
  Administered 2017-10-18: 2 via TOPICAL

## 2017-10-17 MED ORDER — CHLORHEXIDINE GLUCONATE 4 % EX LIQD
60.0000 mL | Freq: Once | CUTANEOUS | Status: AC
  Filled 2017-10-17: qty 60

## 2017-10-17 NOTE — Progress Notes (Addendum)
Patient ID: Brian Herring, male   DOB: 1955/11/21, 62 y.o.   MRN: [id number]  PROGRESS NOTE    SHANT HENCE  [id number] DOB: 07-29-1955 DOA: 10/12/2017 PCP: [name], MD   Brief Narrative:  62 year old male with history of hypertension, hyperlipidemia, diabetes mellitus, systolic CHF with EF of 57% presented with right foot ulcer with infection.  MRI showed osteomyelitis.  Orthopedics was consulted.  Patient was started on broad-spectrum antibiotics.   Assessment & Plan:   Principal Problem:   Diabetic foot infection ([place]) Active Problems:   Diabetic foot ulcer with osteomyelitis ([place])   Hyperlipidemia   HTN (hypertension), benign   Poorly controlled diabetes mellitus ([place])   Sepsis ([place])   Diabetic foot ulcer ([place])   Hyponatremia   Chronic systolic CHF (congestive heart failure) ([place])   Cellulitis of right foot   Sepsis secondary to right great toe osteomyelitis and cellulitis -Resolved.  Hemodynamically stable currently.  Antibiotic plan as below  Right foot osteomyelitis and cellulitis -Currently on Flagyl and Rocephin.  Wound culture grew moderate viridans Streptococcus.  Stopped vancomycin on 10/16/2017 -Continue pain control -MRI right foot showed soft tissue ulceration along the medial aspect of the great toe with extensive gas forming infection of the great toe and osteomyelitis of the first proximal and distal phalanges.  Large 6 cm abscess about the great toe extending proximally to the mid first metatarsal.  Infectious tenosynovitis of the extensor hallux longus tendon -ABI WNL B/L (right 1.09, Left 1.17). Right TBI could not be ascertained. 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 Sharol Given- patient will likely need transtibial amputation on 10/18/2017, continue IV antibiotics. Nonweightbearing on the right.  Will make n.p.o. past midnight for tomorrow.  Leukocytosis -Probably  secondary to above. Repeat a.m. labs  Diabetes mellitus, type II, poorly controlled -Continue insulin sliding scale and CBG monitoring -Home medications held -Hemoglobin A1c 8.7  Chronic systolic heart failure -Echocardiogram 09/02/2015: EF of 25-30% -Patient currently appears to be compensated and euvolemic -spironolactone held due to sepsis initially. -Continue Entresto, metoprolol. -Blood pressure stable currently.  Spironolactone was restarted on 10/16/2017.  Hyponatremia, chronic -Suspect secondary to diuretics and Entresto -After reviewing patient's chart, appears to be a chronic issue -Improved -Continue to monitor BMP  Essential hypertension -Continue metoprolol, Entresto and spironolactone.  Blood pressure stable. monitor.  Hyperlipidemia -Continue statin  Mild blood in stools -Hemoglobin stable.  Patient has an outpatient schedule with [facility] for colonoscopy.  If persistent bloody stool, will call GI consult.  Monitor H&H.  Hold heparin and aspirin for now   DVT prophylaxis: Heparin will be held Code Status: Full Family Communication: None at bedside Disposition Plan: Depends on clinical outcome  Consultants: Orthopedics  Procedures:  ABI Amputation of 1st and 2nd right ray on 10/14/2017   Antimicrobials:   Anti-infectives (From admission, onward)   Start     Dose/Rate Route Frequency Ordered Stop   10/14/17 0600  ceFAZolin (ANCEF) IVPB 2g/100 mL premix     2 g 200 mL/hr over 30 Minutes Intravenous On call to O.R. 10/14/17 0559 10/14/17 0738   10/13/17 0300  vancomycin (VANCOCIN) IVPB 750 mg/150 ml premix  Status:  Discontinued     750 mg 150 mL/hr over 60 Minutes Intravenous Every 12 hours 10/12/17 1435 10/16/17 1436   10/12/17 2130  cefTRIAXone (ROCEPHIN) 2 g in sodium chloride 0.9 % 100 mL IVPB     2 g  200 mL/hr over 30 Minutes Intravenous Every 24 hours 10/12/17 2036     10/12/17 2045  metroNIDAZOLE (FLAGYL) IVPB 500 mg     500 mg 100 mL/hr  over 60 Minutes Intravenous Every 8 hours 10/12/17 2036     10/12/17 2000  piperacillin-tazobactam (ZOSYN) IVPB 3.375 g  Status:  Discontinued     3.375 g 12.5 mL/hr over 240 Minutes Intravenous Every 8 hours 10/12/17 1435 10/12/17 2036   10/12/17 1545  vancomycin (VANCOCIN) IVPB 1000 mg/200 mL premix  Status:  Discontinued     1,000 mg 200 mL/hr over 60 Minutes Intravenous  Once 10/12/17 1442 10/12/17 2103   10/12/17 1445  vancomycin (VANCOCIN) IVPB 1000 mg/200 mL premix     1,000 mg 200 mL/hr over 60 Minutes Intravenous  Once 10/12/17 1442 10/12/17 1545   10/12/17 1430  vancomycin (VANCOCIN) 2,000 mg in sodium chloride 0.9 % 500 mL IVPB  Status:  Discontinued     2,000 mg 250 mL/hr over 120 Minutes Intravenous  Once 10/12/17 1425 10/12/17 1442   10/12/17 1400  vancomycin (VANCOCIN) IVPB 1000 mg/200 mL premix  Status:  Discontinued     1,000 mg 200 mL/hr over 60 Minutes Intravenous  Once 10/12/17 1348 10/12/17 1425   10/12/17 1345  piperacillin-tazobactam (ZOSYN) IVPB 3.375 g     3.375 g 100 mL/hr over 30 Minutes Intravenous  Once 10/12/17 1339 10/12/17 1441        Subjective: Patient seen and examined at bedside.  He states that he had some blood in stools early this morning.  No overnight fever, nausea or vomiting.  No abdominal pain. Objective: Vitals:   10/16/17 0530 10/16/17 1336 10/16/17 2202 10/17/17 0456  BP: 123/63 120/63 124/66 128/81  Pulse: 88 85 90 89  Resp: 18 18 20 16   Temp: 98.9 F (37.2 C) 98.5 F (36.9 C) (!) 97.3 F (36.3 C) 97.6 F (36.4 C)  TempSrc: Oral Oral Oral Oral  SpO2: 95% 94% 95% 96%  Weight: 108.4 kg (239 lb)   109.1 kg (240 lb 8.4 oz)  Height:        Intake/Output Summary (Last 24 hours) at 10/17/2017 0926 Last data filed at 10/16/2017 1415 Gross per 24 hour  Intake 750 ml  Output -  Net 750 ml   Filed Weights   10/15/17 0519 10/16/17 0530 10/17/17 0456  Weight: 108.2 kg (238 lb 8 oz) 108.4 kg (239 lb) 109.1 kg (240 lb 8.4 oz)     Examination:  General exam: Appears calm and comfortable.  No acute distress Respiratory system: Bilateral decreased breath sounds at bases Cardiovascular system: S2 heard, rate controlled  gastrointestinal system: Abdomen is nondistended, soft and nontender. Normal bowel sounds heard. Extremities: No cyanosis, clubbing; right foot dressing present   Data Reviewed: I have personally reviewed following labs and imaging studies  CBC: Recent Labs  Lab 10/12/17 1315 10/13/17 0041 10/14/17 0552 10/15/17 0621 10/16/17 0757 10/17/17 0628  WBC 23.5* 22.1* 19.5* 16.8* 13.7* 14.9*  NEUTROABS 20.7*  --   --   --   --  11.1*  HGB 13.2 11.3* 11.6* 10.9* 10.8* 11.4*  HCT 39.7 35.4* 36.2* 34.6* 34.4* 36.6*  MCV 89.8 91.2 91.9 92.5 93.0 91.5  PLT 351 333 376 399 399 237*   Basic Metabolic Panel: Recent Labs  Lab 10/13/17 0041 10/14/17 0552 10/15/17 0621 10/16/17 0757 10/17/17 0628  NA 130* 132* 134* 135 136  K 4.7 4.3 4.2 4.5 4.2  CL 97* 98* 99* 101 102  CO2 22 21* 25 25 24   GLUCOSE 178* 191* 180* 163* 185*  BUN 25* 25* 21* 14 14  CREATININE 1.11 1.00 0.92 0.75 0.71  CALCIUM 8.0* 8.2* 8.4* 8.3* 8.4*  MG  --   --   --   --  2.1   GFR: Estimated Creatinine Clearance: 123.7 mL/min (by C-G formula based on SCr of 0.71 mg/dL). Liver Function Tests: Recent Labs  Lab 10/12/17 1315  AST 26  ALT 28  ALKPHOS 113  BILITOT 1.2  PROT 8.0  ALBUMIN 3.4*   No results for input(s): LIPASE, AMYLASE in the last 168 hours. No results for input(s): AMMONIA in the last 168 hours. Coagulation Profile: Recent Labs  Lab 10/12/17 2107  INR 1.28   Cardiac Enzymes: No results for input(s): CKTOTAL, CKMB, CKMBINDEX, TROPONINI in the last 168 hours. BNP (last 3 results) No results for input(s): PROBNP in the last 8760 hours. HbA1C: No results for input(s): HGBA1C in the last 72 hours. CBG: Recent Labs  Lab 10/16/17 0802 10/16/17 1208 10/16/17 1646 10/16/17 2200 10/17/17 0818   GLUCAP 177* 186* 212* 198* 186*   Lipid Profile: No results for input(s): CHOL, HDL, LDLCALC, TRIG, CHOLHDL, LDLDIRECT in the last 72 hours. Thyroid Function Tests: No results for input(s): TSH, T4TOTAL, FREET4, T3FREE, THYROIDAB in the last 72 hours. Anemia Panel: No results for input(s): VITAMINB12, FOLATE, FERRITIN, TIBC, IRON, RETICCTPCT in the last 72 hours. Sepsis Labs: Recent Labs  Lab 10/12/17 1319 10/12/17 1515 10/12/17 2107 10/13/17 0041  PROCALCITON  --   --  0.35  --   LATICACIDVEN 2.43* 1.27 0.9 1.4    Recent Results (from the past 240 hour(s))  Culture, blood (routine x 2)     Status: None (Preliminary result)   Collection Time: 10/12/17  1:14 PM  Result Value Ref Range Status   Specimen Description   Final    BLOOD RIGHT ANTECUBITAL Performed at [facility], [address] [place], [place] [address]    Special Requests   Final    BOTTLES DRAWN AEROBIC AND ANAEROBIC Blood Culture adequate volume Performed at [facility], [address] [place], [place] [address]    Culture   Final    NO GROWTH 4 DAYS Performed at [facility], [place] [address], [place], [place] [address]    Report Status PENDING  Incomplete  Culture, blood (routine x 2)     Status: None (Preliminary result)   Collection Time: 10/12/17  2:00 PM  Result Value Ref Range Status   Specimen Description   Final    BLOOD LEFT ANTECUBITAL Performed at [facility], [address] [place], [place] [address]    Special Requests   Final    BOTTLES DRAWN AEROBIC AND ANAEROBIC Blood Culture adequate volume Performed at [facility], [address] [place], [place] [address]    Culture   Final    NO GROWTH 4 DAYS Performed at [facility], [place] [address], [place], [place] [address]    Report Status PENDING  Incomplete  MRSA PCR Screening     Status: None   Collection Time: 10/13/17  4:25 PM  Result Value Ref Range Status   MRSA by PCR  NEGATIVE NEGATIVE Final    Comment:        The GeneXpert MRSA Assay (FDA approved for NASAL specimens only), is one component of a comprehensive MRSA colonization surveillance program. It  is not intended to diagnose MRSA infection nor to guide or monitor treatment for MRSA infections. Performed at [facility], [place] [address], [place], [place] [address]   Surgical pcr screen     Status: None   Collection Time: 10/14/17  4:36 AM  Result Value Ref Range Status   MRSA, PCR NEGATIVE NEGATIVE Final   Staphylococcus aureus NEGATIVE NEGATIVE Final    Comment: (NOTE) The Xpert SA Assay (FDA approved for NASAL specimens in patients 50 years of age and older), is one component of a comprehensive surveillance program. It is not intended to diagnose infection nor to guide or monitor treatment. Performed at [facility], [place] [address] [place], [place] [address]   Aerobic/Anaerobic Culture (surgical/deep wound)     Status: None (Preliminary result)   Collection Time: 10/14/17  7:52 AM  Result Value Ref Range Status   Specimen Description ABSCESS RIGHT TOE GREAT  Final   Special Requests NONE  Final   Gram Stain   Final    RARE WBC PRESENT, PREDOMINANTLY PMN FEW RARE GRAM POSITIVE COCCI IN PAIRS    Culture   Final    MODERATE VIRIDANS STREPTOCOCCUS HOLDING FOR POSSIBLE ANAEROBE Performed at [facility], [address] [place], [place] [address]    Report Status PENDING  Incomplete         Radiology Studies: No results found.      Scheduled Meds: . aspirin  81 mg Oral BH-q7a  . atorvastatin  80 mg Oral Daily  . feeding supplement (PRO-STAT SUGAR FREE 64)  30 mL Oral BID  . heparin  5,000 Units Subcutaneous Q8H  . insulin aspart  0-5 Units Subcutaneous QHS  . insulin aspart  0-9 Units Subcutaneous TID WC  . living well with diabetes book   Does not apply Once  . loratadine  10 mg Oral Daily  . metoprolol succinate  25 mg Oral QPM  . saccharomyces  boulardii  250 mg Oral BID  . sacubitril-valsartan  1 tablet Oral BID  . spironolactone  25 mg Oral QPM   Continuous Infusions: . cefTRIAXone (ROCEPHIN)  IV Stopped (10/16/17 2208)  . metronidazole Stopped (10/17/17 7619)     LOS: 5 days        Aline August, MD [facility] Pager [phone]  If 7PM-7AM, please contact night-coverage [email or web address] Password [facility] 10/17/2017, 9:26 AM

## 2017-10-17 NOTE — Progress Notes (Signed)
Nutrition Follow-up  DOCUMENTATION CODES:   Obesity unspecified  INTERVENTION:  Continue Pro-stat 10m BID Each supplement provides 100 calories and 15 grams of proten  NUTRITION DIAGNOSIS:   Increased nutrient needs related to wound healing as evidenced by estimated needs. -ongoing  GOAL:   Patient will meet greater than or equal to 90% of their needs -met  MONITOR:   PO intake, Supplement acceptance, Labs, Skin, Weight trends  ASSESSMENT:   62y.o. Male with PMH significant of hypertension, hyperlipidemia, diabetes mellitus, sCHF with EF 25%, who presents with right foot ulcer with infection.  Eating well. Had 100% of Eggs, sausage, bacon, grits and orange juice. No complaints. Transtibial amputation to take place 3/27 for R foot infection. RD will monitor for needs.  Labs reviewed:  CBGs 186-197  Medications reviewed and include:  Insulin  Diet Order:  Diet Carb Modified Fluid consistency: Thin; Room service appropriate? Yes Diet NPO time specified Except for: Sips with Meds Diet NPO time specified  EDUCATION NEEDS:   Not appropriate for education at this time  Skin:  Skin Assessment: Skin Integrity Issues: Skin Integrity Issues:: Diabetic Ulcer Diabetic Ulcer: toe  Last BM:  3/21  Height:   Ht Readings from Last 1 Encounters:  10/12/17 6' (1.829 m)    Weight:   Wt Readings from Last 1 Encounters:  10/17/17 240 lb 8.4 oz (109.1 kg)    Ideal Body Weight:  81 kg  BMI:  Body mass index is 32.62 kg/m.  Estimated Nutritional Needs:   Kcal:  2100-2300  Protein:  105-120 gm  Fluid:  2.1-2.3 L  WSatira Anis Pascuala Klutts, MS, RD LDN Inpatient Clinical Dietitian Pager [phone]

## 2017-10-17 NOTE — Progress Notes (Signed)
Nurse tech informed RN that pt had a large bloody stool. [facility] paged.

## 2017-10-17 NOTE — Progress Notes (Signed)
Spoke with dr. Starla Link regarding pt's blood in stool.  Per doctor will order H&H and hold asprin and heparin.  Pt also to follow up with gastroenterology outpt for colonoscopy.  Will monitor any subsequent bowel movements.

## 2017-10-17 NOTE — Plan of Care (Signed)
  Problem: Activity: Goal: Risk for activity intolerance will decrease 10/17/2017 1459 by Agnes Lawrence, RN Outcome: Progressing Note:  Patient able to transfer to bedside commode and chair with stand by assistance.  Pt able to show proper pivot technique and able to maintain home ADL's 10/17/2017 1458 by Agnes Lawrence, RN Outcome: Progressing

## 2017-10-17 NOTE — Progress Notes (Signed)
Pt had second bloody stool. Brown/red in color, RN able to assess stool. Large amount, triad paged.

## 2017-10-18 ENCOUNTER — Encounter (HOSPITAL_COMMUNITY): Payer: Self-pay | Admitting: *Deleted

## 2017-10-18 ENCOUNTER — Inpatient Hospital Stay (HOSPITAL_COMMUNITY): Payer: BLUE CROSS/BLUE SHIELD | Admitting: Certified Registered"

## 2017-10-18 ENCOUNTER — Encounter (HOSPITAL_COMMUNITY)
Admission: EM | Disposition: A | Discharge status: Rehabilitation Facility | Payer: Self-pay | Source: Home / Self Care | Attending: Internal Medicine

## 2017-10-18 HISTORY — PX: AMPUTATION: SHX166

## 2017-10-18 LAB — CBC WITH DIFFERENTIAL/PLATELET
BASOS ABS: 0.1 10*3/uL (ref 0.0–0.1)
BASOS PCT: 0 %
EOS ABS: 0.1 10*3/uL (ref 0.0–0.7)
Eosinophils Relative: 1 %
HCT: 37.7 % — ABNORMAL LOW (ref 39.0–52.0)
Hemoglobin: 12.2 g/dL — ABNORMAL LOW (ref 13.0–17.0)
Lymphocytes Relative: 20 %
Lymphs Abs: 2.8 10*3/uL (ref 0.7–4.0)
MCH: 29.9 pg (ref 26.0–34.0)
MCHC: 32.4 g/dL (ref 30.0–36.0)
MCV: 92.4 fL (ref 78.0–100.0)
MONO ABS: 0.7 10*3/uL (ref 0.1–1.0)
Monocytes Relative: 5 %
NEUTROS ABS: 10.2 10*3/uL — AB (ref 1.7–7.7)
NEUTROS PCT: 74 %
Platelets: 430 10*3/uL — ABNORMAL HIGH (ref 150–400)
RBC: 4.08 MIL/uL — ABNORMAL LOW (ref 4.22–5.81)
RDW: 14.5 % (ref 11.5–15.5)
WBC: 13.9 10*3/uL — ABNORMAL HIGH (ref 4.0–10.5)

## 2017-10-18 LAB — BASIC METABOLIC PANEL
ANION GAP: 9 (ref 5–15)
BUN: 9 mg/dL (ref 6–20)
CALCIUM: 8.5 mg/dL — AB (ref 8.9–10.3)
CO2: 24 mmol/L (ref 22–32)
Chloride: 101 mmol/L (ref 101–111)
Creatinine, Ser: 0.64 mg/dL (ref 0.61–1.24)
Glucose, Bld: 173 mg/dL — ABNORMAL HIGH (ref 65–99)
Potassium: 4.3 mmol/L (ref 3.5–5.1)
SODIUM: 134 mmol/L — AB (ref 135–145)

## 2017-10-18 LAB — GLUCOSE, CAPILLARY
GLUCOSE-CAPILLARY: 174 mg/dL — AB (ref 65–99)
GLUCOSE-CAPILLARY: 143 mg/dL — AB (ref 65–99)
GLUCOSE-CAPILLARY: 210 mg/dL — AB (ref 65–99)
Glucose-Capillary: 137 mg/dL — ABNORMAL HIGH (ref 65–99)

## 2017-10-18 LAB — MAGNESIUM: MAGNESIUM: 1.8 mg/dL (ref 1.7–2.4)

## 2017-10-18 SURGERY — AMPUTATION BELOW KNEE
Anesthesia: General | Laterality: Right

## 2017-10-18 MED ORDER — DOCUSATE SODIUM 100 MG PO CAPS
100.0000 mg | ORAL_CAPSULE | Freq: Two times a day (BID) | ORAL | Status: DC
  Administered 2017-10-19 (×2): 100 mg via ORAL
  Filled 2017-10-18 (×4): qty 1

## 2017-10-18 MED ORDER — METOCLOPRAMIDE HCL 5 MG/ML IJ SOLN: 5.0000 mg | Freq: Three times a day (TID) | INTRAMUSCULAR | Status: DC | PRN

## 2017-10-18 MED ORDER — LIDOCAINE 2% (20 MG/ML) 5 ML SYRINGE
INTRAMUSCULAR | Status: DC | PRN
  Administered 2017-10-18: 80 mg via INTRAVENOUS

## 2017-10-18 MED ORDER — LIDOCAINE HCL (CARDIAC) 20 MG/ML IV SOLN
INTRAVENOUS | Status: AC
  Filled 2017-10-18: qty 5

## 2017-10-18 MED ORDER — PHENYLEPHRINE 40 MCG/ML (10ML) SYRINGE FOR IV PUSH (FOR BLOOD PRESSURE SUPPORT)
PREFILLED_SYRINGE | INTRAVENOUS | Status: DC | PRN
  Administered 2017-10-18 (×4): 80 ug via INTRAVENOUS

## 2017-10-18 MED ORDER — MEPERIDINE HCL 50 MG/ML IJ SOLN: 6.2500 mg | INTRAMUSCULAR | Status: DC | PRN

## 2017-10-18 MED ORDER — DEXAMETHASONE SODIUM PHOSPHATE 4 MG/ML IJ SOLN
INTRAMUSCULAR | Status: DC | PRN
  Administered 2017-10-18: 5 mg via INTRAVENOUS

## 2017-10-18 MED ORDER — SODIUM CHLORIDE 0.9 % IV SOLN
INTRAVENOUS | Status: DC
  Administered 2017-10-18: 16:00:00 via INTRAVENOUS

## 2017-10-18 MED ORDER — METHOCARBAMOL 500 MG PO TABS: 500.0000 mg | ORAL_TABLET | Freq: Four times a day (QID) | ORAL | Status: DC | PRN

## 2017-10-18 MED ORDER — PROPOFOL 10 MG/ML IV BOLUS
INTRAVENOUS | Status: AC
  Filled 2017-10-18: qty 20

## 2017-10-18 MED ORDER — OXYCODONE HCL 5 MG PO TABS
5.0000 mg | ORAL_TABLET | ORAL | Status: DC | PRN
  Administered 2017-10-19 – 2017-10-20 (×2): 10 mg via ORAL
  Filled 2017-10-18 (×2): qty 2

## 2017-10-18 MED ORDER — FENTANYL CITRATE (PF) 100 MCG/2ML IJ SOLN
INTRAMUSCULAR | Status: DC | PRN
  Administered 2017-10-18 (×4): 50 ug via INTRAVENOUS

## 2017-10-18 MED ORDER — MIDAZOLAM HCL 2 MG/2ML IJ SOLN
INTRAMUSCULAR | Status: AC
  Filled 2017-10-18: qty 2

## 2017-10-18 MED ORDER — PHENYLEPHRINE 40 MCG/ML (10ML) SYRINGE FOR IV PUSH (FOR BLOOD PRESSURE SUPPORT)
PREFILLED_SYRINGE | INTRAVENOUS | Status: AC
  Filled 2017-10-18: qty 10

## 2017-10-18 MED ORDER — FENTANYL CITRATE (PF) 250 MCG/5ML IJ SOLN
INTRAMUSCULAR | Status: AC
  Filled 2017-10-18: qty 5

## 2017-10-18 MED ORDER — ONDANSETRON HCL 4 MG/2ML IJ SOLN
INTRAMUSCULAR | Status: DC | PRN
  Administered 2017-10-18: 4 mg via INTRAVENOUS

## 2017-10-18 MED ORDER — PROMETHAZINE HCL 25 MG/ML IJ SOLN: 6.2500 mg | INTRAMUSCULAR | Status: DC | PRN

## 2017-10-18 MED ORDER — ONDANSETRON HCL 4 MG PO TABS: 4.0000 mg | ORAL_TABLET | Freq: Four times a day (QID) | ORAL | Status: DC | PRN

## 2017-10-18 MED ORDER — POLYETHYLENE GLYCOL 3350 17 G PO PACK: 17.0000 g | PACK | Freq: Every day | ORAL | Status: DC | PRN

## 2017-10-18 MED ORDER — HYDROMORPHONE HCL 1 MG/ML IJ SOLN
INTRAMUSCULAR | Status: AC
  Administered 2017-10-18: 0.5 mg via INTRAVENOUS
  Filled 2017-10-18: qty 1

## 2017-10-18 MED ORDER — GLYCOPYRROLATE 0.2 MG/ML IV SOSY
PREFILLED_SYRINGE | INTRAVENOUS | Status: DC | PRN
  Administered 2017-10-18: .1 mg via INTRAVENOUS

## 2017-10-18 MED ORDER — 0.9 % SODIUM CHLORIDE (POUR BTL) OPTIME
TOPICAL | Status: DC | PRN
  Administered 2017-10-18: 1000 mL

## 2017-10-18 MED ORDER — KETOROLAC TROMETHAMINE 30 MG/ML IJ SOLN
30.0000 mg | Freq: Once | INTRAMUSCULAR | Status: AC | PRN
  Administered 2017-10-18: 30 mg via INTRAVENOUS

## 2017-10-18 MED ORDER — METOCLOPRAMIDE HCL 10 MG PO TABS: 5.0000 mg | ORAL_TABLET | Freq: Three times a day (TID) | ORAL | Status: DC | PRN

## 2017-10-18 MED ORDER — METHOCARBAMOL 1000 MG/10ML IJ SOLN
500.0000 mg | Freq: Four times a day (QID) | INTRAVENOUS | Status: DC | PRN
  Filled 2017-10-18: qty 5

## 2017-10-18 MED ORDER — HYDROMORPHONE HCL 1 MG/ML IJ SOLN
0.5000 mg | INTRAMUSCULAR | Status: DC | PRN
  Administered 2017-10-18 – 2017-10-20 (×5): 1 mg via INTRAVENOUS
  Filled 2017-10-18 (×5): qty 1

## 2017-10-18 MED ORDER — BISACODYL 10 MG RE SUPP: 10.0000 mg | Freq: Every day | RECTAL | Status: DC | PRN

## 2017-10-18 MED ORDER — ACETAMINOPHEN 325 MG PO TABS
325.0000 mg | ORAL_TABLET | Freq: Four times a day (QID) | ORAL | Status: DC | PRN
  Administered 2017-10-20: 650 mg via ORAL
  Filled 2017-10-18: qty 2

## 2017-10-18 MED ORDER — HYDROMORPHONE HCL 1 MG/ML IJ SOLN
0.2500 mg | INTRAMUSCULAR | Status: DC | PRN
  Administered 2017-10-18 (×2): 0.5 mg via INTRAVENOUS

## 2017-10-18 MED ORDER — METRONIDAZOLE IN NACL 5-0.79 MG/ML-% IV SOLN
500.0000 mg | Freq: Three times a day (TID) | INTRAVENOUS | Status: DC
  Administered 2017-10-19 (×2): 500 mg via INTRAVENOUS
  Filled 2017-10-18 (×2): qty 100

## 2017-10-18 MED ORDER — ONDANSETRON HCL 4 MG/2ML IJ SOLN
INTRAMUSCULAR | Status: AC
  Filled 2017-10-18: qty 2

## 2017-10-18 MED ORDER — EPHEDRINE 5 MG/ML INJ
INTRAVENOUS | Status: AC
  Filled 2017-10-18: qty 10

## 2017-10-18 MED ORDER — DEXAMETHASONE SODIUM PHOSPHATE 10 MG/ML IJ SOLN
INTRAMUSCULAR | Status: AC
  Filled 2017-10-18: qty 1

## 2017-10-18 MED ORDER — LACTATED RINGERS IV SOLN
INTRAVENOUS | Status: DC
  Administered 2017-10-18: 13:00:00 via INTRAVENOUS

## 2017-10-18 MED ORDER — PROPOFOL 10 MG/ML IV BOLUS
INTRAVENOUS | Status: DC | PRN
  Administered 2017-10-18: 120 mg via INTRAVENOUS

## 2017-10-18 MED ORDER — MIDAZOLAM HCL 5 MG/5ML IJ SOLN
INTRAMUSCULAR | Status: DC | PRN
  Administered 2017-10-18: 2 mg via INTRAVENOUS

## 2017-10-18 MED ORDER — KETOROLAC TROMETHAMINE 30 MG/ML IJ SOLN
INTRAMUSCULAR | Status: AC
Start: 2017-10-18 — End: 2017-10-19
  Filled 2017-10-18: qty 1

## 2017-10-18 MED ORDER — ONDANSETRON HCL 4 MG/2ML IJ SOLN: 4.0000 mg | Freq: Four times a day (QID) | INTRAMUSCULAR | Status: DC | PRN

## 2017-10-18 MED ORDER — MAGNESIUM CITRATE PO SOLN: 1.0000 | Freq: Once | ORAL | Status: DC | PRN

## 2017-10-18 MED ORDER — OXYCODONE HCL 5 MG PO TABS
10.0000 mg | ORAL_TABLET | ORAL | Status: DC | PRN
  Administered 2017-10-19 – 2017-10-20 (×2): 15 mg via ORAL
  Filled 2017-10-18 (×2): qty 3

## 2017-10-18 SURGICAL SUPPLY — 34 items
BLADE SAW RECIP 87.9 MT (BLADE) ×2 IMPLANT
BLADE SURG 21 STRL SS (BLADE) ×2 IMPLANT
BNDG COHESIVE 6X5 TAN STRL LF (GAUZE/BANDAGES/DRESSINGS) ×4 IMPLANT
BNDG GAUZE ELAST 4 BULKY (GAUZE/BANDAGES/DRESSINGS) ×4 IMPLANT
CANISTER SUCTION 1500CC (MISCELLANEOUS) ×1 IMPLANT
COVER SURGICAL LIGHT HANDLE (MISCELLANEOUS) ×2 IMPLANT
CUFF TOURNIQUET SINGLE 34IN LL (TOURNIQUET CUFF) IMPLANT
CUFF TOURNIQUET SINGLE 44IN (TOURNIQUET CUFF) IMPLANT
DRAPE INCISE IOBAN 66X45 STRL (DRAPES) IMPLANT
DRAPE U-SHAPE 47X51 STRL (DRAPES) ×2 IMPLANT
DRESSING PREVENA PLUS CUSTOM (GAUZE/BANDAGES/DRESSINGS) ×1 IMPLANT
DRSG PREVENA PLUS CUSTOM (GAUZE/BANDAGES/DRESSINGS) ×4
DRSG VAC ATS LRG SENSATRAC (GAUZE/BANDAGES/DRESSINGS) ×1 IMPLANT
ELECT REM PT RETURN 9FT ADLT (ELECTROSURGICAL) ×2
ELECTRODE REM PT RTRN 9FT ADLT (ELECTROSURGICAL) ×1 IMPLANT
GLOVE BIOGEL PI IND STRL 9 (GLOVE) ×1 IMPLANT
GLOVE BIOGEL PI INDICATOR 9 (GLOVE) ×1
GLOVE SURG ORTHO 9.0 STRL STRW (GLOVE) ×2 IMPLANT
GOWN STRL REUS W/ TWL XL LVL3 (GOWN DISPOSABLE) ×2 IMPLANT
GOWN STRL REUS W/TWL XL LVL3 (GOWN DISPOSABLE) ×4
KIT BASIN OR (CUSTOM PROCEDURE TRAY) ×2 IMPLANT
KIT DRSG PREVENA PLUS 7DAY 125 (MISCELLANEOUS) ×1 IMPLANT
KIT TURNOVER KIT B (KITS) ×2 IMPLANT
MANIFOLD NEPTUNE II (INSTRUMENTS) ×2 IMPLANT
NS IRRIG 1000ML POUR BTL (IV SOLUTION) ×2 IMPLANT
PACK ORTHO EXTREMITY (CUSTOM PROCEDURE TRAY) ×2 IMPLANT
PAD ARMBOARD 7.5X6 YLW CONV (MISCELLANEOUS) ×2 IMPLANT
SPONGE LAP 18X18 X RAY DECT (DISPOSABLE) IMPLANT
STAPLER VISISTAT 35W (STAPLE) IMPLANT
STOCKINETTE IMPERVIOUS LG (DRAPES) ×2 IMPLANT
SUT SILK 2 0 (SUTURE) ×2
SUT SILK 2-0 18XBRD TIE 12 (SUTURE) ×1 IMPLANT
SUT VIC AB 1 CTX 27 (SUTURE) IMPLANT
TOWEL OR 17X26 10 PK STRL BLUE (TOWEL DISPOSABLE) ×2 IMPLANT

## 2017-10-18 NOTE — NC FL2 (Signed)
[place] LEVEL OF CARE SCREENING TOOL     IDENTIFICATION  Patient Name: Brian Herring Birthdate: 1956/04/27 Sex: male Admission Date (Current Location): 10/12/2017  [facility] and [place] Number:  Herbalist and Address:  [facility] [place], [place] [address] [name], [place] [address]      Provider Number: [id number]  Attending Physician Name and Address:  Aline August, MD  Relative Name and Phone Number:       Current Level of Care: Hospital Recommended Level of Care: [place] Prior Approval Number:    Date Approved/Denied:   PASRR Number: [id number]  Discharge Plan: SNF    Current Diagnoses: Patient Active Problem List   Diagnosis Date Noted  . Sepsis ([place]) 10/12/2017  . Diabetic foot ulcer ([place]) 10/12/2017  . Diabetic foot infection ([place]) 10/12/2017  . Hyponatremia 10/12/2017  . Cellulitis of right foot 10/12/2017  . Chronic systolic CHF (congestive heart failure) ([place])   . Hyperkalemia 10/13/2015  . Congestive dilated cardiomyopathy ([place]) 07/13/2015  . Asymptomatic LV dysfunction 06/25/2015  . Poorly controlled diabetes mellitus ([place]) 05/31/2015  . HTN (hypertension), benign 07/04/2014  . Viral cardiomyopathy, EF improved to 45-50% 04/02/2013  . Obesity 04/02/2013  . Diabetic foot ulcer with osteomyelitis ([place]) 04/02/2013  . Hyperlipidemia 04/02/2013  . Sinus tachycardia 04/02/2013    Orientation RESPIRATION BLADDER Height & Weight     Self, Time, Situation, Place  Normal Continent Weight: 109.2 kg (240 lb 12.8 oz) Height:  6' (182.9 cm)  BEHAVIORAL SYMPTOMS/MOOD NEUROLOGICAL BOWEL NUTRITION STATUS  (None)   Continent Diet(Please see DC Summary)  AMBULATORY STATUS COMMUNICATION OF NEEDS Skin   Extensive Assist Verbally Surgical wounds(Closed incision on leg (right AKA))                       Personal Care Assistance Level of Assistance  Bathing, Feeding, Dressing Bathing Assistance:  Limited assistance Feeding assistance: Independent Dressing Assistance: Limited assistance     Functional Limitations Info             SPECIAL CARE FACTORS FREQUENCY  PT (By licensed PT), OT (By licensed OT)     PT Frequency: 5x/week OT Frequency: 3x/week            Contractures      Additional Factors Info  Code Status, Allergies, Insulin Sliding Scale Code Status Info: Full Allergies Info: Sulfur   Insulin Sliding Scale Info: 3x daily with meals and at bedtime       Current Medications (10/18/2017):  This is the current hospital active medication list Current Facility-Administered Medications  Medication Dose Route Frequency Provider Last Rate Last Dose  . 0.9 %  sodium chloride infusion   Intravenous Continuous [name], MD      . acetaminophen (TYLENOL) tablet 650 mg  650 mg Oral Q6H PRN [name], MD   650 mg at 10/14/17 1355   Or  . acetaminophen (TYLENOL) suppository 650 mg  650 mg Rectal Q6H PRN [name], MD      . Derrill Memo ON 10/19/2017] acetaminophen (TYLENOL) tablet 325-650 mg  325-650 mg Oral Q6H PRN [name], MD      . atorvastatin (LIPITOR) tablet 80 mg  80 mg Oral Daily [name], MD   Stopped at 10/18/17 [phone]  . bisacodyl (DULCOLAX) suppository 10 mg  10 mg Rectal Daily PRN [name], MD      . cefTRIAXone (ROCEPHIN)  2 g in sodium chloride 0.9 % 100 mL IVPB  2 g Intravenous Q24H [name], MD   Stopped at 10/17/17 2200  . docusate sodium (COLACE) capsule 100 mg  100 mg Oral BID [name], MD      . feeding supplement (PRO-STAT SUGAR FREE 64) liquid 30 mL  30 mL Oral BID [name], MD   30 mL at 10/17/17 2104  . hydrALAZINE (APRESOLINE) injection 5 mg  5 mg Intravenous Q2H PRN [name], MD      . HYDROmorphone (DILAUDID) injection 0.5-1 mg  0.5-1 mg Intravenous Q4H PRN [name], MD      . insulin aspart (novoLOG) injection 0-5 Units  0-5 Units Subcutaneous QHS [name], MD   2 Units at  10/17/17 2247  . insulin aspart (novoLOG) injection 0-9 Units  0-9 Units Subcutaneous TID WC [name], MD   1 Units at 10/18/17 1224  . ketorolac (TORADOL) 30 MG/ML injection           . lactated ringers infusion   Intravenous Continuous [name], MD 10 mL/hr at 10/18/17 1259    . living well with diabetes book MISC   Does not apply Once [name], MD      . loratadine (CLARITIN) tablet 10 mg  10 mg Oral Daily [name], MD   Stopped at 10/18/17 [phone]  . magnesium citrate solution 1 Bottle  1 Bottle Oral Once PRN [name], MD      . methocarbamol (ROBAXIN) tablet 500 mg  500 mg Oral Q6H PRN [name], MD       Or  . methocarbamol (ROBAXIN) 500 mg in dextrose 5 % 50 mL IVPB  500 mg Intravenous Q6H PRN [name], MD      . metoCLOPramide (REGLAN) tablet 5-10 mg  5-10 mg Oral Q8H PRN [name], MD       Or  . metoCLOPramide (REGLAN) injection 5-10 mg  5-10 mg Intravenous Q8H PRN [name], MD      . metoprolol succinate (TOPROL-XL) 24 hr tablet 25 mg  25 mg Oral QPM [name], MD   25 mg at 10/17/17 1729  . metroNIDAZOLE (FLAGYL) IVPB 500 mg  500 mg Intravenous Q8H [name], MD 100 mL/hr at 10/18/17 0524 500 mg at 10/18/17 0524  . ondansetron (ZOFRAN) injection 4 mg  4 mg Intravenous Q8H PRN [name], MD   4 mg at 10/13/17 1644  . ondansetron (ZOFRAN) tablet 4 mg  4 mg Oral Q6H PRN [name], MD       Or  . ondansetron [facility]) injection 4 mg  4 mg Intravenous Q6H PRN [name], MD      . oxyCODONE (Oxy IR/ROXICODONE) immediate release tablet 10-15 mg  10-15 mg Oral Q4H PRN [name], MD      . oxyCODONE (Oxy IR/ROXICODONE) immediate release tablet 5-10 mg  5-10 mg Oral Q4H PRN [name], MD      . polyethylene glycol (MIRALAX / GLYCOLAX) packet 17 g  17 g Oral Daily PRN [name], MD      . polyethylene glycol (MIRALAX / GLYCOLAX) packet 17 g  17 g Oral Daily PRN [name], MD      . promethazine  (PHENERGAN) injection 12.5 mg  12.5 mg Intravenous Q6H PRN [name], MD   12.5  mg at 10/13/17 2239  . saccharomyces boulardii (FLORASTOR) capsule 250 mg  250 mg Oral BID [name], MD   Stopped at 10/18/17 [phone]  . sacubitril-valsartan (ENTRESTO) 97-103 mg per tablet  1 tablet Oral BID [name], MD   Stopped at 10/18/17 0813  . sodium chloride (OCEAN) 0.65 % nasal spray 1 spray  1 spray Each Nare PRN [name], MD      . spironolactone (ALDACTONE) tablet 25 mg  25 mg Oral QPM [name], MD   25 mg at 10/17/17 1731  . zolpidem (AMBIEN) tablet 5 mg  5 mg Oral QHS PRN [name], MD   5 mg at 10/13/17 2217     Discharge Medications: Please see discharge summary for a list of discharge medications.  Relevant Imaging Results:  Relevant Lab Results:   Additional Information SSN: [place], [place]

## 2017-10-18 NOTE — Progress Notes (Signed)
Inpatient Diabetes Program Recommendations  AACE/ADA: New Consensus Statement on Inpatient Glycemic Control (2015)  Target Ranges:  Prepandial:   less than 140 mg/dL      Peak postprandial:   less than 180 mg/dL (1-2 hours)      Critically ill patients:  140 - 180 mg/dL   Lab Results  Component Value Date   GLUCAP 174 (H) 10/18/2017   HGBA1C 8.7 (H) 10/12/2017    Review of Glycemic Control Results for Brian Herring, Brian Herring (MRN [id number]) as of 10/18/2017 11:30  Ref. Range 10/17/2017 12:11 10/17/2017 16:49 10/17/2017 21:39 10/18/2017 07:36  Glucose-Capillary Latest Ref Range: 65 - 99 mg/dL 197 (H) 314 (H) 221 (H) 174 (H)   Diabetes history: Type 2 DM Outpatient Diabetes medications: Precose 25 mg TID, Jardiance 25 mg QAM, Amaryl 4mg  QAM, Kombiglyze XR 2.11-998 mg BID Current orders for Inpatient glycemic control: Novolog 0-9 units TID, Novolog 0-5 units QHS  Inpatient Diabetes Program Recommendations:    In the setting of steroids, consider starting Lantus 12 units QD.   Thanks, Bronson Curb, MSN, RNC-OB Diabetes Coordinator [phone] (8a-5p)

## 2017-10-18 NOTE — Transfer of Care (Signed)
Immediate Anesthesia Transfer of Care Note  Patient: Brian Herring  Procedure(s) Performed: RIGHT BELOW KNEE AMPUTATION (Right )  Patient Location: PACU  Anesthesia Type:General  Level of Consciousness: sedated and drowsy  Airway & Oxygen Therapy: Patient Spontanous Breathing and Patient connected to face mask oxygen  Post-op Assessment: Report given to RN and Post -op Vital signs reviewed and stable  Post vital signs: Reviewed and stable  Last Vitals:  Vitals Value Taken Time  BP 104/68 10/18/2017  2:40 PM  Temp    Pulse 85 10/18/2017  2:42 PM  Resp 19 10/18/2017  2:42 PM  SpO2 98 % 10/18/2017  2:42 PM  Vitals shown include unvalidated device data.  Last Pain:  Vitals:   10/18/17 0807  TempSrc:   PainSc: 0-No pain      Patients Stated Pain Goal: 0 (82/50/03 7048)  Complications: No apparent anesthesia complications

## 2017-10-18 NOTE — Op Note (Signed)
   Date of Surgery: 10/18/2017  INDICATIONS: Mr. Siefring is a 62 y.o.-year-old male who is status post foot salvage intervention however the abscess infection extended up to the ankle.  Patient does not have foot salvage options and presents at this time for transtibial amputation.  PREOPERATIVE DIAGNOSIS:  Abscess osteomyelitis ulceration right foot.Marland Kitchen   POSTOPERATIVE DIAGNOSIS: Same.  PROCEDURE: Transtibial amputation Application of Prevena wound VAC  SURGEON: Sharol Given, M.D.  ANESTHESIA:  general  IV FLUIDS AND URINE: See anesthesia.  ESTIMATED BLOOD LOSS: 100 mL.  COMPLICATIONS: None.  DESCRIPTION OF PROCEDURE: The patient was brought to the operating room and underwent a general anesthetic. After adequate levels of anesthesia were obtained patient's lower extremity was prepped using DuraPrep draped into a sterile field. A timeout was called. The foot was draped out of the sterile field with impervious stockinette. A transverse incision was made 11 cm distal to the tibial tubercle. This curved proximally and a large posterior flap was created. The tibia was transected 1 cm proximal to the skin incision. The fibula was transected just proximal to the tibial incision. The tibia was beveled anteriorly. A large posterior flap was created. The sciatic nerve was pulled cut and allowed to retract. The vascular bundles were suture ligated with 2-0 silk. The deep and superficial fascial layers were closed using #1 Vicryl. The skin was closed using staples and 2-0 nylon. The wound was covered with a Prevena wound VAC. There was a good suction fit. A prosthetic shrinker was applied. Patient was extubated taken to the PACU in stable condition.   DISCHARGE PLANNING:  Antibiotic duration: 24 hours postoperatively.  Weightbearing: Nonweightbearing on the right.  Pain medication: As needed.  Dressing care/ Wound VAC: Continue wound VAC with the Praveena portable pump at discharge.  Discharge to:  Possible skilled nursing facility.  Follow-up: In the office 1 week post operative.  Meridee Score, MD [place] 2:41 PM

## 2017-10-18 NOTE — Interval H&P Note (Signed)
History and Physical Interval Note:  10/18/2017 7:06 AM  Brian Herring  has presented today for surgery, with the diagnosis of Right Foot Abscess, Osteomyelitis  The various methods of treatment have been discussed with the patient and family. After consideration of risks, benefits and other options for treatment, the patient has consented to  Procedure(s): RIGHT BELOW KNEE AMPUTATION (Right) as a surgical intervention .  The patient's history has been reviewed, patient examined, no change in status, stable for surgery.  I have reviewed the patient's chart and labs.  Questions were answered to the patient's satisfaction.     [name]

## 2017-10-18 NOTE — Progress Notes (Signed)
Patient ID: Brian Herring, male   DOB: Sep 25, 1955, 62 y.o.   MRN: [id number]  PROGRESS NOTE    ANN GROENEVELD  MPN:[id number] DOB: 04/10/1956 DOA: 10/12/2017 PCP: [name], MD   Brief Narrative:  62 year old male with history of hypertension, hyperlipidemia, diabetes mellitus, systolic CHF with EF of 00% presented with right foot ulcer with infection.  MRI showed osteomyelitis.  Orthopedics was consulted.  Patient was started on broad-spectrum antibiotics.   Assessment & Plan:   Principal Problem:   Diabetic foot infection ([place]) Active Problems:   Diabetic foot ulcer with osteomyelitis ([place])   Hyperlipidemia   HTN (hypertension), benign   Poorly controlled diabetes mellitus ([place])   Sepsis ([place])   Diabetic foot ulcer ([place])   Hyponatremia   Chronic systolic CHF (congestive heart failure) ([place])   Cellulitis of right foot   Sepsis secondary to right great toe osteomyelitis and cellulitis -Resolved.  Hemodynamically stable currently.  Antibiotic plan as below  Right foot osteomyelitis and cellulitis -Currently on Flagyl and Rocephin.  Wound culture grew moderate viridans Streptococcus.  Stopped vancomycin on 10/16/2017 -Continue pain control -MRI right foot showed soft tissue ulceration along the medial aspect of the great toe with extensive gas forming infection of the great toe and osteomyelitis of the first proximal and distal phalanges.  Large 6 cm abscess about the great toe extending proximally to the mid first metatarsal.  Infectious tenosynovitis of the extensor hallux longus tendon -ABI WNL B/L (right 1.09, Left 1.17). Right TBI could not be ascertained. 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 Repeat a.m. labs  Diabetes mellitus, type II, poorly controlled -Continue insulin sliding scale and CBG  monitoring -Home medications held -Hemoglobin A1c 8.7  Chronic systolic heart failure -Echocardiogram 09/02/2015: EF of 25-30% -Patient currently appears to be compensated and euvolemic -spironolactone held due to sepsis initially. -Continue Entresto, metoprolol. -Blood pressure stable currently.  Spironolactone was restarted on 10/16/2017.  Hyponatremia, chronic -Suspect secondary to diuretics and Entresto -After reviewing patient's chart, appears to be a chronic issue -Improving -Continue to monitor BMP  Essential hypertension -Continue metoprolol, Entresto and spironolactone.  Blood pressure stable. monitor.  Hyperlipidemia -Continue statin  Slight blood in stools -Hemoglobin stable, better than yesterday.  Patient thinks that his bleeding has stopped.  Hold heparin and aspirin today as well.  Monitor H&H.  Patient has an outpatient schedule with [facility] for colonoscopy.  If persistent bloody stool, will call GI consult.    DVT prophylaxis: Heparin on hold Code Status: Full Family Communication: None at bedside Disposition Plan: Depends on clinical outcome  Consultants: Orthopedics  Procedures:  ABI Amputation of 1st and 2nd right ray on 10/14/2017   Antimicrobials:   Anti-infectives (From admission, onward)   Start     Dose/Rate Route Frequency Ordered Stop   10/18/17 0600  ceFAZolin (ANCEF) IVPB 2g/100 mL premix     2 g 200 mL/hr over 30 Minutes Intravenous On call to O.R. 10/17/17 1944 10/19/17 0559   10/18/17 0600  ceFAZolin (ANCEF) IVPB 2g/100 mL premix  Status:  Discontinued     2 g 200 mL/hr over 30 Minutes Intravenous On call to O.R. 10/17/17 2242 10/17/17 2244   10/14/17 0600  ceFAZolin (ANCEF) IVPB 2g/100 mL premix     2 g 200 mL/hr over 30 Minutes Intravenous On call to O.R. 10/14/17 8676 10/14/17 1950  10/13/17 0300  vancomycin (VANCOCIN) IVPB 750 mg/150 ml premix  Status:  Discontinued     750 mg 150 mL/hr over 60 Minutes Intravenous Every 12  hours 10/12/17 1435 10/16/17 1436   10/12/17 2130  cefTRIAXone (ROCEPHIN) 2 g in sodium chloride 0.9 % 100 mL IVPB     2 g 200 mL/hr over 30 Minutes Intravenous Every 24 hours 10/12/17 2036     10/12/17 2045  metroNIDAZOLE (FLAGYL) IVPB 500 mg     500 mg 100 mL/hr over 60 Minutes Intravenous Every 8 hours 10/12/17 2036     10/12/17 2000  piperacillin-tazobactam (ZOSYN) IVPB 3.375 g  Status:  Discontinued     3.375 g 12.5 mL/hr over 240 Minutes Intravenous Every 8 hours 10/12/17 1435 10/12/17 2036   10/12/17 1545  vancomycin (VANCOCIN) IVPB 1000 mg/200 mL premix  Status:  Discontinued     1,000 mg 200 mL/hr over 60 Minutes Intravenous  Once 10/12/17 1442 10/12/17 2103   10/12/17 1445  vancomycin (VANCOCIN) IVPB 1000 mg/200 mL premix     1,000 mg 200 mL/hr over 60 Minutes Intravenous  Once 10/12/17 1442 10/12/17 1545   10/12/17 1430  vancomycin (VANCOCIN) 2,000 mg in sodium chloride 0.9 % 500 mL IVPB  Status:  Discontinued     2,000 mg 250 mL/hr over 120 Minutes Intravenous  Once 10/12/17 1425 10/12/17 1442   10/12/17 1400  vancomycin (VANCOCIN) IVPB 1000 mg/200 mL premix  Status:  Discontinued     1,000 mg 200 mL/hr over 60 Minutes Intravenous  Once 10/12/17 1348 10/12/17 1425   10/12/17 1345  piperacillin-tazobactam (ZOSYN) IVPB 3.375 g     3.375 g 100 mL/hr over 30 Minutes Intravenous  Once 10/12/17 1339 10/12/17 1441         Subjective: Patient seen and examined at bedside.  No new complaints.  He thinks his rectal bleeding has stopped.  No overnight fever or vomiting or abdominal pain. Objective: Vitals:   10/17/17 1526 10/17/17 1729 10/17/17 2054 10/18/17 0510  BP: 114/64 132/80 130/78 114/68  Pulse: 80 95  85  Resp: 18   18  Temp:   98.7 F (37.1 C) (!) 97.5 F (36.4 C)  TempSrc:   Oral Oral  SpO2: 96%   96%  Weight:    109.2 kg (240 lb 12.8 oz)  Height:        Intake/Output Summary (Last 24 hours) at 10/18/2017 1019 Last data filed at 10/18/2017 0536 Gross per 24  hour  Intake 100 ml  Output 350 ml  Net -250 ml   Filed Weights   10/16/17 0530 10/17/17 0456 10/18/17 0510  Weight: 108.4 kg (239 lb) 109.1 kg (240 lb 8.4 oz) 109.2 kg (240 lb 12.8 oz)    Examination:  General exam: No acute distress  respiratory system: Bilateral decreased breath sounds at bases Cardiovascular system: S1-S2 heard, rate controlled  gastrointestinal system: Abdomen is nondistended, soft and nontender. Normal bowel sounds heard. Extremities: No cyanosis, clubbing; right foot dressing present  Data Reviewed: I have personally reviewed following labs and imaging studies  CBC: Recent Labs  Lab 10/12/17 1315  10/14/17 0552 10/15/17 0621 10/16/17 0757 10/17/17 0628 10/17/17 1911 10/18/17 0647  WBC 23.5*   < > 19.5* 16.8* 13.7* 14.9*  --  13.9*  NEUTROABS 20.7*  --   --   --   --  11.1*  --  10.2*  HGB 13.2   < > 11.6* 10.9* 10.8* 11.4* 11.0* 12.2*  HCT 39.7   < >  36.2* 34.6* 34.4* 36.6* 34.8* 37.7*  MCV 89.8   < > 91.9 92.5 93.0 91.5  --  92.4  PLT 351   < > 376 399 399 430*  --  430*   < > = values in this interval not displayed.   Basic Metabolic Panel: Recent Labs  Lab 10/14/17 0552 10/15/17 0621 10/16/17 0757 10/17/17 0628 10/18/17 0647  NA 132* 134* 135 136 134*  K 4.3 4.2 4.5 4.2 4.3  CL 98* 99* 101 102 101  CO2 21* 25 25 24 24   GLUCOSE 191* 180* 163* 185* 173*  BUN 25* 21* 14 14 9   CREATININE 1.00 0.92 0.75 0.71 0.64  CALCIUM 8.2* 8.4* 8.3* 8.4* 8.5*  MG  --   --   --  2.1 1.8   GFR: Estimated Creatinine Clearance: 123.7 mL/min (by C-G formula based on SCr of 0.64 mg/dL). Liver Function Tests: Recent Labs  Lab 10/12/17 1315  AST 26  ALT 28  ALKPHOS 113  BILITOT 1.2  PROT 8.0  ALBUMIN 3.4*   No results for input(s): LIPASE, AMYLASE in the last 168 hours. No results for input(s): AMMONIA in the last 168 hours. Coagulation Profile: Recent Labs  Lab 10/12/17 2107  INR 1.28   Cardiac Enzymes: No results for input(s): CKTOTAL,  CKMB, CKMBINDEX, TROPONINI in the last 168 hours. BNP (last 3 results) No results for input(s): PROBNP in the last 8760 hours. HbA1C: No results for input(s): HGBA1C in the last 72 hours. CBG: Recent Labs  Lab 10/17/17 0818 10/17/17 1211 10/17/17 1649 10/17/17 2139 10/18/17 0736  GLUCAP 186* 197* 314* 221* 174*   Lipid Profile: No results for input(s): CHOL, HDL, LDLCALC, TRIG, CHOLHDL, LDLDIRECT in the last 72 hours. Thyroid Function Tests: No results for input(s): TSH, T4TOTAL, FREET4, T3FREE, THYROIDAB in the last 72 hours. Anemia Panel: No results for input(s): VITAMINB12, FOLATE, FERRITIN, TIBC, IRON, RETICCTPCT in the last 72 hours. Sepsis Labs: Recent Labs  Lab 10/12/17 1319 10/12/17 1515 10/12/17 2107 10/13/17 0041  PROCALCITON  --   --  0.35  --   LATICACIDVEN 2.43* 1.27 0.9 1.4    Recent Results (from the past 240 hour(s))  Culture, blood (routine x 2)     Status: None   Collection Time: 10/12/17  1:14 PM  Result Value Ref Range Status   Specimen Description   Final    BLOOD RIGHT ANTECUBITAL Performed at [facility], [address] [place], [place] [address]    Special Requests   Final    BOTTLES DRAWN AEROBIC AND ANAEROBIC Blood Culture adequate volume Performed at [facility], [address], [place], [place] [address]    Culture   Final    NO GROWTH 5 DAYS Performed at [facility], [place] [address] [place], [place] [address]    Report Status 10/17/2017 FINAL  Final  Culture, blood (routine x 2)     Status: None   Collection Time: 10/12/17  2:00 PM  Result Value Ref Range Status   Specimen Description   Final    BLOOD LEFT ANTECUBITAL Performed at [facility], [address] [place], [place] [address]    Special Requests   Final    BOTTLES DRAWN AEROBIC AND ANAEROBIC Blood Culture adequate volume Performed at [facility], [address] [place], [place] [address]    Culture   Final    NO  GROWTH 5 DAYS Performed at  [facility], [place] [address], [place], [place] [address]    Report Status 10/17/2017 FINAL  Final  MRSA PCR Screening     Status: None   Collection Time: 10/13/17  4:25 PM  Result Value Ref Range Status   MRSA by PCR NEGATIVE NEGATIVE Final    Comment:        The GeneXpert MRSA Assay (FDA approved for NASAL specimens only), is one component of a comprehensive MRSA colonization surveillance program. It is not intended to diagnose MRSA infection nor to guide or monitor treatment for MRSA infections. Performed at [facility], [place] [address], [place], [place] [address]   Surgical pcr screen     Status: None   Collection Time: 10/14/17  4:36 AM  Result Value Ref Range Status   MRSA, PCR NEGATIVE NEGATIVE Final   Staphylococcus aureus NEGATIVE NEGATIVE Final    Comment: (NOTE) The Xpert SA Assay (FDA approved for NASAL specimens in patients 70 years of age and older), is one component of a comprehensive surveillance program. It is not intended to diagnose infection nor to guide or monitor treatment. Performed at [facility], [place] [address], [place], [place] [address]   Aerobic/Anaerobic Culture (surgical/deep wound)     Status: None (Preliminary result)   Collection Time: 10/14/17  7:52 AM  Result Value Ref Range Status   Specimen Description ABSCESS RIGHT TOE GREAT  Final   Special Requests NONE  Final   Gram Stain   Final    RARE WBC PRESENT, PREDOMINANTLY PMN FEW RARE GRAM POSITIVE COCCI IN PAIRS    Culture   Final    MODERATE VIRIDANS STREPTOCOCCUS MODERATE BACTEROIDES THETAIOTAOMICRON BETA LACTAMASE POSITIVE Performed at [facility], [place] [address] [place], [place] [address]    Report Status PENDING  Incomplete         Radiology Studies: No results found.      Scheduled Meds: . atorvastatin  80 mg Oral Daily  . feeding supplement (PRO-STAT SUGAR FREE 64)  30 mL Oral BID  . insulin aspart  0-5 Units  Subcutaneous QHS  . insulin aspart  0-9 Units Subcutaneous TID WC  . living well with diabetes book   Does not apply Once  . loratadine  10 mg Oral Daily  . metoprolol succinate  25 mg Oral QPM  . saccharomyces boulardii  250 mg Oral BID  . sacubitril-valsartan  1 tablet Oral BID  . spironolactone  25 mg Oral QPM   Continuous Infusions: .  ceFAZolin (ANCEF) IV    . cefTRIAXone (ROCEPHIN)  IV Stopped (10/17/17 2200)  . metronidazole 500 mg (10/18/17 0524)     LOS: 6 days        Aline August, MD [facility] Pager [phone]  If 7PM-7AM, please contact night-coverage [email or web address] Password [facility] 10/18/2017, 10:19 AM

## 2017-10-18 NOTE — Anesthesia Postprocedure Evaluation (Signed)
Anesthesia Post Note  Patient: Brian Herring  Procedure(s) Performed: RIGHT BELOW KNEE AMPUTATION (Right )     Patient location during evaluation: PACU Anesthesia Type: General Level of consciousness: awake Pain management: pain level controlled Vital Signs Assessment: post-procedure vital signs reviewed and stable Respiratory status: spontaneous breathing Cardiovascular status: stable Postop Assessment: no apparent nausea or vomiting Anesthetic complications: no    Last Vitals:  Vitals:   10/18/17 1515 10/18/17 1530  BP: 112/65 119/68  Pulse: 89 89  Resp: 18 20  Temp:  (!) 36.1 C  SpO2: 95% 94%    Last Pain:  Vitals:   10/18/17 1530  TempSrc:   PainSc: Asleep   Pain Goal: Patients Stated Pain Goal: 0 (10/17/17 0730)               Bailey Kolbe JR,JOHN Mate[facility] Flow

## 2017-10-18 NOTE — Anesthesia Procedure Notes (Signed)
Procedure Name: LMA Insertion Date/Time: 10/18/2017 2:04 PM Performed by: Orlie Dakin, CRNA Pre-anesthesia Checklist: Patient identified, Emergency Drugs available, Suction available, Timeout performed and Patient being monitored Patient Re-evaluated:Patient Re-evaluated prior to induction Oxygen Delivery Method: Circle system utilized Preoxygenation: Pre-oxygenation with 100% oxygen Induction Type: IV induction Ventilation: Mask ventilation without difficulty LMA: LMA inserted LMA Size: 5.0 Tube type: Oral Number of attempts: 1 Placement Confirmation: positive ETCO2 Tube secured with: Tape Dental Injury: Teeth and Oropharynx as per pre-operative assessment

## 2017-10-18 NOTE — Anesthesia Preprocedure Evaluation (Signed)
Anesthesia Evaluation  Patient identified by MRN, date of birth, ID band Patient awake    Reviewed: Allergy & Precautions, NPO status , Patient's Chart, lab work & pertinent test results  Airway Mallampati: II  TM Distance: >3 FB Neck ROM: Full    Dental  (+) Missing, Dental Advisory Given, Poor Dentition,    Pulmonary neg pulmonary ROS,    Pulmonary exam normal        Cardiovascular hypertension, Pt. on medications and Pt. on home beta blockers Normal cardiovascular exam Rhythm:Regular Rate:Normal     Neuro/Psych negative neurological ROS  negative psych ROS   GI/Hepatic   Endo/Other  diabetes, Type 2, Oral Hypoglycemic Agents, Insulin Dependent  Renal/GU   negative genitourinary   Musculoskeletal   Abdominal (+) + obese,   Peds  Hematology  (+) Blood dyscrasia, anemia ,   Anesthesia Other Findings   Reproductive/Obstetrics                             Anesthesia Physical  Anesthesia Plan  ASA: III  Anesthesia Plan: General   Post-op Pain Management:    Induction: Intravenous  PONV Risk Score and Plan: 2 and Ondansetron, Treatment may vary due to age or medical condition and Midazolam  Airway Management Planned: LMA  Additional Equipment:   Intra-op Plan:   Post-operative Plan: Extubation in OR  Informed Consent: I have reviewed the patients History and Physical, chart, labs and discussed the procedure including the risks, benefits and alternatives for the proposed anesthesia with the patient or authorized representative who has indicated his/her understanding and acceptance.   Dental advisory given  Plan Discussed with: CRNA and Surgeon  Anesthesia Plan Comments:         Anesthesia Quick Evaluation

## 2017-10-19 ENCOUNTER — Encounter (HOSPITAL_COMMUNITY): Payer: Self-pay | Admitting: Orthopedic Surgery

## 2017-10-19 DIAGNOSIS — E1142 Type 2 diabetes mellitus with diabetic polyneuropathy: Secondary | ICD-10-CM

## 2017-10-19 DIAGNOSIS — S88111A Complete traumatic amputation at level between knee and ankle, right lower leg, initial encounter: Secondary | ICD-10-CM

## 2017-10-19 LAB — CBC WITH DIFFERENTIAL/PLATELET
BAND NEUTROPHILS: 0 %
BASOS ABS: 0 10*3/uL (ref 0.0–0.1)
BLASTS: 0 %
Basophils Relative: 0 %
EOS ABS: 0 10*3/uL (ref 0.0–0.7)
Eosinophils Relative: 0 %
HEMATOCRIT: 36 % — AB (ref 39.0–52.0)
Hemoglobin: 11.3 g/dL — ABNORMAL LOW (ref 13.0–17.0)
Lymphocytes Relative: 8 %
Lymphs Abs: 1.2 10*3/uL (ref 0.7–4.0)
MCH: 29.2 pg (ref 26.0–34.0)
MCHC: 31.4 g/dL (ref 30.0–36.0)
MCV: 93 fL (ref 78.0–100.0)
METAMYELOCYTES PCT: 0 %
Monocytes Absolute: 0.9 10*3/uL (ref 0.1–1.0)
Monocytes Relative: 6 %
Myelocytes: 0 %
Neutro Abs: 13.5 10*3/uL — ABNORMAL HIGH (ref 1.7–7.7)
Neutrophils Relative %: 86 %
Other: 0 %
PLATELETS: 525 10*3/uL — AB (ref 150–400)
Promyelocytes Absolute: 0 %
RBC: 3.87 MIL/uL — AB (ref 4.22–5.81)
RDW: 14.7 % (ref 11.5–15.5)
WBC: 15.6 10*3/uL — AB (ref 4.0–10.5)
nRBC: 0 /100 WBC

## 2017-10-19 LAB — BASIC METABOLIC PANEL
Anion gap: 8 (ref 5–15)
BUN: 19 mg/dL (ref 6–20)
CALCIUM: 8.7 mg/dL — AB (ref 8.9–10.3)
CO2: 27 mmol/L (ref 22–32)
Chloride: 100 mmol/L — ABNORMAL LOW (ref 101–111)
Creatinine, Ser: 0.98 mg/dL (ref 0.61–1.24)
GFR calc non Af Amer: >60 mL/min (ref >60)
Glucose, Bld: 366 mg/dL — ABNORMAL HIGH (ref 65–99)
Potassium: 5.4 mmol/L — ABNORMAL HIGH (ref 3.5–5.1)
SODIUM: 135 mmol/L (ref 135–145)

## 2017-10-19 LAB — GLUCOSE, CAPILLARY
GLUCOSE-CAPILLARY: 231 mg/dL — AB (ref 65–99)
GLUCOSE-CAPILLARY: 271 mg/dL — AB (ref 65–99)
Glucose-Capillary: 305 mg/dL — ABNORMAL HIGH (ref 65–99)
Glucose-Capillary: 298 mg/dL — ABNORMAL HIGH (ref 65–99)
Glucose-Capillary: 308 mg/dL — ABNORMAL HIGH (ref 65–99)
Glucose-Capillary: 300 mg/dL — ABNORMAL HIGH (ref 65–99)

## 2017-10-19 LAB — MAGNESIUM: MAGNESIUM: 2 mg/dL (ref 1.7–2.4)

## 2017-10-19 LAB — AEROBIC/ANAEROBIC CULTURE (SURGICAL/DEEP WOUND)

## 2017-10-19 LAB — AEROBIC/ANAEROBIC CULTURE W GRAM STAIN (SURGICAL/DEEP WOUND)

## 2017-10-19 MED ORDER — INSULIN GLARGINE 100 UNIT/ML ~~LOC~~ SOLN
12.0000 [IU] | Freq: Every day | SUBCUTANEOUS | Status: DC
Start: 2017-10-19 — End: 2017-10-20
  Administered 2017-10-19: 12 [IU] via SUBCUTANEOUS
  Filled 2017-10-19: qty 0.12

## 2017-10-19 MED ORDER — HEPARIN SODIUM (PORCINE) 5000 UNIT/ML IJ SOLN
5000.0000 [IU] | Freq: Three times a day (TID) | INTRAMUSCULAR | Status: DC
  Administered 2017-10-19 – 2017-10-20 (×4): 5000 [IU] via SUBCUTANEOUS
  Filled 2017-10-19 (×4): qty 1

## 2017-10-19 NOTE — Progress Notes (Signed)
Patient ID: TYCE DELCID, male   DOB: 02/24/1956, 62 y.o.   MRN: [id number]  PROGRESS NOTE    PERICLES CARMICHEAL  NTI:[id number] DOB: 1955-12-08 DOA: 10/12/2017 PCP: [name], MD   Brief Narrative:  62 year old male with history of hypertension, hyperlipidemia, diabetes mellitus, systolic CHF with EF of 86% presented with right foot ulcer with infection.  MRI showed osteomyelitis.  Orthopedics was consulted.  Patient was started on broad-spectrum antibiotics.   Assessment & Plan:   Principal Problem:   Diabetic foot infection ([place]) Active Problems:   Diabetic foot ulcer with osteomyelitis ([place])   Hyperlipidemia   HTN (hypertension), benign   Poorly controlled diabetes mellitus ([place])   Sepsis ([place])   Diabetic foot ulcer ([place])   Hyponatremia   Chronic systolic CHF (congestive heart failure) ([place])   Cellulitis of right foot   Sepsis secondary to right great toe osteomyelitis and cellulitis -Resolved.  Hemodynamically stable currently.  Antibiotic plan as below  Right foot osteomyelitis and cellulitis -Currently on Flagyl and Rocephin.  Wound culture grew moderate viridans Streptococcus.  Stopped vancomycin on 10/16/2017.  Will DC Flagyl today.  Probable switch to oral doxycycline in a.m. -Continue pain control -MRI right foot showed soft tissue ulceration along the medial aspect of the great toe with extensive gas forming infection of the great toe and osteomyelitis of the first proximal and distal phalanges.  Large 6 cm abscess about the great toe extending proximally to the mid first metatarsal.  Infectious tenosynovitis of the extensor hallux longus tendon -ABI WNL B/L (right 1.09, Left 1.17). Right TBI could not be ascertained. 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  Wound care and wound VAC care as per orthopedics recommendations. -PT/OT  eval.  Leukocytosis -Probably secondary to above.  Slightly worse today.  Repeat a.m. labs  Diabetes mellitus, type II, poorly controlled with hyperglycemia -Sugars are elevated.  We will add Lantus 12 units at bedside along with Accu-Cheks with coverage. -Home medications held -Hemoglobin A1c 8.7  Chronic systolic heart failure -Echocardiogram 09/02/2015: EF of 25-30% -Patient currently appears to be compensated and euvolemic -spironolactone held due to sepsis initially. -Continue Entresto, metoprolol. -Blood pressure stable currently.  Spironolactone was restarted on 10/16/2017. -We will hold spironolactone because of mild hyperkalemia today  Mild hyperkalemia -Hold spironolactone.  Repeat a.m. labs  Hyponatremia, chronic -Suspect secondary to diuretics and Entresto -After reviewing patient's chart, appears to be a chronic issue -Improved -Continue to monitor BMP  Essential hypertension -Continue metoprolol, Entresto.  Hold spironolactone.  Blood pressure stable. monitor.  Hyperlipidemia -Continue statin  Slight blood in stools -Hemoglobin stable.  Patient thinks that his bleeding has stopped.  Held heparin and aspirin. Monitor H&H.  Patient has an outpatient schedule with [facility] for colonoscopy. -We will resume heparin for DVT prophylaxis  DVT prophylaxis: Resume heparin Code Status: Full Family Communication: None at bedside Disposition Plan: May need nursing home  Consultants: Orthopedics  Procedures:  ABI Amputation of 1st and 2nd right ray on 10/14/2017   Antimicrobials:   Anti-infectives (From admission, onward)   Start     Dose/Rate Route Frequency Ordered Stop   10/19/17 0000  metroNIDAZOLE (FLAGYL) IVPB 500 mg     500 mg 100 mL/hr over 60 Minutes Intravenous Every 8 hours 10/18/17 1736     10/18/17 0600  ceFAZolin (ANCEF) IVPB 2g/100 mL premix     2 g 200 mL/hr  over 30 Minutes Intravenous On call to O.R. 10/17/17 1944 10/18/17 1405   10/18/17  0600  ceFAZolin (ANCEF) IVPB 2g/100 mL premix  Status:  Discontinued     2 g 200 mL/hr over 30 Minutes Intravenous On call to O.R. 10/17/17 2242 10/17/17 2244   10/14/17 0600  ceFAZolin (ANCEF) IVPB 2g/100 mL premix     2 g 200 mL/hr over 30 Minutes Intravenous On call to O.R. 10/14/17 0559 10/14/17 0738   10/13/17 0300  vancomycin (VANCOCIN) IVPB 750 mg/150 ml premix  Status:  Discontinued     750 mg 150 mL/hr over 60 Minutes Intravenous Every 12 hours 10/12/17 1435 10/16/17 1436   10/12/17 2130  cefTRIAXone (ROCEPHIN) 2 g in sodium chloride 0.9 % 100 mL IVPB     2 g 200 mL/hr over 30 Minutes Intravenous Every 24 hours 10/12/17 2036     10/12/17 2045  metroNIDAZOLE (FLAGYL) IVPB 500 mg  Status:  Discontinued     500 mg 100 mL/hr over 60 Minutes Intravenous Every 8 hours 10/12/17 2036 10/18/17 1736   10/12/17 2000  piperacillin-tazobactam (ZOSYN) IVPB 3.375 g  Status:  Discontinued     3.375 g 12.5 mL/hr over 240 Minutes Intravenous Every 8 hours 10/12/17 1435 10/12/17 2036   10/12/17 1545  vancomycin (VANCOCIN) IVPB 1000 mg/200 mL premix  Status:  Discontinued     1,000 mg 200 mL/hr over 60 Minutes Intravenous  Once 10/12/17 1442 10/12/17 2103   10/12/17 1445  vancomycin (VANCOCIN) IVPB 1000 mg/200 mL premix     1,000 mg 200 mL/hr over 60 Minutes Intravenous  Once 10/12/17 1442 10/12/17 1545   10/12/17 1430  vancomycin (VANCOCIN) 2,000 mg in sodium chloride 0.9 % 500 mL IVPB  Status:  Discontinued     2,000 mg 250 mL/hr over 120 Minutes Intravenous  Once 10/12/17 1425 10/12/17 1442   10/12/17 1400  vancomycin (VANCOCIN) IVPB 1000 mg/200 mL premix  Status:  Discontinued     1,000 mg 200 mL/hr over 60 Minutes Intravenous  Once 10/12/17 1348 10/12/17 1425   10/12/17 1345  piperacillin-tazobactam (ZOSYN) IVPB 3.375 g     3.375 g 100 mL/hr over 30 Minutes Intravenous  Once 10/12/17 1339 10/12/17 1441          Subjective: Patient seen and examined at bedside.  He denies any  overnight fever, nausea or vomiting.  His pain is well controlled. Objective: Vitals:   10/18/17 2046 10/19/17 0010 10/19/17 0356 10/19/17 0735  BP: 93/76 (!) 90/59 108/69   Pulse: 100 100 86   Resp: 18 20 20    Temp: (!) 96.5 F (35.8 C) (!) 97.5 F (36.4 C) (!) 96.8 F (36 C) 98.1 F (36.7 C)  TempSrc: Oral Oral Oral Oral  SpO2: 95% 94% 97%   Weight:   104.7 kg (230 lb 13.2 oz)   Height:        Intake/Output Summary (Last 24 hours) at 10/19/2017 1039 Last data filed at 10/19/2017 3536 Gross per 24 hour  Intake 1821.16 ml  Output 350 ml  Net 1471.16 ml   Filed Weights   10/18/17 0510 10/18/17 1254 10/19/17 0356  Weight: 109.2 kg (240 lb 12.8 oz) 109.2 kg (240 lb 12.8 oz) 104.7 kg (230 lb 13.2 oz)    Examination:  General exam: Calm and comfortable.  No distress. respiratory system: Bilateral decreased breath sounds at bases Cardiovascular system: S1-S2 heard, rate controlled  gastrointestinal system: Abdomen is nondistended, soft and nontender. Normal bowel sounds heard. Extremities:  No cyanosis, clubbing; right foot BKA with wound VAC present  Data Reviewed: I have personally reviewed following labs and imaging studies  CBC: Recent Labs  Lab 10/12/17 1315  10/15/17 0621 10/16/17 0757 10/17/17 0628 10/17/17 1911 10/18/17 0647 10/19/17 0439  WBC 23.5*   < > 16.8* 13.7* 14.9*  --  13.9* 15.6*  NEUTROABS 20.7*  --   --   --  11.1*  --  10.2* 13.5*  HGB 13.2   < > 10.9* 10.8* 11.4* 11.0* 12.2* 11.3*  HCT 39.7   < > 34.6* 34.4* 36.6* 34.8* 37.7* 36.0*  MCV 89.8   < > 92.5 93.0 91.5  --  92.4 93.0  PLT 351   < > 399 399 430*  --  430* 525*   < > = values in this interval not displayed.   Basic Metabolic Panel: Recent Labs  Lab 10/15/17 0621 10/16/17 0757 10/17/17 0628 10/18/17 0647 10/19/17 0439  NA 134* 135 136 134* 135  K 4.2 4.5 4.2 4.3 5.4*  CL 99* 101 102 101 100*  CO2 25 25 24 24 27   GLUCOSE 180* 163* 185* 173* 366*  BUN 21* 14 14 9 19   CREATININE  0.92 0.75 0.71 0.64 0.98  CALCIUM 8.4* 8.3* 8.4* 8.5* 8.7*  MG  --   --  2.1 1.8 2.0   GFR: Estimated Creatinine Clearance: 99 mL/min (by C-G formula based on SCr of 0.98 mg/dL). Liver Function Tests: Recent Labs  Lab 10/12/17 1315  AST 26  ALT 28  ALKPHOS 113  BILITOT 1.2  PROT 8.0  ALBUMIN 3.4*   No results for input(s): LIPASE, AMYLASE in the last 168 hours. No results for input(s): AMMONIA in the last 168 hours. Coagulation Profile: Recent Labs  Lab 10/12/17 2107  INR 1.28   Cardiac Enzymes: No results for input(s): CKTOTAL, CKMB, CKMBINDEX, TROPONINI in the last 168 hours. BNP (last 3 results) No results for input(s): PROBNP in the last 8760 hours. HbA1C: No results for input(s): HGBA1C in the last 72 hours. CBG: Recent Labs  Lab 10/18/17 1449 10/18/17 1710 10/18/17 2041 10/18/17 2212 10/19/17 0734  GLUCAP 137* 210* 231* 305* 298*   Lipid Profile: No results for input(s): CHOL, HDL, LDLCALC, TRIG, CHOLHDL, LDLDIRECT in the last 72 hours. Thyroid Function Tests: No results for input(s): TSH, T4TOTAL, FREET4, T3FREE, THYROIDAB in the last 72 hours. Anemia Panel: No results for input(s): VITAMINB12, FOLATE, FERRITIN, TIBC, IRON, RETICCTPCT in the last 72 hours. Sepsis Labs: Recent Labs  Lab 10/12/17 1319 10/12/17 1515 10/12/17 2107 10/13/17 0041  PROCALCITON  --   --  0.35  --   LATICACIDVEN 2.43* 1.27 0.9 1.4    Recent Results (from the past 240 hour(s))  Culture, blood (routine x 2)     Status: None   Collection Time: 10/12/17  1:14 PM  Result Value Ref Range Status   Specimen Description   Final    BLOOD RIGHT ANTECUBITAL Performed at [facility], [address] [place], [place] [address]    Special Requests   Final    BOTTLES DRAWN AEROBIC AND ANAEROBIC Blood Culture adequate volume Performed at [facility], [address], [place], [place] [address]    Culture   Final    NO GROWTH 5 DAYS Performed at [facility], [place] [address], [place], [place] [address]    Report Status 10/17/2017 FINAL  Final  Culture, blood (routine x 2)  Status: None   Collection Time: 10/12/17  2:00 PM  Result Value Ref Range Status   Specimen Description   Final    BLOOD LEFT ANTECUBITAL Performed at [facility], [address] [place], [place] [address]    Special Requests   Final    BOTTLES DRAWN AEROBIC AND ANAEROBIC Blood Culture adequate volume Performed at [facility], [address] [place], [place] [address]    Culture   Final    NO GROWTH 5 DAYS Performed at [facility], [place] [address] [place], [place] [address]    Report Status 10/17/2017 FINAL  Final  MRSA PCR Screening     Status: None   Collection Time: 10/13/17  4:25 PM  Result Value Ref Range Status   MRSA by PCR NEGATIVE NEGATIVE Final    Comment:        The GeneXpert MRSA Assay (FDA approved for NASAL specimens only), is one component of a comprehensive MRSA colonization surveillance program. It is not intended to diagnose MRSA infection nor to guide or monitor treatment for MRSA infections. Performed at [facility], [place] [address], [place], [place] [address]   Surgical pcr screen     Status: None   Collection Time: 10/14/17  4:36 AM  Result Value Ref Range Status   MRSA, PCR NEGATIVE NEGATIVE Final   Staphylococcus aureus NEGATIVE NEGATIVE Final    Comment: (NOTE) The Xpert SA Assay (FDA approved for NASAL specimens in patients 32 years of age and older), is one component of a comprehensive surveillance program. It is not intended to diagnose infection nor to guide or monitor treatment. Performed at [facility], [place] [address] [place], [place] [address]   Aerobic/Anaerobic Culture (surgical/deep wound)     Status: None (Preliminary result)   Collection Time: 10/14/17  7:52 AM  Result Value Ref Range Status   Specimen Description ABSCESS RIGHT TOE GREAT  Final   Special  Requests NONE  Final   Gram Stain   Final    RARE WBC PRESENT, PREDOMINANTLY PMN FEW RARE GRAM POSITIVE COCCI IN PAIRS    Culture   Final    MODERATE VIRIDANS STREPTOCOCCUS MODERATE BACTEROIDES THETAIOTAOMICRON BETA LACTAMASE POSITIVE Performed at [facility], [place] [address] [place], [place] [address]    Report Status PENDING  Incomplete         Radiology Studies: No results found.      Scheduled Meds: . atorvastatin  80 mg Oral Daily  . docusate sodium  100 mg Oral BID  . feeding supplement (PRO-STAT SUGAR FREE 64)  30 mL Oral BID  . insulin aspart  0-5 Units Subcutaneous QHS  . insulin aspart  0-9 Units Subcutaneous TID WC  . living well with diabetes book   Does not apply Once  . loratadine  10 mg Oral Daily  . metoprolol succinate  25 mg Oral QPM  . saccharomyces boulardii  250 mg Oral BID  . sacubitril-valsartan  1 tablet Oral BID   Continuous Infusions: . sodium chloride 10 mL/hr at 10/18/17 1618  . cefTRIAXone (ROCEPHIN)  IV Stopped (10/18/17 2244)  . lactated ringers 10 mL/hr at 10/18/17 1259  . methocarbamol (ROBAXIN)  IV    . metronidazole 500 mg (10/19/17 0805)     LOS: 7 days        Aline August, MD [facility] Pager [phone]  If 7PM-7AM, please contact night-coverage [email or web address] Password [facility] 10/19/2017,  10:39 AM

## 2017-10-19 NOTE — Clinical Social Work Note (Signed)
Clinical Social Work Assessment  Patient Details  Name: Brian Herring MRN: [id number] Date of Birth: 17-Nov-1955  Date of referral:  10/19/17               Reason for consult:  Facility Placement                Permission sought to share information with:  Facility Sport and exercise psychologist, Family Supports Permission granted to share information::  Yes, Verbal Permission Granted  Name::        Agency::  SNFs  Relationship::     Contact Information:     Housing/Transportation Living arrangements for the past 2 months:  Single Family Home Source of Information:  Patient Patient Interpreter Needed:  None Criminal Activity/Legal Involvement Pertinent to Current Situation/Hospitalization:  No - Comment as needed Significant Relationships:  Other Family Members Lives with:  Self Do you feel safe going back to the place where you live?  No Need for family participation in patient care:  No (Coment)  Care giving concerns:  CSW received consult for possible SNF placement at time of discharge. CSW spoke with patient regarding PT recommendation of SNF placement at time of discharge. Patient reported that  given patient's current physical needs and fall risk, he needs extra support. Patient expressed understanding of PT recommendation and is agreeable to SNF placement at time of discharge. CSW to continue to follow and assist with discharge planning needs.   Social Worker assessment / plan:  CSW spoke with patient concerning possibility of rehab at [facility] before returning home.  Employment status:  Therapist, music:  Managed Care PT Recommendations:  Not assessed at this time Information / Referral to community resources:  [place]  Patient/Family's Response to care:  Patient recognizes need for rehab before returning home and is agreeable to a SNF in [name]. Patient reported preference for [facility] since it is near his house.  Patient/Family's  Understanding of and Emotional Response to Diagnosis, Current Treatment, and Prognosis:  Patient/family is realistic regarding therapy needs and expressed being hopeful for SNF placement. Patient expressed understanding of CSW role and discharge process as well as medical condition. No questions/concerns about plan or treatment.    Emotional Assessment Appearance:  Appears stated age Attitude/Demeanor/Rapport:  Engaged, Gracious Affect (typically observed):  Accepting, Appropriate Orientation:  Oriented to Self, Oriented to Situation, Oriented to Place, Oriented to  Time Alcohol / Substance use:  Not Applicable Psych involvement (Current and /or in the community):  No (Comment)  Discharge Needs  Concerns to be addressed:  Care Coordination Readmission within the last 30 days:  No Current discharge risk:  Physical Impairment Barriers to Discharge:  Continued Medical Work up   [employer], [place] 10/19/2017, 1:35 PM

## 2017-10-19 NOTE — Progress Notes (Signed)
Patient ID: Brian Herring, male   DOB: 09-01-1955, 62 y.o.   MRN: [id number] Patient is postoperative day 1 transtibial amputation.  There is 50 cc in the wound VAC canister.  Patient will be fit for a stump shrinker and a stump protector.  Patient may require skilled nursing placement depending on his progress with therapy.

## 2017-10-19 NOTE — Consult Note (Signed)
Physical Medicine and Rehabilitation Consult   Reason for Consult: L-BKA Referring Physician: Dr. Starla Link   HPI: Brian Herring is a 62 y.o. male with history of T2DM, HTN, chronic systolic CHF who was admitted on 10/12/17 with infected foot ulcer with osteomyelitis. He was started on IV antibiotics and underwent right transtibial amputation. He has had issues with bloody stools felt to be due to ASA and Sq heparin. H/H stable. Chronic hyponateremia and progressive leucocytosis being monitored. Therapy evaluations done today and CIR recommended due to functional deficits. He sat up in a chair for 3 hours today and report that he has been getting to [facility].   Patient does not note any numbness in his foot.  He does have some tingling in his fingertips Review of Systems  Constitutional: Negative for chills, fever and malaise/fatigue.  Eyes: Negative for blurred vision and double vision.  Respiratory: Negative for cough and shortness of breath.   Cardiovascular: Negative for chest pain and leg swelling.  Gastrointestinal: Negative for constipation, diarrhea, heartburn and nausea.  Genitourinary: Negative for dysuria and urgency.  Musculoskeletal: Negative for back pain, myalgias and neck pain.  Skin: Negative for itching and rash.  Neurological: Negative for speech change and focal weakness.  Psychiatric/Behavioral: Negative for depression. The patient is not nervous/anxious.       Past Medical History:  Diagnosis Date  . Chronic systolic CHF (congestive heart failure) ([place])   . Diabetes mellitus without complication ([place])   . Hyperlipidemia   . Hypertension     Past Surgical History:  Procedure Laterality Date  . AMPUTATION Right 10/14/2017   Procedure: AMPUTATION  GREAT TOE;  Surgeon: [name], MD;  Location: [facility];  Service: Orthopedics;  Laterality: Right;  . AMPUTATION Right 10/18/2017   Procedure: RIGHT BELOW KNEE AMPUTATION;  Surgeon: [name], MD;  Location:  [facility];  Service: Orthopedics;  Laterality: Right;  . SHOULDER SURGERY  01/05    Family History  Problem Relation Age of Onset  . Pneumonia Mother        aspiration    Social History:  Lives alone. Disabled due to cardiac issues but independent without AD. Has friends who can check in after discharge. He  reports that he has never smoked. He has never used smokeless tobacco. He reports that he does not drink alcohol. His drug history is not on file.   Allergies  Allergen Reactions  . Sulfur Rash    Medications Prior to Admission  Medication Sig Dispense Refill  . acarbose (PRECOSE) 25 MG tablet Take 1 tablet by mouth 3 (three) times daily with meals.     Marland Kitchen aspirin 81 MG tablet Take 81 mg by mouth every morning.     . Empagliflozin (JARDIANCE PO) Take 25 mg by mouth every morning.     . furosemide (LASIX) 40 MG tablet Take 40 mg by mouth daily as needed.     Marland Kitchen glimepiride (AMARYL) 4 MG tablet Take 1 tablet by mouth every morning.   1  . ibuprofen (ADVIL,MOTRIN) 200 MG tablet Take 200 mg by mouth every 6 (six) hours as needed for fever, headache, mild pain or moderate pain.    . metFORMIN (GLUCOPHAGE) 500 MG tablet Take 1,000 mg by mouth 2 (two) times daily with a meal.     . metoprolol succinate (TOPROL-XL) 50 MG 24 hr tablet Take 1 tablet (50 mg total) by mouth daily. Take with or immediately following a meal. (Patient  taking differently: Take 25 mg by mouth every evening. Take with or immediately following a meal.) 30 tablet 3  . rosuvastatin (CRESTOR) 10 MG tablet Take 10 mg by mouth every evening.     . sacubitril-valsartan (ENTRESTO) 97-103 MG Take 1 tablet by mouth 2 (two) times daily.    Marland Kitchen spironolactone (ALDACTONE) 25 MG tablet Take 1 tablet (25 mg total) by mouth 2 (two) times daily. (Patient taking differently: Take 25 mg by mouth every evening. ) 180 tablet 3  . amLODipine (NORVASC) 5 MG tablet Take 1 tablet (5 mg total) by mouth daily. (Patient not taking: Reported on  10/12/2017) 90 tablet 3  . atorvastatin (LIPITOR) 80 MG tablet Take 1 tablet (80 mg total) by mouth daily. (Patient not taking: Reported on 10/12/2017) 90 tablet 3  . ivabradine ([name]) 7.5 MG TABS tablet Take 1 tablet (7.5 mg total) by mouth 2 (two) times daily with a meal. (Patient not taking: Reported on 10/12/2017) 60 tablet 3  . KOMBIGLYZE XR 2.11-998 MG TB24 Take 1 tablet by mouth 2 (two) times daily.  0  . ramipril (ALTACE) 10 MG capsule Take 1 capsule (10 mg total) by mouth daily. (Patient not taking: Reported on 10/12/2017) 90 capsule 3    Home: Home Living Family/patient expects to be discharged to:: Private residence Living Arrangements: Alone Type of Home: House Home Access: Stairs to enter Technical brewer of Steps: 2 Entrance Stairs-Rails: Right, Left, Can reach both Home Layout: One level Home Equipment: None  Functional History: Prior Function Level of Independence: Independent Functional Status:  Mobility: Bed Mobility Overal bed mobility: Needs Assistance Bed Mobility: Supine to Sit Supine to sit: Min guard, HOB elevated General bed mobility comments: Incr time and effort  Transfers Overall transfer level: Needs assistance Equipment used: Rolling walker (2 wheeled) Transfers: Sit to/from Stand, [employer] Transfers Sit to Stand: Mod assist Stand pivot transfers: Min assist General transfer comment: Assist to bring hips up and for balance. Verbal cues for hand placement. Once standing with walker min assist for balance for pivot to chair. Pt pivoting on LLE and not able to achieve hopping. Ambulation/Gait General Gait Details: Unable    ADL:    Cognition: Cognition Overall Cognitive Status: Within Functional Limits for tasks assessed Orientation Level: Oriented X4 Cognition Arousal/Alertness: Awake/alert Behavior During Therapy: WFL for tasks assessed/performed Overall Cognitive Status: Within Functional Limits for tasks assessed   Blood  pressure 108/69, pulse 86, temperature 98.1 F (36.7 C), temperature source Oral, resp. rate 20, height 6' (1.829 m), weight 104.7 kg (230 lb 13.2 oz), SpO2 97 %. Physical Exam  Nursing note and vitals reviewed. Constitutional: He is oriented to person, place, and time. He appears well-developed and well-nourished. No distress.  HENT:  Head: Normocephalic and atraumatic.  Mouth/Throat: Oropharynx is clear and moist.  Eyes: Pupils are equal, round, and reactive to light. Conjunctivae and EOM are normal. Right eye exhibits no discharge. Left eye exhibits no discharge.  Neck: Normal range of motion. Neck supple.  Cardiovascular: Normal rate and regular rhythm.  No murmur heard. Respiratory: Effort normal and breath sounds normal. No stridor. No respiratory distress. He has no wheezes.  GI: Soft. Bowel sounds are normal. He exhibits no distension. There is no tenderness.  Musculoskeletal: He exhibits no tenderness.  R-BKA covered with Flo tech device. VAC underneath with bloody drainage.   Neurological: He is alert and oriented to person, place, and time.  Skin: Skin is warm and dry. He is not diaphoretic.  Psychiatric: He has a normal mood and affect. His behavior is normal. Judgment and thought content normal.  Left foot no lesions Left foot has decreased sensation to light touch in the toes intact at the ankle Motor strength is 5/5 bilateral biceps triceps deltoid grip, 4+ left hip flexor knee extensor ankle dorsiflexor.  4 at the right hip flexor  Results for orders placed or performed during the hospital encounter of 10/12/17 (from the past 24 hour(s))  Glucose, capillary     Status: Abnormal   Collection Time: 10/18/17  2:49 PM  Result Value Ref Range   Glucose-Capillary 137 (H) 65 - 99 mg/dL  Glucose, capillary     Status: Abnormal   Collection Time: 10/18/17  5:10 PM  Result Value Ref Range   Glucose-Capillary 210 (H) 65 - 99 mg/dL  Glucose, capillary     Status: Abnormal    Collection Time: 10/18/17  8:41 PM  Result Value Ref Range   Glucose-Capillary 231 (H) 65 - 99 mg/dL  Glucose, capillary     Status: Abnormal   Collection Time: 10/18/17 10:12 PM  Result Value Ref Range   Glucose-Capillary 305 (H) 65 - 99 mg/dL   Comment 1 Notify RN   CBC with Differential/Platelet     Status: Abnormal   Collection Time: 10/19/17  4:39 AM  Result Value Ref Range   WBC 15.6 (H) 4.0 - 10.5 K/uL   RBC 3.87 (L) 4.22 - 5.81 MIL/uL   Hemoglobin 11.3 (L) 13.0 - 17.0 g/dL   HCT 36.0 (L) 39.0 - 52.0 %   MCV 93.0 78.0 - 100.0 fL   MCH 29.2 26.0 - 34.0 pg   MCHC 31.4 30.0 - 36.0 g/dL   RDW 14.7 11.5 - 15.5 %   Platelets 525 (H) 150 - 400 K/uL   Neutrophils Relative % 86 %   Lymphocytes Relative 8 %   Monocytes Relative 6 %   Eosinophils Relative 0 %   Basophils Relative 0 %   Band Neutrophils 0 %   Metamyelocytes Relative 0 %   Myelocytes 0 %   Promyelocytes Absolute 0 %   Blasts 0 %   nRBC 0 0 /100 WBC   Other 0 %   Neutro Abs 13.5 (H) 1.7 - 7.7 K/uL   Lymphs Abs 1.2 0.7 - 4.0 K/uL   Monocytes Absolute 0.9 0.1 - 1.0 K/uL   Eosinophils Absolute 0.0 0.0 - 0.7 K/uL   Basophils Absolute 0.0 0.0 - 0.1 K/uL  Basic metabolic panel     Status: Abnormal   Collection Time: 10/19/17  4:39 AM  Result Value Ref Range   Sodium 135 135 - 145 mmol/L   Potassium 5.4 (H) 3.5 - 5.1 mmol/L   Chloride 100 (L) 101 - 111 mmol/L   CO2 27 22 - 32 mmol/L   Glucose, Bld 366 (H) 65 - 99 mg/dL   BUN 19 6 - 20 mg/dL   Creatinine, Ser 0.98 0.61 - 1.24 mg/dL   Calcium 8.7 (L) 8.9 - 10.3 mg/dL   GFR calc non Af Amer >60 >60 mL/min   GFR calc Af Amer >60 >60 mL/min   Anion gap 8 5 - 15  Magnesium     Status: None   Collection Time: 10/19/17  4:39 AM  Result Value Ref Range   Magnesium 2.0 1.7 - 2.4 mg/dL  Glucose, capillary     Status: Abnormal   Collection Time: 10/19/17  7:34 AM  Result Value Ref Range  Glucose-Capillary 298 (H) 65 - 99 mg/dL  Glucose, capillary     Status:  Abnormal   Collection Time: 10/19/17 12:00 PM  Result Value Ref Range   Glucose-Capillary 271 (H) 65 - 99 mg/dL   No results found.  Assessment/Plan: Diagnosis: Right below-knee amputation secondary to right foot osteomyelitis 1. Does the need for close, 24 hr/day medical supervision in concert with the patient's rehab needs make it unreasonable for this patient to be served in a less intensive setting? Yes 2. Co-Morbidities requiring supervision/potential complications: Type 2 diabetes with peripheral neuropathy, recent hematochezia 3. Due to bladder management, bowel management, safety, skin/wound care, disease management, medication administration, pain management and patient education, does the patient require 24 hr/day rehab nursing? Yes 4. Does the patient require coordinated care of a physician, rehab nurse, PT (1-2 hrs/day, 5 days/week), OT (1-2 hrs/day, 5 days/week) and SLP (.5-1 hrs/day, 5 days/week) to address physical and functional deficits in the context of the above medical diagnosis(es)? Yes Addressing deficits in the following areas: balance, endurance, locomotion, strength, transferring, bowel/bladder control, bathing, dressing, feeding, grooming, toileting and psychosocial support 5. Can the patient actively participate in an intensive therapy program of at least 3 hrs of therapy per day at least 5 days per week? Yes 6. The potential for patient to make measurable gains while on inpatient rehab is excellent 7. Anticipated functional outcomes upon discharge from inpatient rehab are modified independent  with PT, modified independent with OT, n/a with SLP. 8. Estimated rehab length of stay to reach the above functional goals is: 8-14 days 9. Anticipated D/C setting: Home 10. Anticipated post D/C treatments: [place] therapy 11. Overall Rehab/Functional Prognosis: good  RECOMMENDATIONS: This patient's condition is appropriate for continued rehabilitative care in the following  setting: CIR Patient has agreed to participate in recommended program. Yes Note that insurance prior authorization may be required for reimbursement for recommended care.  Comment:   Charlett Blake M.D. [facility] FAAPM&R (Sports Med, Neuromuscular Med) Diplomate Am Board of [place], PA-C 10/19/2017

## 2017-10-19 NOTE — Evaluation (Signed)
Physical Therapy Evaluation Patient Details Name: Brian Herring MRN: [id number] DOB: [birth year] Today's Date: 10/19/2017   History of Present Illness  Pt adm with rt foot ulcer with infection and now s/p rt BKA on 3/27. PMH - HTN, DM, CHF  Clinical Impression  Pt presents to PT with decr mobility after Rt BKA. Pt very motivated to return to independence and demonstrates excellent problem solving skills. Feel pt is excellent candidate for CIR and that at conclusion of CIR stay he would be able to return to modified independent at w/c level at home as well as some short distance ambulation with supervision/assist.    Follow Up Recommendations CIR    Equipment Recommendations  Rolling walker with 5" wheels;3in1 (PT);Wheelchair (measurements PT);Wheelchair cushion (measurements PT)    Recommendations for Other Services       Precautions / Restrictions Precautions Precautions: Fall      Mobility  Bed Mobility Overal bed mobility: Needs Assistance Bed Mobility: Supine to Sit     Supine to sit: Min guard;HOB elevated     General bed mobility comments: Incr time and effort   Transfers Overall transfer level: Needs assistance Equipment used: Rolling walker (2 wheeled) Transfers: Sit to/from [employer] Sit to Stand: Mod assist Stand pivot transfers: Min assist       General transfer comment: Assist to bring hips up and for balance. Verbal cues for hand placement. Once standing with walker min assist for balance for pivot to chair. Pt pivoting on LLE and not able to achieve hopping.  Ambulation/Gait             General Gait Details: Unable  Stairs            Wheelchair Mobility    Modified Rankin (Stroke Patients Only)       Balance Overall balance assessment: Needs assistance Sitting-balance support: No upper extremity supported;Feet supported Sitting balance-Leahy Scale: Good     Standing balance support: Bilateral upper extremity  supported Standing balance-Leahy Scale: Poor Standing balance comment: walker and min assist for static standing                             Pertinent Vitals/Pain Pain Assessment: Faces Faces Pain Scale: Hurts a little bit Pain Location: RLE Pain Descriptors / Indicators: Operative site guarding Pain Intervention(s): Limited activity within patient's tolerance;Monitored during session    Home Living Family/patient expects to be discharged to:: Private residence Living Arrangements: Alone   Type of Home: House Home Access: Stairs to enter Entrance Stairs-Rails: Right;Left;Can reach both Technical brewer of Steps: 2 Home Layout: One level Home Equipment: None      Prior Function Level of Independence: Independent               Hand Dominance        Extremity/Trunk Assessment   Upper Extremity Assessment Upper Extremity Assessment: Overall WFL for tasks assessed    Lower Extremity Assessment Lower Extremity Assessment: RLE deficits/detail RLE Deficits / Details: BKA, AROM WFL, Strength at least 3/5       Communication   Communication: No difficulties  Cognition Arousal/Alertness: Awake/alert Behavior During Therapy: WFL for tasks assessed/performed Overall Cognitive Status: Within Functional Limits for tasks assessed  General Comments      Exercises Amputee Exercises Knee Flexion: Right;5 reps;AROM;Supine Knee Extension: AROM;Right;5 reps;Supine Straight Leg Raises: AROM;Right;5 reps;Supine   Assessment/Plan    PT Assessment Patient needs continued PT services  PT Problem List Decreased strength;Decreased balance;Decreased mobility;Decreased knowledge of use of DME;Decreased knowledge of precautions       PT Treatment Interventions DME instruction;Gait training;Functional mobility training;Therapeutic activities;Therapeutic exercise;Balance training;Patient/family education     PT Goals (Current goals can be found in the Care Plan section)  Acute Rehab PT Goals Patient Stated Goal: Pt wants to return to independence and back to doing what he likes to do PT Goal Formulation: With patient Time For Goal Achievement: 11/02/17 Potential to Achieve Goals: Good    Frequency Min 3X/week   Barriers to discharge Inaccessible home environment;Decreased caregiver support Lives alone and stairs to enter    Co-evaluation               AM-PAC PT "6 Clicks" Daily Activity  Outcome Measure Difficulty turning over in bed (including adjusting bedclothes, sheets and blankets)?: A Little Difficulty moving from lying on back to sitting on the side of the bed? : A Little Difficulty sitting down on and standing up from a chair with arms (e.g., wheelchair, bedside commode, etc,.)?: Unable Help needed moving to and from a bed to chair (including a wheelchair)?: A Lot Help needed walking in hospital room?: Total Help needed climbing 3-5 steps with a railing? : Total 6 Click Score: 11    End of Session Equipment Utilized During Treatment: Gait belt Activity Tolerance: Patient tolerated treatment well Patient left: in chair;with call bell/phone within reach;with chair alarm set Nurse Communication: Mobility status PT Visit Diagnosis: Unsteadiness on feet (R26.81);Other abnormalities of gait and mobility (R26.89)    Time: 4982-6415 PT Time Calculation (min) (ACUTE ONLY): 24 min   Charges:   PT Evaluation $PT Eval Moderate Complexity: 1 Mod PT Treatments $Therapeutic Activity: 8-22 mins   PT G Codes:        [facility] PT [place] 10/19/2017, 2:15 PM

## 2017-10-19 NOTE — Progress Notes (Signed)
Results for Guin, TYWAUN HILTNER (MRN [id number]) as of 10/19/2017 10:25  Ref. Range 10/18/2017 14:49 10/18/2017 17:10 10/18/2017 20:41 10/18/2017 22:12 10/19/2017 07:34  Glucose-Capillary Latest Ref Range: 65 - 99 mg/dL 137 (H) 210 (H) 231 (H) 305 (H) 298 (H)  Noted that blood sugars have been greater than 250 mg/dl. Recommend adding Lantus 12 units daily if blood sugars continue to be greater than 180 mg/dl and while on steroids.  Harvel Ricks RN BSN CDE Diabetes Coordinator Pager: [phone]  8am-5pm

## 2017-10-19 NOTE — Progress Notes (Signed)
Patient has selected [employer]. They will submit insurance authorization once PT has seen patient.   Brian Locus Ronel Rodeheaver LCSW [phone]

## 2017-10-19 NOTE — Progress Notes (Signed)
Rehab Admissions Coordinator Note:  Patient was screened by Cleatrice Burke for appropriateness for an Inpatient Acute Rehab Consult per PT recommendation.  At this time, we are recommending Inpatient Rehab consult. I contacted MD for order and requested OT eval to be done asap.  Cleatrice Burke 10/19/2017, 2:22 PM  I can be reached at [phone].

## 2017-10-20 ENCOUNTER — Inpatient Hospital Stay (HOSPITAL_COMMUNITY)
Admission: RE | Admit: 2017-10-20 | Discharge: 2017-10-31 | DRG: 560 | Disposition: A | Discharge status: Home Health | Payer: BLUE CROSS/BLUE SHIELD | Source: Intra-hospital | Attending: Physical Medicine & Rehabilitation | Admitting: Physical Medicine & Rehabilitation

## 2017-10-20 ENCOUNTER — Encounter (HOSPITAL_COMMUNITY): Payer: Self-pay | Admitting: *Deleted

## 2017-10-20 ENCOUNTER — Other Ambulatory Visit: Payer: Self-pay

## 2017-10-20 DIAGNOSIS — E46 Unspecified protein-calorie malnutrition: Secondary | ICD-10-CM

## 2017-10-20 DIAGNOSIS — Z7984 Long term (current) use of oral hypoglycemic drugs: Secondary | ICD-10-CM

## 2017-10-20 DIAGNOSIS — R319 Hematuria, unspecified: Secondary | ICD-10-CM | POA: Diagnosis not present

## 2017-10-20 DIAGNOSIS — N401 Enlarged prostate with lower urinary tract symptoms: Secondary | ICD-10-CM | POA: Diagnosis present

## 2017-10-20 DIAGNOSIS — Z882 Allergy status to sulfonamides status: Secondary | ICD-10-CM

## 2017-10-20 DIAGNOSIS — I5022 Chronic systolic (congestive) heart failure: Secondary | ICD-10-CM

## 2017-10-20 DIAGNOSIS — Z89511 Acquired absence of right leg below knee: Secondary | ICD-10-CM | POA: Diagnosis not present

## 2017-10-20 DIAGNOSIS — Z79899 Other long term (current) drug therapy: Secondary | ICD-10-CM | POA: Diagnosis not present

## 2017-10-20 DIAGNOSIS — Z791 Long term (current) use of non-steroidal anti-inflammatories (NSAID): Secondary | ICD-10-CM

## 2017-10-20 DIAGNOSIS — R338 Other retention of urine: Secondary | ICD-10-CM | POA: Diagnosis present

## 2017-10-20 DIAGNOSIS — D72829 Elevated white blood cell count, unspecified: Secondary | ICD-10-CM | POA: Diagnosis not present

## 2017-10-20 DIAGNOSIS — E1165 Type 2 diabetes mellitus with hyperglycemia: Secondary | ICD-10-CM

## 2017-10-20 DIAGNOSIS — M869 Osteomyelitis, unspecified: Secondary | ICD-10-CM

## 2017-10-20 DIAGNOSIS — E871 Hypo-osmolality and hyponatremia: Secondary | ICD-10-CM | POA: Diagnosis present

## 2017-10-20 DIAGNOSIS — I110 Hypertensive heart disease with heart failure: Secondary | ICD-10-CM | POA: Diagnosis present

## 2017-10-20 DIAGNOSIS — E8809 Other disorders of plasma-protein metabolism, not elsewhere classified: Secondary | ICD-10-CM | POA: Diagnosis present

## 2017-10-20 DIAGNOSIS — R339 Retention of urine, unspecified: Secondary | ICD-10-CM | POA: Diagnosis not present

## 2017-10-20 DIAGNOSIS — Y846 Urinary catheterization as the cause of abnormal reaction of the patient, or of later complication, without mention of misadventure at the time of the procedure: Secondary | ICD-10-CM | POA: Diagnosis not present

## 2017-10-20 DIAGNOSIS — L97509 Non-pressure chronic ulcer of other part of unspecified foot with unspecified severity: Secondary | ICD-10-CM

## 2017-10-20 DIAGNOSIS — E785 Hyperlipidemia, unspecified: Secondary | ICD-10-CM | POA: Diagnosis present

## 2017-10-20 DIAGNOSIS — E11621 Type 2 diabetes mellitus with foot ulcer: Secondary | ICD-10-CM

## 2017-10-20 DIAGNOSIS — I2510 Atherosclerotic heart disease of native coronary artery without angina pectoris: Secondary | ICD-10-CM | POA: Diagnosis present

## 2017-10-20 DIAGNOSIS — S88119A Complete traumatic amputation at level between knee and ankle, unspecified lower leg, initial encounter: Secondary | ICD-10-CM | POA: Diagnosis present

## 2017-10-20 DIAGNOSIS — Z4781 Encounter for orthopedic aftercare following surgical amputation: Principal | ICD-10-CM

## 2017-10-20 DIAGNOSIS — Z6830 Body mass index (BMI) 30.0-30.9, adult: Secondary | ICD-10-CM

## 2017-10-20 DIAGNOSIS — D62 Acute posthemorrhagic anemia: Secondary | ICD-10-CM | POA: Diagnosis present

## 2017-10-20 DIAGNOSIS — E669 Obesity, unspecified: Secondary | ICD-10-CM | POA: Diagnosis present

## 2017-10-20 DIAGNOSIS — S88119S Complete traumatic amputation at level between knee and ankle, unspecified lower leg, sequela: Secondary | ICD-10-CM | POA: Diagnosis not present

## 2017-10-20 DIAGNOSIS — R0982 Postnasal drip: Secondary | ICD-10-CM | POA: Diagnosis present

## 2017-10-20 DIAGNOSIS — E1169 Type 2 diabetes mellitus with other specified complication: Secondary | ICD-10-CM

## 2017-10-20 LAB — BASIC METABOLIC PANEL
Anion gap: 5 (ref 5–15)
BUN: 21 mg/dL — AB (ref 6–20)
CHLORIDE: 102 mmol/L (ref 101–111)
CO2: 26 mmol/L (ref 22–32)
Calcium: 8.2 mg/dL — ABNORMAL LOW (ref 8.9–10.3)
Creatinine, Ser: 0.81 mg/dL (ref 0.61–1.24)
GFR calc Af Amer: >60 mL/min (ref >60)
GFR calc non Af Amer: >60 mL/min (ref >60)
Glucose, Bld: 226 mg/dL — ABNORMAL HIGH (ref 65–99)
POTASSIUM: 4.4 mmol/L (ref 3.5–5.1)
SODIUM: 133 mmol/L — AB (ref 135–145)

## 2017-10-20 LAB — GLUCOSE, CAPILLARY
GLUCOSE-CAPILLARY: 183 mg/dL — AB (ref 65–99)
Glucose-Capillary: 229 mg/dL — ABNORMAL HIGH (ref 65–99)
Glucose-Capillary: 176 mg/dL — ABNORMAL HIGH (ref 65–99)
Glucose-Capillary: 191 mg/dL — ABNORMAL HIGH (ref 65–99)

## 2017-10-20 LAB — CBC WITH DIFFERENTIAL/PLATELET
Basophils Absolute: 0.1 10*3/uL (ref 0.0–0.1)
Basophils Relative: 0 %
EOS ABS: 0.1 10*3/uL (ref 0.0–0.7)
Eosinophils Relative: 1 %
HCT: 33 % — ABNORMAL LOW (ref 39.0–52.0)
HEMOGLOBIN: 10.3 g/dL — AB (ref 13.0–17.0)
LYMPHS PCT: 24 %
Lymphs Abs: 3.1 10*3/uL (ref 0.7–4.0)
MCH: 29.1 pg (ref 26.0–34.0)
MCHC: 31.2 g/dL (ref 30.0–36.0)
MCV: 93.2 fL (ref 78.0–100.0)
Monocytes Absolute: 0.8 10*3/uL (ref 0.1–1.0)
Monocytes Relative: 6 %
NEUTROS ABS: 8.8 10*3/uL — AB (ref 1.7–7.7)
NEUTROS PCT: 69 %
Platelets: 469 10*3/uL — ABNORMAL HIGH (ref 150–400)
RBC: 3.54 MIL/uL — AB (ref 4.22–5.81)
RDW: 14.9 % (ref 11.5–15.5)
WBC: 12.9 10*3/uL — AB (ref 4.0–10.5)

## 2017-10-20 LAB — MAGNESIUM: MAGNESIUM: 2 mg/dL (ref 1.7–2.4)

## 2017-10-20 MED ORDER — SPIRONOLACTONE 25 MG PO TABS: 25.0000 mg | ORAL_TABLET | Freq: Every evening | ORAL | Status: DC

## 2017-10-20 MED ORDER — LORATADINE 10 MG PO TABS
10.0000 mg | ORAL_TABLET | Freq: Every day | ORAL | Status: DC
Start: 2017-10-21 — End: 2017-10-31
  Administered 2017-10-21 – 2017-10-31 (×11): 10 mg via ORAL
  Filled 2017-10-20 (×11): qty 1

## 2017-10-20 MED ORDER — DIPHENHYDRAMINE HCL 12.5 MG/5ML PO ELIX: 12.5000 mg | ORAL_SOLUTION | Freq: Four times a day (QID) | ORAL | Status: DC | PRN

## 2017-10-20 MED ORDER — INSULIN GLARGINE 100 UNIT/ML ~~LOC~~ SOLN: 15.0000 [IU] | Freq: Every day | SUBCUTANEOUS | Status: DC

## 2017-10-20 MED ORDER — ATORVASTATIN CALCIUM 80 MG PO TABS
80.0000 mg | ORAL_TABLET | Freq: Every day | ORAL | Status: DC
  Administered 2017-10-21 – 2017-10-31 (×11): 80 mg via ORAL
  Filled 2017-10-20 (×11): qty 1

## 2017-10-20 MED ORDER — GUAIFENESIN-DM 100-10 MG/5ML PO SYRP: 5.0000 mL | ORAL_SOLUTION | Freq: Four times a day (QID) | ORAL | Status: DC | PRN

## 2017-10-20 MED ORDER — PROCHLORPERAZINE 25 MG RE SUPP: 12.5000 mg | Freq: Four times a day (QID) | RECTAL | Status: DC | PRN

## 2017-10-20 MED ORDER — DOCUSATE SODIUM 100 MG PO CAPS
100.0000 mg | ORAL_CAPSULE | Freq: Two times a day (BID) | ORAL | Status: DC
  Administered 2017-10-21 – 2017-10-31 (×17): 100 mg via ORAL
  Filled 2017-10-20 (×21): qty 1

## 2017-10-20 MED ORDER — PRO-STAT SUGAR FREE PO LIQD
30.0000 mL | Freq: Two times a day (BID) | ORAL | Status: DC
  Administered 2017-10-20 – 2017-10-31 (×22): 30 mL via ORAL
  Filled 2017-10-20 (×21): qty 30

## 2017-10-20 MED ORDER — TRAZODONE HCL 50 MG PO TABS: 25.0000 mg | ORAL_TABLET | Freq: Every evening | ORAL | Status: DC | PRN

## 2017-10-20 MED ORDER — INSULIN ASPART 100 UNIT/ML ~~LOC~~ SOLN
5.0000 [IU] | Freq: Three times a day (TID) | SUBCUTANEOUS | Status: DC
  Administered 2017-10-20 (×2): 5 [IU] via SUBCUTANEOUS

## 2017-10-20 MED ORDER — PRO-STAT SUGAR FREE PO LIQD: 30.0000 mL | Freq: Two times a day (BID) | ORAL | Status: DC

## 2017-10-20 MED ORDER — INSULIN ASPART 100 UNIT/ML ~~LOC~~ SOLN
5.0000 [IU] | Freq: Three times a day (TID) | SUBCUTANEOUS | Status: DC
  Administered 2017-10-20 – 2017-10-23 (×10): 5 [IU] via SUBCUTANEOUS

## 2017-10-20 MED ORDER — INSULIN ASPART 100 UNIT/ML ~~LOC~~ SOLN
0.0000 [IU] | Freq: Three times a day (TID) | SUBCUTANEOUS | Status: DC
  Administered 2017-10-20: 2 [IU] via SUBCUTANEOUS
  Administered 2017-10-21: 3 [IU] via SUBCUTANEOUS
  Administered 2017-10-21: 2 [IU] via SUBCUTANEOUS
  Administered 2017-10-21: 1 [IU] via SUBCUTANEOUS
  Administered 2017-10-22: 2 [IU] via SUBCUTANEOUS
  Administered 2017-10-22 (×2): 3 [IU] via SUBCUTANEOUS
  Administered 2017-10-23: 2 [IU] via SUBCUTANEOUS
  Administered 2017-10-23: 3 [IU] via SUBCUTANEOUS
  Administered 2017-10-23: 5 [IU] via SUBCUTANEOUS
  Administered 2017-10-24 – 2017-10-25 (×4): 1 [IU] via SUBCUTANEOUS
  Administered 2017-10-25: 2 [IU] via SUBCUTANEOUS
  Administered 2017-10-26: 7 [IU] via SUBCUTANEOUS
  Administered 2017-10-26: 1 [IU] via SUBCUTANEOUS
  Administered 2017-10-27: 2 [IU] via SUBCUTANEOUS
  Administered 2017-10-27: 3 [IU] via SUBCUTANEOUS
  Administered 2017-10-28 – 2017-10-29 (×3): 1 [IU] via SUBCUTANEOUS
  Administered 2017-10-29 – 2017-10-30 (×2): 3 [IU] via SUBCUTANEOUS
  Administered 2017-10-30 – 2017-10-31 (×3): 1 [IU] via SUBCUTANEOUS

## 2017-10-20 MED ORDER — POLYETHYLENE GLYCOL 3350 17 G PO PACK: 17.0000 g | PACK | Freq: Every day | ORAL | Status: DC | PRN

## 2017-10-20 MED ORDER — INSULIN ASPART 100 UNIT/ML ~~LOC~~ SOLN
0.0000 [IU] | Freq: Every day | SUBCUTANEOUS | Status: DC
  Administered 2017-10-21: 2 [IU] via SUBCUTANEOUS
  Administered 2017-10-22: 3 [IU] via SUBCUTANEOUS
  Administered 2017-10-23: 2 [IU] via SUBCUTANEOUS
  Administered 2017-10-24: 3 [IU] via SUBCUTANEOUS
  Administered 2017-10-26: 2 [IU] via SUBCUTANEOUS

## 2017-10-20 MED ORDER — PROCHLORPERAZINE EDISYLATE 5 MG/ML IJ SOLN: 5.0000 mg | Freq: Four times a day (QID) | INTRAMUSCULAR | Status: DC | PRN

## 2017-10-20 MED ORDER — METHOCARBAMOL 500 MG PO TABS: 500.0000 mg | ORAL_TABLET | Freq: Four times a day (QID) | ORAL | Status: DC | PRN

## 2017-10-20 MED ORDER — INSULIN GLARGINE 100 UNIT/ML ~~LOC~~ SOLN
15.0000 [IU] | Freq: Every day | SUBCUTANEOUS | Status: DC
  Administered 2017-10-20 – 2017-10-21 (×2): 15 [IU] via SUBCUTANEOUS
  Filled 2017-10-20 (×2): qty 0.15

## 2017-10-20 MED ORDER — DOCUSATE SODIUM 100 MG PO CAPS: 100.0000 mg | ORAL_CAPSULE | Freq: Two times a day (BID) | ORAL | Status: DC

## 2017-10-20 MED ORDER — INSULIN ASPART 100 UNIT/ML ~~LOC~~ SOLN: 0.0000 [IU] | Freq: Every day | SUBCUTANEOUS | Status: DC

## 2017-10-20 MED ORDER — METOPROLOL SUCCINATE ER 25 MG PO TB24: 25.0000 mg | ORAL_TABLET | Freq: Every evening | ORAL | Status: DC

## 2017-10-20 MED ORDER — PROCHLORPERAZINE MALEATE 5 MG PO TABS: 5.0000 mg | ORAL_TABLET | Freq: Four times a day (QID) | ORAL | Status: DC | PRN

## 2017-10-20 MED ORDER — INSULIN ASPART 100 UNIT/ML ~~LOC~~ SOLN: 5.0000 [IU] | Freq: Three times a day (TID) | SUBCUTANEOUS | Status: DC

## 2017-10-20 MED ORDER — ENOXAPARIN SODIUM 40 MG/0.4ML ~~LOC~~ SOLN
40.0000 mg | SUBCUTANEOUS | Status: DC
  Administered 2017-10-20 – 2017-10-21 (×2): 40 mg via SUBCUTANEOUS
  Filled 2017-10-20 (×2): qty 0.4

## 2017-10-20 MED ORDER — SACCHAROMYCES BOULARDII 250 MG PO CAPS
250.0000 mg | ORAL_CAPSULE | Freq: Two times a day (BID) | ORAL | Status: DC
  Administered 2017-10-20 – 2017-10-31 (×20): 250 mg via ORAL
  Filled 2017-10-20 (×23): qty 1

## 2017-10-20 MED ORDER — METOPROLOL SUCCINATE ER 25 MG PO TB24
25.0000 mg | ORAL_TABLET | Freq: Every evening | ORAL | Status: DC
  Administered 2017-10-20 – 2017-10-30 (×9): 25 mg via ORAL
  Filled 2017-10-20 (×10): qty 1

## 2017-10-20 MED ORDER — INSULIN ASPART 100 UNIT/ML ~~LOC~~ SOLN: 0.0000 [IU] | Freq: Three times a day (TID) | SUBCUTANEOUS | Status: DC

## 2017-10-20 MED ORDER — OXYCODONE HCL 5 MG PO TABS
5.0000 mg | ORAL_TABLET | ORAL | Status: DC | PRN
  Administered 2017-10-20 – 2017-10-21 (×2): 10 mg via ORAL
  Administered 2017-10-21: 5 mg via ORAL
  Administered 2017-10-21: 10 mg via ORAL
  Administered 2017-10-22: 5 mg via ORAL
  Administered 2017-10-23 – 2017-10-24 (×4): 10 mg via ORAL
  Administered 2017-10-25: 5 mg via ORAL
  Administered 2017-10-25 – 2017-10-26 (×3): 10 mg via ORAL
  Administered 2017-10-26: 5 mg via ORAL
  Administered 2017-10-27 – 2017-10-30 (×8): 10 mg via ORAL
  Filled 2017-10-20: qty 1
  Filled 2017-10-20 (×6): qty 2
  Filled 2017-10-20: qty 1
  Filled 2017-10-20 (×2): qty 2
  Filled 2017-10-20: qty 1
  Filled 2017-10-20 (×9): qty 2
  Filled 2017-10-20: qty 1
  Filled 2017-10-20 (×2): qty 2
  Filled 2017-10-20: qty 1

## 2017-10-20 MED ORDER — INSULIN GLARGINE 100 UNIT/ML ~~LOC~~ SOLN
15.0000 [IU] | Freq: Every day | SUBCUTANEOUS | Status: DC
  Filled 2017-10-20: qty 0.15

## 2017-10-20 MED ORDER — OXYCODONE HCL 5 MG PO TABS: 5.0000 mg | ORAL_TABLET | ORAL | Status: DC | PRN

## 2017-10-20 MED ORDER — LORATADINE 10 MG PO TABS: 10.0000 mg | ORAL_TABLET | Freq: Every day | ORAL | Status: DC

## 2017-10-20 MED ORDER — FLEET ENEMA 7-19 GM/118ML RE ENEM: 1.0000 | ENEMA | Freq: Once | RECTAL | Status: DC | PRN

## 2017-10-20 MED ORDER — ALUM & MAG HYDROXIDE-SIMETH 200-200-20 MG/5ML PO SUSP: 30.0000 mL | ORAL | Status: DC | PRN

## 2017-10-20 MED ORDER — METHOCARBAMOL 500 MG PO TABS
500.0000 mg | ORAL_TABLET | Freq: Four times a day (QID) | ORAL | Status: DC | PRN
  Administered 2017-10-21 – 2017-10-29 (×7): 500 mg via ORAL
  Filled 2017-10-20 (×7): qty 1

## 2017-10-20 MED ORDER — BISACODYL 10 MG RE SUPP: 10.0000 mg | Freq: Every day | RECTAL | Status: DC | PRN

## 2017-10-20 MED ORDER — ACETAMINOPHEN 325 MG PO TABS
325.0000 mg | ORAL_TABLET | ORAL | Status: DC | PRN
  Administered 2017-10-20 – 2017-10-29 (×5): 650 mg via ORAL
  Filled 2017-10-20 (×5): qty 2

## 2017-10-20 MED ORDER — SALINE SPRAY 0.65 % NA SOLN
1.0000 | NASAL | Status: DC | PRN
  Filled 2017-10-20: qty 44

## 2017-10-20 MED ORDER — SACUBITRIL-VALSARTAN 97-103 MG PO TABS
1.0000 | ORAL_TABLET | Freq: Two times a day (BID) | ORAL | Status: DC
Start: 2017-10-20 — End: 2017-10-31
  Administered 2017-10-20 – 2017-10-31 (×22): 1 via ORAL
  Filled 2017-10-20 (×22): qty 1

## 2017-10-20 NOTE — H&P (Signed)
Physical Medicine and Rehabilitation Admission H&P     Chief Complaint  Patient presents with  . Functional deficits due to BKA  HPI: Brian Herring is a 62 y.o. male with history of T2DM, HTN, chronic systolic CHF who was admitted on 10/12/17 with infected foot ulcer with osteomyelitis. He was started on IV antibiotics and underwent right transtibial amputation. He has had issues with bloody stools felt to be due to ASA and Sq heparin. H/H stable. Mild hyponateremia noted and reactive leucocytosis resolving. Pain has been reasonably controlled. Therapy evaluations done today and CIR recommended due to functional deficits.   Review of Systems  Constitutional: Negative for chills and fever.  HENT: Negative for hearing loss and tinnitus.  Eyes: Negative for blurred vision and double vision.  Respiratory: Negative for cough.  Post nasal drip in mornings.  Cardiovascular: Negative for chest pain and palpitations.  Gastrointestinal: Negative for constipation, heartburn and nausea.  Genitourinary: Negative for dysuria and urgency.  Musculoskeletal: Negative for myalgias and neck pain.  Skin: Negative for rash.  Neurological: Positive for sensory change. Negative for focal weakness.  Psychiatric/Behavioral: Negative for depression. The patient does not have insomnia.       Past Medical History:  Diagnosis Date  . Chronic systolic CHF (congestive heart failure) ([place])   . Diabetes mellitus without complication ([place])   . Hyperlipidemia   . Hypertension         Past Surgical History:  Procedure Laterality Date  . AMPUTATION Right 10/14/2017   Procedure: AMPUTATION GREAT TOE; Surgeon: [name], MD; Location: [place]; Service: Orthopedics; Laterality: Right;  . AMPUTATION Right 10/18/2017   Procedure: RIGHT BELOW KNEE AMPUTATION; Surgeon: [name], MD; Location: [place]; Service: Orthopedics; Laterality: Right;  . SHOULDER SURGERY  01/05        Family History  Problem Relation Age  of Onset  . Pneumonia Mother    aspiration   Social History: Lives alone. Disabled. Independent without AD. Has friends who can check in after discharge. He reports that he has never smoked. He has never used smokeless tobacco. He reports that he does not drink alcohol. His drug history is not on file.      Allergies  Allergen Reactions  . Sulfur Rash         Medications Prior to Admission  Medication Sig Dispense Refill  . acarbose (PRECOSE) 25 MG tablet Take 1 tablet by mouth 3 (three) times daily with meals.     Marland Kitchen aspirin 81 MG tablet Take 81 mg by mouth every morning.     . Empagliflozin (JARDIANCE PO) Take 25 mg by mouth every morning.     . furosemide (LASIX) 40 MG tablet Take 40 mg by mouth daily as needed.     Marland Kitchen glimepiride (AMARYL) 4 MG tablet Take 1 tablet by mouth every morning.   1  . ibuprofen (ADVIL,MOTRIN) 200 MG tablet Take 200 mg by mouth every 6 (six) hours as needed for fever, headache, mild pain or moderate pain.    . metFORMIN (GLUCOPHAGE) 500 MG tablet Take 1,000 mg by mouth 2 (two) times daily with a meal.     . metoprolol succinate (TOPROL-XL) 50 MG 24 hr tablet Take 1 tablet (50 mg total) by mouth daily. Take with or immediately following a meal. (Patient taking differently: Take 25 mg by mouth every evening. Take with or immediately following a meal.) 30 tablet 3  . rosuvastatin (CRESTOR) 10 MG tablet Take 10 mg by  mouth every evening.     . sacubitril-valsartan (ENTRESTO) 97-103 MG Take 1 tablet by mouth 2 (two) times daily.    . [DISCONTINUED] spironolactone (ALDACTONE) 25 MG tablet Take 1 tablet (25 mg total) by mouth 2 (two) times daily. (Patient taking differently: Take 25 mg by mouth every evening. ) 180 tablet 3  . amLODipine (NORVASC) 5 MG tablet Take 1 tablet (5 mg total) by mouth daily. (Patient not taking: Reported on 10/12/2017) 90 tablet 3  . atorvastatin (LIPITOR) 80 MG tablet Take 1 tablet (80 mg total) by mouth daily. (Patient not taking: Reported on  10/12/2017) 90 tablet 3  . ivabradine ([name]) 7.5 MG TABS tablet Take 1 tablet (7.5 mg total) by mouth 2 (two) times daily with a meal. (Patient not taking: Reported on 10/12/2017) 60 tablet 3  . KOMBIGLYZE XR 2.11-998 MG TB24 Take 1 tablet by mouth 2 (two) times daily.  0  . ramipril (ALTACE) 10 MG capsule Take 1 capsule (10 mg total) by mouth daily. (Patient not taking: Reported on 10/12/2017) 90 capsule 3   Drug Regimen Review  Drug regimen was reviewed and remains appropriate with no significant issues identified  Home:  Home Living  Family/patient expects to be discharged to:: Private residence  Living Arrangements: Alone  Available Help at Discharge: Friend(s), Available PRN/intermittently  Type of Home: House  Home Access: Stairs to enter  [employer] of Steps: 2  Entrance Stairs-Rails: Right, Left, Can reach both  Home Layout: One level  Bathroom Shower/Tub: Tourist information centre manager: Standard  Bathroom Accessibility: Yes  Home Equipment: Grab bars - tub/shower  Functional History:  Prior Function  Level of Independence: Independent  Comments: drives  Functional Status:  Mobility:  Bed Mobility  Overal bed mobility: Needs Assistance  Bed Mobility: Supine to Sit  Supine to sit: Supervision, HOB elevated  General bed mobility comments: Pt sitting in recliner on arrival.  Transfers  Overall transfer level: Needs assistance  Equipment used: Rolling walker (2 wheeled)  Transfers: Sit to/from Stand  Sit to Stand: Mod assist  Stand pivot transfers: Min assist  Lateral/Scoot Transfers: Min guard  General transfer comment: Cues for hand placement, assist to rise and steady. Pt required increased time and effort. Pt performed 4 min standing trial and 2 min standing trial.  Ambulation/Gait  Ambulation/Gait assistance: (NT remains to lack strength for hop to pattern. )  General Gait Details: Unable   ADL:  ADL  Overall ADL's : Needs assistance/impaired    Eating/Feeding: Independent, Bed level  Grooming: Set up, Sitting, Wash/dry hands, Wash/dry face, Oral care  Upper Body Bathing: Set up, Sitting  Lower Body Bathing: Moderate assistance, Sitting/lateral leans  Upper Body Dressing : Set up, Sitting  Lower Body Dressing: Moderate assistance, Sitting/lateral leans  Toilet Transfer: Moderate assistance, Stand-pivot, RW, BSC  Toileting- Clothing Manipulation and Hygiene: Moderate assistance, Sitting/lateral lean  Functional mobility during ADLs: (unable to hop with RW)  General ADL Comments: began educating pt in compensatory techniques for ADL and IADL until pt receives prosthesis  Cognition:  Cognition  Overall Cognitive Status: Within Functional Limits for tasks assessed  Orientation Level: Oriented X4  Cognition  Arousal/Alertness: Awake/alert  Behavior During Therapy: WFL for tasks assessed/performed  Overall Cognitive Status: Within Functional Limits for tasks assessed  Blood pressure 96/67, pulse (!) 45, temperature 97.6 F (36.4 C), resp. rate 19, height 6' (1.829 m), weight 104.7 kg (230 lb 13.2 oz), SpO2 98 %.  Physical Exam  Nursing note and  vitals reviewed.  Constitutional: He is oriented to person, place, and time. He appears well-developed and well-nourished. No distress.  HENT:  Head: Normocephalic.  Mouth/Throat: Oropharynx is clear and moist.  Eyes: Pupils are equal, round, and reactive to light. Conjunctivae are normal.  Neck: Normal range of motion. Neck supple.  Cardiovascular: Normal rate and regular rhythm.  Respiratory: Effort normal and breath sounds normal. No stridor. No respiratory distress. He has no wheezes.  GI: Soft. Bowel sounds are normal. He exhibits no distension. There is no tenderness.  Musculoskeletal: He exhibits no tenderness.  R-BKA with Prevena VAC draining minimal bloody fluid and compression sock over VAC tubing. No edema LLE.  Neurological: He is alert and oriented to person, place, and time.   UE 5/5 prox to distal. LLE: 4/5 prox to distal. RLE: can lift leg off of chair  Skin: Skin is warm and dry.  Right BK with vac/sero-sang drainage in cannister. Left foot with dry skin  Psychiatric: He has a normal mood and affect. His behavior is normal. Thought content normal.         Results for orders placed or performed during the hospital encounter of 10/12/17 (from the past 48 hour(s))  Glucose, capillary Status: Abnormal   Collection Time: 10/18/17 2:49 PM  Result Value Ref Range   Glucose-Capillary 137 (H) 65 - 99 mg/dL  Glucose, capillary Status: Abnormal   Collection Time: 10/18/17 5:10 PM  Result Value Ref Range   Glucose-Capillary 210 (H) 65 - 99 mg/dL  Glucose, capillary Status: Abnormal   Collection Time: 10/18/17 8:41 PM  Result Value Ref Range   Glucose-Capillary 231 (H) 65 - 99 mg/dL  Glucose, capillary Status: Abnormal   Collection Time: 10/18/17 10:12 PM  Result Value Ref Range   Glucose-Capillary 305 (H) 65 - 99 mg/dL   Comment 1 Notify RN   CBC with Differential/Platelet Status: Abnormal   Collection Time: 10/19/17 4:39 AM  Result Value Ref Range   WBC 15.6 (H) 4.0 - 10.5 K/uL   RBC 3.87 (L) 4.22 - 5.81 MIL/uL   Hemoglobin 11.3 (L) 13.0 - 17.0 g/dL   HCT 36.0 (L) 39.0 - 52.0 %   MCV 93.0 78.0 - 100.0 fL   MCH 29.2 26.0 - 34.0 pg   MCHC 31.4 30.0 - 36.0 g/dL   RDW 14.7 11.5 - 15.5 %   Platelets 525 (H) 150 - 400 K/uL   Neutrophils Relative % 86 %   Lymphocytes Relative 8 %   Monocytes Relative 6 %   Eosinophils Relative 0 %   Basophils Relative 0 %   Band Neutrophils 0 %   Metamyelocytes Relative 0 %   Myelocytes 0 %   Promyelocytes Absolute 0 %   Blasts 0 %   nRBC 0 0 /100 WBC   Other 0 %   Neutro Abs 13.5 (H) 1.7 - 7.7 K/uL   Lymphs Abs 1.2 0.7 - 4.0 K/uL   Monocytes Absolute 0.9 0.1 - 1.0 K/uL   Eosinophils Absolute 0.0 0.0 - 0.7 K/uL   Basophils Absolute 0.0 0.0 - 0.1 K/uL    Comment: Performed at [facility], [address],  [place], [place] [address]  Basic metabolic panel Status: Abnormal   Collection Time: 10/19/17 4:39 AM  Result Value Ref Range   Sodium 135 135 - 145 mmol/L   Potassium 5.4 (H) 3.5 - 5.1 mmol/L   Chloride 100 (L) 101 - 111 mmol/L   CO2 27 22 - 32 mmol/L  Glucose, Bld 366 (H) 65 - 99 mg/dL   BUN 19 6 - 20 mg/dL   Creatinine, Ser 0.98 0.61 - 1.24 mg/dL   Calcium 8.7 (L) 8.9 - 10.3 mg/dL   GFR calc non Af Amer >60 >60 mL/min   GFR calc Af Amer >60 >60 mL/min    Comment: (NOTE)  The eGFR has been calculated using the CKD EPI equation.  This calculation has not been validated in all clinical situations.  eGFR's persistently <60 mL/min signify possible Chronic Kidney  Disease.    Anion gap 8 5 - 15    Comment: Performed at [facility] [address], [place], [place] [address]  Magnesium Status: None   Collection Time: 10/19/17 4:39 AM  Result Value Ref Range   Magnesium 2.0 1.7 - 2.4 mg/dL    Comment: Performed at [facility] [address], [place], [place] [address]  Glucose, capillary Status: Abnormal   Collection Time: 10/19/17 7:34 AM  Result Value Ref Range   Glucose-Capillary 298 (H) 65 - 99 mg/dL  Glucose, capillary Status: Abnormal   Collection Time: 10/19/17 12:00 PM  Result Value Ref Range   Glucose-Capillary 271 (H) 65 - 99 mg/dL  Glucose, capillary Status: Abnormal   Collection Time: 10/19/17 4:41 PM  Result Value Ref Range   Glucose-Capillary 308 (H) 65 - 99 mg/dL  Glucose, capillary Status: Abnormal   Collection Time: 10/19/17 9:48 PM  Result Value Ref Range   Glucose-Capillary 300 (H) 65 - 99 mg/dL  CBC with Differential/Platelet Status: Abnormal   Collection Time: 10/20/17 6:37 AM  Result Value Ref Range   WBC 12.9 (H) 4.0 - 10.5 K/uL   RBC 3.54 (L) 4.22 - 5.81 MIL/uL   Hemoglobin 10.3 (L) 13.0 - 17.0 g/dL   HCT 33.0 (L) 39.0 - 52.0 %   MCV 93.2 78.0 - 100.0 fL   MCH 29.1 26.0 - 34.0 pg   MCHC 31.2 30.0 - 36.0 g/dL   RDW 14.9 11.5 - 15.5 %    Platelets 469 (H) 150 - 400 K/uL   Neutrophils Relative % 69 %   Neutro Abs 8.8 (H) 1.7 - 7.7 K/uL   Lymphocytes Relative 24 %   Lymphs Abs 3.1 0.7 - 4.0 K/uL   Monocytes Relative 6 %   Monocytes Absolute 0.8 0.1 - 1.0 K/uL   Eosinophils Relative 1 %   Eosinophils Absolute 0.1 0.0 - 0.7 K/uL   Basophils Relative 0 %   Basophils Absolute 0.1 0.0 - 0.1 K/uL    Comment: Performed at [facility], [address] [place], [place] [address]  Basic metabolic panel Status: Abnormal   Collection Time: 10/20/17 6:37 AM  Result Value Ref Range   Sodium 133 (L) 135 - 145 mmol/L   Potassium 4.4 3.5 - 5.1 mmol/L   Chloride 102 101 - 111 mmol/L   CO2 26 22 - 32 mmol/L   Glucose, Bld 226 (H) 65 - 99 mg/dL   BUN 21 (H) 6 - 20 mg/dL   Creatinine, Ser 0.81 0.61 - 1.24 mg/dL   Calcium 8.2 (L) 8.9 - 10.3 mg/dL   GFR calc non Af Amer >60 >60 mL/min   GFR calc Af Amer >60 >60 mL/min    Comment: (NOTE)  The eGFR has been calculated using the CKD EPI equation.  This calculation has not been validated in all clinical situations.  eGFR's persistently <60 mL/min signify possible Chronic Kidney  Disease.  Anion gap 5 5 - 15    Comment: Performed at [facility] [address], [place], [place] [address]  Magnesium Status: None   Collection Time: 10/20/17 6:37 AM  Result Value Ref Range   Magnesium 2.0 1.7 - 2.4 mg/dL    Comment: Performed at [facility] [address], [place], [place] [address]  Glucose, capillary Status: Abnormal   Collection Time: 10/20/17 8:36 AM  Result Value Ref Range   Glucose-Capillary 183 (H) 65 - 99 mg/dL  Glucose, capillary Status: Abnormal   Collection Time: 10/20/17 1:07 PM  Result Value Ref Range   Glucose-Capillary 229 (H) 65 - 99 mg/dL   No results found.  Medical Problem List and Plan:  1. Functional and mobility deficits secondary to right BKA  -admit to inpatient rehab  2. DVT Prophylaxis/Anticoagulation: Pharmaceutical: Lovenox  3.  Pain Management: Oxycodone prn effective  4. Mood: LCSW to follow for evaluation and support.  5. Neuropsych: This patient is capable of making decisions on his own behalf.  6. Skin/Wound Care: Routine pressure relief measures.  -vac x 7 days to right BK incision  -stump sock/shrinker  7. Fluids/Electrolytes/Nutrition: Monitor I/O. Check lytes in am.  8. T2DM: Monitor BS ac/hs continue Lantus at bedtime with meal coverage.  9. Mild hyponatremia: Will recheck labs in am.  10 ABLA: Recheck in am.  11. CAD with chronic systolic CHF: Monitor daily weights and for other signs of overload.    Post Admission Physician Evaluation:  1. Functional deficits secondary to right BKA. 2. Patient is admitted to receive collaborative, interdisciplinary care between the physiatrist, rehab nursing staff, and therapy team. 3. Patient's level of medical complexity and substantial therapy needs in context of that medical necessity cannot be provided at a lesser intensity of care such as a SNF. 4. Patient has experienced substantial functional loss from his/her baseline which was documented above under the "Functional History" and "Functional Status" headings. Judging by the patient's diagnosis, physical exam, and functional history, the patient has potential for functional progress which will result in measurable gains while on inpatient rehab. These gains will be of substantial and practical use upon discharge in facilitating mobility and self-care at the household level. 5. Physiatrist will provide 24 hour management of medical needs as well as oversight of the therapy plan/treatment and provide guidance as appropriate regarding the interaction of the two. 6. The Preadmission Screening has been reviewed and patient status is unchanged unless otherwise stated above. 7. 24 hour rehab nursing will assist with bladder management, bowel management, safety, skin/wound care, disease management, medication administration,  pain management and patient education and help integrate therapy concepts, techniques,education, etc. 8. PT will assess and treat for/with: Lower extremity strength, range of motion, stamina, balance, functional mobility, safety, adaptive techniques and equipment, pre-prosthetic education, pain control. Goals are: mod I. 9. OT will assess and treat for/with: ADL's, functional mobility, safety, upper extremity strength, adaptive techniques and equipment, pre-prosthetic education, community reentry. Goals are: mod I. Therapy may not yet proceed with showering this patient. 10. SLP will assess and treat for/with: n/a. Goals are: n/a. 11. Case Management and Social Worker will assess and treat for psychological issues and discharge planning. 12. Team conference will be held weekly to assess progress toward goals and to determine barriers to discharge. 13. Patient will receive at least 3 hours of therapy per day at least 5 days per week. 14. ELOS: 8-14 days  15. Prognosis: excellent   Alroy Dust  Alen Blew, MD, [facility]  10/20/2017  Bary Leriche, PA-C  10/20/2017

## 2017-10-20 NOTE — Progress Notes (Signed)
Received pt. As a new admission,pt. Was oriented to the unit routine and protocol.Safety plan was explained,fall prevention plan was explained and signed.

## 2017-10-20 NOTE — Evaluation (Signed)
Occupational Therapy Evaluation Patient Details Name: Brian Herring MRN: [id number] DOB: June 02, 1956 Today's Date: 10/20/2017    History of Present Illness Pt adm with rt foot ulcer with infection and now s/p rt BKA on 3/27. PMH - HTN, DM, CHF   Clinical Impression   Pt is typically independent. Presents with mild R LE pain and poor standing balance. Pt requires set up to moderate assistance for ADL. He is unable to lift his L LE from floor in order to hop. Sit to stand from recliner requires moderate assistance. Began education in compensatory strategies for LB ADL an toileting. Pt has excellent potential to function at a modified independent level with intensive therapy. Recommending CIR. Pt reports he has many friends to assist with transportation and IADL. Will follow acutely.     Follow Up Recommendations  CIR    Equipment Recommendations  Wheelchair (measurements OT);Wheelchair cushion (measurements OT)(drop arm commode)    Recommendations for Other Services       Precautions / Restrictions Precautions Precautions: Fall Restrictions Weight Bearing Restrictions: Yes RLE Weight Bearing: Non weight bearing      Mobility Bed Mobility Overal bed mobility: Needs Assistance Bed Mobility: Supine to Sit     Supine to sit: Supervision;HOB elevated        Transfers Overall transfer level: Needs assistance Equipment used: Rolling walker (2 wheeled) Transfers: Lateral/Scoot Transfers;Sit to/from Stand Sit to Stand: Mod assist        Lateral/Scoot Transfers: Min guard General transfer comment: cues for hand placement, assist to rise and steady, increased effort, pt able to laterally transfer with min guard assist to drop arm recliner    Balance Overall balance assessment: Needs assistance Sitting-balance support: No upper extremity supported;Feet supported Sitting balance-Leahy Scale: Good     Standing balance support: Bilateral upper extremity supported Standing  balance-Leahy Scale: Poor Standing balance comment: unable to release walker in static standing                           ADL either performed or assessed with clinical judgement   ADL Overall ADL's : Needs assistance/impaired Eating/Feeding: Independent;Bed level   Grooming: Set up;Sitting;Wash/dry hands;Wash/dry face;Oral care   Upper Body Bathing: Set up;Sitting   Lower Body Bathing: Moderate assistance;Sitting/lateral leans   Upper Body Dressing : Set up;Sitting   Lower Body Dressing: Moderate assistance;Sitting/lateral leans   Toilet Transfer: Moderate assistance;Stand-pivot;RW;BSC   Toileting- Clothing Manipulation and Hygiene: Moderate assistance;Sitting/lateral lean       Functional mobility during ADLs: (unable to hop with RW) General ADL Comments: began educating pt in compensatory techniques for ADL and IADL until pt receives prosthesis     Vision Baseline Vision/History: Wears glasses Wears Glasses: Reading only Patient Visual Report: No change from baseline       Perception     Praxis      Pertinent Vitals/Pain Pain Assessment: 0-10 Pain Score: 3  Pain Location: RLE Pain Descriptors / Indicators: Operative site guarding Pain Intervention(s): Monitored during session;Repositioned     Hand Dominance Right   Extremity/Trunk Assessment Upper Extremity Assessment Upper Extremity Assessment: Overall WFL for tasks assessed   Lower Extremity Assessment Lower Extremity Assessment: Defer to PT evaluation   Cervical / Trunk Assessment Cervical / Trunk Assessment: Normal   Communication Communication Communication: No difficulties   Cognition Arousal/Alertness: Awake/alert Behavior During Therapy: WFL for tasks assessed/performed Overall Cognitive Status: Within Functional Limits for tasks assessed  General Comments       Exercises     Shoulder Instructions      Home Living  Family/patient expects to be discharged to:: Private residence Living Arrangements: Alone Available Help at Discharge: Friend(s);Available PRN/intermittently Type of Home: House Home Access: Stairs to enter [employer] of Steps: 2 Entrance Stairs-Rails: Right;Left;Can reach both Home Layout: One level     Bathroom Shower/Tub: Occupational psychologist: Standard     Home Equipment: Grab bars - tub/shower          Prior Functioning/Environment Level of Independence: Independent        Comments: drives        OT Problem List: Impaired balance (sitting and/or standing);Decreased knowledge of use of DME or AE;Pain      OT Treatment/Interventions: Self-care/ADL training;DME and/or AE instruction;Patient/family education;Balance training;Therapeutic activities    OT Goals(Current goals can be found in the care plan section) Acute Rehab OT Goals Patient Stated Goal: Pt wants to return to independence and back to doing what he likes to do OT Goal Formulation: With patient Time For Goal Achievement: 11/03/17 Potential to Achieve Goals: Good ADL Goals Pt Will Perform Grooming: with modified independence;sitting(at sink) Pt Will Perform Lower Body Bathing: with modified independence;sitting/lateral leans Pt Will Perform Lower Body Dressing: with modified independence;sitting/lateral leans Pt Will Transfer to Toilet: ambulating;with modified independence;bedside commode(BSC over toilet) Pt Will Perform Toileting - Clothing Manipulation and hygiene: with modified independence;sitting/lateral leans  OT Frequency: Min 2X/week   Barriers to D/C:            Co-evaluation              AM-PAC PT "6 Clicks" Daily Activity     Outcome Measure Help from another person eating meals?: None Help from another person taking care of personal grooming?: A Little Help from another person toileting, which includes using toliet, bedpan, or urinal?: A Lot Help from  another person bathing (including washing, rinsing, drying)?: A Lot Help from another person to put on and taking off regular upper body clothing?: None Help from another person to put on and taking off regular lower body clothing?: A Lot 6 Click Score: 17   End of Session Equipment Utilized During Treatment: Gait belt;Rolling walker  Activity Tolerance: Patient tolerated treatment well Patient left: in chair;with call bell/phone within reach;with chair alarm set;with nursing/sitter in room  OT Visit Diagnosis: Unsteadiness on feet (R26.81);Pain                Time: 4825-0037 OT Time Calculation (min): 29 min Charges:  OT General Charges $OT Visit: 1 Visit OT Evaluation $OT Eval Moderate Complexity: 1 Mod OT Treatments $Self Care/Home Management : 8-22 mins G-Codes:     2017-11-02 Nestor Lewandowsky, OTR/L Pager: [phone]  Werner Lean, Haze Boyden 02-Nov-2017, 10:24 AM

## 2017-10-20 NOTE — H&P (Signed)
Physical Medicine and Rehabilitation Admission H&P     Chief Complaint  Patient presents with  . Functional deficits due to BKA  HPI: Brian Herring is a 61 y.o. male with history of T2DM, HTN, chronic systolic CHF who was admitted on 10/12/17 with infected foot ulcer with osteomyelitis. He was started on IV antibiotics and underwent right transtibial amputation. He has had issues with bloody stools felt to be due to ASA and Sq heparin. H/H stable. Mild hyponateremia noted and reactive leucocytosis resolving. Pain has been reasonably controlled. Therapy evaluations done today and CIR recommended due to functional deficits.   Review of Systems  Constitutional: Negative for chills and fever.  HENT: Negative for hearing loss and tinnitus.  Eyes: Negative for blurred vision and double vision.  Respiratory: Negative for cough.  Post nasal drip in mornings.  Cardiovascular: Negative for chest pain and palpitations.  Gastrointestinal: Negative for constipation, heartburn and nausea.  Genitourinary: Negative for dysuria and urgency.  Musculoskeletal: Negative for myalgias and neck pain.  Skin: Negative for rash.  Neurological: Positive for sensory change. Negative for focal weakness.  Psychiatric/Behavioral: Negative for depression. The patient does not have insomnia.       Past Medical History:  Diagnosis Date  . Chronic systolic CHF (congestive heart failure) (HCC)   . Diabetes mellitus without complication (HCC)   . Hyperlipidemia   . Hypertension         Past Surgical History:  Procedure Laterality Date  . AMPUTATION Right 10/14/2017   Procedure: AMPUTATION GREAT TOE; Surgeon: Duda, Marcus V, MD; Location: [facility] OR; Service: Orthopedics; Laterality: Right;  . AMPUTATION Right 10/18/2017   Procedure: RIGHT BELOW KNEE AMPUTATION; Surgeon: Duda, Marcus V, MD; Location: [facility] OR; Service: Orthopedics; Laterality: Right;  . SHOULDER SURGERY  01/05        Family History  Problem Relation Age  of Onset  . Pneumonia Mother    aspiration   Social History: Lives alone. Disabled. Independent without AD. Has friends who can check in after discharge. He reports that he has never smoked. He has never used smokeless tobacco. He reports that he does not drink alcohol. His drug history is not on file.      Allergies  Allergen Reactions  . Sulfur Rash         Medications Prior to Admission  Medication Sig Dispense Refill  . acarbose (PRECOSE) 25 MG tablet Take 1 tablet by mouth 3 (three) times daily with meals.     . aspirin 81 MG tablet Take 81 mg by mouth every morning.     . Empagliflozin (JARDIANCE PO) Take 25 mg by mouth every morning.     . furosemide (LASIX) 40 MG tablet Take 40 mg by mouth daily as needed.     . glimepiride (AMARYL) 4 MG tablet Take 1 tablet by mouth every morning.   1  . ibuprofen (ADVIL,MOTRIN) 200 MG tablet Take 200 mg by mouth every 6 (six) hours as needed for fever, headache, mild pain or moderate pain.    . metFORMIN (GLUCOPHAGE) 500 MG tablet Take 1,000 mg by mouth 2 (two) times daily with a meal.     . metoprolol succinate (TOPROL-XL) 50 MG 24 hr tablet Take 1 tablet (50 mg total) by mouth daily. Take with or immediately following a meal. (Patient taking differently: Take 25 mg by mouth every evening. Take with or immediately following a meal.) 30 tablet 3  . rosuvastatin (CRESTOR) 10 MG tablet Take 10 mg by   mouth every evening.     . sacubitril-valsartan (ENTRESTO) 97-103 MG Take 1 tablet by mouth 2 (two) times daily.    . [DISCONTINUED] spironolactone (ALDACTONE) 25 MG tablet Take 1 tablet (25 mg total) by mouth 2 (two) times daily. (Patient taking differently: Take 25 mg by mouth every evening. ) 180 tablet 3  . amLODipine (NORVASC) 5 MG tablet Take 1 tablet (5 mg total) by mouth daily. (Patient not taking: Reported on 10/12/2017) 90 tablet 3  . atorvastatin (LIPITOR) 80 MG tablet Take 1 tablet (80 mg total) by mouth daily. (Patient not taking: Reported on  10/12/2017) 90 tablet 3  . ivabradine ([name]) 7.5 MG TABS tablet Take 1 tablet (7.5 mg total) by mouth 2 (two) times daily with a meal. (Patient not taking: Reported on 10/12/2017) 60 tablet 3  . KOMBIGLYZE XR 2.11-998 MG TB24 Take 1 tablet by mouth 2 (two) times daily.  0  . ramipril (ALTACE) 10 MG capsule Take 1 capsule (10 mg total) by mouth daily. (Patient not taking: Reported on 10/12/2017) 90 capsule 3   Drug Regimen Review  Drug regimen was reviewed and remains appropriate with no significant issues identified  Home:  Home Living  Family/patient expects to be discharged to:: Private residence  Living Arrangements: Alone  Available Help at Discharge: Friend(s), Available PRN/intermittently  Type of Home: House  Home Access: Stairs to enter  Entrance Stairs-Number of Steps: 2  Entrance Stairs-Rails: Right, Left, Can reach both  Home Layout: One level  Bathroom Shower/Tub: Walk-in shower  Bathroom Toilet: Standard  Bathroom Accessibility: Yes  Home Equipment: Grab bars - tub/shower  Functional History:  Prior Function  Level of Independence: Independent  Comments: drives  Functional Status:  Mobility:  Bed Mobility  Overal bed mobility: Needs Assistance  Bed Mobility: Supine to Sit  Supine to sit: Supervision, HOB elevated  General bed mobility comments: Pt sitting in recliner on arrival.  Transfers  Overall transfer level: Needs assistance  Equipment used: Rolling walker (2 wheeled)  Transfers: Sit to/from Stand  Sit to Stand: Mod assist  Stand pivot transfers: Min assist  Lateral/Scoot Transfers: Min guard  General transfer comment: Cues for hand placement, assist to rise and steady. Pt required increased time and effort. Pt performed 4 min standing trial and 2 min standing trial.  Ambulation/Gait  Ambulation/Gait assistance: (NT remains to lack strength for hop to pattern. )  General Gait Details: Unable   ADL:  ADL  Overall ADL's : Needs assistance/impaired    Eating/Feeding: Independent, Bed level  Grooming: Set up, Sitting, Wash/dry hands, Wash/dry face, Oral care  Upper Body Bathing: Set up, Sitting  Lower Body Bathing: Moderate assistance, Sitting/lateral leans  Upper Body Dressing : Set up, Sitting  Lower Body Dressing: Moderate assistance, Sitting/lateral leans  Toilet Transfer: Moderate assistance, Stand-pivot, RW, BSC  Toileting- Clothing Manipulation and Hygiene: Moderate assistance, Sitting/lateral lean  Functional mobility during ADLs: (unable to hop with RW)  General ADL Comments: began educating pt in compensatory techniques for ADL and IADL until pt receives prosthesis  Cognition:  Cognition  Overall Cognitive Status: Within Functional Limits for tasks assessed  Orientation Level: Oriented X4  Cognition  Arousal/Alertness: Awake/alert  Behavior During Therapy: WFL for tasks assessed/performed  Overall Cognitive Status: Within Functional Limits for tasks assessed  Blood pressure 96/67, pulse (!) 45, temperature 97.6 F (36.4 C), resp. rate 19, height 6' (1.829 m), weight 104.7 kg (230 lb 13.2 oz), SpO2 98 %.  Physical Exam  Nursing note and   vitals reviewed.  Constitutional: He is oriented to person, place, and time. He appears well-developed and well-nourished. No distress.  HENT:  Head: Normocephalic.  Mouth/Throat: Oropharynx is clear and moist.  Eyes: Pupils are equal, round, and reactive to light. Conjunctivae are normal.  Neck: Normal range of motion. Neck supple.  Cardiovascular: Normal rate and regular rhythm.  Respiratory: Effort normal and breath sounds normal. No stridor. No respiratory distress. He has no wheezes.  GI: Soft. Bowel sounds are normal. He exhibits no distension. There is no tenderness.  Musculoskeletal: He exhibits no tenderness.  R-BKA with Prevena VAC draining minimal bloody fluid and compression sock over VAC tubing. No edema LLE.  Neurological: He is alert and oriented to person, place, and time.   UE 5/5 prox to distal. LLE: 4/5 prox to distal. RLE: can lift leg off of chair  Skin: Skin is warm and dry.  Right BK with vac/sero-sang drainage in cannister. Left foot with dry skin  Psychiatric: He has a normal mood and affect. His behavior is normal. Thought content normal.         Results for orders placed or performed during the hospital encounter of 10/12/17 (from the past 48 hour(s))  Glucose, capillary Status: Abnormal   Collection Time: 10/18/17 2:49 PM  Result Value Ref Range   Glucose-Capillary 137 (H) 65 - 99 mg/dL  Glucose, capillary Status: Abnormal   Collection Time: 10/18/17 5:10 PM  Result Value Ref Range   Glucose-Capillary 210 (H) 65 - 99 mg/dL  Glucose, capillary Status: Abnormal   Collection Time: 10/18/17 8:41 PM  Result Value Ref Range   Glucose-Capillary 231 (H) 65 - 99 mg/dL  Glucose, capillary Status: Abnormal   Collection Time: 10/18/17 10:12 PM  Result Value Ref Range   Glucose-Capillary 305 (H) 65 - 99 mg/dL   Comment 1 Notify RN   CBC with Differential/Platelet Status: Abnormal   Collection Time: 10/19/17 4:39 AM  Result Value Ref Range   WBC 15.6 (H) 4.0 - 10.5 K/uL   RBC 3.87 (L) 4.22 - 5.81 MIL/uL   Hemoglobin 11.3 (L) 13.0 - 17.0 g/dL   HCT 36.0 (L) 39.0 - 52.0 %   MCV 93.0 78.0 - 100.0 fL   MCH 29.2 26.0 - 34.0 pg   MCHC 31.4 30.0 - 36.0 g/dL   RDW 14.7 11.5 - 15.5 %   Platelets 525 (H) 150 - 400 K/uL   Neutrophils Relative % 86 %   Lymphocytes Relative 8 %   Monocytes Relative 6 %   Eosinophils Relative 0 %   Basophils Relative 0 %   Band Neutrophils 0 %   Metamyelocytes Relative 0 %   Myelocytes 0 %   Promyelocytes Absolute 0 %   Blasts 0 %   nRBC 0 0 /100 WBC   Other 0 %   Neutro Abs 13.5 (H) 1.7 - 7.7 K/uL   Lymphs Abs 1.2 0.7 - 4.0 K/uL   Monocytes Absolute 0.9 0.1 - 1.0 K/uL   Eosinophils Absolute 0.0 0.0 - 0.7 K/uL   Basophils Absolute 0.0 0.0 - 0.1 K/uL    Comment: Performed at [facility], [address]  [place], [place] [address]  Basic metabolic panel Status: Abnormal   Collection Time: 10/19/17 4:39 AM  Result Value Ref Range   Sodium 135 135 - 145 mmol/L   Potassium 5.4 (H) 3.5 - 5.1 mmol/L   Chloride 100 (L) 101 - 111 mmol/L   CO2 27 22 - 32 mmol/L     Glucose, Bld 366 (H) 65 - 99 mg/dL   BUN 19 6 - 20 mg/dL   Creatinine, Ser 0.98 0.61 - 1.24 mg/dL   Calcium 8.7 (L) 8.9 - 10.3 mg/dL   GFR calc non Af Amer >60 >60 mL/min   GFR calc Af Amer >60 >60 mL/min    Comment: (NOTE)  The eGFR has been calculated using the CKD EPI equation.  This calculation has not been validated in all clinical situations.  eGFR's persistently <60 mL/min signify possible Chronic Kidney  Disease.    Anion gap 8 5 - 15    Comment: Performed at [facility], [address] [place], [place] [address]  Magnesium Status: None   Collection Time: 10/19/17 4:39 AM  Result Value Ref Range   Magnesium 2.0 1.7 - 2.4 mg/dL    Comment: Performed at [facility], [address] [place], [place] [address]  Glucose, capillary Status: Abnormal   Collection Time: 10/19/17 7:34 AM  Result Value Ref Range   Glucose-Capillary 298 (H) 65 - 99 mg/dL  Glucose, capillary Status: Abnormal   Collection Time: 10/19/17 12:00 PM  Result Value Ref Range   Glucose-Capillary 271 (H) 65 - 99 mg/dL  Glucose, capillary Status: Abnormal   Collection Time: 10/19/17 4:41 PM  Result Value Ref Range   Glucose-Capillary 308 (H) 65 - 99 mg/dL  Glucose, capillary Status: Abnormal   Collection Time: 10/19/17 9:48 PM  Result Value Ref Range   Glucose-Capillary 300 (H) 65 - 99 mg/dL  CBC with Differential/Platelet Status: Abnormal   Collection Time: 10/20/17 6:37 AM  Result Value Ref Range   WBC 12.9 (H) 4.0 - 10.5 K/uL   RBC 3.54 (L) 4.22 - 5.81 MIL/uL   Hemoglobin 10.3 (L) 13.0 - 17.0 g/dL   HCT 33.0 (L) 39.0 - 52.0 %   MCV 93.2 78.0 - 100.0 fL   MCH 29.1 26.0 - 34.0 pg   MCHC 31.2 30.0 - 36.0 g/dL   RDW 14.9 11.5 - 15.5 %    Platelets 469 (H) 150 - 400 K/uL   Neutrophils Relative % 69 %   Neutro Abs 8.8 (H) 1.7 - 7.7 K/uL   Lymphocytes Relative 24 %   Lymphs Abs 3.1 0.7 - 4.0 K/uL   Monocytes Relative 6 %   Monocytes Absolute 0.8 0.1 - 1.0 K/uL   Eosinophils Relative 1 %   Eosinophils Absolute 0.1 0.0 - 0.7 K/uL   Basophils Relative 0 %   Basophils Absolute 0.1 0.0 - 0.1 K/uL    Comment: Performed at [facility], [address] [place], [place] [address]  Basic metabolic panel Status: Abnormal   Collection Time: 10/20/17 6:37 AM  Result Value Ref Range   Sodium 133 (L) 135 - 145 mmol/L   Potassium 4.4 3.5 - 5.1 mmol/L   Chloride 102 101 - 111 mmol/L   CO2 26 22 - 32 mmol/L   Glucose, Bld 226 (H) 65 - 99 mg/dL   BUN 21 (H) 6 - 20 mg/dL   Creatinine, Ser 0.81 0.61 - 1.24 mg/dL   Calcium 8.2 (L) 8.9 - 10.3 mg/dL   GFR calc non Af Amer >60 >60 mL/min   GFR calc Af Amer >60 >60 mL/min    Comment: (NOTE)  The eGFR has been calculated using the CKD EPI equation.  This calculation has not been validated in all clinical situations.  eGFR's persistently <60 mL/min signify possible Chronic Kidney  Disease.      Anion gap 5 5 - 15    Comment: Performed at [facility], [address] [place], [place] [address]  Magnesium Status: None   Collection Time: 10/20/17 6:37 AM  Result Value Ref Range   Magnesium 2.0 1.7 - 2.4 mg/dL    Comment: Performed at [facility], [address] [place], [place] [address]  Glucose, capillary Status: Abnormal   Collection Time: 10/20/17 8:36 AM  Result Value Ref Range   Glucose-Capillary 183 (H) 65 - 99 mg/dL  Glucose, capillary Status: Abnormal   Collection Time: 10/20/17 1:07 PM  Result Value Ref Range   Glucose-Capillary 229 (H) 65 - 99 mg/dL   No results found.  Medical Problem List and Plan:  1. Functional and mobility deficits secondary to right BKA  -admit to inpatient rehab  2. DVT Prophylaxis/Anticoagulation: Pharmaceutical: Lovenox  3.  Pain Management: Oxycodone prn effective  4. Mood: LCSW to follow for evaluation and support.  5. Neuropsych: This patient is capable of making decisions on his own behalf.  6. Skin/Wound Care: Routine pressure relief measures.  -vac x 7 days to right BK incision  -stump sock/shrinker  7. Fluids/Electrolytes/Nutrition: Monitor I/O. Check lytes in am.  8. T2DM: Monitor BS ac/hs continue Lantus at bedtime with meal coverage.  9. Mild hyponatremia: Will recheck labs in am.  10 ABLA: Recheck in am.  11. CAD with chronic systolic CHF: Monitor daily weights and for other signs of overload.    Post Admission Physician Evaluation:  1. Functional deficits secondary to right BKA. 2. Patient is admitted to receive collaborative, interdisciplinary care between the physiatrist, rehab nursing staff, and therapy team. 3. Patient's level of medical complexity and substantial therapy needs in context of that medical necessity cannot be provided at a lesser intensity of care such as a SNF. 4. Patient has experienced substantial functional loss from his/her baseline which was documented above under the "Functional History" and "Functional Status" headings. Judging by the patient's diagnosis, physical exam, and functional history, the patient has potential for functional progress which will result in measurable gains while on inpatient rehab. These gains will be of substantial and practical use upon discharge in facilitating mobility and self-care at the household level. 5. Physiatrist will provide 24 hour management of medical needs as well as oversight of the therapy plan/treatment and provide guidance as appropriate regarding the interaction of the two. 6. The Preadmission Screening has been reviewed and patient status is unchanged unless otherwise stated above. 7. 24 hour rehab nursing will assist with bladder management, bowel management, safety, skin/wound care, disease management, medication administration,  pain management and patient education and help integrate therapy concepts, techniques,education, etc. 8. PT will assess and treat for/with: Lower extremity strength, range of motion, stamina, balance, functional mobility, safety, adaptive techniques and equipment, pre-prosthetic education, pain control. Goals are: mod I. 9. OT will assess and treat for/with: ADL's, functional mobility, safety, upper extremity strength, adaptive techniques and equipment, pre-prosthetic education, community reentry. Goals are: mod I. Therapy may not yet proceed with showering this patient. 10. SLP will assess and treat for/with: n/a. Goals are: n/a. 11. Case Management and Social Worker will assess and treat for psychological issues and discharge planning. 12. Team conference will be held weekly to assess progress toward goals and to determine barriers to discharge. 13. Patient will receive at least 3 hours of therapy per day at least 5 days per week. 14. ELOS: 8-14 days  15. Prognosis: excellent   Theora Vankirk   T. Aubrie Lucien, MD, FAAPMR  [facility]  10/20/2017  Pamela S Love, PA-C  10/20/2017  

## 2017-10-20 NOTE — Progress Notes (Addendum)
Standley Dakins to be D/C'd Rehab (CIR) per MD order.  Discussed with the patient and all questions fully answered.  VSS, Skin clean, dry and intact without evidence of skin break down, no evidence of skin tears noted. IV catheter discontinued intact. Site without signs and symptoms of complications. Dressing and pressure applied.  An After Visit Summary was printed and given to the patient. Patient received prescription.  Report given to Maudry Mayhew, RN at [facility]. All questions answered.   Patient's belongings double bagged and tapped, gown and full linen changed, and bed wiped down with purple santi wipes. Patient escorted via bed to CIR.  [place] 10/20/2017 3:25 PM

## 2017-10-20 NOTE — Discharge Summary (Signed)
Physician Discharge Summary  Brian Herring KGU:[id number] DOB: [birth year] DOA: 10/12/2017  PCP: [name], MD  Admit date: 10/12/2017 Discharge date: 10/20/2017  Admitted From: Home Disposition:  CIR  Recommendations for Outpatient Follow-up:  1. Follow up with CIR provider at earliest convenience 2. Follow-up with orthopedic/Dr. Sharol Given in a week 3. Wound care/wound VAC care as per Dr. Jess Barters recommendations 4. Activity as per PT recommendations   Home Health: No Equipment/Devices: Wound VAC  Discharge Condition: Stable CODE STATUS: Full Diet recommendation: Heart Healthy / Carb Modified   Brief/Interim Summary: 62 year old male with history of hypertension, hyperlipidemia, diabetes mellitus, systolic CHF with EF of 62% presented with right foot ulcer with infection.  MRI showed osteomyelitis.  Orthopedics was consulted.  Patient was started on broad-spectrum antibiotics.  Patient underwent amputation of first and second ray right foot and subsequently underwent right transtibial amputation on 10/18/2017 by orthopedics.  Patient will be discharged to CIR.  Antibiotics have been discontinued.    Discharge Diagnoses:  Principal Problem:   Diabetic foot infection ([place]) Active Problems:   Diabetic foot ulcer with osteomyelitis ([place])   Hyperlipidemia   HTN (hypertension), benign   Poorly controlled diabetes mellitus ([place])   Sepsis ([place])   Diabetic foot ulcer ([place])   Hyponatremia   Chronic systolic CHF (congestive heart failure) ([place])   Cellulitis of right foot  Sepsis secondary to right great toe osteomyelitis and cellulitis -Resolved.  Hemodynamically stable currently.    Completed antibiotic treatment.  Right foot osteomyelitis and cellulitis -Treated with intravenous antibiotics which were stopped on 10/19/2017 after the transtibial amputation -Continue pain control -MRI right foot showed soft tissue ulceration along the medial aspect of the great toe with extensive  gas forming infection of the great toe and osteomyelitis of the first proximal and distal phalanges. Large 6 cm abscess about the great toe extending proximally to the mid first metatarsal. Infectious tenosynovitis of the extensor hallux longus tendon -ABI WNL B/L (right 1.09, Left 1.17). Right TBI could not be ascertained. Left TBI 1.32 -Status post amputation of first and second ray right foot by orthopedics -Status post right transtibial amputation on 10/18/2017.  Wound care and wound VAC care as per orthopedics recommendations.  Outpatient follow-up with orthopedics -Discharge to CIR today  Leukocytosis -Probably secondary to above.   Improving.  Outpatient follow-up  Diabetes mellitus, type II, poorly controlled with hyperglycemia -Continue Lantus 15 units at bedtime along with NovoLog and Accu-Cheks with coverage. -Home medications held -Hemoglobin A1c 8.7 -Home medications probably can be resumed once the patient is ready for discharge from CIR   Chronic systolic heart failure -Echocardiogram 09/02/2015: EF of 25-30% -Patient currently appears to be compensated and euvolemic -spironolactone held due to sepsis initially. -Continue Entresto, metoprolol. -Blood pressure stable currently.  Spironolactone was restarted on 10/16/2017. -Spironolactone was held yesterday for mild hyperkalemia which is resolved.  Spironolactone can be restarted and labs can be monitored intermittently.  Mild hyperkalemia -Resolved.  Outpatient follow-up  Hyponatremia, chronic -Suspect secondary to diuretics and Entresto -After reviewing patient's chart, appears to be a chronic issue -Stable -Patient follow-up  Essential hypertension -Continue metoprolol, Entresto and spironolactone.  Outpatient follow-up  Hyperlipidemia -Continue statin  Slight blood in stools -Hemoglobin stable.  Patient thinks that his bleeding has stopped. Patient has an outpatient schedule with [facility] for  colonoscopy.     Discharge Instructions  Discharge Instructions    Ambulatory referral to Nutrition and Diabetic Education   Complete by:  As directed  Call MD for:  difficulty breathing, headache or visual disturbances   Complete by:  As directed    Call MD for:  extreme fatigue   Complete by:  As directed    Call MD for:  hives   Complete by:  As directed    Call MD for:  persistant dizziness or light-headedness   Complete by:  As directed    Call MD for:  persistant nausea and vomiting   Complete by:  As directed    Call MD for:  redness, tenderness, or signs of infection (pain, swelling, redness, odor or green/yellow discharge around incision site)   Complete by:  As directed    Call MD for:  severe uncontrolled pain   Complete by:  As directed    Call MD for:  temperature >100.4   Complete by:  As directed    Diet - low sodium heart healthy   Complete by:  As directed    Diet Carb Modified   Complete by:  As directed    Discharge instructions   Complete by:  As directed    Wound care and wound VAC care as per orthopedic recommendations Activity as per PT recommendations   Increase activity slowly   Complete by:  As directed      Allergies as of 10/20/2017      Reactions   Sulfur Rash      Medication List    STOP taking these medications   acarbose 25 MG tablet Commonly known as:  PRECOSE   amLODipine 5 MG tablet Commonly known as:  NORVASC   atorvastatin 80 MG tablet Commonly known as:  LIPITOR   glimepiride 4 MG tablet Commonly known as:  AMARYL   ibuprofen 200 MG tablet Commonly known as:  ADVIL,MOTRIN   ivabradine 7.5 MG Tabs tablet Commonly known as:  [name]   JARDIANCE PO   KOMBIGLYZE XR 2.11-998 MG Tb24 Generic drug:  Saxagliptin-Metformin   metFORMIN 500 MG tablet Commonly known as:  GLUCOPHAGE   ramipril 10 MG capsule Commonly known as:  ALTACE     TAKE these medications   aspirin 81 MG tablet Take 81 mg by mouth every  morning.   docusate sodium 100 MG capsule Commonly known as:  COLACE Take 1 capsule (100 mg total) by mouth 2 (two) times daily.   ENTRESTO 97-103 MG Generic drug:  sacubitril-valsartan Take 1 tablet by mouth 2 (two) times daily.   feeding supplement (PRO-STAT SUGAR FREE 64) Liqd Take 30 mLs by mouth 2 (two) times daily.   furosemide 40 MG tablet Commonly known as:  LASIX Take 40 mg by mouth daily as needed.   insulin aspart 100 UNIT/ML injection Commonly known as:  novoLOG Inject 5 Units into the skin 3 (three) times daily with meals.   insulin aspart 100 UNIT/ML injection Commonly known as:  novoLOG Inject 0-5 Units into the skin at bedtime.   insulin aspart 100 UNIT/ML injection Commonly known as:  novoLOG Inject 0-9 Units into the skin 3 (three) times daily with meals.   insulin glargine 100 UNIT/ML injection Commonly known as:  LANTUS Inject 0.15 mLs (15 Units total) into the skin at bedtime.   loratadine 10 MG tablet Commonly known as:  CLARITIN Take 1 tablet (10 mg total) by mouth daily. Start taking on:  10/21/2017   methocarbamol 500 MG tablet Commonly known as:  ROBAXIN Take 1 tablet (500 mg total) by mouth every 6 (six) hours as needed for muscle spasms.  metoprolol succinate 25 MG 24 hr tablet Commonly known as:  TOPROL-XL Take 1 tablet (25 mg total) by mouth every evening. What changed:    medication strength  how much to take  when to take this  additional instructions   oxyCODONE 5 MG immediate release tablet Commonly known as:  Oxy IR/ROXICODONE Take 1-2 tablets (5-10 mg total) by mouth every 4 (four) hours as needed for moderate pain (pain score 4-6).   polyethylene glycol packet Commonly known as:  MIRALAX / GLYCOLAX Take 17 g by mouth daily as needed for mild constipation.   rosuvastatin 10 MG tablet Commonly known as:  CRESTOR Take 10 mg by mouth every evening.   spironolactone 25 MG tablet Commonly known as:  ALDACTONE Take 1  tablet (25 mg total) by mouth every evening.       Contact information for follow-up providers    [name], MD In 1 week.   Specialty:  Orthopedic Surgery Contact information: [place] [address] [phone]            Contact information for after-discharge care    [facility] .   Service:  Skilled Nursing Contact information: [address] [address] [phone]                 Allergies  Allergen Reactions  . Sulfur Rash    Consultations:  Orthopedics   Procedures/Studies: Mr Foot Right W Wo Contrast  Result Date: 10/13/2017 CLINICAL DATA:  Chronic right great toe ulcer with new discoloration, swelling, and foul odor. Evaluate for osteomyelitis. EXAM: MRI OF THE RIGHT FOREFOOT WITHOUT AND WITH CONTRAST TECHNIQUE: Multiplanar, multisequence MR imaging of the right forefoot was performed before and after the administration of intravenous contrast. CONTRAST:  64mL MULTIHANCE GADOBENATE DIMEGLUMINE 529 MG/ML IV SOLN COMPARISON:  Right foot x-rays from yesterday. FINDINGS: Bones/Joint/Cartilage There is prominent edema and enhancement with corresponding T1 marrow signal intensity involving the first proximal and distal phalanges, consistent with osteomyelitis. No fracture or dislocation. Normal alignment. Small first IP joint and first MTP joint effusions. Ligaments Collateral ligaments are intact.  Lisfranc ligament is intact. Muscles and Tendons There is prominent fluid and enhancement within the extensor hallucis longus tendon sheath, consistent with tenosynovitis. This extends proximally to the level of the navicular. Increased T2 signal within the intrinsic muscles of the forefoot, nonspecific, but likely related to diabetic muscle changes. Soft tissue Soft tissue ulceration along the medial great toe. Large gas containing fluid collection about the great toe, measuring  approximately 6.0 x 3.6 x 4.5 cm (AP by transverse by CC), extending along the dorsal forefoot to the mid first metatarsal. IMPRESSION: 1. Soft tissue ulceration along the medial aspect of the great toe with extensive gas-forming infection of the great toe and osteomyelitis of the first proximal and distal phalanges. Large 6.0 cm abscess about the great toe extending proximally to the mid first metatarsal. 2. Infectious tenosynovitis of the extensor hallucis longus tendon. Electronically Signed   By: Titus Dubin M.D.   On: 10/13/2017 10:07   Dg Foot Complete Right  Result Date: 10/12/2017 CLINICAL DATA:  Right great toe wound. Redness, painful and swollen. EXAM: RIGHT FOOT COMPLETE - 3+ VIEW COMPARISON:  None. FINDINGS: No acute fracture or dislocation. Partial osteolysis of distal medial corner of the first proximal phalanx with a nondisplaced fracture. Soft tissue emphysema in the soft tissue surrounding the great toe extending into the remainder  of the forefoot along the medial aspect with soft tissue swelling. No other fracture or dislocation. No periosteal reaction. Normal alignment. IMPRESSION: 1. Soft tissue emphysema in the soft tissue surrounding the great toe extending into the remainder of the forefoot concerning for gas-forming infection. 2. Partial osteolysis of the distal medial corner of the first proximal phalanx with a nondisplaced fracture concerning for underlying osteomyelitis. Electronically Signed   By: Kathreen Devoid   On: 10/12/2017 14:04    ABI Amputation of 1st and 2nd right ray on 10/14/2017 Right transtibial amputation on 10/18/2017    Subjective: Patient seen and examined at bedside.  He denies any overnight fever, nausea or vomiting.  Discharge Exam: Vitals:   10/19/17 2150 10/20/17 0625  BP: (!) 112/56 109/60  Pulse: 89 78  Resp: 18 18  Temp: 98.2 F (36.8 C) (!) 97.3 F (36.3 C)  SpO2: 95% 96%   Vitals:   10/19/17 0356 10/19/17 0735 10/19/17 2150 10/20/17  0625  BP: 108/69  (!) 112/56 109/60  Pulse: 86  89 78  Resp: 20  18 18   Temp: (!) 96.8 F (36 C) 98.1 F (36.7 C) 98.2 F (36.8 C) (!) 97.3 F (36.3 C)  TempSrc: Oral Oral    SpO2: 97%  95% 96%  Weight: 104.7 kg (230 lb 13.2 oz)     Height:        General: Pt is alert, awake, not in acute distress Cardiovascular: Rate controlled, S1/S2 + Respiratory: Bilateral decreased breath sounds at bases Abdominal: Soft, NT, ND, bowel sounds + Extremities: Right BKA with wound VAC present, no cyanosis    The results of significant diagnostics from this hospitalization (including imaging, microbiology, ancillary and laboratory) are listed below for reference.     Microbiology: Recent Results (from the past 240 hour(s))  Culture, blood (routine x 2)     Status: None   Collection Time: 10/12/17  1:14 PM  Result Value Ref Range Status   Specimen Description   Final    BLOOD RIGHT ANTECUBITAL Performed at [facility], [address] [place], [place] [address]    Special Requests   Final    BOTTLES DRAWN AEROBIC AND ANAEROBIC Blood Culture adequate volume Performed at [facility], [address] [place], [place] [address]    Culture   Final    NO GROWTH 5 DAYS Performed at [facility], [place] [address], [place], [place] [address]    Report Status 10/17/2017 FINAL  Final  Culture, blood (routine x 2)     Status: None   Collection Time: 10/12/17  2:00 PM  Result Value Ref Range Status   Specimen Description   Final    BLOOD LEFT ANTECUBITAL Performed at [facility], [address] [place], [place] [address]    Special Requests   Final    BOTTLES DRAWN AEROBIC AND ANAEROBIC Blood Culture adequate volume Performed at [facility], [address] [place], [place] [address]    Culture   Final    NO GROWTH 5 DAYS Performed at [facility], [place] [address], [place], [place] [address]    Report Status 10/17/2017 FINAL  Final   MRSA PCR Screening     Status: None   Collection Time: 10/13/17  4:25 PM  Result Value Ref Range Status   MRSA by PCR NEGATIVE NEGATIVE Final    Comment:  The GeneXpert MRSA Assay (FDA approved for NASAL specimens only), is one component of a comprehensive MRSA colonization surveillance program. It is not intended to diagnose MRSA infection nor to guide or monitor treatment for MRSA infections. Performed at [facility], [place] [address], [place], [place] [address]   Surgical pcr screen     Status: None   Collection Time: 10/14/17  4:36 AM  Result Value Ref Range Status   MRSA, PCR NEGATIVE NEGATIVE Final   Staphylococcus aureus NEGATIVE NEGATIVE Final    Comment: (NOTE) The Xpert SA Assay (FDA approved for NASAL specimens in patients 27 years of age and older), is one component of a comprehensive surveillance program. It is not intended to diagnose infection nor to guide or monitor treatment. Performed at [facility], [place] [address] [place], [place] [address]   Aerobic/Anaerobic Culture (surgical/deep wound)     Status: None   Collection Time: 10/14/17  7:52 AM  Result Value Ref Range Status   Specimen Description ABSCESS RIGHT TOE GREAT  Final   Special Requests NONE  Final   Gram Stain   Final    RARE WBC PRESENT, PREDOMINANTLY PMN FEW RARE GRAM POSITIVE COCCI IN PAIRS    Culture   Final    MODERATE VIRIDANS STREPTOCOCCUS MODERATE BACTEROIDES THETAIOTAOMICRON BETA LACTAMASE POSITIVE Performed at [facility], [place] [address], [place], [place] [address]    Report Status 10/19/2017 FINAL  Final     Labs: BNP (last 3 results) Recent Labs    10/12/17 2107  BNP 166.0*   Basic Metabolic Panel: Recent Labs  Lab 10/16/17 0757 10/17/17 0628 10/18/17 0647 10/19/17 0439 10/20/17 0637  NA 135 136 134* 135 133*  K 4.5 4.2 4.3 5.4* 4.4  CL 101 102 101 100* 102  CO2 25 24 24 27 26   GLUCOSE 163* 185* 173* 366* 226*  BUN 14 14 9 19  21*   CREATININE 0.75 0.71 0.64 0.98 0.81  CALCIUM 8.3* 8.4* 8.5* 8.7* 8.2*  MG  --  2.1 1.8 2.0 2.0   Liver Function Tests: No results for input(s): AST, ALT, ALKPHOS, BILITOT, PROT, ALBUMIN in the last 168 hours. No results for input(s): LIPASE, AMYLASE in the last 168 hours. No results for input(s): AMMONIA in the last 168 hours. CBC: Recent Labs  Lab 10/16/17 0757 10/17/17 0628 10/17/17 1911 10/18/17 0647 10/19/17 0439 10/20/17 0637  WBC 13.7* 14.9*  --  13.9* 15.6* 12.9*  NEUTROABS  --  11.1*  --  10.2* 13.5* 8.8*  HGB 10.8* 11.4* 11.0* 12.2* 11.3* 10.3*  HCT 34.4* 36.6* 34.8* 37.7* 36.0* 33.0*  MCV 93.0 91.5  --  92.4 93.0 93.2  PLT 399 430*  --  430* 525* 469*   Cardiac Enzymes: No results for input(s): CKTOTAL, CKMB, CKMBINDEX, TROPONINI in the last 168 hours. BNP: Invalid input(s): POCBNP CBG: Recent Labs  Lab 10/19/17 0734 10/19/17 1200 10/19/17 1641 10/19/17 2148 10/20/17 0836  GLUCAP 298* 271* 308* 300* 183*   D-Dimer No results for input(s): DDIMER in the last 72 hours. Hgb A1c No results for input(s): HGBA1C in the last 72 hours. Lipid Profile No results for input(s): CHOL, HDL, LDLCALC, TRIG, CHOLHDL, LDLDIRECT in the last 72 hours. Thyroid function studies No results for input(s): TSH, T4TOTAL, T3FREE, THYROIDAB in the last 72 hours.  Invalid input(s): FREET3 Anemia work up No results for input(s): VITAMINB12, FOLATE, FERRITIN, TIBC, IRON, RETICCTPCT in the last 72 hours. Urinalysis    Component Value  Date/Time   COLORURINE YELLOW 10/12/2017 [place] 10/12/2017 1348   LABSPEC <1.005 (L) 10/12/2017 1348   PHURINE 5.5 10/12/2017 1348   GLUCOSEU >=500 (A) 10/12/2017 1348   HGBUR NEGATIVE 10/12/2017 1348   BILIRUBINUR NEGATIVE 10/12/2017 1348   KETONESUR 15 (A) 10/12/2017 1348   PROTEINUR NEGATIVE 10/12/2017 1348   NITRITE NEGATIVE 10/12/2017 1348   LEUKOCYTESUR NEGATIVE 10/12/2017 1348   Sepsis Labs Invalid input(s):  PROCALCITONIN,  WBC,  LACTICIDVEN Microbiology Recent Results (from the past 240 hour(s))  Culture, blood (routine x 2)     Status: None   Collection Time: 10/12/17  1:14 PM  Result Value Ref Range Status   Specimen Description   Final    BLOOD RIGHT ANTECUBITAL Performed at [facility], [address] [place], [place] [address]    Special Requests   Final    BOTTLES DRAWN AEROBIC AND ANAEROBIC Blood Culture adequate volume Performed at [facility], [address] [place], [place] [address]    Culture   Final    NO GROWTH 5 DAYS Performed at [facility], [place] [address], [place], [place] [address]    Report Status 10/17/2017 FINAL  Final  Culture, blood (routine x 2)     Status: None   Collection Time: 10/12/17  2:00 PM  Result Value Ref Range Status   Specimen Description   Final    BLOOD LEFT ANTECUBITAL Performed at [facility], [address] [place], [place] [address]    Special Requests   Final    BOTTLES DRAWN AEROBIC AND ANAEROBIC Blood Culture adequate volume Performed at [facility], [address] [place], [place] [address]    Culture   Final    NO GROWTH 5 DAYS Performed at [facility], [place] [address], [place], [place] [address]    Report Status 10/17/2017 FINAL  Final  MRSA PCR Screening     Status: None   Collection Time: 10/13/17  4:25 PM  Result Value Ref Range Status   MRSA by PCR NEGATIVE NEGATIVE Final    Comment:        The GeneXpert MRSA Assay (FDA approved for NASAL specimens only), is one component of a comprehensive MRSA colonization surveillance program. It is not intended to diagnose MRSA infection nor to guide or monitor treatment for MRSA infections. Performed at [facility], [place] [address] [place], [place] [address]   Surgical pcr screen     Status: None   Collection Time: 10/14/17  4:36 AM  Result Value Ref Range Status   MRSA, PCR NEGATIVE NEGATIVE Final    Staphylococcus aureus NEGATIVE NEGATIVE Final    Comment: (NOTE) The Xpert SA Assay (FDA approved for NASAL specimens in patients 65 years of age and older), is one component of a comprehensive surveillance program. It is not intended to diagnose infection nor to guide or monitor treatment. Performed at [facility], [place] [address] [place], [place] [address]   Aerobic/Anaerobic Culture (surgical/deep wound)     Status: None   Collection Time: 10/14/17  7:52 AM  Result Value Ref Range Status   Specimen Description ABSCESS RIGHT TOE GREAT  Final   Special Requests NONE  Final   Gram Stain   Final    RARE WBC PRESENT, PREDOMINANTLY PMN FEW RARE GRAM POSITIVE COCCI IN PAIRS    Culture   Final  MODERATE VIRIDANS STREPTOCOCCUS MODERATE BACTEROIDES THETAIOTAOMICRON BETA LACTAMASE POSITIVE Performed at [facility], [place] [address], [place], [place] [address]    Report Status 10/19/2017 FINAL  Final     Time coordinating discharge: 35 minutes  SIGNED:   Aline August, MD  [facility] 10/20/2017, 12:02 PM Pager: [phone]  If 7PM-7AM, please contact night-coverage [email or web address] Password [email or web address]

## 2017-10-20 NOTE — Progress Notes (Signed)
Physical Therapy Treatment Patient Details Name: Brian Herring MRN: [id number] DOB: 05/20/1956 Today's Date: 10/20/2017    History of Present Illness Pt adm with rt foot ulcer with infection and now s/p rt BKA on 3/27. PMH - HTN, DM, CHF    PT Comments    Pt performed increased activity during session and performed multiple standing trials.  In standing patient performed L LE exercises followed by R LE strengthening in a seated position.  Pt required cues for safety and technique.  Plan for CIR later this pm.    Follow Up Recommendations  CIR     Equipment Recommendations  Rolling walker with 5" wheels;3in1 (PT);Wheelchair (measurements PT);Wheelchair cushion (measurements PT)    Recommendations for Other Services       Precautions / Restrictions Precautions Precautions: Fall Precaution Comments: questionable bed bugs wear hair bonnet and shoe covers Restrictions Weight Bearing Restrictions: Yes RLE Weight Bearing: Non weight bearing    Mobility  Bed Mobility               General bed mobility comments: Pt sitting in recliner on arrival.    Transfers Overall transfer level: Needs assistance Equipment used: Rolling walker (2 wheeled) Transfers: Sit to/from Stand Sit to Stand: Mod assist         General transfer comment: Cues for hand placement, assist to rise and steady.  Pt required increased time and effort.  Pt performed 4 min standing trial and 2 min standing trial.    Ambulation/Gait Ambulation/Gait assistance: (NT remains to lack strength for hop to pattern.  )               Stairs            Wheelchair Mobility    Modified Rankin (Stroke Patients Only)       Balance     Sitting balance-Leahy Scale: Good     Standing balance support: Bilateral upper extremity supported Standing balance-Leahy Scale: Poor Standing balance comment: unable to release walker in static standing                            Cognition  Arousal/Alertness: Awake/alert Behavior During Therapy: WFL for tasks assessed/performed Overall Cognitive Status: Within Functional Limits for tasks assessed                                        Exercises General Exercises - Lower Extremity Heel Raises: AROM;Left;10 reps;Standing Mini-Sqauts: AROM;Left;10 reps;Standing    General Comments        Pertinent Vitals/Pain Pain Assessment: 0-10 Pain Score: 5  Pain Location: RLE Pain Descriptors / Indicators: Burning Pain Intervention(s): Monitored during session;Repositioned;Patient requesting pain meds-RN notified;RN gave pain meds during session;Limited activity within patient's tolerance    Home Living                      Prior Function            PT Goals (current goals can now be found in the care plan section) Acute Rehab PT Goals Patient Stated Goal: Pt wants to return to independence and back to doing what he likes to do Potential to Achieve Goals: Good Progress towards PT goals: Progressing toward goals    Frequency    Min 3X/week      PT Plan Current plan remains appropriate  Co-evaluation              AM-PAC PT "6 Clicks" Daily Activity  Outcome Measure  Difficulty turning over in bed (including adjusting bedclothes, sheets and blankets)?: Unable Difficulty moving from lying on back to sitting on the side of the bed? : Unable Difficulty sitting down on and standing up from a chair with arms (e.g., wheelchair, bedside commode, etc,.)?: Unable Help needed moving to and from a bed to chair (including a wheelchair)?: A Little Help needed walking in hospital room?: A Lot Help needed climbing 3-5 steps with a railing? : Total 6 Click Score: 9    End of Session Equipment Utilized During Treatment: Gait belt Activity Tolerance: Patient tolerated treatment well Patient left: in chair;with call bell/phone within reach;with chair alarm set Nurse Communication: Mobility  status PT Visit Diagnosis: Unsteadiness on feet (R26.81);Other abnormalities of gait and mobility (R26.89)     Time: 1610-9604 PT Time Calculation (min) (ACUTE ONLY): 24 min  Charges:  $Gait Training: 8-22 mins $Therapeutic Activity: 8-22 mins                    G Codes:       Governor Rooks, PTA pager [phone]    Cristela Blue 10/20/2017, 2:18 PM

## 2017-10-20 NOTE — Progress Notes (Signed)
Kirsteins, [name], MD      Letta Pate [name], MD  Physician  Physical Medicine and Rehabilitation      Consult Note  Signed     Date of Service:  10/19/2017  2:36 PM         Related encounter: ED to Hosp-Admission (Current) from 10/12/2017 in [facility]             Signed          Expand All Collapse All            Expand widget buttonCollapse widget button     Hide copied text   Hover for detailscustomization button                                                                                                            untitled image              Physical Medicine and Rehabilitation Consult        Reason for Consult: L-BKA  Referring Physician: Dr. Starla Link        HPI: Brian Herring is a 62 y.o. male with history of T2DM, HTN, chronic systolic CHF who was admitted on 10/12/17 with infected foot ulcer with osteomyelitis. He was started on IV antibiotics and underwent right transtibial amputation. He has had issues with bloody stools felt to be due to ASA and Sq heparin. H/H stable. Chronic hyponateremia and progressive leucocytosis being monitored. Therapy evaluations done today and CIR recommended due to functional deficits. He sat up in a chair for 3 hours today and report that he has been getting to [facility].      Patient does not note any numbness in his foot.  He does have some tingling in his fingertips  Review of Systems   Constitutional: Negative for chills, fever and malaise/fatigue.   Eyes: Negative for blurred vision and double vision.   Respiratory: Negative for cough and shortness of breath.    Cardiovascular: Negative for chest pain and leg swelling.   Gastrointestinal: Negative for constipation, diarrhea, heartburn and nausea.   Genitourinary:  Negative for dysuria and urgency.   Musculoskeletal: Negative for back pain, myalgias and neck pain.   Skin: Negative for itching and rash.   Neurological: Negative for speech change and focal weakness.   Psychiatric/Behavioral: Negative for depression. The patient is not nervous/anxious.                  Past Medical History:    Diagnosis   Date    .   Chronic systolic CHF (congestive heart failure) ([place])        .   Diabetes mellitus without complication ([place])        .   Hyperlipidemia        .   Hypertension  Past Surgical History:    Procedure   Laterality   Date    .   AMPUTATION   Right   10/14/2017        Procedure: AMPUTATION  GREAT TOE;  Surgeon: [name], MD;  Location: [facility];  Service: Orthopedics;  Laterality: Right;    .   AMPUTATION   Right   10/18/2017        Procedure: RIGHT BELOW KNEE AMPUTATION;  Surgeon: [name], MD;  Location: [facility];  Service: Orthopedics;  Laterality: Right;    .   SHOULDER SURGERY       01/05                Family History    Problem   Relation   Age of Onset    .   Pneumonia   Mother                aspiration          Social History:  Lives alone. Disabled due to cardiac issues but independent without AD. Has friends who can check in after discharge. He  reports that he has never smoked. He has never used smokeless tobacco. He reports that he does not drink alcohol. His drug history is not on file.             Allergies    Allergen   Reactions    .   Sulfur   Rash                 Medications Prior to Admission    Medication   Sig   Dispense   Refill    .   acarbose (PRECOSE) 25 MG tablet   Take 1 tablet by mouth 3 (three) times daily with meals.             Marland Kitchen   aspirin 81 MG tablet   Take 81 mg by mouth every morning.             .   Empagliflozin  (JARDIANCE PO)   Take 25 mg by mouth every morning.             .   furosemide (LASIX) 40 MG tablet   Take 40 mg by mouth daily as needed.             Marland Kitchen   glimepiride (AMARYL) 4 MG tablet   Take 1 tablet by mouth every morning.        1    .   ibuprofen (ADVIL,MOTRIN) 200 MG tablet   Take 200 mg by mouth every 6 (six) hours as needed for fever, headache, mild pain or moderate pain.            .   metFORMIN (GLUCOPHAGE) 500 MG tablet   Take 1,000 mg by mouth 2 (two) times daily with a meal.             .   metoprolol succinate (TOPROL-XL) 50 MG 24 hr tablet   Take 1 tablet (50 mg total) by mouth daily. Take with or immediately following a meal. (Patient taking differently: Take 25 mg by mouth every evening. Take with or immediately following a meal.)   30 tablet   3    .   rosuvastatin (CRESTOR) 10 MG tablet   Take 10 mg by mouth every evening.             Marland Kitchen  sacubitril-valsartan (ENTRESTO) 97-103 MG   Take 1 tablet by mouth 2 (two) times daily.            Marland Kitchen   spironolactone (ALDACTONE) 25 MG tablet   Take 1 tablet (25 mg total) by mouth 2 (two) times daily. (Patient taking differently: Take 25 mg by mouth every evening. )   180 tablet   3    .   amLODipine (NORVASC) 5 MG tablet   Take 1 tablet (5 mg total) by mouth daily. (Patient not taking: Reported on 10/12/2017)   90 tablet   3    .   atorvastatin (LIPITOR) 80 MG tablet   Take 1 tablet (80 mg total) by mouth daily. (Patient not taking: Reported on 10/12/2017)   90 tablet   3    .   ivabradine ([name]) 7.5 MG TABS tablet   Take 1 tablet (7.5 mg total) by mouth 2 (two) times daily with a meal. (Patient not taking: Reported on 10/12/2017)   60 tablet   3    .   KOMBIGLYZE XR 2.11-998 MG TB24   Take 1 tablet by mouth 2 (two) times daily.       0    .   ramipril (ALTACE) 10 MG capsule   Take 1 capsule (10 mg total) by mouth  daily. (Patient not taking: Reported on 10/12/2017)   90 capsule   3          Home:  Home Living  Family/patient expects to be discharged to:: Private residence  Living Arrangements: Alone  Type of Home: House  Home Access: Stairs to enter  Technical brewer of Steps: 2  Entrance Stairs-Rails: Right, Left, Can reach both  Home Layout: One level  Home Equipment: None   Functional History:  Prior Function  Level of Independence: Independent  Functional Status:   Mobility:  Bed Mobility  Overal bed mobility: Needs Assistance  Bed Mobility: Supine to Sit  Supine to sit: Min guard, HOB elevated  General bed mobility comments: Incr time and effort   Transfers  Overall transfer level: Needs assistance  Equipment used: Rolling walker (2 wheeled)  Transfers: Sit to/from Stand, [employer] Transfers  Sit to Stand: Mod assist  Stand pivot transfers: Min assist  General transfer comment: Assist to bring hips up and for balance. Verbal cues for hand placement. Once standing with walker min assist for balance for pivot to chair. Pt pivoting on LLE and not able to achieve hopping.  Ambulation/Gait  General Gait Details: Unable       ADL:       Cognition:  Cognition  Overall Cognitive Status: Within Functional Limits for tasks assessed  Orientation Level: Oriented X4  Cognition  Arousal/Alertness: Awake/alert  Behavior During Therapy: WFL for tasks assessed/performed  Overall Cognitive Status: Within Functional Limits for tasks assessed        Blood pressure 108/69, pulse 86, temperature 98.1 F (36.7 C), temperature source Oral, resp. rate 20, height 6' (1.829 m), weight 104.7 kg (230 lb 13.2 oz), SpO2 97 %.  Physical Exam   Nursing note and vitals reviewed.  Constitutional: He is oriented to person, place, and time. He appears well-developed and well-nourished. No distress.   HENT:   Head: Normocephalic and  atraumatic.   Mouth/Throat: Oropharynx is clear and moist.   Eyes: Pupils are equal, round, and reactive to light. Conjunctivae and EOM are normal. Right eye exhibits no discharge. Left eye exhibits no discharge.   Neck:  Normal range of motion. Neck supple.   Cardiovascular: Normal rate and regular rhythm.   No murmur heard.  Respiratory: Effort normal and breath sounds normal. No stridor. No respiratory distress. He has no wheezes.   GI: Soft. Bowel sounds are normal. He exhibits no distension. There is no tenderness.  Musculoskeletal: He exhibits no tenderness.  R-BKA covered with Flo tech device. VAC underneath with bloody drainage.   Neurological: He is alert and oriented to person, place, and time.   Skin: Skin is warm and dry. He is not diaphoretic.  Psychiatric: He has a normal mood and affect. His behavior is normal. Judgment and thought content normal.   Left foot no lesions  Left foot has decreased sensation to light touch in the toes intact at the ankle  Motor strength is 5/5 bilateral biceps triceps deltoid grip, 4+ left hip flexor knee extensor ankle dorsiflexor.  4 at the right hip flexor     Lab Results Last 24 Hours  Imaging Results (Last 48 hours)          Assessment/Plan:  Diagnosis: Right below-knee amputation secondary to right foot osteomyelitis  1.Does the need for close, 24 hr/day medical supervision in concert with the patient's rehab needs make it unreasonable for this patient to be served in a less intensive setting? Yes   2.Co-Morbidities requiring supervision/potential complications: Type 2 diabetes with peripheral neuropathy, recent hematochezia   3.Due to bladder management, bowel management, safety, skin/wound care, disease management, medication administration, pain management and patient education, does the patient require 24 hr/day rehab nursing? Yes   4.Does the patient require coordinated care of a physician, rehab nurse, PT (1-2 hrs/day, 5 days/week), OT (1-2 hrs/day, 5 days/week) and SLP (.5-1 hrs/day, 5 days/week) to address physical and functional deficits in the context of the above medical diagnosis(es)? Yes Addressing deficits in the following areas: balance, endurance, locomotion, strength, transferring, bowel/bladder control, bathing, dressing, feeding, grooming, toileting and psychosocial support   5.Can the patient actively participate in an intensive therapy program of at least 3 hrs of therapy per day at least 5 days per week? Yes   6.The potential for patient to make measurable gains while on inpatient  rehab is excellent   7.Anticipated functional outcomes upon discharge from inpatient rehab are modified independent  with PT, modified independent with OT, n/a with SLP.   8.Estimated rehab length of stay to reach the above functional goals is: 8-14 days   9.Anticipated D/C setting: Home   10.Anticipated post D/C treatments: [place] therapy   11.Overall Rehab/Functional Prognosis: good      RECOMMENDATIONS:  This patient's condition is appropriate for continued rehabilitative care in the following setting: CIR  Patient has agreed to participate in recommended program. Yes  Note that insurance prior authorization may be required for reimbursement for recommended care.     Comment:      Charlett Blake M.D.  [facility]  FAAPM&R (Sports Med, Neuromuscular Med)  Diplomate Am Board of [employer], PA-C  10/19/2017                Revision History                                        Routing History

## 2017-10-20 NOTE — Progress Notes (Signed)
I met with pt at bedside to discuss inpt rehab admit and goals. Pt prefers CIR rather than SNF. I have insurance approval to admit and can admit today. I contacted Dr. Starla Link, RN Cm and SW. I will make the arrangements to admit today. [phone]

## 2017-10-20 NOTE — PMR Pre-admission (Signed)
PMR Admission Coordinator Pre-Admission Assessment  Patient: Brian Herring is an 62 y.o., male MRN: [id number] DOB: [birth year] Height: 6' (182.9 cm) Weight: 104.7 kg (230 lb 13.2 oz)              Insurance Information HMO:     PPO: yes     PCP:      IPA:      80/20:      OTHER:  PRIMARY: [employer] [place]      Policy#: [id number]      Subscriber: pt CM Name: Harrie Jeans      Phone#: [phone]     Fax#: [phone] Pre-Cert#: [id number] approved until 11/01/17 when updates are due      Employer: disabled Benefits:  Phone #: [phone]     Name: 10/20/2017 Eff. Date: 07/25/2017     Deduct: $7000      Out of Pocket Max: $7900 includes deductible      Life Max: none CIR: 60%      SNF: 60% 60 days Outpatient: $90 co pay per visit     Co-Pay: 30 visits each PT and OT Home Health: 60%      Co-Pay: visits per medical neccesity with prior Auth DME: 60%     Co-Pay: 40% Providers: in network  SECONDARY: none       Patient states his medicare should be active in next 1 to 2 months  Medicaid Application Date:       Case Manager:  Disability Application Date:       Case Worker:   Emergency Facilities manager Information    Name Relation Home Work [place]  [phone]     curry,gina Significant other   [phone]     Current Medical History  Patient Admitting Diagnosis:  Right BKA  History of Present Illness:   HPI: Brian Herring is a 62 y.o. male with history of T2DM, HTN, chronic systolic CHF who was admitted on 10/12/17 with infected foot ulcer with osteomyelitis. He was started on IV antibiotics and underwent right transtibial amputation. He has had issues with bloody stools felt to be due to ASA and Sq heparin. H/H stable. Chronic hyponatremia and progressive leucocytosis being monitored. Therapy evaluations done today and CIR recommended due to functional deficits. He sat up in a chair for 3 hours today and report that he has been getting to [facility].     Past  Medical History  Past Medical History:  Diagnosis Date  . Chronic systolic CHF (congestive heart failure) ([place])   . Diabetes mellitus without complication ([place])   . Hyperlipidemia   . Hypertension     Family History  family history includes Pneumonia in his mother.  Prior Rehab/Hospitalizations:  Has the patient had major surgery during 100 days prior to admission? No  Current Medications   Current Facility-Administered Medications:  .  0.9 %  sodium chloride infusion, , Intravenous, Continuous, [name], MD, Stopped at 10/19/17 1600 .  acetaminophen (TYLENOL) tablet 325-650 mg, 325-650 mg, Oral, Q6H PRN, [name], MD, 650 mg at 10/20/17 0939 .  atorvastatin (LIPITOR) tablet 80 mg, 80 mg, Oral, Daily, [name], MD, 80 mg at 10/20/17 0923 .  bisacodyl (DULCOLAX) suppository 10 mg, 10 mg, Rectal, Daily PRN, [name], MD .  docusate sodium (COLACE) capsule 100 mg, 100 mg, Oral, BID, [name], MD, 100 mg at 10/19/17 2242 .  feeding supplement (PRO-STAT SUGAR FREE 64) liquid 30 mL,  30 mL, Oral, BID, [name], MD, 30 mL at 10/20/17 0921 .  heparin injection 5,000 Units, 5,000 Units, Subcutaneous, Q8H, Starla Link, Kshitiz, MD, 5,000 Units at 10/20/17 6063 .  hydrALAZINE (APRESOLINE) injection 5 mg, 5 mg, Intravenous, Q2H PRN, [name], MD .  HYDROmorphone (DILAUDID) injection 0.5-1 mg, 0.5-1 mg, Intravenous, Q4H PRN, [name], MD, 1 mg at 10/20/17 0411 .  insulin aspart (novoLOG) injection 0-5 Units, 0-5 Units, Subcutaneous, QHS, [name], MD, 3 Units at 10/19/17 2242 .  insulin aspart (novoLOG) injection 0-9 Units, 0-9 Units, Subcutaneous, TID WC, [name], MD, 2 Units at 10/20/17 [phone]  insulin aspart (novoLOG) injection 5 Units, 5 Units, Subcutaneous, TID WC, Starla Link, Kshitiz, MD, 5 Units at 10/20/17 0921 .  insulin glargine (LANTUS) injection 15 Units, 15 Units, Subcutaneous, QHS, Alekh, Kshitiz, MD .  lactated ringers infusion, ,  Intravenous, Continuous, [name], MD, Stopped at 10/19/17 1600 .  living well with diabetes book MISC, , Does not apply, Once, [name], MD .  loratadine (CLARITIN) tablet 10 mg, 10 mg, Oral, Daily, [name], MD, 10 mg at 10/20/17 1093 .  magnesium citrate solution 1 Bottle, 1 Bottle, Oral, Once PRN, [name], MD .  methocarbamol (ROBAXIN) tablet 500 mg, 500 mg, Oral, Q6H PRN **OR** methocarbamol (ROBAXIN) 500 mg in dextrose 5 % 50 mL IVPB, 500 mg, Intravenous, Q6H PRN, [name], MD .  metoCLOPramide (REGLAN) tablet 5-10 mg, 5-10 mg, Oral, Q8H PRN **OR** metoCLOPramide (REGLAN) injection 5-10 mg, 5-10 mg, Intravenous, Q8H PRN, [name], MD .  metoprolol succinate (TOPROL-XL) 24 hr tablet 25 mg, 25 mg, Oral, QPM, [name], MD, 25 mg at 10/19/17 1725 .  ondansetron (ZOFRAN) injection 4 mg, 4 mg, Intravenous, Q8H PRN, [name], MD, 4 mg at 10/13/17 1644 .  ondansetron (ZOFRAN) tablet 4 mg, 4 mg, Oral, Q6H PRN **OR** ondansetron (ZOFRAN) injection 4 mg, 4 mg, Intravenous, Q6H PRN, [name], MD .  oxyCODONE (Oxy IR/ROXICODONE) immediate release tablet 10-15 mg, 10-15 mg, Oral, Q4H PRN, [name], MD, 15 mg at 10/20/17 0230 .  oxyCODONE (Oxy IR/ROXICODONE) immediate release tablet 5-10 mg, 5-10 mg, Oral, Q4H PRN, [name], MD, 10 mg at 10/19/17 1133 .  polyethylene glycol (MIRALAX / GLYCOLAX) packet 17 g, 17 g, Oral, Daily PRN, [name], MD .  polyethylene glycol (MIRALAX / GLYCOLAX) packet 17 g, 17 g, Oral, Daily PRN, [name], MD .  promethazine (PHENERGAN) injection 12.5 mg, 12.5 mg, Intravenous, Q6H PRN, [name], MD, 12.5 mg at 10/13/17 2239 .  saccharomyces boulardii (FLORASTOR) capsule 250 mg, 250 mg, Oral, BID, [name], MD, 250 mg at 10/20/17 2355 .  sacubitril-valsartan (ENTRESTO) 97-103 mg per tablet, 1 tablet, Oral, BID, [name], MD, 1 tablet at 10/20/17 [phone] .  sodium chloride (OCEAN) 0.65 % nasal  spray 1 spray, 1 spray, Each Nare, PRN, [name], MD .  zolpidem (AMBIEN) tablet 5 mg, 5 mg, Oral, QHS PRN, [name], MD, 5 mg at 10/19/17 2242  Patients Current Diet: Diet Carb Modified Fluid consistency: Thin; Room service appropriate? Yes Diet - low sodium heart healthy Diet Carb Modified  Precautions / Restrictions Precautions Precautions: Fall Restrictions Weight Bearing Restrictions: Yes RLE Weight Bearing: Non weight bearing   Has the patient had 2 or more falls or a fall with injury in the past year?No  Prior Activity Level Community (5-7x/wk): independent without AD and driving; disable due to heart issues 1 1/2 years ago  Development worker, international aid / [place] Devices/Equipment: Eyeglasses Home Equipment: Grab bars - tub/shower  Prior Device Use: Indicate devices/aids used by the patient prior to current illness, exacerbation or injury? None of the above  Prior Functional Level Prior Function Level of Independence: Independent Comments: drives  Self Care: Did the patient need help bathing, dressing, using the toilet or eating?  Independent  Indoor Mobility: Did the patient need assistance with walking from room to room (with or without device)? Independent  Stairs: Did the patient need assistance with internal or external stairs (with or without device)? Independent  Functional Cognition: Did the patient need help planning regular tasks such as shopping or remembering to take medications? Independent  Current Functional Level Cognition  Overall Cognitive Status: Within Functional Limits for tasks assessed Orientation Level: Oriented X4    Extremity Assessment (includes Sensation/Coordination)  Upper Extremity Assessment: Overall WFL for tasks assessed  Lower Extremity Assessment: Defer to PT evaluation RLE Deficits / Details: BKA, AROM WFL, Strength at least 3/5    ADLs  Overall ADL's : Needs assistance/impaired Eating/Feeding:  Independent, Bed level Grooming: Set up, Sitting, Wash/dry hands, Wash/dry face, Oral care Upper Body Bathing: Set up, Sitting Lower Body Bathing: Moderate assistance, Sitting/lateral leans Upper Body Dressing : Set up, Sitting Lower Body Dressing: Moderate assistance, Sitting/lateral leans Toilet Transfer: Moderate assistance, Stand-pivot, RW, BSC Toileting- Clothing Manipulation and Hygiene: Moderate assistance, Sitting/lateral lean Functional mobility during ADLs: (unable to hop with RW) General ADL Comments: began educating pt in compensatory techniques for ADL and IADL until pt receives prosthesis    Mobility  Overal bed mobility: Needs Assistance Bed Mobility: Supine to Sit Supine to sit: Supervision, HOB elevated General bed mobility comments: Incr time and effort     Transfers  Overall transfer level: Needs assistance Equipment used: Rolling walker (2 wheeled) Transfers: Lateral/Scoot Transfers, Sit to/from Stand Sit to Stand: Mod assist Stand pivot transfers: Min assist  Lateral/Scoot Transfers: Min guard General transfer comment: cues for hand placement, assist to rise and steady, increased effort, pt able to laterally transfer with min guard assist to drop arm recliner    Ambulation / Gait / Stairs / Wheelchair Mobility  Ambulation/Gait General Gait Details: Unable    Posture / Balance Balance Overall balance assessment: Needs assistance Sitting-balance support: No upper extremity supported, Feet supported Sitting balance-Leahy Scale: Good Standing balance support: Bilateral upper extremity supported Standing balance-Leahy Scale: Poor Standing balance comment: unable to release walker in static standing    Special needs/care consideration BiPAP/CPAP  N/a CPM  N/a Continuous Drip IV n/a Dialysis  N/a Life Vest  N/a Oxygen  N/a Special Bed  N/a Trach Size  N/a Wound Vac yes to surgical site Skin surgical incision with dressing Bowel mgmt: continent LBM  3/27 Bladder mgmt: continent Diabetic mgmt yes pta on oral meds only   Previous Home Environment Living Arrangements: Alone Available Help at Discharge: Friend(s), Available PRN/intermittently Type of Home: [place]: One level Home Access: Stairs to enter Entrance Stairs-Rails: Right, Left, Can reach both Entrance Stairs-Number of Steps: 2 Bathroom Shower/Tub: Multimedia programmer: Standard Bathroom Accessibility: Yes How Accessible: Accessible via walker [place]: No  Discharge Living Setting Plans for Discharge Living Setting: Patient's home, Alone Type of Home at Discharge: House Discharge Home Layout: One level Discharge Home Access: Stairs to enter Entrance Stairs-Rails: Right, Left, Can  reach both Entrance Stairs-Number of Steps: 2 Discharge Bathroom Shower/Tub: Walk-in shower Discharge Bathroom Toilet: Standard Discharge Bathroom Accessibility: Yes How Accessible: Accessible via walker Does the patient have any problems obtaining your medications?: No  Social/Family/Support Systems Patient Roles: Partner(girlfriend, Barnett Applebaum) Contact Information: Barnett Applebaum , girlfriend is [employer] Anticipated Caregiver: Gina intermittently Anticipated Ambulance person Information: see above Ability/Limitations of Caregiver: GF works Careers adviser: Intermittent Discharge Plan Discussed with Primary Caregiver: Yes Is Caregiver In Agreement with Plan?: Yes Does Caregiver/Family have Issues with Lodging/Transportation while Pt is in Rehab?: No  Goals/Additional Needs Patient/Family Goal for Rehab: Mod I with PT and OT Expected length of stay: ELOS 8 to 14 days Pt/Family Agrees to Admission and willing to participate: Yes Program Orientation Provided & Reviewed with Pt/Caregiver Including Roles  & Responsibilities: Yes  Decrease burden of Care through IP rehab admission: n/a  Possible need for SNF placement upon discharge: not anticipated  Patient  Condition: This patient's condition remains as documented in the consult dated 10/19/2017, in which the Rehabilitation Physician determined and documented that the patient's condition is appropriate for intensive rehabilitative care in an inpatient rehabilitation facility. Will admit to inpatient rehab today.  Preadmission Screen Completed By:  Cleatrice Burke, 10/20/2017 12:24 PM ______________________________________________________________________   Discussed status with Dr. Naaman Plummer on 10/20/2017 at  32 and received telephone approval for admission today.  Admission Coordinator:  Cleatrice Burke, time 0626 Date 10/20/2017

## 2017-10-20 NOTE — Progress Notes (Signed)
Patient discharging to CIR. CSW signing off.  Percell Locus Gloriana Piltz LCSW [phone]

## 2017-10-20 NOTE — Progress Notes (Signed)
Cristina Gong, RN  Rehab Admission Coordinator  Physical Medicine and Rehabilitation  PMR Pre-admission  Signed  Date of Service:  10/20/2017 12:24 PM       Related encounter: ED to Hosp-Admission (Current) from 10/12/2017 in [facility]      Signed           [] Hide copied text  [] Hover for details   PMR Admission Coordinator Pre-Admission Assessment  Patient: Brian Herring is an 62 y.o., male MRN: [id number] DOB: July 08, 1956 Height: 6' (182.9 cm) Weight: 104.7 kg (230 lb 13.2 oz)                                                                                                                                                  Insurance Information HMO:     PPO: yes     PCP:      IPA:      80/20:      OTHER:  PRIMARY: [employer] [place]      Policy#: [id number]      Subscriber: pt CM Name: Harrie Jeans      Phone#: [phone]     Fax#: [phone] Pre-Cert#: [id number] approved until 11/01/17 when updates are due      Employer: disabled Benefits:  Phone #: [phone]     Name: 10/20/2017 Eff. Date: 07/25/2017     Deduct: $7000      Out of Pocket Max: $7900 includes deductible      Life Max: none CIR: 60%      SNF: 60% 60 days Outpatient: $90 co pay per visit     Co-Pay: 30 visits each PT and OT Home Health: 60%      Co-Pay: visits per medical neccesity with prior Auth DME: 60%     Co-Pay: 40% Providers: in network  SECONDARY: none       Patient states his medicare should be active in next 1 to 2 months  Medicaid Application Date:       Case Manager:  Disability Application Date:       Case Worker:   Emergency Contact Information         Contact Information    Name Relation Home Work [place]  [phone]     curry,gina Significant other   [phone]     Current Medical History  Patient Admitting Diagnosis:  Right BKA  History of Present Illness:   MLY:YTKPTW M Gambleis a 61 y.o.malewith history of T2DM, HTN,  chronic systolic CHF who was admitted on 10/12/17 with infected foot ulcer with osteomyelitis. He was started on IV antibiotics and underwent right transtibial amputation. He has had issues with bloody stools felt to be due to ASA and Sq heparin. H/H stable. Chronic hyponatremia and progressive leucocytosis being monitored. Therapy evaluations done today and CIR recommended due to functional  deficits.He sat up in a chair for 3 hours today and report that he has been getting to [facility].   Past Medical History      Past Medical History:  Diagnosis Date  . Chronic systolic CHF (congestive heart failure) ([place])   . Diabetes mellitus without complication ([place])   . Hyperlipidemia   . Hypertension     Family History  family history includes Pneumonia in his mother.  Prior Rehab/Hospitalizations:  Has the patient had major surgery during 100 days prior to admission? No  Current Medications   Current Facility-Administered Medications:  .  0.9 %  sodium chloride infusion, , Intravenous, Continuous, [name], MD, Stopped at 10/19/17 1600 .  acetaminophen (TYLENOL) tablet 325-650 mg, 325-650 mg, Oral, Q6H PRN, [name], MD, 650 mg at 10/20/17 0939 .  atorvastatin (LIPITOR) tablet 80 mg, 80 mg, Oral, Daily, [name], MD, 80 mg at 10/20/17 0923 .  bisacodyl (DULCOLAX) suppository 10 mg, 10 mg, Rectal, Daily PRN, [name], MD .  docusate sodium (COLACE) capsule 100 mg, 100 mg, Oral, BID, [name], MD, 100 mg at 10/19/17 2242 .  feeding supplement (PRO-STAT SUGAR FREE 64) liquid 30 mL, 30 mL, Oral, BID, [name], MD, 30 mL at 10/20/17 0921 .  heparin injection 5,000 Units, 5,000 Units, Subcutaneous, Q8H, Alekh, Kshitiz, MD, 5,000 Units at 10/20/17 3664 .  hydrALAZINE (APRESOLINE) injection 5 mg, 5 mg, Intravenous, Q2H PRN, [name], MD .  HYDROmorphone (DILAUDID) injection 0.5-1 mg, 0.5-1 mg, Intravenous, Q4H PRN, [name], MD, 1 mg at 10/20/17  0411 .  insulin aspart (novoLOG) injection 0-5 Units, 0-5 Units, Subcutaneous, QHS, [name], MD, 3 Units at 10/19/17 2242 .  insulin aspart (novoLOG) injection 0-9 Units, 0-9 Units, Subcutaneous, TID WC, [name], MD, 2 Units at 10/20/17 [phone] .  insulin aspart (novoLOG) injection 5 Units, 5 Units, Subcutaneous, TID WC, Starla Link, Kshitiz, MD, 5 Units at 10/20/17 0921 .  insulin glargine (LANTUS) injection 15 Units, 15 Units, Subcutaneous, QHS, Alekh, Kshitiz, MD .  lactated ringers infusion, , Intravenous, Continuous, [name], MD, Stopped at 10/19/17 1600 .  living well with diabetes book MISC, , Does not apply, Once, [name], MD .  loratadine (CLARITIN) tablet 10 mg, 10 mg, Oral, Daily, [name], MD, 10 mg at 10/20/17 7425 .  magnesium citrate solution 1 Bottle, 1 Bottle, Oral, Once PRN, [name], MD .  methocarbamol (ROBAXIN) tablet 500 mg, 500 mg, Oral, Q6H PRN **OR** methocarbamol (ROBAXIN) 500 mg in dextrose 5 % 50 mL IVPB, 500 mg, Intravenous, Q6H PRN, [name], MD .  metoCLOPramide (REGLAN) tablet 5-10 mg, 5-10 mg, Oral, Q8H PRN **OR** metoCLOPramide (REGLAN) injection 5-10 mg, 5-10 mg, Intravenous, Q8H PRN, [name], MD .  metoprolol succinate (TOPROL-XL) 24 hr tablet 25 mg, 25 mg, Oral, QPM, [name], MD, 25 mg at 10/19/17 1725 .  ondansetron (ZOFRAN) injection 4 mg, 4 mg, Intravenous, Q8H PRN, [name], MD, 4 mg at 10/13/17 1644 .  ondansetron (ZOFRAN) tablet 4 mg, 4 mg, Oral, Q6H PRN **OR** ondansetron (ZOFRAN) injection 4 mg, 4 mg, Intravenous, Q6H PRN, [name], MD .  oxyCODONE (Oxy IR/ROXICODONE) immediate release tablet 10-15 mg, 10-15 mg, Oral, Q4H PRN, [name], MD, 15 mg at 10/20/17 0230 .  oxyCODONE (Oxy IR/ROXICODONE) immediate release tablet 5-10 mg, 5-10 mg, Oral, Q4H PRN, [name], MD, 10  mg at 10/19/17 1133 .  polyethylene glycol (MIRALAX / GLYCOLAX) packet 17 g, 17 g, Oral, Daily PRN, [name],  MD .  polyethylene glycol (MIRALAX / GLYCOLAX) packet 17 g, 17 g, Oral, Daily PRN, [name], MD .  promethazine (PHENERGAN) injection 12.5 mg, 12.5 mg, Intravenous, Q6H PRN, [name], MD, 12.5 mg at 10/13/17 2239 .  saccharomyces boulardii (FLORASTOR) capsule 250 mg, 250 mg, Oral, BID, [name], MD, 250 mg at 10/20/17 5573 .  sacubitril-valsartan (ENTRESTO) 97-103 mg per tablet, 1 tablet, Oral, BID, [name], MD, 1 tablet at 10/20/17 [phone] .  sodium chloride (OCEAN) 0.65 % nasal spray 1 spray, 1 spray, Each Nare, PRN, [name], MD .  zolpidem (AMBIEN) tablet 5 mg, 5 mg, Oral, QHS PRN, [name], MD, 5 mg at 10/19/17 2242  Patients Current Diet: Diet Carb Modified Fluid consistency: Thin; Room service appropriate? 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 None of the above  Prior Functional Level Prior Function Level of Independence: Independent Comments: drives  Self Care: Did the patient need help bathing, dressing, using the toilet or eating?  Independent  Indoor Mobility: Did the patient need assistance with walking from room to room (with or without device)? Independent  Stairs: Did the patient need assistance with internal or external stairs (with or without device)? Independent  Functional Cognition: Did the patient need help planning regular  tasks such as shopping or remembering to take medications? Independent  Current Functional Level Cognition  Overall Cognitive Status: Within Functional Limits for tasks assessed Orientation Level: Oriented X4    Extremity Assessment (includes Sensation/Coordination)  Upper Extremity Assessment: Overall WFL for tasks assessed  Lower Extremity Assessment: Defer to PT evaluation RLE Deficits / Details: BKA, AROM WFL, Strength at least 3/5    ADLs  Overall ADL's : Needs assistance/impaired Eating/Feeding: Independent, Bed level Grooming: Set up, Sitting, Wash/dry hands, Wash/dry face, Oral care Upper Body Bathing: Set up, Sitting Lower Body Bathing: Moderate assistance, Sitting/lateral leans Upper Body Dressing : Set up, Sitting Lower Body Dressing: Moderate assistance, Sitting/lateral leans Toilet Transfer: Moderate assistance, Stand-pivot, RW, BSC Toileting- Clothing Manipulation and Hygiene: Moderate assistance, Sitting/lateral lean Functional mobility during ADLs: (unable to hop with RW) General ADL Comments: began educating pt in compensatory techniques for ADL and IADL until pt receives prosthesis    Mobility  Overal bed mobility: Needs Assistance Bed Mobility: Supine to Sit Supine to sit: Supervision, HOB elevated General bed mobility comments: Incr time and effort     Transfers  Overall transfer level: Needs assistance Equipment used: Rolling walker (2 wheeled) Transfers: Lateral/Scoot Transfers, Sit to/from Stand Sit to Stand: Mod assist Stand pivot transfers: Min assist  Lateral/Scoot Transfers: Min guard General transfer comment: cues for hand placement, assist to rise and steady, increased effort, pt able to laterally transfer with min guard assist to drop arm recliner    Ambulation / Gait / Stairs /  Wheelchair Mobility  Ambulation/Gait General Gait Details: Unable    Posture / Balance Balance Overall balance assessment: Needs  assistance Sitting-balance support: No upper extremity supported, Feet supported Sitting balance-Leahy Scale: Good Standing balance support: Bilateral upper extremity supported Standing balance-Leahy Scale: Poor Standing balance comment: unable to release walker in static standing    Special needs/care consideration BiPAP/CPAP  N/a CPM  N/a Continuous Drip IV n/a Dialysis  N/a Life Vest  N/a Oxygen  N/a Special Bed  N/a Trach Size  N/a Wound Vac yes to surgical site Skin surgical incision with dressing Bowel mgmt: continent LBM 3/27 Bladder mgmt: continent Diabetic mgmt yes pta on oral meds only   Previous Home Environment Living Arrangements: Alone Available Help at Discharge: Friend(s), Available PRN/intermittently Type of Home: House Home Layout: One level Home Access: Stairs to enter Entrance Stairs-Rails: Right, Left, Can reach both Entrance Stairs-Number of Steps: 2 Bathroom Shower/Tub: Multimedia programmer: Standard Bathroom Accessibility: Yes How Accessible: Accessible via walker Home Care Services: No  Discharge Living Setting Plans for Discharge Living Setting: Patient's home, Alone Type of Home at Discharge: House Discharge Home Layout: One level Discharge Home Access: Stairs to enter Entrance Stairs-Rails: Right, Left, Can reach both Entrance Stairs-Number of Steps: 2 Discharge Bathroom Shower/Tub: Walk-in shower Discharge Bathroom Toilet: Standard Discharge Bathroom Accessibility: Yes How Accessible: Accessible via walker Does the patient have any problems obtaining your medications?: No  Social/Family/Support Systems Patient Roles: Partner(girlfriend, Barnett Applebaum) Contact Information: Barnett Applebaum , girlfriend is [employer] Anticipated Caregiver: Gina intermittently Anticipated Ambulance person Information: see above Ability/Limitations of Caregiver: GF works Careers adviser: Intermittent Discharge Plan Discussed with Primary Caregiver: Yes Is  Caregiver In Agreement with Plan?: Yes Does Caregiver/Family have Issues with Lodging/Transportation while Pt is in Rehab?: No  Goals/Additional Needs Patient/Family Goal for Rehab: Mod I with PT and OT Expected length of stay: ELOS 8 to 14 days Pt/Family Agrees to Admission and willing to participate: Yes Program Orientation Provided & Reviewed with Pt/Caregiver Including Roles  & Responsibilities: Yes  Decrease burden of Care through IP rehab admission: n/a  Possible need for SNF placement upon discharge: not anticipated  Patient Condition: This patient's condition remains as documented in the consult dated 10/19/2017, in which the Rehabilitation Physician determined and documented that the patient's condition is appropriate for intensive rehabilitative care in an inpatient rehabilitation facility. Will admit to inpatient rehab today.  Preadmission Screen Completed By:  Cleatrice Burke, 10/20/2017 12:24 PM ______________________________________________________________________   Discussed status with Dr. Naaman Plummer on 10/20/2017 at  44 and received telephone approval for admission today.  Admission Coordinator:  Cleatrice Burke, time 9798 Date 10/20/2017             Cosigned by: Meredith Staggers, MD at 10/20/2017 12:32 PM  Revision History

## 2017-10-21 ENCOUNTER — Inpatient Hospital Stay (HOSPITAL_COMMUNITY): Payer: BLUE CROSS/BLUE SHIELD | Admitting: Physical Therapy

## 2017-10-21 ENCOUNTER — Inpatient Hospital Stay (HOSPITAL_COMMUNITY): Payer: BLUE CROSS/BLUE SHIELD | Admitting: Occupational Therapy

## 2017-10-21 DIAGNOSIS — S88119S Complete traumatic amputation at level between knee and ankle, unspecified lower leg, sequela: Secondary | ICD-10-CM

## 2017-10-21 LAB — COMPREHENSIVE METABOLIC PANEL
ALK PHOS: 89 U/L (ref 38–126)
ALT: 34 U/L (ref 17–63)
AST: 33 U/L (ref 15–41)
Albumin: 2.3 g/dL — ABNORMAL LOW (ref 3.5–5.0)
Anion gap: 7 (ref 5–15)
BUN: 20 mg/dL (ref 6–20)
CALCIUM: 8.7 mg/dL — AB (ref 8.9–10.3)
CO2: 27 mmol/L (ref 22–32)
Chloride: 103 mmol/L (ref 101–111)
Creatinine, Ser: 0.79 mg/dL (ref 0.61–1.24)
GFR calc non Af Amer: >60 mL/min (ref >60)
Glucose, Bld: 146 mg/dL — ABNORMAL HIGH (ref 65–99)
Potassium: 4.6 mmol/L (ref 3.5–5.1)
SODIUM: 137 mmol/L (ref 135–145)
TOTAL PROTEIN: 6 g/dL — AB (ref 6.5–8.1)
Total Bilirubin: 0.5 mg/dL (ref 0.3–1.2)

## 2017-10-21 LAB — CBC WITH DIFFERENTIAL/PLATELET
Basophils Absolute: 0 10*3/uL (ref 0.0–0.1)
Basophils Relative: 0 %
EOS ABS: 0.1 10*3/uL (ref 0.0–0.7)
EOS PCT: 1 %
HCT: 31.7 % — ABNORMAL LOW (ref 39.0–52.0)
HEMOGLOBIN: 10 g/dL — AB (ref 13.0–17.0)
LYMPHS ABS: 3.1 10*3/uL (ref 0.7–4.0)
Lymphocytes Relative: 27 %
MCH: 29.5 pg (ref 26.0–34.0)
MCHC: 31.5 g/dL (ref 30.0–36.0)
MCV: 93.5 fL (ref 78.0–100.0)
MONOS PCT: 5 %
Monocytes Absolute: 0.5 10*3/uL (ref 0.1–1.0)
Neutro Abs: 7.6 10*3/uL (ref 1.7–7.7)
Neutrophils Relative %: 67 %
PLATELETS: 480 10*3/uL — AB (ref 150–400)
RBC: 3.39 MIL/uL — ABNORMAL LOW (ref 4.22–5.81)
RDW: 15.1 % (ref 11.5–15.5)
WBC: 11.4 10*3/uL — ABNORMAL HIGH (ref 4.0–10.5)

## 2017-10-21 LAB — GLUCOSE, CAPILLARY
GLUCOSE-CAPILLARY: 179 mg/dL — AB (ref 65–99)
GLUCOSE-CAPILLARY: 231 mg/dL — AB (ref 65–99)
Glucose-Capillary: 150 mg/dL — ABNORMAL HIGH (ref 65–99)
Glucose-Capillary: 203 mg/dL — ABNORMAL HIGH (ref 65–99)

## 2017-10-21 MED ORDER — TAMSULOSIN HCL 0.4 MG PO CAPS
0.4000 mg | ORAL_CAPSULE | Freq: Every day | ORAL | Status: DC
  Administered 2017-10-21 – 2017-10-24 (×4): 0.4 mg via ORAL
  Filled 2017-10-21 (×4): qty 1

## 2017-10-21 MED ORDER — JUVEN PO PACK
1.0000 | PACK | Freq: Two times a day (BID) | ORAL | Status: DC
  Administered 2017-10-21 – 2017-10-30 (×18): 1 via ORAL
  Filled 2017-10-21 (×23): qty 1

## 2017-10-21 NOTE — Evaluation (Signed)
Physical Therapy Assessment and Plan  Patient Details  Name: Brian Herring MRN: [id number] Date of Birth: 1956/07/24  PT Diagnosis: Abnormality of gait, Difficulty walking and Pain in R residual limb Rehab Potential: Excellent ELOS: 7-10 days   Today's Date: 10/21/2017 PT Individual Time: 0930-1100 PT Individual Time Calculation (min): 90 min    Problem List:  Patient Active Problem List   Diagnosis Date Noted  . Status post below knee amputation, right ([place]) 10/20/2017  . Unilateral complete BKA ([place]) 10/20/2017  . Sepsis ([place]) 10/12/2017  . Diabetic foot ulcer ([place]) 10/12/2017  . Diabetic foot infection ([place]) 10/12/2017  . Hyponatremia 10/12/2017  . Cellulitis of right foot 10/12/2017  . Chronic systolic CHF (congestive heart failure) ([place])   . Hyperkalemia 10/13/2015  . Congestive dilated cardiomyopathy ([place]) 07/13/2015  . Asymptomatic LV dysfunction 06/25/2015  . Poorly controlled diabetes mellitus ([place]) 05/31/2015  . HTN (hypertension), benign 07/04/2014  . Viral cardiomyopathy, EF improved to 45-50% 04/02/2013  . Obesity 04/02/2013  . Diabetic foot ulcer with osteomyelitis ([place]) 04/02/2013  . Hyperlipidemia 04/02/2013  . Sinus tachycardia 04/02/2013    Past Medical History:  Past Medical History:  Diagnosis Date  . Chronic systolic CHF (congestive heart failure) ([place])   . Diabetes mellitus without complication ([place])   . Hyperlipidemia   . Hypertension    Past Surgical History:  Past Surgical History:  Procedure Laterality Date  . AMPUTATION Right 10/14/2017   Procedure: AMPUTATION  GREAT TOE;  Surgeon: [name], MD;  Location: [facility];  Service: Orthopedics;  Laterality: Right;  . AMPUTATION Right 10/18/2017   Procedure: RIGHT BELOW KNEE AMPUTATION;  Surgeon: [name], MD;  Location: [facility];  Service: Orthopedics;  Laterality: Right;  . SHOULDER SURGERY  01/05    Assessment & Plan Clinical Impression: Brian Herring is a 62 y.o. male with history  of T2DM, HTN, chronic systolic CHF who was admitted on 10/12/17 with infected foot ulcer with osteomyelitis. He was started on IV antibiotics and underwent right transtibial amputation. He has had issues with bloody stools felt to be due to ASA and Sq heparin. H/H stable. Mild hyponateremia noted and reactive leucocytosis resolving. Pain has been reasonably controlled. Therapy evaluations done today and CIR recommended due to functional deficits. Patient transferred to CIR on 10/20/2017 .   Patient currently requires min with mobility secondary to muscle weakness and muscle joint tightness and decreased sitting balance, decreased standing balance and decreased balance strategies.  Prior to hospitalization, patient was independent  with mobility and lived with Alone in a House home.  Home access is 2Stairs to enter.  Patient will benefit from skilled PT intervention to maximize safe functional mobility, minimize fall risk and decrease caregiver burden for planned discharge home alone.  Anticipate patient will benefit from follow up [place] at discharge.  PT - End of Session Activity Tolerance: Tolerates 30+ min activity with multiple rests Endurance Deficit: Yes Endurance Deficit Description: fatigues quickly with mobility activities PT Assessment Rehab Potential (ACUTE/IP ONLY): Excellent PT Barriers to Discharge: Weight bearing restrictions;Wound Care;Home environment access/layout;Decreased caregiver support PT Patient demonstrates impairments in the following area(s): Warehouse manager;Endurance;Motor;Pain;Safety;Edema PT Transfers Functional Problem(s): Bed Mobility;Bed to Chair;Car;Furniture PT Locomotion Functional Problem(s): Ambulation;Wheelchair Mobility;Stairs PT Plan PT Intensity: Minimum of 1-2 x/day ,45 to 90 minutes PT Frequency: 5 out of 7 days PT Duration Estimated Length of Stay: 7-10 days PT Treatment/Interventions: Ambulation/gait training;Discharge planning;Functional mobility  training;Psychosocial support;Therapeutic Activities;Wheelchair propulsion/positioning;Therapeutic Exercise;Skin care/wound management;Neuromuscular re-education;Disease management/prevention;Balance/vestibular training;Cognitive remediation/compensation;DME/adaptive  equipment instruction;Pain management;Splinting/orthotics;UE/LE Strength taining/ROM;UE/LE Coordination activities;Stair training;Patient/family education;Community reintegration PT Transfers Anticipated Outcome(s): modI PT Locomotion Anticipated Outcome(s): modI w/c level, S ambulation and stairs PT Recommendation Recommendations for Other Services: Neuropsych consult Follow Up Recommendations: Home health PT Patient destination: Home Equipment Recommended: Wheelchair (measurements);Wheelchair cushion (measurements);Rolling walker with 5" wheels  Skilled Therapeutic Intervention Pt received seated in w/c, denies pain at rest and following recent medication, agreeable to treatment. Assessed mobility as described below with minA overall. Educated pt in LE strengthening exercises and provided handout; discussed importance of positioning and stretching including prone lying to maintain muscle length for appropriate prosthetic fit/use when residual limb heals. Educated pt in rehab process, goals, estimated length of stay to be determine when evaluations completed. Remained seated in w/c at end of session, all needs in reach.   PT Evaluation Precautions/Restrictions Precautions Precautions: Fall Restrictions Weight Bearing Restrictions: Yes RLE Weight Bearing: Non weight bearing General Chart Reviewed: Yes PT Missed Treatment Reason: Not applicable Response to Previous Treatment: Patient reporting fatigue but able to participate. Family/Caregiver Present: No  Pain Pain Assessment Pain Scale: 0-10 Pain Score: 0-No pain Pain Type: Acute pain Pain Location: Leg Pain Orientation: Right Pain Descriptors / Indicators:  Aching;Constant Pain Frequency: Constant Pain Onset: On-going Patients Stated Pain Goal: 2 Pain Intervention(s): Medication (See eMAR) Home Living/Prior Functioning Home Living Available Help at Discharge: Friend(s);Available PRN/intermittently Type of Home: House Home Access: Stairs to enter [employer] of Steps: 2 Entrance Stairs-Rails: Right;Left;Can reach both Home Layout: One level Bathroom Shower/Tub: Multimedia programmer: Standard Bathroom Accessibility: Yes  Lives With: Alone Prior Function Level of Independence: Independent with basic ADLs;Independent with gait;Independent with homemaking with ambulation;Independent with transfers  Able to Take Stairs?: Yes Driving: Yes 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: Within [place]: Intact  Cognition Overall Cognitive Status: Within Functional Limits for tasks assessed Orientation Level: Oriented X4 Memory: Appears intact Awareness: Appears intact Problem Solving: Appears intact Safety/Judgment: Appears intact Sensation Sensation Light Touch: Appears Intact Stereognosis: Not tested Hot/Cold: Not tested Proprioception: Not tested Coordination Gross Motor Movements are Fluid and Coordinated: Yes Heel Shin Test: NT d/t amputation Motor  Motor Motor: Within Functional Limits Motor - Skilled Clinical Observations: mild generalized weakness, limited by R residual limb pain  Mobility Bed Mobility Bed Mobility: Sit to Supine;Supine to Sit;Rolling Right;Rolling Left Rolling Right: 5: Supervision Rolling Right Details: Verbal cues for technique Rolling Left: 5: Supervision Rolling Left Details: Verbal cues for technique Supine to Sit: 5: Supervision Supine to Sit Details: Verbal cues for technique Sit to Supine: 5: Supervision Sit to Supine - Details: Verbal cues for technique Transfers Transfers: Yes Sit to Stand: 4: Min guard;With armrests;4:  Min assist Sit to Stand Details: Verbal cues for technique;Verbal cues for precautions/safety;Verbal cues for safe use of DME/AE;Tactile cues for weight shifting Stand Pivot Transfers: 4: Min assist;4: Min guard Stand Pivot Transfer Details: Verbal cues for technique;Verbal cues for precautions/safety;Verbal cues for safe use of DME/AE Locomotion  Ambulation Ambulation: Yes Ambulation/Gait Assistance: 4: Min guard Ambulation Distance (Feet): 8 Feet Assistive device: Rolling walker Gait Gait: Yes Gait Pattern: Impaired Gait Pattern: Poor foot clearance - left Gait velocity: significantly decreased Stairs / Additional Locomotion Stairs: No Wheelchair Mobility Wheelchair Mobility: Yes Wheelchair Assistance: 4: Advertising account executive Details: Tactile cues for sequencing;Tactile cues for placement;Verbal cues for technique Wheelchair Propulsion: Both upper extremities Wheelchair Parts Management: Supervision/cueing Distance: 150'  Trunk/Postural Assessment  Cervical Assessment Cervical Assessment: Within Functional Limits Thoracic  Assessment Thoracic Assessment: Exceptions to WFL(increased kyphosis) Lumbar Assessment Lumbar Assessment: Within Functional Limits Postural Control Postural Control: Within Functional Limits  Balance Balance Balance Assessed: Yes Static Sitting Balance Static Sitting - Balance Support: No upper extremity supported Static Sitting - Level of Assistance: 6: Modified independent (Device/Increase time) Dynamic Sitting Balance Dynamic Sitting - Balance Support: No upper extremity supported;Feet supported;During functional activity Dynamic Sitting - Level of Assistance: 5: Stand by assistance Dynamic Sitting - Balance Activities: Lateral lean/weight shifting;Forward lean/weight shifting;Reaching for objects;Reaching across midline Static Standing Balance Static Standing - Balance Support: Bilateral upper extremity supported Static Standing -  Level of Assistance: 5: Stand by assistance Dynamic Standing Balance Dynamic Standing - Balance Support: Bilateral upper extremity supported;During functional activity Dynamic Standing - Level of Assistance: 4: Min assist Extremity Assessment  RUE Assessment RUE Assessment: Within Functional Limits LUE Assessment LUE Assessment: Within Functional Limits RLE Assessment RLE Assessment: Exceptions to WFL(knee extension limited approx 10 degrees, grossly 4/5 MMT throughout hip/knee, not formally assessed d/t pain in residual limb) LLE Assessment LLE Assessment: Within Functional Limits   See Function Navigator for Current Functional Status.   Refer to Care Plan for Long Term Goals  Recommendations for other services: Neuropsych and Therapeutic Recreation  Pet therapy, Kitchen group and Outing/community reintegration  Discharge Criteria: Patient will be discharged from PT if patient refuses treatment 3 consecutive times without medical reason, if treatment goals not met, if there is a change in medical status, if patient makes no progress towards goals or if patient is discharged from hospital.  The above assessment, treatment plan, treatment alternatives and goals were discussed and mutually agreed upon: by patient  Luberta Mutter 10/21/2017, 12:21 PM

## 2017-10-21 NOTE — Progress Notes (Signed)
Occupational Therapy Session Note  Patient Details  Name: Brian Herring MRN: [id number] Date of Birth: Sep 16, 1955  Today's Date: 10/21/2017 OT Individual Time: 1300-1359 OT Individual Time Calculation (min): 59 min    Short Term Goals: Week 1:  OT Short Term Goal 1 (Week 1): STGs equal to LTGs based on ELOS  Skilled Therapeutic Interventions/Progress Updates:  Pt rolled himself down to the ADL apartment with supervision.  Once in the apartment discussed home setup of shower.  Pt has walk-in shower with sliding shower doors, but is unsure of the wheelchair will fit into the bathroom well.  He reports that the other guest bathroom has more room for a wheelchair.  With this in mind he would also have to have the shower doors taken down, no matter which shower he used, but based on his information, the tub shower may be his best option using a tub bench.  Pt practiced squat pivot transfer to the tub bench and out with min assist.  He also use the elevated toilet while in the bathroom as well with the RW.  Min assist for stand pivot transfer to the elevated toilet with rails.  Educated pt on the importance of standing up straight and keeping his arms extended to help with supporting his weight for the transfers.  Returned to room at end of session with pt left up in the wheelchair.  Chair alarm and safety belt in place.     Therapy Documentation Precautions:  Precautions Precautions: Fall Precaution Comments: questionable bed bugs Restrictions Weight Bearing Restrictions: Yes RLE Weight Bearing: Non weight bearing   Pain: Pain Assessment Pain Scale: 0-10 Pain Score: 7  Pain Type: Acute pain Pain Location: Leg Pain Orientation: Right Pain Descriptors / Indicators: Aching Pain Frequency: Intermittent Pain Onset: On-going Patients Stated Pain Goal: 2 Pain Intervention(s): Medication (See eMAR) Multiple Pain Sites: No ADL: See Function Navigator for Current Functional  Status.   Therapy/Group: Individual Therapy  Benedicta Sultan OTR/L 10/21/2017, 4:29 PM

## 2017-10-21 NOTE — Progress Notes (Signed)
Initial Nutrition Assessment  DOCUMENTATION CODES:   Obesity unspecified  INTERVENTION:    30 ml Prostat BID, each supplement provides 100 kcals and 15 grams protein.   Juven Fruit Punch BID, each serving provides 80kcal and 14g of protein (amino acids glutamine and arginine)  NUTRITION DIAGNOSIS:   Increased nutrient needs related to wound healing as evidenced by estimated needs.  GOAL:   Patient will meet greater than or equal to 90% of their needs  MONITOR:   PO intake, Supplement acceptance, Weight trends, Labs  REASON FOR ASSESSMENT:   Malnutrition Screening Tool    ASSESSMENT:   Patient with PMH significant for hypertension, hyperlipidemia, diabetes mellitus, sCHF with EF 25%, who presents with right foot ulcer with infection. Underwent right transtibial amputation and placement of Prevena wound VAC.    Spoke with pt at bedside. Reports PTA he had two days of poor intake due to nausea from antibiotics. Prior to these two days pt would consume two meals that consisted of eggs, bacon, and cheeseburgers. Appetite is back to baseline with meal completions ranging from 80-100%. Discussed the importance of protein intake for wound healing. Pt amendable to continuation of Prostat. RD to add Juven as well.   Pt denies any recent wt loss. Records are limited in weight history. Nutrition-Focused physical exam completed.   Medications reviewed and include: SSI, Lantus Labs reviewed.   NUTRITION - FOCUSED PHYSICAL EXAM:    Most Recent Value  Orbital Region  No depletion  Upper Arm Region  No depletion  Thoracic and Lumbar Region  No depletion  Buccal Region  No depletion  Temple Region  No depletion  Clavicle Bone Region  No depletion  Clavicle and Acromion Bone Region  No depletion  Scapular Bone Region  No depletion  Dorsal Hand  No depletion  Patellar Region  No depletion  Anterior Thigh Region  No depletion  Posterior Calf Region  No depletion  Edema (RD  Assessment)  None     Diet Order:  Diet Carb Modified Fluid consistency: Thin; Room service appropriate? Yes  EDUCATION NEEDS:   Education needs have been addressed  Skin:  Skin Assessment: Skin Integrity Issues: Skin Integrity Issues:: Incisions Incisions: right leg with wound vac s/p amputation  Last BM:  10/20/17  Height:   Ht Readings from Last 1 Encounters:  10/20/17 6' (1.829 m)    Weight:   Wt Readings from Last 1 Encounters:  10/21/17 246 lb 3.2 oz (111.7 kg)    Ideal Body Weight:  75.3 kg(adjusted right BKA)  BMI:  Body mass index is 33.39 kg/m.  Estimated Nutritional Needs:   Kcal:  2100-2300 kcal  Protein:  105-115 g  Fluid:  >2.1 L/day    Mariana Single RD, LDN Clinical Nutrition Pager # - [phone]

## 2017-10-21 NOTE — Progress Notes (Signed)
Subjective/Complaints: Urinary retention last noc with cath of 1480ml Hx of prostate issues in the past per pt  ROS  No abd pain. + phantom pain and sensation RLE  Objective: Vital Signs: Blood pressure 121/68, pulse 86, temperature 98 F (36.7 C), temperature source Oral, resp. rate 18, height 6' (1.829 m), weight 111.7 kg (246 lb 3.2 oz), SpO2 98 %. No results found. Results for orders placed or performed during the hospital encounter of 10/20/17 (from the past 72 hour(s))  Glucose, capillary     Status: Abnormal   Collection Time: 10/20/17  5:09 PM  Result Value Ref Range   Glucose-Capillary 176 (H) 65 - 99 mg/dL  Glucose, capillary     Status: Abnormal   Collection Time: 10/20/17  9:19 PM  Result Value Ref Range   Glucose-Capillary 191 (H) 65 - 99 mg/dL  Glucose, capillary     Status: Abnormal   Collection Time: 10/21/17  6:34 AM  Result Value Ref Range   Glucose-Capillary 150 (H) 65 - 99 mg/dL     HEENT: normal Cardio: RRR and no murmur Resp: CTA B/L and unlabored GI: BS positive and NT, ND Extremity:  Pulses positive and No Edema Skin:   Wound wound vac draining serosang Neuro: Alert/Oriented, Abnormal Sensory reduce LEft foot and Abnormal Motor 4/5 R HF, 5/5 Left KE, HF Musc/Skel:  Other Right BKA Gen NAD   Assessment/Plan: 1. Functional deficits secondary to Right BKA which require 3+ hours per day of interdisciplinary therapy in a comprehensive inpatient rehab setting. Physiatrist is providing close team supervision and 24 hour management of active medical problems listed below. Physiatrist and rehab team continue to assess barriers to discharge/monitor patient progress toward functional and medical goals. FIM:       Function - Toileting Toileting steps completed by patient: Performs perineal hygiene(no underwear) Toileting Assistive Devices: Grab bar or rail Assist level: Touching or steadying assistance (Pt.75%)  Function - Air cabin crew  transfer activity did not occur: Safety/medical concerns        Function - Comprehension Comprehension: Auditory Comprehension assist level: Follows basic conversation/direction with no assist  Function - Expression Expression: Verbal Expression assist level: Expresses basic needs/ideas: With no assist  Function - Social Interaction Social Interaction assist level: Interacts appropriately with others with medication or extra time (anti-anxiety, antidepressant).  Function - Problem Solving Problem solving assist level: Solves basic 90% of the time/requires cueing < 10% of the time  Function - Memory Memory assist level: Recognizes or recalls 90% of the time/requires cueing < 10% of the time Patient normally able to recall (first 3 days only): Current season, That he or she is in a hospital  Medical Problem List and Plan:  1. Functional and mobility deficits secondary to right BKA  CIR evals PT, OT 2. DVT Prophylaxis/Anticoagulation: Pharmaceutical: Lovenox  3. Pain Management: Oxycodone prn effective  4. Mood: LCSW to follow for evaluation and support.  5. Neuropsych: This patient is capable of making decisions on his own behalf.  6. Skin/Wound Care: Routine pressure relief measures.  -vac x 7 days to right BK incision pressure -125 -stump sock/shrinker  7. Fluids/Electrolytes/Nutrition: Monitor I/O. Check lytes in am.  8. T2DM: Monitor BS ac/hs continue Lantus at bedtime with meal coverage.  9. Mild hyponatremia: Will recheck labs in am.  10 ABLA: Recheck in am.  11. CAD with chronic systolic CHF: Monitor daily weights and for other signs of overload.  12.  Urinary retention , cath q 6 h  if no void   LOS (Days) 1 A FACE TO FACE EVALUATION WAS PERFORMED  Charlett Blake 10/21/2017, 7:16 AM

## 2017-10-21 NOTE — Evaluation (Signed)
Occupational Therapy Assessment and Plan  Patient Details  Name: Brian Herring MRN: [id number] Date of Birth: 1956-06-07  OT Diagnosis: acute pain and muscle weakness (generalized) Rehab Potential: Rehab Potential (ACUTE ONLY): Good ELOS: 8-10 days   Today's Date: 10/21/2017 OT Individual Time: 0800-0900 OT Individual Time Calculation (min): 60 min     Problem List:  Patient Active Problem List   Diagnosis Date Noted  . Status post below knee amputation, right ([place]) 10/20/2017  . Unilateral complete BKA ([place]) 10/20/2017  . Sepsis ([place]) 10/12/2017  . Diabetic foot ulcer ([place]) 10/12/2017  . Diabetic foot infection ([place]) 10/12/2017  . Hyponatremia 10/12/2017  . Cellulitis of right foot 10/12/2017  . Chronic systolic CHF (congestive heart failure) ([place])   . Hyperkalemia 10/13/2015  . Congestive dilated cardiomyopathy ([place]) 07/13/2015  . Asymptomatic LV dysfunction 06/25/2015  . Poorly controlled diabetes mellitus ([place]) 05/31/2015  . HTN (hypertension), benign 07/04/2014  . Viral cardiomyopathy, EF improved to 45-50% 04/02/2013  . Obesity 04/02/2013  . Diabetic foot ulcer with osteomyelitis ([place]) 04/02/2013  . Hyperlipidemia 04/02/2013  . Sinus tachycardia 04/02/2013    Past Medical History:  Past Medical History:  Diagnosis Date  . Chronic systolic CHF (congestive heart failure) ([place])   . Diabetes mellitus without complication ([place])   . Hyperlipidemia   . Hypertension    Past Surgical History:  Past Surgical History:  Procedure Laterality Date  . AMPUTATION Right 10/14/2017   Procedure: AMPUTATION  GREAT TOE;  Surgeon: [name], MD;  Location: [facility];  Service: Orthopedics;  Laterality: Right;  . AMPUTATION Right 10/18/2017   Procedure: RIGHT BELOW KNEE AMPUTATION;  Surgeon: [name], MD;  Location: [facility];  Service: Orthopedics;  Laterality: Right;  . SHOULDER SURGERY  01/05    Assessment & Plan Clinical Impression: Patient is a 62 y.o. year old male with  recent admission to the hospital on 10/12/17 with infected foot ulcer with osteomyelitis. He was started on IV antibiotics and underwent right transtibial amputation. He has had issues with bloody stools felt to be due to ASA and Sq heparin. H/H stable. Mild hyponateremia noted and reactive leucocytosis resolving.   .  Patient transferred to CIR on 10/20/2017 .    Patient currently requires min with basic self-care skills secondary to muscle weakness and decreased standing balance and decreased balance strategies.  Prior to hospitalization, patient could complete ADLs with independent .  Patient will benefit from skilled intervention to decrease level of assist with basic self-care skills prior to discharge home independently.  Anticipate patient will require no assist and follow up home health.  OT - End of Session Endurance Deficit: Yes Endurance Deficit Description: fatigues quickly with mobility activities OT Assessment Rehab Potential (ACUTE ONLY): Good OT Barriers to Discharge: Decreased caregiver support OT Patient demonstrates impairments in the following area(s): Balance;Endurance;Pain OT Basic ADL's Functional Problem(s): Grooming;Bathing;Dressing;Toileting OT Advanced ADL's Functional Problem(s): Simple Meal Preparation;Light Housekeeping OT Transfers Functional Problem(s): Toilet;Tub/Shower OT Additional Impairment(s): None OT Plan OT Intensity: Minimum of 1-2 x/day, 45 to 90 minutes OT Frequency: 5 out of 7 days OT Duration/Estimated Length of Stay: 8-10 days OT Treatment/Interventions: Balance/vestibular training;Discharge planning;Pain management;Self Care/advanced ADL retraining;Therapeutic Activities;Functional mobility training;Patient/family education;Therapeutic Exercise;Community reintegration;DME/adaptive equipment instruction;UE/LE Strength taining/ROM;Wheelchair propulsion/positioning OT Self Feeding Anticipated Outcome(s): independent OT Basic Self-Care Anticipated  Outcome(s): modified independent OT Toileting Anticipated Outcome(s): modified independent OT Bathroom Transfers Anticipated Outcome(s): modified independent OT Recommendation Patient destination: Home Follow Up Recommendations: Home health OT Equipment Recommended: Tub/shower bench;3  in 1 bedside comode   Skilled Therapeutic Intervention Pt completed selfcare retraining sit to stand at the EOB this session with overall min assist.  He was able to complete lateral leans side to side for washing peri area but then needed min assist for sit to stand and standing balance when attempting to pull pants over hips.  He completed stand pivot transfer with min assist to the wheelchair as well.  Pt left in the chair at end of session with chair alarm in place and call button in reach.  Pt also with quick release belt in place as well.    OT Evaluation Precautions/Restrictions  Precautions Precautions: Fall Precaution Comments: questionable bed bugs Restrictions Weight Bearing Restrictions: Yes RLE Weight Bearing: Non weight bearing   Pain Pain Assessment Pain Scale: 0-10 Pain Score: 7  Pain Type: Acute pain Pain Location: Leg Pain Orientation: Right Pain Descriptors / Indicators: Aching Pain Frequency: Intermittent Pain Onset: On-going Patients Stated Pain Goal: 2 Pain Intervention(s): Medication (See eMAR) Home Living/Prior [place] expects to be discharged to:: Private residence Living Arrangements: Alone Available Help at Discharge: Friend(s), Available PRN/intermittently Type of Home: House Home Access: Stairs to enter Technical brewer of Steps: 2 Entrance Stairs-Rails: Right, Left, Can reach both Home Layout: One level Bathroom Shower/Tub: Gaffer, Chiropodist: Standard Bathroom Accessibility: Yes Additional Comments: Guest bathroom is accessible with wheelchair per his report.  Lives With: Alone IADL  History Homemaking Responsibilities: Yes Meal Prep Responsibility: Primary Laundry Responsibility: Primary Cleaning Responsibility: Primary Bill Paying/Finance Responsibility: Primary Shopping Responsibility: Primary Current License: Yes Mode of Transportation: Car Occupation: On disability Prior Function Level of Independence: Independent with basic ADLs  Able to Take Stairs?: Yes Driving: Yes Comments: works odd jobs ADL  See Function Section of chart  Vision Baseline Vision/History: Wears glasses Wears Glasses: Reading only;Distance only(Has one pair for each) Patient Visual Report: No change from baseline Vision Assessment?: No apparent visual deficits Perception  Perception: Within Functional Limits Praxis Praxis: Intact Cognition Overall Cognitive Status: Within Functional Limits for tasks assessed Arousal/Alertness: Lethargic Orientation Level: Place Year: 2019 Month: March Day of Week: Correct Memory: Appears intact Immediate Memory Recall: Sock;Blue;Bed Memory Recall: Sock;Blue;Bed Memory Recall Sock: Without Cue Memory Recall Blue: Without Cue Memory Recall Bed: Without Cue Attention: Sustained;Focused Focused Attention: Appears intact Sustained Attention: Appears intact Awareness: Appears intact Problem Solving: Appears intact Safety/Judgment: Appears intact Sensation Sensation Light Touch: Impaired Detail Light Touch Impaired Details: Impaired RUE;Impaired LUE Stereognosis: Not tested Hot/Cold: Not tested Proprioception: Not tested Additional Comments: Pt reports numbness in bilateral fingers. Coordination Gross Motor Movements are Fluid and Coordinated: Yes Fine Motor Movements are Fluid and Coordinated: Yes Heel Shin Test: NT d/t amputation Motor  Motor Motor: Within Functional Limits Motor - Skilled Clinical Observations: mild generalized weakness, limited by R residual limb pain Mobility  Bed Mobility Bed Mobility: Sit to Supine;Supine  to Sit;Rolling Right;Rolling Left Rolling Right: 5: Supervision Rolling Right Details: Verbal cues for technique Rolling Left: 5: Supervision Rolling Left Details: Verbal cues for technique Supine to Sit: 5: Supervision Supine to Sit Details: Verbal cues for technique Sit to Supine: 5: Supervision Sit to Supine - Details: Verbal cues for technique Transfers Sit to Stand: 4: Min guard;With armrests;4: Min assist Sit to Stand Details: Verbal cues for technique;Verbal cues for precautions/safety;Verbal cues for safe use of DME/AE;Tactile cues for weight shifting  Trunk/Postural Assessment  Cervical Assessment Cervical Assessment: Within Functional Limits Thoracic Assessment Thoracic Assessment: Exceptions to WFL(thoracic  kyphosis) Lumbar Assessment Lumbar Assessment: Exceptions to WFL(posterior pelvic tilt at rest) Postural Control Postural Control: Within Functional Limits  Balance Balance Balance Assessed: Yes Static Sitting Balance Static Sitting - Balance Support: No upper extremity supported Static Sitting - Level of Assistance: 6: Modified independent (Device/Increase time) Dynamic Sitting Balance Dynamic Sitting - Balance Support: During functional activity;Feet supported Dynamic Sitting - Level of Assistance: 5: Stand by assistance Dynamic Sitting - Balance Activities: Lateral lean/weight shifting;Forward lean/weight shifting;Reaching for objects;Reaching across midline Static Standing Balance Static Standing - Balance Support: Bilateral upper extremity supported Static Standing - Level of Assistance: 5: Stand by assistance Dynamic Standing Balance Dynamic Standing - Balance Support: Bilateral upper extremity supported;During functional activity Dynamic Standing - Level of Assistance: 4: Min assist Extremity/Trunk Assessment RUE Assessment RUE Assessment: Within Functional Limits LUE Assessment LUE Assessment: Exceptions to WFL(Pt reports history of rotator cuff tear.   Shoulder AROM WFLS but strength 3+/5.  All other joints WFLs.  )   See Function Navigator for Current Functional Status.   Refer to Care Plan for Long Term Goals  Recommendations for other services: Therapeutic Recreation  Outing/community reintegration   Discharge Criteria: Patient will be discharged from OT if patient refuses treatment 3 consecutive times without medical reason, if treatment goals not met, if there is a change in medical status, if patient makes no progress towards goals or if patient is discharged from hospital.  The above assessment, treatment plan, treatment alternatives and goals were discussed and mutually agreed upon: by patient  Mylo Choi OTR/L 10/21/2017, 4:14 PM

## 2017-10-22 LAB — URINALYSIS, ROUTINE W REFLEX MICROSCOPIC
Bilirubin Urine: NEGATIVE
Glucose, UA: >=500 mg/dL — AB
KETONES UR: NEGATIVE mg/dL
LEUKOCYTES UA: NEGATIVE
NITRITE: NEGATIVE
PH: 9 — AB (ref 5.0–8.0)
Specific Gravity, Urine: 1.014 (ref 1.005–1.030)

## 2017-10-22 LAB — URINALYSIS, MICROSCOPIC (REFLEX)
BACTERIA UA: NONE SEEN
Squamous Epithelial / LPF: NONE SEEN

## 2017-10-22 LAB — GLUCOSE, CAPILLARY
GLUCOSE-CAPILLARY: 242 mg/dL — AB (ref 65–99)
Glucose-Capillary: 157 mg/dL — ABNORMAL HIGH (ref 65–99)
Glucose-Capillary: 232 mg/dL — ABNORMAL HIGH (ref 65–99)
Glucose-Capillary: 253 mg/dL — ABNORMAL HIGH (ref 65–99)

## 2017-10-22 MED ORDER — INSULIN GLARGINE 100 UNIT/ML ~~LOC~~ SOLN
20.0000 [IU] | Freq: Every day | SUBCUTANEOUS | Status: DC
  Administered 2017-10-22 – 2017-10-24 (×3): 20 [IU] via SUBCUTANEOUS
  Filled 2017-10-22 (×3): qty 0.2

## 2017-10-22 MED ORDER — ENOXAPARIN SODIUM 40 MG/0.4ML ~~LOC~~ SOLN
40.0000 mg | SUBCUTANEOUS | Status: DC
  Administered 2017-10-23 – 2017-10-30 (×8): 40 mg via SUBCUTANEOUS
  Filled 2017-10-22 (×8): qty 0.4

## 2017-10-22 NOTE — Progress Notes (Addendum)
Subjective/Complaints:  Difficulty with I/O cath.  Pt with prior hx of prostate issues, DM, now post op.  Wine colored urine in foley bag  ROS  No abd pain. + phantom pain and sensation RLE  Objective: Vital Signs: Blood pressure 115/61, pulse 81, temperature 98.4 F (36.9 C), temperature source Oral, resp. rate 18, height 6' (1.829 m), weight 102.7 kg (226 lb 6.6 oz), SpO2 97 %. No results found. Results for orders placed or performed during the hospital encounter of 10/20/17 (from the past 72 hour(s))  Glucose, capillary     Status: Abnormal   Collection Time: 10/20/17  5:09 PM  Result Value Ref Range   Glucose-Capillary 176 (H) 65 - 99 mg/dL  Glucose, capillary     Status: Abnormal   Collection Time: 10/20/17  9:19 PM  Result Value Ref Range   Glucose-Capillary 191 (H) 65 - 99 mg/dL  CBC WITH DIFFERENTIAL     Status: Abnormal   Collection Time: 10/21/17  6:33 AM  Result Value Ref Range   WBC 11.4 (H) 4.0 - 10.5 K/uL   RBC 3.39 (L) 4.22 - 5.81 MIL/uL   Hemoglobin 10.0 (L) 13.0 - 17.0 g/dL   HCT 31.7 (L) 39.0 - 52.0 %   MCV 93.5 78.0 - 100.0 fL   MCH 29.5 26.0 - 34.0 pg   MCHC 31.5 30.0 - 36.0 g/dL   RDW 15.1 11.5 - 15.5 %   Platelets 480 (H) 150 - 400 K/uL   Neutrophils Relative % 67 %   Neutro Abs 7.6 1.7 - 7.7 K/uL   Lymphocytes Relative 27 %   Lymphs Abs 3.1 0.7 - 4.0 K/uL   Monocytes Relative 5 %   Monocytes Absolute 0.5 0.1 - 1.0 K/uL   Eosinophils Relative 1 %   Eosinophils Absolute 0.1 0.0 - 0.7 K/uL   Basophils Relative 0 %   Basophils Absolute 0.0 0.0 - 0.1 K/uL    Comment: Performed at [facility], [address], [place], [place] [address]  Comprehensive metabolic panel     Status: Abnormal   Collection Time: 10/21/17  6:33 AM  Result Value Ref Range   Sodium 137 135 - 145 mmol/L   Potassium 4.6 3.5 - 5.1 mmol/L   Chloride 103 101 - 111 mmol/L   CO2 27 22 - 32 mmol/L   Glucose, Bld 146 (H) 65 - 99 mg/dL   BUN 20 6 - 20 mg/dL   Creatinine, Ser  0.79 0.61 - 1.24 mg/dL   Calcium 8.7 (L) 8.9 - 10.3 mg/dL   Total Protein 6.0 (L) 6.5 - 8.1 g/dL   Albumin 2.3 (L) 3.5 - 5.0 g/dL   AST 33 15 - 41 U/L   ALT 34 17 - 63 U/L   Alkaline Phosphatase 89 38 - 126 U/L   Total Bilirubin 0.5 0.3 - 1.2 mg/dL   GFR calc non Af Amer >60 >60 mL/min   GFR calc Af Amer >60 >60 mL/min    Comment: (NOTE) The eGFR has been calculated using the CKD EPI equation. This calculation has not been validated in all clinical situations. eGFR's persistently <60 mL/min signify possible Chronic Kidney Disease.    Anion gap 7 5 - 15    Comment: Performed at [facility] [address], [place], [place] [address]  Glucose, capillary     Status: Abnormal   Collection Time: 10/21/17  6:34 AM  Result Value Ref Range   Glucose-Capillary 150 (H) 65 - 99  mg/dL  Glucose, capillary     Status: Abnormal   Collection Time: 10/21/17 12:00 PM  Result Value Ref Range   Glucose-Capillary 203 (H) 65 - 99 mg/dL  Glucose, capillary     Status: Abnormal   Collection Time: 10/21/17  4:45 PM  Result Value Ref Range   Glucose-Capillary 179 (H) 65 - 99 mg/dL  Glucose, capillary     Status: Abnormal   Collection Time: 10/21/17  9:11 PM  Result Value Ref Range   Glucose-Capillary 231 (H) 65 - 99 mg/dL   Comment 1 Notify RN   Glucose, capillary     Status: Abnormal   Collection Time: 10/22/17  6:31 AM  Result Value Ref Range   Glucose-Capillary 157 (H) 65 - 99 mg/dL     HEENT: normal Cardio: RRR and no murmur Resp: CTA B/L and unlabored GI: BS positive and NT, ND Extremity:  Pulses positive and No Edema Skin:   Wound wound vac draining serosang Neuro: Alert/Oriented, Abnormal Sensory reduce LEft foot and Abnormal Motor 4/5 R HF, 5/5 Left KE, HF Musc/Skel:  Other Right BKA Gen NAD   Assessment/Plan: 1. Functional deficits secondary to Right BKA which require 3+ hours per day of interdisciplinary therapy in a comprehensive inpatient rehab setting. Physiatrist is  providing close team supervision and 24 hour management of active medical problems listed below. Physiatrist and rehab team continue to assess barriers to discharge/monitor patient progress toward functional and medical goals. FIM: Function - Bathing Position: Sitting EOB Body parts bathed by patient: Right arm, Left arm, Chest, Abdomen, Front perineal area, Buttocks, Right upper leg, Left upper leg, Left lower leg Body parts bathed by helper: Back  Function- Upper Body Dressing/Undressing What is the patient wearing?: Pull over shirt/dress Pull over shirt/dress - Perfomed by patient: Thread/unthread right sleeve, Thread/unthread left sleeve, Put head through opening, Pull shirt over trunk Assist Level: Set up Function - Lower Body Dressing/Undressing What is the patient wearing?: Non-skid slipper socks, Socks, Pants Position: Sitting EOB Pants- Performed by patient: Thread/unthread right pants leg, Thread/unthread left pants leg Pants- Performed by helper: Pull pants up/down Non-skid slipper socks- Performed by patient: Don/doff left sock(right sock NA secondary to amputation)  Function - Toileting Toileting steps completed by patient: Performs perineal hygiene(no underwear) Toileting Assistive Devices: Grab bar or rail Assist level: Touching or steadying assistance (Pt.75%)  Function - Air cabin crew transfer activity did not occur: Safety/medical concerns Toilet transfer assistive device: Grab bar, Bedside commode Assist level to toilet: Touching or steadying assistance (Pt > 75%) Assist level from toilet: Touching or steadying assistance (Pt > 75%)  Function - Chair/bed transfer Chair/bed transfer method: Stand pivot Chair/bed transfer assist level: Touching or steadying assistance (Pt > 75%) Chair/bed transfer assistive device: Walker Chair/bed transfer details: Verbal cues for technique, Verbal cues for precautions/safety, Verbal cues for safe use of  DME/AE  Function - Locomotion: Wheelchair Will patient use wheelchair at discharge?: Yes Type: Manual Max wheelchair distance: 150 Assist Level: Touching or steadying assistance (Pt > 75%) Assist Level: Touching or steadying assistance (Pt > 75%) Assist Level: Touching or steadying assistance (Pt > 75%) Turns around,maneuvers to table,bed, and toilet,negotiates 3% grade,maneuvers on rugs and over doorsills: No Function - Locomotion: Ambulation Assistive device: Walker-rolling Max distance: 8 Assist level: Touching or steadying assistance (Pt > 75%) Walk 10 feet activity did not occur: Safety/medical concerns(fatigue) Walk 50 feet with 2 turns activity did not occur: Safety/medical concerns Walk 150 feet activity did not occur: Safety/medical concerns  Walk 10 feet on uneven surfaces activity did not occur: Safety/medical concerns  Function - Comprehension Comprehension: Auditory Comprehension assist level: Understands complex 90% of the time/cues 10% of the time  Function - Expression Expression: Verbal Expression assist level: Expresses complex ideas: With extra time/assistive device  Function - Social Interaction Social Interaction assist level: Interacts appropriately with others - No medications needed.  Function - Problem Solving Problem solving assist level: Solves basic problems with no assist  Function - Memory Memory assist level: Recognizes or recalls 90% of the time/requires cueing < 10% of the time Patient normally able to recall (first 3 days only): Current season, Location of own room, Staff names and faces, That he or she is in a hospital  Medical Problem List and Plan:  1. Functional and mobility deficits secondary to right BKA  POD #4  CIR PT, OT 2. DVT Prophylaxis/Anticoagulation: Pharmaceutical: Lovenox  3. Pain Management: Oxycodone prn effective  4. Mood: LCSW to follow for evaluation and support.  5. Neuropsych: This patient is capable of making  decisions on his own behalf.  6. Skin/Wound Care: Routine pressure relief measures.  -vac x 7 days to right BK incision pressure -125 -stump sock/shrinker  7. Fluids/Electrolytes/Nutrition: Monitor I/O. Check lytes in am.  8. T2DM: Monitor BS ac/hs continue Lantus at bedtime with meal coverage CBG (last 3)  Recent Labs    10/21/17 1645 10/21/17 2111 10/22/17 0631  GLUCAP 179* 231* 157*   Increase Lantus to 20U 9. Mild hyponatremia: Will recheck labs in am.  10 ABLA: post op, now with hematuria related to traumatic I/O cath recheck Hgb 4/2 11. CAD with chronic systolic CHF: Monitor daily weights and for other signs of overload.  12.  Urinary retention , difficulty with cathing has foley , Flomax started , voiding trial in 2-3 d   LOS (Days) 2 A FACE TO FACE EVALUATION WAS PERFORMED  Charlett Blake 10/22/2017, 7:16 AM

## 2017-10-22 NOTE — Plan of Care (Signed)
  Problem: RH BLADDER ELIMINATION Goal: RH STG MANAGE BLADDER WITH ASSISTANCE Description STG Manage Bladder With min. Assistance  10/22/2017 0607 by Gwendolyn Grant, RN Outcome: Not Progressing Note:  Pt with difficult in and out cath; indwelling urinary catheter inserted per MD order. 10/22/2017 0600 by Gwendolyn Grant, RN

## 2017-10-23 ENCOUNTER — Inpatient Hospital Stay (HOSPITAL_COMMUNITY): Payer: BLUE CROSS/BLUE SHIELD

## 2017-10-23 ENCOUNTER — Inpatient Hospital Stay (HOSPITAL_COMMUNITY): Payer: BLUE CROSS/BLUE SHIELD | Admitting: Occupational Therapy

## 2017-10-23 DIAGNOSIS — D72829 Elevated white blood cell count, unspecified: Secondary | ICD-10-CM

## 2017-10-23 DIAGNOSIS — R339 Retention of urine, unspecified: Secondary | ICD-10-CM

## 2017-10-23 DIAGNOSIS — D62 Acute posthemorrhagic anemia: Secondary | ICD-10-CM

## 2017-10-23 DIAGNOSIS — E46 Unspecified protein-calorie malnutrition: Secondary | ICD-10-CM

## 2017-10-23 LAB — GLUCOSE, CAPILLARY
GLUCOSE-CAPILLARY: 170 mg/dL — AB (ref 65–99)
GLUCOSE-CAPILLARY: 247 mg/dL — AB (ref 65–99)
Glucose-Capillary: 255 mg/dL — ABNORMAL HIGH (ref 65–99)
Glucose-Capillary: 221 mg/dL — ABNORMAL HIGH (ref 65–99)

## 2017-10-23 LAB — CBC
HEMATOCRIT: 31.9 % — AB (ref 39.0–52.0)
HEMOGLOBIN: 10.2 g/dL — AB (ref 13.0–17.0)
MCH: 29.5 pg (ref 26.0–34.0)
MCHC: 32 g/dL (ref 30.0–36.0)
MCV: 92.2 fL (ref 78.0–100.0)
Platelets: 482 10*3/uL — ABNORMAL HIGH (ref 150–400)
RBC: 3.46 MIL/uL — AB (ref 4.22–5.81)
RDW: 15.1 % (ref 11.5–15.5)
WBC: 13 10*3/uL — ABNORMAL HIGH (ref 4.0–10.5)

## 2017-10-23 MED ORDER — LIDOCAINE HCL 2 % EX GEL
CUTANEOUS | Status: DC | PRN
  Filled 2017-10-23: qty 5

## 2017-10-23 NOTE — Progress Notes (Signed)
Occupational Therapy Session Note  Patient Details  Name: Brian Herring MRN: [id number] Date of Birth: 01-12-1956  Today's Date: 10/23/2017 OT Individual Time: 4975-3005 OT Individual Time Calculation (min): 60 min    Skilled Therapeutic Interventions/Progress Updates: patient participated in (at sink in his room) sit to stand (close S) and dynamic standing balance (close S and cues to stand midline away from his comfortable zone of leaning slight left and weight bearing mostly trough left trunk, foot and arm);   He stood for approximately 10 minutes to slowly gently stretch into postural alignment (shoulder back, buttom tucked in, midlign, chest up, etc)  He stated the stretch so helpful to his back that he asked to sit and stretch more in his chair.   So, once seated, he was able to stretch cervical neck, shoulders, back and trunk (including rotation).  He also worked on upper arms strengthening and enduraance activiites seated in his wheelchair, within his left shoulder rotator cuff challenge tolerance.  He asked about shamppooing his hair and used a shampoo cap with setup and combed his hair afterwards.  He was left seated in his w/c at session's end with call bell and phonewithin reach     Therapy Documentation Precautions:  Precautions Precautions: Fall Precaution Comments: questionable bed bugs Restrictions Weight Bearing Restrictions: Yes RLE Weight Bearing: Non weight bearing Pain:denied   Therapy/Group: Individual Therapy  Alfredia Ferguson [facility] 10/23/2017, 2:55 PM

## 2017-10-23 NOTE — Progress Notes (Signed)
Subjective/Complaints: Pt seen lying in bed this AM.  He slept well ovenight.  He had a good weekend of therapies.    ROS  Denies CP, SOB, N/V/D.  Objective: Vital Signs: Blood pressure 102/64, pulse 91, temperature 98.9 F (37.2 C), temperature source Oral, resp. rate 18, height 6' (1.829 m), weight 99.8 kg (220 lb 0.3 oz), SpO2 95 %. No results found. Results for orders placed or performed during the hospital encounter of 10/20/17 (from the past 72 hour(s))  Glucose, capillary     Status: Abnormal   Collection Time: 10/20/17  5:09 PM  Result Value Ref Range   Glucose-Capillary 176 (H) 65 - 99 mg/dL  Glucose, capillary     Status: Abnormal   Collection Time: 10/20/17  9:19 PM  Result Value Ref Range   Glucose-Capillary 191 (H) 65 - 99 mg/dL  CBC WITH DIFFERENTIAL     Status: Abnormal   Collection Time: 10/21/17  6:33 AM  Result Value Ref Range   WBC 11.4 (H) 4.0 - 10.5 K/uL   RBC 3.39 (L) 4.22 - 5.81 MIL/uL   Hemoglobin 10.0 (L) 13.0 - 17.0 g/dL   HCT 31.7 (L) 39.0 - 52.0 %   MCV 93.5 78.0 - 100.0 fL   MCH 29.5 26.0 - 34.0 pg   MCHC 31.5 30.0 - 36.0 g/dL   RDW 15.1 11.5 - 15.5 %   Platelets 480 (H) 150 - 400 K/uL   Neutrophils Relative % 67 %   Neutro Abs 7.6 1.7 - 7.7 K/uL   Lymphocytes Relative 27 %   Lymphs Abs 3.1 0.7 - 4.0 K/uL   Monocytes Relative 5 %   Monocytes Absolute 0.5 0.1 - 1.0 K/uL   Eosinophils Relative 1 %   Eosinophils Absolute 0.1 0.0 - 0.7 K/uL   Basophils Relative 0 %   Basophils Absolute 0.0 0.0 - 0.1 K/uL    Comment: Performed at [facility], [address] [place], [place] [address]  Comprehensive metabolic panel     Status: Abnormal   Collection Time: 10/21/17  6:33 AM  Result Value Ref Range   Sodium 137 135 - 145 mmol/L   Potassium 4.6 3.5 - 5.1 mmol/L   Chloride 103 101 - 111 mmol/L   CO2 27 22 - 32 mmol/L   Glucose, Bld 146 (H) 65 - 99 mg/dL   BUN 20 6 - 20 mg/dL   Creatinine, Ser 0.79 0.61 - 1.24 mg/dL   Calcium 8.7 (L) 8.9 -  10.3 mg/dL   Total Protein 6.0 (L) 6.5 - 8.1 g/dL   Albumin 2.3 (L) 3.5 - 5.0 g/dL   AST 33 15 - 41 U/L   ALT 34 17 - 63 U/L   Alkaline Phosphatase 89 38 - 126 U/L   Total Bilirubin 0.5 0.3 - 1.2 mg/dL   GFR calc non Af Amer >60 >60 mL/min   GFR calc Af Amer >60 >60 mL/min    Comment: (NOTE) The eGFR has been calculated using the CKD EPI equation. This calculation has not been validated in all clinical situations. eGFR's persistently <60 mL/min signify possible Chronic Kidney Disease.    Anion gap 7 5 - 15    Comment: Performed at [facility] [address] [place], [place] [address]  Glucose, capillary     Status: Abnormal   Collection Time: 10/21/17  6:34 AM  Result Value Ref Range   Glucose-Capillary 150 (H) 65 - 99 mg/dL  Glucose, capillary  Status: Abnormal   Collection Time: 10/21/17 12:00 PM  Result Value Ref Range   Glucose-Capillary 203 (H) 65 - 99 mg/dL  Glucose, capillary     Status: Abnormal   Collection Time: 10/21/17  4:45 PM  Result Value Ref Range   Glucose-Capillary 179 (H) 65 - 99 mg/dL  Glucose, capillary     Status: Abnormal   Collection Time: 10/21/17  9:11 PM  Result Value Ref Range   Glucose-Capillary 231 (H) 65 - 99 mg/dL   Comment 1 Notify RN   Glucose, capillary     Status: Abnormal   Collection Time: 10/22/17  6:31 AM  Result Value Ref Range   Glucose-Capillary 157 (H) 65 - 99 mg/dL  Glucose, capillary     Status: Abnormal   Collection Time: 10/22/17 11:28 AM  Result Value Ref Range   Glucose-Capillary 242 (H) 65 - 99 mg/dL  Urinalysis, Routine w reflex microscopic     Status: Abnormal   Collection Time: 10/22/17  1:59 PM  Result Value Ref Range   Color, Urine RED (A) YELLOW    Comment: BIOCHEMICALS MAY BE AFFECTED BY COLOR   APPearance CLOUDY (A) CLEAR   Specific Gravity, Urine 1.014 1.005 - 1.030   pH 9.0 (H) 5.0 - 8.0   Glucose, UA >=500 (A) NEGATIVE mg/dL   Hgb urine dipstick LARGE (A) NEGATIVE   Bilirubin Urine NEGATIVE  NEGATIVE   Ketones, ur NEGATIVE NEGATIVE mg/dL   Protein, ur >300 (A) NEGATIVE mg/dL   Nitrite NEGATIVE NEGATIVE   Leukocytes, UA NEGATIVE NEGATIVE    Comment: Performed at [facility], [place] [address], [place], [place] [address]  Urinalysis, Microscopic (reflex)     Status: None   Collection Time: 10/22/17  1:59 PM  Result Value Ref Range   RBC / HPF TOO NUMEROUS TO COUNT 0 - 5 RBC/hpf   WBC, UA TOO NUMEROUS TO COUNT 0 - 5 WBC/hpf   Bacteria, UA NONE SEEN NONE SEEN   Squamous Epithelial / LPF NONE SEEN NONE SEEN   WBC Casts, UA PRESENT     Comment: Performed at [facility], [place] [address], [place], [place] [address]  Glucose, capillary     Status: Abnormal   Collection Time: 10/22/17  4:57 PM  Result Value Ref Range   Glucose-Capillary 232 (H) 65 - 99 mg/dL  Glucose, capillary     Status: Abnormal   Collection Time: 10/22/17  9:46 PM  Result Value Ref Range   Glucose-Capillary 253 (H) 65 - 99 mg/dL  CBC     Status: Abnormal   Collection Time: 10/23/17  4:21 AM  Result Value Ref Range   WBC 13.0 (H) 4.0 - 10.5 K/uL   RBC 3.46 (L) 4.22 - 5.81 MIL/uL   Hemoglobin 10.2 (L) 13.0 - 17.0 g/dL   HCT 31.9 (L) 39.0 - 52.0 %   MCV 92.2 78.0 - 100.0 fL   MCH 29.5 26.0 - 34.0 pg   MCHC 32.0 30.0 - 36.0 g/dL   RDW 15.1 11.5 - 15.5 %   Platelets 482 (H) 150 - 400 K/uL    Comment: Performed at [facility], [place] [address], [place], [place] [address]  Glucose, capillary     Status: Abnormal   Collection Time: 10/23/17  6:14 AM  Result Value Ref Range   Glucose-Capillary 255 (H) 65 - 99 mg/dL    Gen NAD. Well-developed. HEENT: Normocephalic, atraumatic.  Cardio: RRR and no JVD Resp: CTA B/L  and unlabored GI: BS positive and ND Musc/Skel:  Right BKA TTP Neuro: Alert/Oriented Motor: 4/5 R HF LLE: 4+/5 proximal to distal B/l UE: 5/5 proximal to distal Skin:   +VAC  Assessment/Plan: 1. Functional deficits secondary to Right BKA which require 3+ hours per day of  interdisciplinary therapy in a comprehensive inpatient rehab setting. Physiatrist is providing close team supervision and 24 hour management of active medical problems listed below. Physiatrist and rehab team continue to assess barriers to discharge/monitor patient progress toward functional and medical goals. FIM: Function - Bathing Position: Sitting EOB Body parts bathed by patient: Right arm, Left arm, Chest, Abdomen, Front perineal area, Buttocks, Right upper leg, Left upper leg, Left lower leg Body parts bathed by helper: Back  Function- Upper Body Dressing/Undressing What is the patient wearing?: Pull over shirt/dress Pull over shirt/dress - Perfomed by patient: Thread/unthread right sleeve, Thread/unthread left sleeve, Put head through opening, Pull shirt over trunk Assist Level: Set up Function - Lower Body Dressing/Undressing What is the patient wearing?: Non-skid slipper socks, Socks, Pants Position: Sitting EOB Pants- Performed by patient: Thread/unthread right pants leg, Thread/unthread left pants leg Pants- Performed by helper: Pull pants up/down Non-skid slipper socks- Performed by patient: Don/doff left sock(right sock NA secondary to amputation)  Function - Toileting Toileting activity did not occur: No continent bowel/bladder event Toileting steps completed by patient: Performs perineal hygiene(no underwear) Toileting Assistive Devices: Grab bar or rail Assist level: Touching or steadying assistance (Pt.75%)  Function - Air cabin crew transfer activity did not occur: N/A Toilet transfer assistive device: Grab bar, Bedside commode Assist level to toilet: Touching or steadying assistance (Pt > 75%) Assist level from toilet: Touching or steadying assistance (Pt > 75%)  Function - Chair/bed transfer Chair/bed transfer method: Stand pivot Chair/bed transfer assist level: Touching or steadying assistance (Pt > 75%) Chair/bed transfer assistive device:  Walker Chair/bed transfer details: Verbal cues for technique, Verbal cues for precautions/safety, Verbal cues for safe use of DME/AE  Function - Locomotion: Wheelchair Will patient use wheelchair at discharge?: Yes Type: Manual Max wheelchair distance: 150 Assist Level: Touching or steadying assistance (Pt > 75%) Assist Level: Touching or steadying assistance (Pt > 75%) Assist Level: Touching or steadying assistance (Pt > 75%) Turns around,maneuvers to table,bed, and toilet,negotiates 3% grade,maneuvers on rugs and over doorsills: No Function - Locomotion: Ambulation Assistive device: Walker-rolling Max distance: 8 Assist level: Touching or steadying assistance (Pt > 75%) Walk 10 feet activity did not occur: Safety/medical concerns(fatigue) Walk 50 feet with 2 turns activity did not occur: Safety/medical concerns Walk 150 feet activity did not occur: Safety/medical concerns Walk 10 feet on uneven surfaces activity did not occur: Safety/medical concerns  Function - Comprehension Comprehension: Auditory Comprehension assist level: Understands complex 90% of the time/cues 10% of the time  Function - Expression Expression: Verbal Expression assist level: Expresses complex ideas: With extra time/assistive device  Function - Social Interaction Social Interaction assist level: Interacts appropriately with others with medication or extra time (anti-anxiety, antidepressant).  Function - Problem Solving Problem solving assist level: Solves basic problems with no assist  Function - Memory Memory assist level: Recognizes or recalls 90% of the time/requires cueing < 10% of the time Patient normally able to recall (first 3 days only): Current season, Location of own room, Staff names and faces, That he or she is in a hospital  Medical Problem List and Plan:  1. Functional and mobility deficits secondary to right BKA     Cont CIR  Notes reviewed, labs reviewed 2. DVT  Prophylaxis/Anticoagulation: Pharmaceutical: Lovenox  3. Pain Management: Oxycodone prn effective  4. Mood: LCSW to follow for evaluation and support.  5. Neuropsych: This patient is capable of making decisions on his own behalf.  6. Skin/Wound Care: Routine pressure relief measures.    Vac x 7 days to right BK incision pressure (d/c 4/3) 7. Fluids/Electrolytes/Nutrition: Monitor I/O.  8. T2DM: Monitor BS ac/hs continue Lantus at bedtime with meal coverage CBG (last 3)  Recent Labs    10/22/17 1657 10/22/17 2146 10/23/17 0614  GLUCAP 232* 253* 255*    Increased Lantus to 20U on 3/31   Remains elevated, consider further increase tomorrow 9. Mild hyponatremia:  Resolved   Na 137 on 3/30 10 ABLA:    Hb 10.2 on 4/1 11. CAD with chronic systolic CHF: Monitor daily weights and for other signs of overload.  Filed Weights   10/21/17 0510 10/22/17 0419 10/23/17 0448  Weight: 111.7 kg (246 lb 3.2 oz) 102.7 kg (226 lb 6.6 oz) 99.8 kg (220 lb 0.3 oz)  12.  Urinary retention   Difficulty with cathing has foley    Flomax started    Voiding trial tomorrow 13. Leukocytosis   WBCs 13.0 on 4/1   Afebrile  UA unremarkable for infection  Cont to monitor 14. Hypoalbuminemia   Supplement initiated  LOS (Days) 3 A FACE TO FACE EVALUATION WAS PERFORMED  Brian Herring 10/23/2017, 8:14 AM

## 2017-10-23 NOTE — Care Management Note (Signed)
[facility] Individual Statement of Services  Patient Name:  Brian Herring  Date:  10/23/2017  Welcome to the [facility].  Our goal is to provide you with an individualized program based on your diagnosis and situation, designed to meet your specific needs.  With this comprehensive rehabilitation program, you will be expected to participate in at least 3 hours of rehabilitation therapies Monday-Friday, with modified therapy programming on the weekends.  Your rehabilitation program will include the following services:  Physical Therapy (PT), Occupational Therapy (OT), 24 hour per day rehabilitation nursing, Therapeutic Recreaction (TR), Neuropsychology, Case Management (Social Worker), Rehabilitation Medicine, Nutrition Services and Pharmacy Services  Weekly team conferences will be held on Wednesday to discuss your progress.  Your Social Worker will talk with you frequently to get your input and to update you on team discussions.  Team conferences with you and your family in attendance may also be held.  Expected length of stay: 8-10 days  Overall anticipated outcome: mod/i-wheelchair level and supervision with ambulation  Depending on your progress and recovery, your program may change. Your Social Worker will coordinate services and will keep you informed of any changes. Your Social Worker's name and contact numbers are listed  below.  The following services may also be recommended but are not provided by the [facility] will be made to provide these services after discharge if needed.  Arrangements include referral to agencies that provide these services.  Your insurance has been verified to be:  [employer] Your primary doctor is:  Lona Kettle  Pertinent information will be shared with your doctor and your insurance  company.  Social Worker:  [name], [place] or [phone]  Information discussed with and copy given to patient by: Elease Hashimoto, 10/23/2017, 10:28 AM

## 2017-10-23 NOTE — Progress Notes (Signed)
Social Work  Social Work Assessment and Plan  Patient Details  Name: Brian Herring MRN: [id number] Date of Birth: 20-Aug-1955  Today's Date: 10/23/2017  Problem List:  Patient Active Problem List   Diagnosis Date Noted  . Hypoalbuminemia due to protein-calorie malnutrition ([place])   . Leukocytosis   . Urinary retention   . Acute blood loss anemia   . Status post below knee amputation, right ([place]) 10/20/2017  . Unilateral complete BKA ([place]) 10/20/2017  . Sepsis ([place]) 10/12/2017  . Diabetic foot ulcer ([place]) 10/12/2017  . Diabetic foot infection ([place]) 10/12/2017  . Hyponatremia 10/12/2017  . Cellulitis of right foot 10/12/2017  . Chronic systolic CHF (congestive heart failure) ([place])   . Hyperkalemia 10/13/2015  . Congestive dilated cardiomyopathy ([place]) 07/13/2015  . Asymptomatic LV dysfunction 06/25/2015  . Poorly controlled diabetes mellitus ([place]) 05/31/2015  . HTN (hypertension), benign 07/04/2014  . Viral cardiomyopathy, EF improved to 45-50% 04/02/2013  . Obesity 04/02/2013  . Diabetic foot ulcer with osteomyelitis ([place]) 04/02/2013  . Hyperlipidemia 04/02/2013  . Sinus tachycardia 04/02/2013   Past Medical History:  Past Medical History:  Diagnosis Date  . Chronic systolic CHF (congestive heart failure) ([place])   . Diabetes mellitus without complication ([place])   . Hyperlipidemia   . Hypertension    Past Surgical History:  Past Surgical History:  Procedure Laterality Date  . AMPUTATION Right 10/14/2017   Procedure: AMPUTATION  GREAT TOE;  Surgeon: [name], MD;  Location: [facility];  Service: Orthopedics;  Laterality: Right;  . AMPUTATION Right 10/18/2017   Procedure: RIGHT BELOW KNEE AMPUTATION;  Surgeon: [name], MD;  Location: [facility];  Service: Orthopedics;  Laterality: Right;  . SHOULDER SURGERY  01/05   Social History:  reports that he has never smoked. He has never used smokeless tobacco. He reports that he does not drink alcohol. His drug history is not on  file.  Family / Support Systems Marital Status: Single Patient Roles: Other (Comment)(Friend) Other Supports: Gina Curry-friend [phone] Anticipated Caregiver: Self Ability/Limitations of Caregiver: Barnett Applebaum and other friends work Building control surveyor Availability: Intermittent Family Dynamics: Close with friend like family to him. He has no family who are local and only extended at that. He feels he will figure out whatever he needs to do for himself he always has and will again.  Social History Preferred language: English Religion:  Cultural Background: No issues Education: High School Read: Yes Write: Yes Employment Status: Disabled Date Retired/Disabled/Unemployed: Disabled from CHF Legal Hisotry/Current Legal Issues: No issues Guardian/Conservator: None-according to MD pt is capable of making his own decisions while here   Abuse/Neglect Abuse/Neglect Assessment Can Be Completed: Yes Physical Abuse: Denies Verbal Abuse: Denies Sexual Abuse: Denies Exploitation of patient/patient's resources: Denies Self-Neglect: Denies  Emotional Status Pt's affect, behavior adn adjustment status: Pt is motivated to do well and recover from this amputation. He feels he will be able to figure out what he needs to do to make himself independent when he leaves here. His friends will come by and check on him but can not stay with him. Recent Psychosocial Issues: other health issues have managed and still been independent Pyschiatric History: No history deferred depression screen due to coping appropriately and able to verbalize his feelings. Will ask team for input if neuro-psych needed. He seems to be doing well and positive regarding his moving forward in his recovery Substance Abuse History: No issues  Patient / Family Perceptions, Expectations & Goals Pt/Family understanding of illness & functional  limitations: Pt is able to explain his amputation and talks with the MD daily to have his questions  answered and asked about his treatment plan. He has a good understanding of his plan and where he is going from here. Premorbid pt/family roles/activities: Friend, home owner, etc Anticipated changes in roles/activities/participation: resume Pt/family expectations/goals: Pt states: " I plan on being independent before I leave here, I think it is realistic." " My friends will do whatever I need them too."  [employer]: None Premorbid Home Care/DME Agencies: None Transportation available at discharge: Self and friends Resource referrals recommended: Support group (specify)  Discharge Planning Living Arrangements: Alone Support Systems: Friends/neighbors Type of Residence: Private residence Insurance underwriter Resources: Multimedia programmer (specify)(BCBS) Financial Resources: SSD Financial Screen Referred: No Living Expenses: Own Money Management: Patient Does the patient have any problems obtaining your medications?: No Home Management: Self Patient/Family Preliminary Plans: Return home with friends coming in intermittently to assist with groceries and home managment. Pt needs to be mod/i wheelchair level before going home to be safe. He feels he will get there and probably will. Will work on discharge planning with pt. [place] Barriers to Discharge: Decreased caregiver support [place] Barriers to Discharge Comments: Does not have 24 hr care intermittent assist only Social Work Anticipated Follow Up Needs: HH/OP, Support Group  Clinical Impression Pleasant gentleman who is motivated to do well and feels can reach his mod/i level goals. His friends will check on him and bring him what he needs. Will work on discharge needs and ask team if needs to be seen by neuro-psych.   Elease Hashimoto 10/23/2017, 1:04 PM

## 2017-10-23 NOTE — IPOC Note (Addendum)
Overall Plan of Care [facility]) Patient Details Name: Brian Herring MRN: [id number] DOB: 06/07/1956  Admitting Diagnosis: Diabetic foot ulcer with osteomyelitis [facility])  Hospital Problems: Principal Problem:   Diabetic foot ulcer with osteomyelitis ([place]) Active Problems:   Poorly controlled diabetes mellitus ([place])   Chronic systolic CHF (congestive heart failure) ([place])   Status post below knee amputation, right ([facility])   Unilateral complete BKA ([facility])   Hypoalbuminemia due to protein-calorie malnutrition ([facility])   Leukocytosis   Urinary retention   Acute blood loss anemia     Functional Problem List: Nursing Endurance, Motor, Pain, Safety, Skin Integrity  PT Balance, Skin Integrity, Endurance, Motor, Pain, Safety, Edema  OT Balance, Endurance, Pain  SLP    TR         Basic ADL's: OT Grooming, Bathing, Dressing, Toileting     Advanced  ADL's: OT Simple Meal Preparation, Light Housekeeping     Transfers: PT Bed Mobility, Bed to Chair, Car, Manufacturing systems engineer, Metallurgist: PT Ambulation, Emergency planning/management officer, Stairs     Additional Impairments: OT None  SLP        TR      Anticipated Outcomes Item Anticipated Outcome  Self Feeding independent  Swallowing      Basic self-care  modified independent  Toileting  modified independent   Bathroom Transfers modified independent  Bowel/Bladder  Continent to bowel and bladder with min. assist  Transfers  modI  Locomotion  modI w/c level, S ambulation and stairs  Communication     Cognition     Pain  Less than 3,on 1 to 10 scale.  Safety/Judgment  Free from falls during his stay in rehab   Therapy Plan: PT Intensity: Minimum of 1-2 x/day ,45 to 90 minutes PT Frequency: 5 out of 7 days PT Duration Estimated Length of Stay: 7-10 days OT Intensity: Minimum of 1-2 x/day, 45 to 90 minutes OT Frequency: 5 out of 7 days OT Duration/Estimated Length of Stay: 8-10 days      Team  Interventions: Nursing Interventions Patient/Family Education, Skin Care/Wound Management  PT interventions Ambulation/gait training, Discharge planning, Functional mobility training, Psychosocial support, Therapeutic Activities, Wheelchair propulsion/positioning, Therapeutic Exercise, Skin care/wound management, Neuromuscular re-education, Disease management/prevention, Training and development officer, Cognitive remediation/compensation, DME/adaptive equipment instruction, Pain management, Splinting/orthotics, UE/LE Strength taining/ROM, UE/LE Coordination activities, Stair training, Patient/family education, Community reintegration  OT Interventions Training and development officer, Discharge planning, Pain management, Self Care/advanced ADL retraining, Therapeutic Activities, Functional mobility training, Patient/family education, Therapeutic Exercise, Community reintegration, Engineer, drilling, UE/LE Strength taining/ROM, Wheelchair propulsion/positioning  SLP Interventions    TR Interventions    SW/CM Interventions  psychosocial Assessment, Pt & Family Education & Discharge Planning   Barriers to Discharge MD  Medical stability, Neurogenic bowel and bladder and Weight bearing restrictions  Nursing      PT Weight bearing restrictions, Wound Care, Home environment access/layout, Decreased caregiver support    OT Decreased caregiver support    SLP      SW       Team Discharge Planning: Destination: PT-Home ,OT- Home , SLP-  Projected Follow-up: PT-Home health PT, OT-  Home health OT, SLP-  Projected Equipment Needs: PT-Wheelchair (measurements), Wheelchair cushion (measurements), Rolling walker with 5" wheels, OT- Tub/shower bench, 3 in 1 bedside comode, SLP-  Equipment Details: PT- , OT-  Patient/family involved in discharge planning: PT- Patient,  OT-Patient, SLP-   MD ELOS: 6-9 days. Medical Rehab Prognosis:  Good Assessment: 62 y.o. male with history of  T2DM, HTN, chronic  systolic CHF who was admitted on 10/12/17 with infected foot ulcer with osteomyelitis. He was started on IV antibiotics and underwent right transtibial amputation. He has had issues with bloody stools felt to be due to ASA and Sq heparin. H/H stable. Reactive leucocytosis. Pain has been reasonably controlled. Patient with resulting functional deficits with mobility, transfers, and self-care.  Will set goals for Mod I with PT/OT.   See Team Conference Notes for weekly updates to the plan of care

## 2017-10-23 NOTE — Plan of Care (Signed)
Pt has difficult I&O. Has FC in place. Will restart void trials tomorrow per MD order.

## 2017-10-23 NOTE — Progress Notes (Signed)
Physical Therapy Note  Patient Details  Name: AARISH ROCKERS MRN: [id number] Date of Birth: 1956-05-28 Today's Date: 10/23/2017  1455-1610, 75 min individual tx Pain: none, premedicated  Seated strengthening: 10 x 2  Hip flex, long arc quad knee ext with ankle pumps at end range, bil hip abduction using bright green Theraband, L heel/toe raises, R short arc quad extension, bil glut sets, bil hip internal rotation, 1 x 10 each.   bil trunk flexion /extension, R quad sets, 2 x 10 each.  Standing with bil UE support on bed rail, L toe raises, R hip extension, R knee extension,  1 x 1; r hip abduction  2 x 10. .  Sit> stand to standing with bil hands on back of armchaitr; pt tolerated x 1 minutes.  Pt correcty placed bil hands on armrests after self -correcting himself,  with extra time.  Sit> stand, then standing with bil UE support on raised bed rail.  Pt hopped in place x 10 x 2.    Reviewed desensitization activities and pt performed tapping and light rubbing of R residual limb.  Reviewed incentive inspirometer, and pt performed x 20.   Pt left resting in w/c with quick release belt applied, alarm set and all needs within reach.   See function navigator for current status.      Emmelia Holdsworth 10/23/2017, 12:20 PM

## 2017-10-23 NOTE — Progress Notes (Signed)
Occupational Therapy Session Note  Patient Details  Name: Brian Herring MRN: [id number] Date of Birth: 1956-04-26  Today's Date: 10/23/2017 OT Individual Time: 1030-1130 OT Individual Time Calculation (min): 60 min    Short Term Goals: Week 1:  OT Short Term Goal 1 (Week 1): STGs equal to LTGs based on ELOS  Skilled Therapeutic Interventions/Progress Updates:    Pt seen for OT session focusing on functional mobility, transfers, w/c positioning and propulsion. Pt in supine upon arrival, voicing 3/10 pain in R residual limb, however, voiced being pre-medicated prior to tx session and agreeable to cont on with therapy. Completed min A squat pivot transfers throughout session with education for w/c parts management. Pt desiring to work on R LE positioning due to discomfort with resting position. Trialed sliding board under extremity for support as well as various towels, etc for positioning. Education provided regarding importance of maintaining neutral positioning at knee and hips. Pt voiced increased comfort with rolled towel placed under residual calf. Pt provided with inspection mirror and provided education and demonstration regarding use and importance of assessing and maintaining skin integrity on R residual limb. Pt propelled w/c throughout unit with supervision, VCs for effect propulsion technique. Pt with difficulty obtaining and mainntaining functional grasp on w/c rims therefore provided w/c gloves and theraband placed around rims for increased traction. Pt voicing feeling "down" when stuck in his room, education provided regarding areas of unit pt can access outside of tx sessions and window blinds opened for sunlight. He returned to room at end of session, left seated in w/c with all needs in reach.  Education provided throughout session regarding importance of maintaining skin integrity and proper positioning in sitting position in prep for prosthetic, SME and d/c planning including home  accessibility and home measurement sheet.  Therapy Documentation Precautions:  Precautions Precautions: Fall Precaution Comments: questionable bed bugs Restrictions Weight Bearing Restrictions: Yes RLE Weight Bearing: Non weight bearing   See Function Navigator for Current Functional Status.   Therapy/Group: Individual Therapy  Doriana Mazurkiewicz L 10/23/2017, 7:17 AM

## 2017-10-24 ENCOUNTER — Other Ambulatory Visit: Payer: Self-pay

## 2017-10-24 ENCOUNTER — Inpatient Hospital Stay (HOSPITAL_COMMUNITY): Payer: BLUE CROSS/BLUE SHIELD | Admitting: Occupational Therapy

## 2017-10-24 ENCOUNTER — Inpatient Hospital Stay (HOSPITAL_COMMUNITY): Payer: BLUE CROSS/BLUE SHIELD | Admitting: Physical Therapy

## 2017-10-24 ENCOUNTER — Encounter (HOSPITAL_COMMUNITY): Payer: Self-pay

## 2017-10-24 LAB — BASIC METABOLIC PANEL
ANION GAP: 11 (ref 5–15)
BUN: 24 mg/dL — ABNORMAL HIGH (ref 6–20)
CALCIUM: 8.8 mg/dL — AB (ref 8.9–10.3)
CO2: 26 mmol/L (ref 22–32)
Chloride: 97 mmol/L — ABNORMAL LOW (ref 101–111)
Creatinine, Ser: 0.83 mg/dL (ref 0.61–1.24)
GFR calc Af Amer: >60 mL/min (ref >60)
GFR calc non Af Amer: >60 mL/min (ref >60)
GLUCOSE: 162 mg/dL — AB (ref 65–99)
Potassium: 4.3 mmol/L (ref 3.5–5.1)
Sodium: 134 mmol/L — ABNORMAL LOW (ref 135–145)

## 2017-10-24 LAB — GLUCOSE, CAPILLARY
GLUCOSE-CAPILLARY: 159 mg/dL — AB (ref 65–99)
GLUCOSE-CAPILLARY: 253 mg/dL — AB (ref 65–99)
Glucose-Capillary: 141 mg/dL — ABNORMAL HIGH (ref 65–99)
Glucose-Capillary: 140 mg/dL — ABNORMAL HIGH (ref 65–99)

## 2017-10-24 LAB — CBC
HEMATOCRIT: 32.2 % — AB (ref 39.0–52.0)
Hemoglobin: 10 g/dL — ABNORMAL LOW (ref 13.0–17.0)
MCH: 28.7 pg (ref 26.0–34.0)
MCHC: 31.1 g/dL (ref 30.0–36.0)
MCV: 92.5 fL (ref 78.0–100.0)
Platelets: 529 10*3/uL — ABNORMAL HIGH (ref 150–400)
RBC: 3.48 MIL/uL — ABNORMAL LOW (ref 4.22–5.81)
RDW: 15.2 % (ref 11.5–15.5)
WBC: 13.3 10*3/uL — ABNORMAL HIGH (ref 4.0–10.5)

## 2017-10-24 MED ORDER — INSULIN ASPART 100 UNIT/ML ~~LOC~~ SOLN
8.0000 [IU] | Freq: Three times a day (TID) | SUBCUTANEOUS | Status: DC
  Administered 2017-10-24 – 2017-10-26 (×8): 8 [IU] via SUBCUTANEOUS

## 2017-10-24 NOTE — Progress Notes (Addendum)
Physical Therapy Session Note  Patient Details  Name: Brian Herring MRN: [id number] Date of Birth: 1956-02-05  Today's Date: 10/24/2017 PT Individual Time: 1100-1200 PT Individual Time Calculation (min): 60 min   Short Term Goals: Week 1:  PT Short Term Goal 1 (Week 1): =LTG due to estimated LOS  Skilled Therapeutic Interventions/Progress Updates: Pt received seated in w/c with handoff from OT; c/o pain as below and agreeable to treatment. W/c propulsion with S using BUE. Stand pivot transfer w/c <>mat table with S. Pt performs w/c setup including removing leg rests and placement of chair with min cues. Prone lying x3 min for hip flexor stretch. Prone hip extension 2x15 reps, sidelying hi abduction 2x15, supine bridging 2x15. Provided handout for home measurements to assess fit of w/c in house and address any other areas of concern. Pt declines need for ramp handout; reports he has found a temporary ramp or friends are very capable of building one if necessary. Pt reports he does not currently have a mattress at home, has been sleeping on the couch but plans on getting one either before d/c or shortly after. Friend to work on ramp for home entry. Russian twist and semi-reclined sit ups with 2kg weighted ball for core strengthening, 2x15-20 reps each. Sit <>stand with RW x10 reps for LLE strengthening. Seated tricep press ups 2x10 with cues for scapular depression. 1x20 reps scapular rowing with level 3 green theraband. Unable to perform BUE shoulder ER with resisted band d/t history of rotator cuff tear; performed gravity resisted in sidelying 2x15 reps. Squat pivot to return to w/c with S. W/c propulsion S to room; slow speed and cues for technique. Remained seated in w/c at end of session, all needs in reach.      Therapy Documentation Precautions:  Precautions Precautions: Fall Precaution Comments: questionable bed bugs Restrictions Weight Bearing Restrictions: Yes RLE Weight Bearing: Non  weight bearing Pain: Pain Assessment Pain Scale: 0-10 Pain Score: 2  Pain Type: Acute pain;Surgical pain Pain Location: Incision Pain Orientation: Right  See Function Navigator for Current Functional Status.   Therapy/Group: Individual Therapy  Luberta Mutter 10/24/2017, 12:03 PM

## 2017-10-24 NOTE — Progress Notes (Signed)
Occupational Therapy Session Note  Patient Details  Name: Brian Herring MRN: [id number] Date of Birth: Mar 19, 1956  Today's Date: 10/24/2017 OT Individual Time: 0903-1000 OT Individual Time Calculation (min): 57 min    Short Term Goals: Week 1:  OT Short Term Goal 1 (Week 1): STGs equal to LTGs based on ELOS  Skilled Therapeutic Interventions/Progress Updates:    Pt completed bathing and dressing sitting on the EOB.  Lateral leans for washing peri area and for pulling pants over hips with close supervision.  He was able to complete all bathing with supervision as well.  Min guard assist for squat pivot transfer to the wheelchair from the EOB.  He then completed wheelchair mobility to the therapy gym where therapist supplied him a RW.  He was able to stand with min assist and complete stand pivot transfer from the wheelchair to the mat and back.  Returned to room at end of session with call button and phone in reach and safety belt and chair alarm in place.    Therapy Documentation Precautions:  Precautions Precautions: Fall Precaution Comments: questionable bed bugs Restrictions Weight Bearing Restrictions: Yes RLE Weight Bearing: Non weight bearing   Pain: Pain Assessment Pain Scale: 0-10 Pain Score: 4  Pain Type: Surgical pain Pain Location: Leg Pain Orientation: Right Pain Descriptors / Indicators: Discomfort Pain Onset: With Activity Pain Intervention(s): Repositioned ADL: See Function Navigator for Current Functional Status.   Therapy/Group: Individual Therapy  Malyssa Maris OTR/L 10/24/2017, 12:16 PM

## 2017-10-24 NOTE — Progress Notes (Signed)
Subjective/Complaints: Pt seen lying in bed this AM.  He states he slept better overnight and feels better this AM as a result of having slept well.   ROS  Denies CP, SOB, N/V/D.  Objective: Vital Signs: Blood pressure 116/68, pulse 93, temperature 98.4 F (36.9 C), temperature source Oral, resp. rate 20, height 6' (1.829 m), weight 103.3 kg (227 lb 11.8 oz), SpO2 96 %. No results found. Results for orders placed or performed during the hospital encounter of 10/20/17 (from the past 72 hour(s))  Glucose, capillary     Status: Abnormal   Collection Time: 10/21/17 12:00 PM  Result Value Ref Range   Glucose-Capillary 203 (H) 65 - 99 mg/dL  Glucose, capillary     Status: Abnormal   Collection Time: 10/21/17  4:45 PM  Result Value Ref Range   Glucose-Capillary 179 (H) 65 - 99 mg/dL  Glucose, capillary     Status: Abnormal   Collection Time: 10/21/17  9:11 PM  Result Value Ref Range   Glucose-Capillary 231 (H) 65 - 99 mg/dL   Comment 1 Notify RN   Glucose, capillary     Status: Abnormal   Collection Time: 10/22/17  6:31 AM  Result Value Ref Range   Glucose-Capillary 157 (H) 65 - 99 mg/dL  Glucose, capillary     Status: Abnormal   Collection Time: 10/22/17 11:28 AM  Result Value Ref Range   Glucose-Capillary 242 (H) 65 - 99 mg/dL  Urinalysis, Routine w reflex microscopic     Status: Abnormal   Collection Time: 10/22/17  1:59 PM  Result Value Ref Range   Color, Urine RED (A) YELLOW    Comment: BIOCHEMICALS MAY BE AFFECTED BY COLOR   APPearance CLOUDY (A) CLEAR   Specific Gravity, Urine 1.014 1.005 - 1.030   pH 9.0 (H) 5.0 - 8.0   Glucose, UA >=500 (A) NEGATIVE mg/dL   Hgb urine dipstick LARGE (A) NEGATIVE   Bilirubin Urine NEGATIVE NEGATIVE   Ketones, ur NEGATIVE NEGATIVE mg/dL   Protein, ur >300 (A) NEGATIVE mg/dL   Nitrite NEGATIVE NEGATIVE   Leukocytes, UA NEGATIVE NEGATIVE    Comment: Performed at [facility], [address], [place], [place] [address]  Urinalysis,  Microscopic (reflex)     Status: None   Collection Time: 10/22/17  1:59 PM  Result Value Ref Range   RBC / HPF TOO NUMEROUS TO COUNT 0 - 5 RBC/hpf   WBC, UA TOO NUMEROUS TO COUNT 0 - 5 WBC/hpf   Bacteria, UA NONE SEEN NONE SEEN   Squamous Epithelial / LPF NONE SEEN NONE SEEN   WBC Casts, UA PRESENT     Comment: Performed at [facility], [place] [address], [place], [place] [address]  Glucose, capillary     Status: Abnormal   Collection Time: 10/22/17  4:57 PM  Result Value Ref Range   Glucose-Capillary 232 (H) 65 - 99 mg/dL  Glucose, capillary     Status: Abnormal   Collection Time: 10/22/17  9:46 PM  Result Value Ref Range   Glucose-Capillary 253 (H) 65 - 99 mg/dL  CBC     Status: Abnormal   Collection Time: 10/23/17  4:21 AM  Result Value Ref Range   WBC 13.0 (H) 4.0 - 10.5 K/uL   RBC 3.46 (L) 4.22 - 5.81 MIL/uL   Hemoglobin 10.2 (L) 13.0 - 17.0 g/dL   HCT 31.9 (L) 39.0 - 52.0 %   MCV 92.2 78.0 - 100.0 fL   MCH  29.5 26.0 - 34.0 pg   MCHC 32.0 30.0 - 36.0 g/dL   RDW 15.1 11.5 - 15.5 %   Platelets 482 (H) 150 - 400 K/uL    Comment: Performed at [facility], [place] [address], [place], [place] [address]  Glucose, capillary     Status: Abnormal   Collection Time: 10/23/17  6:14 AM  Result Value Ref Range   Glucose-Capillary 255 (H) 65 - 99 mg/dL  Glucose, capillary     Status: Abnormal   Collection Time: 10/23/17 11:50 AM  Result Value Ref Range   Glucose-Capillary 221 (H) 65 - 99 mg/dL   Comment 1 Notify RN   Glucose, capillary     Status: Abnormal   Collection Time: 10/23/17  5:10 PM  Result Value Ref Range   Glucose-Capillary 170 (H) 65 - 99 mg/dL   Comment 1 Notify RN   Glucose, capillary     Status: Abnormal   Collection Time: 10/23/17  9:25 PM  Result Value Ref Range   Glucose-Capillary 247 (H) 65 - 99 mg/dL  CBC     Status: Abnormal   Collection Time: 10/24/17  6:41 AM  Result Value Ref Range   WBC 13.3 (H) 4.0 - 10.5 K/uL   RBC 3.48 (L) 4.22 - 5.81  MIL/uL   Hemoglobin 10.0 (L) 13.0 - 17.0 g/dL   HCT 32.2 (L) 39.0 - 52.0 %   MCV 92.5 78.0 - 100.0 fL   MCH 28.7 26.0 - 34.0 pg   MCHC 31.1 30.0 - 36.0 g/dL   RDW 15.2 11.5 - 15.5 %   Platelets 529 (H) 150 - 400 K/uL    Comment: Performed at [facility], [place], [place], [place] [address]  Glucose, capillary     Status: Abnormal   Collection Time: 10/24/17  6:57 AM  Result Value Ref Range   Glucose-Capillary 141 (H) 65 - 99 mg/dL    Gen NAD. Well-developed. HEENT: Normocephalic, atraumatic.  Cardio: RRR and no JVD Resp: CTA B/L and unlabored GI: BS positive and ND Musc/Skel:  Right BKA TTP Neuro: Alert/Oriented Motor: 4/5 R HF (stable) LLE: 4+/5 proximal to distal B/l UE: 5/5 proximal to distal Skin:   +VAC  Assessment/Plan: 1. Functional deficits secondary to Right BKA which require 3+ hours per day of interdisciplinary therapy in a comprehensive inpatient rehab setting. Physiatrist is providing close team supervision and 24 hour management of active medical problems listed below. Physiatrist and rehab team continue to assess barriers to discharge/monitor patient progress toward functional and medical goals. FIM: Function - Bathing Position: Sitting EOB Body parts bathed by patient: Right arm, Left arm, Chest, Abdomen, Front perineal area, Buttocks, Right upper leg, Left upper leg, Left lower leg Body parts bathed by helper: Back Assist Level: Touching or steadying assistance(Pt > 75%)(per OT note)  Function- Upper Body Dressing/Undressing What is the patient wearing?: Pull over shirt/dress Pull over shirt/dress - Perfomed by patient: Thread/unthread right sleeve, Thread/unthread left sleeve, Put head through opening, Pull shirt over trunk Assist Level: Set up Function - Lower Body Dressing/Undressing What is the patient wearing?: Non-skid slipper socks, Socks, Pants Position: Sitting EOB Pants- Performed by patient: Thread/unthread right pants leg,  Thread/unthread left pants leg Pants- Performed by helper: Pull pants up/down Non-skid slipper socks- Performed by patient: Don/doff left sock(right sock NA secondary to amputation) Assist for footwear: Supervision/touching assist Assist for lower body dressing: Touching or steadying assistance (Pt > 75%)  Function - Toileting Toileting activity  did not occur: No continent bowel/bladder event Toileting steps completed by patient: Performs perineal hygiene(no underwear) Toileting Assistive Devices: Grab bar or rail Assist level: Touching or steadying assistance (Pt.75%)  Function - Air cabin crew transfer activity did not occur: N/A Toilet transfer assistive device: Grab bar, Bedside commode Assist level to toilet: Touching or steadying assistance (Pt > 75%) Assist level from toilet: Touching or steadying assistance (Pt > 75%) Assist level to bedside commode (at bedside): Touching or steadying assistance (Pt > 75%) Assist level from bedside commode (at bedside): Moderate assist (Pt 50 - 74%/lift or lower)  Function - Chair/bed transfer Chair/bed transfer method: Stand pivot Chair/bed transfer assist level: Touching or steadying assistance (Pt > 75%) Chair/bed transfer assistive device: Walker Chair/bed transfer details: Verbal cues for technique, Verbal cues for precautions/safety, Verbal cues for safe use of DME/AE  Function - Locomotion: Wheelchair Will patient use wheelchair at discharge?: Yes Type: Manual Max wheelchair distance: 150 Assist Level: Touching or steadying assistance (Pt > 75%) Assist Level: Touching or steadying assistance (Pt > 75%) Assist Level: Touching or steadying assistance (Pt > 75%) Turns around,maneuvers to table,bed, and toilet,negotiates 3% grade,maneuvers on rugs and over doorsills: No Function - Locomotion: Ambulation Assistive device: Walker-rolling Max distance: 8 Assist level: Touching or steadying assistance (Pt > 75%) Walk 10 feet  activity did not occur: Safety/medical concerns(fatigue) Walk 50 feet with 2 turns activity did not occur: Safety/medical concerns Walk 150 feet activity did not occur: Safety/medical concerns Walk 10 feet on uneven surfaces activity did not occur: Safety/medical concerns  Function - Comprehension Comprehension: Auditory Comprehension assist level: Understands complex 90% of the time/cues 10% of the time  Function - Expression Expression: Verbal Expression assist level: Expresses complex ideas: With extra time/assistive device  Function - Social Interaction Social Interaction assist level: Interacts appropriately with others with medication or extra time (anti-anxiety, antidepressant).  Function - Problem Solving Problem solving assist level: Solves basic problems with no assist  Function - Memory Memory assist level: Recognizes or recalls 90% of the time/requires cueing < 10% of the time Patient normally able to recall (first 3 days only): Current season, Location of own room, Staff names and faces, That he or she is in a hospital  Medical Problem List and Plan:  1. Functional and mobility deficits secondary to right BKA     Cont CIR 2. DVT Prophylaxis/Anticoagulation: Pharmaceutical: Lovenox  3. Pain Management: Oxycodone prn effective  4. Mood: LCSW to follow for evaluation and support.  5. Neuropsych: This patient is capable of making decisions on his own behalf.  6. Skin/Wound Care: Routine pressure relief measures.    Vac x 7 days to right BK incision pressure (d/c 4/3) 7. Fluids/Electrolytes/Nutrition: Monitor I/O.  8. T2DM: Monitor BS ac/hs continue    Lantus at bedtime with meal coverage CBG (last 3)  Recent Labs    10/23/17 1710 10/23/17 2125 10/24/17 0657  GLUCAP 170* 247* 141*    Increased Lantus to 20U on 3/31   Novolog increased to 8U on 4/2   Remains elevated, consider further increase tomorrow 9. Mild hyponatremia:  Resolved   Na 137 on 3/30 10 ABLA:     Hb 10.0 on 4/2 11. CAD with chronic systolic CHF: Monitor daily weights and for other signs of overload.  Filed Weights   10/22/17 0419 10/23/17 0448 10/24/17 0707  Weight: 102.7 kg (226 lb 6.6 oz) 99.8 kg (220 lb 0.3 oz) 103.3 kg (227 lb 11.8 oz)  12.  Urinary retention  Difficulty with cathing has foley    Flomax started    Foley d/ced 4/2, voiding trial 13. Leukocytosis   WBCs 13.3 on 4/2   Afebrile  UA unremarkable for infection, Ucx ordered  Cont to monitor 14. Hypoalbuminemia   Supplement initiated  LOS (Days) 4 A FACE TO FACE EVALUATION WAS PERFORMED  Sofhia Ulibarri Lorie Phenix 10/24/2017, 7:50 AM

## 2017-10-24 NOTE — Progress Notes (Signed)
Occupational Therapy Session Note  Patient Details  Name: Brian Herring MRN: [id number] Date of Birth: 12/28/1955  Today's Date: 10/24/2017 OT Individual Time: 1300-1400 OT Individual Time Calculation (min): 60 min    Short Term Goals: Week 1:  OT Short Term Goal 1 (Week 1): STGs equal to LTGs based on ELOS  Skilled Therapeutic Interventions/Progress Updates:    Pt completed wheelchair mobility down to the therapy gym with supervision and then transferred to the therapy mat with min assist stand pivot.  He was able to complete sit to stand and standing balance tasks with the walker while alternating UE use to complete table top task.  Occasional LOB noted when attempting to let go with the RUE to complete task.  Min guard assist for balance when letting go with the LUE.  He tolerated several intervals of standing for 2-4 mins before needing rest breaks.  Returned to room at end of session with pt left up in the wheelchair with call button and phone in reach.    Therapy Documentation Precautions:  Precautions Precautions: Fall Precaution Comments: questionable bed bugs Restrictions Weight Bearing Restrictions: Yes RLE Weight Bearing: Non weight bearing  Pain: Pain Assessment Pain Scale: 0-10 Pain Score: 2  Faces Pain Scale: Hurts little more Pain Type: Surgical pain Pain Location: Leg Pain Orientation: Right Pain Descriptors / Indicators: Discomfort Pain Onset: With Activity Pain Intervention(s): Emotional support;Repositioned ADL: See Function Navigator for Current Functional Status.   Therapy/Group: Individual Therapy  Gerrald Basu OTR/L 10/24/2017, 3:44 PM

## 2017-10-24 NOTE — Progress Notes (Signed)
Occupational Therapy Session Note  Patient Details  Name: VELDON WAGER MRN: [id number] Date of Birth: [birth year]  Today's Date: 10/24/2017 OT Individual Time: 1030-1100 OT Individual Time Calculation (min): 30 min    Short Term Goals: Week 1:  OT Short Term Goal 1 (Week 1): STGs equal to LTGs based on ELOS  Skilled Therapeutic Interventions/Progress Updates:    1:1 Focus on w/c management of parts for set for transfers. Setup w/c with minimal cues in prep for standing and ambulating into bathroom with RW with min A. Pt perform tub bench transfer with min guard to access shower. Pt then ambulated to toilet with BSC over it with min A with extra time for RW management.  Able to safely ambulated out of bathroom to w/c with min A . Performed squat pivot off and on low couch in close supervision with min verbal cues for proper setup.  Pt reported he wants a BSC for over the commode for UE support with sit<>stands. Pass off to next therapist.   Therapy Documentation Precautions:  Precautions Precautions: Fall Precaution Comments: questionable bed bugs Restrictions Weight Bearing Restrictions: Yes RLE Weight Bearing: Non weight bearing Pain:  no c/o pain - soreness at end of residual limb when supported by leg rest See Function Navigator for Current Functional Status.   Therapy/Group: Individual Therapy  [facility] 10/24/2017, 2:04 PM

## 2017-10-25 ENCOUNTER — Encounter (HOSPITAL_COMMUNITY): Payer: BLUE CROSS/BLUE SHIELD | Admitting: Psychology

## 2017-10-25 ENCOUNTER — Inpatient Hospital Stay (HOSPITAL_COMMUNITY): Payer: BLUE CROSS/BLUE SHIELD

## 2017-10-25 ENCOUNTER — Inpatient Hospital Stay (HOSPITAL_COMMUNITY): Payer: BLUE CROSS/BLUE SHIELD | Admitting: Occupational Therapy

## 2017-10-25 ENCOUNTER — Inpatient Hospital Stay (HOSPITAL_COMMUNITY): Payer: BLUE CROSS/BLUE SHIELD | Admitting: Physical Therapy

## 2017-10-25 LAB — GLUCOSE, CAPILLARY
GLUCOSE-CAPILLARY: 121 mg/dL — AB (ref 65–99)
Glucose-Capillary: 193 mg/dL — ABNORMAL HIGH (ref 65–99)
Glucose-Capillary: 113 mg/dL — ABNORMAL HIGH (ref 65–99)
Glucose-Capillary: 191 mg/dL — ABNORMAL HIGH (ref 65–99)

## 2017-10-25 LAB — URINE CULTURE: CULTURE: NO GROWTH

## 2017-10-25 MED ORDER — TAMSULOSIN HCL 0.4 MG PO CAPS
0.8000 mg | ORAL_CAPSULE | Freq: Every day | ORAL | Status: DC
  Administered 2017-10-25 – 2017-10-30 (×6): 0.8 mg via ORAL
  Filled 2017-10-25 (×7): qty 2

## 2017-10-25 MED ORDER — INSULIN GLARGINE 100 UNIT/ML ~~LOC~~ SOLN
24.0000 [IU] | Freq: Every day | SUBCUTANEOUS | Status: DC
  Administered 2017-10-25 – 2017-10-30 (×6): 24 [IU] via SUBCUTANEOUS
  Filled 2017-10-25 (×6): qty 0.24

## 2017-10-25 NOTE — Progress Notes (Signed)
Occupational Therapy Session Note  Patient Details  Name: Brian Herring MRN: [id number] Date of Birth: [birth year]  Today's Date: 10/25/2017 OT Individual Time: 0800-0900 OT Individual Time Calculation (min): 60 min    Short Term Goals: Week 1:  OT Short Term Goal 1 (Week 1): STGs equal to LTGs based on ELOS  Skilled Therapeutic Interventions/Progress Updates:    Pt completed bathing and dressing sit to stand on the EOB during session.  He initially was sitting on the toilet so needed min assist for toilet hygiene and for squat pivot transfer back to the EOB, as BSC was at bedside.  He completed all UB bathing with supervision and LB bathing at the same level with lateral and posterior leans to wash peri area.  Min assist for sit to stand during LB dressing in order to pull pants over his hips and complete stand pivot transfer to the wheelchair.  Grooming tasks completed at wheelchair level at the sink as well with setup only.  Finished session with pt in the wheelchair at bedside and nursing giving medications.   Therapy Documentation Precautions:  Precautions Precautions: Fall Precaution Comments: questionable bed bugs Restrictions Weight Bearing Restrictions: Yes RLE Weight Bearing: Non weight bearing   Pain: Pain Assessment Pain Scale: 0-10 Pain Score: 2  Pain Type: Surgical pain Pain Location: Leg Pain Orientation: Right Pain Descriptors / Indicators: Discomfort Pain Onset: On-going Pain Intervention(s): Emotional support;Repositioned ADL: See Function Navigator for Current Functional Status.   Therapy/Group: Individual Therapy  Brian Herring OTR/L 10/25/2017, 9:00 AM

## 2017-10-25 NOTE — Progress Notes (Signed)
Occupational Therapy Session Note  Patient Details  Name: Brian Herring MRN: [id number] Date of Birth: 08/10/1955  Today's Date: 10/25/2017 OT Individual Time: 0930-1000 OT Individual Time Calculation (min): 30 min    Short Term Goals: Week 1:  OT Short Term Goal 1 (Week 1): STGs equal to LTGs based on ELOS   Skilled Therapeutic Interventions/Progress Updates:    Pt received sititng up in w/c with no c/o pain and agreeable to therapy. Extensive education provided and teach-back performed re desensitization techniques with R residual limb, including tapping and other tactile input. Education provided re neuro-mechanisms of desensitization strategies. Pt propelled himself 150 ft to rehab gym and transferred to mat via lateral scoot with (S). Pt then stood with RW, completing 4 intervals of standing level functional reaching. Pt required min A to stabilize dynamic standing balance when removing R UE from RW and reaching outside of BOS. Pt required vc for seated rest breaks d/t progressive fatigue. Pt returned to room with chair alarm activated and all needs met.   Therapy Documentation Precautions:  Precautions Precautions: Fall Precaution Comments: questionable bed bugs Restrictions Weight Bearing Restrictions: Yes RLE Weight Bearing: Non weight bearing   Pain: Pain Assessment Pain Scale: 0-10 Pain Score: 0-No pain Faces Pain Scale: No hurt Pain Type: Acute pain Pain Location: Leg Pain Orientation: Right Pain Descriptors / Indicators: Aching Pain Frequency: Intermittent Pain Onset: On-going Patients Stated Pain Goal: 0 Pain Intervention(s): Medication (See eMAR)  See Function Navigator for Current Functional Status.   Therapy/Group: Individual Therapy  Curtis Sites 10/25/2017, 12:05 PM

## 2017-10-25 NOTE — Progress Notes (Signed)
Physical Therapy Session Note  Patient Details  Name: IGNATZ DEIS MRN: [id number] Date of Birth: 1956/07/12  Today's Date: 10/25/2017 PT Concurrent Time: 1300-1400 PT Concurrent Time Calculation (min): 60 min  Short Term Goals: Week 1:  PT Short Term Goal 1 (Week 1): =LTG due to estimated LOS  Skilled Therapeutic Interventions/Progress Updates: Pt received seated in w/c, denies pain and agreeable to treatment. W/c propulsion modI to/from gym with BUE for strengthening and endurance. Stand pivot transfer x2 with RW and min guard w/c <>mat table. Prone lying x4 min for hip flexor stretch. Prone hip extension and thoracic extension 2x15 reps. Sidelying, supine straight leg raises 2x15 BLE. Sit <>stand 2x10 reps with S from mat table with RW. Gait x10' with RW and min guard, limited by UE fatigue. Educated pt on UE technique to improve foot clearance and reduce impact of LLE on floor for skin protection. Standing heel raises x10 reps LLE. Returned to room modI w/c propulsion, remained in w/c, all needs in reach.      Therapy Documentation Precautions:  Precautions Precautions: Fall Precaution Comments: questionable bed bugs Restrictions Weight Bearing Restrictions: Yes RLE Weight Bearing: Non weight bearing   See Function Navigator for Current Functional Status.   Therapy/Group: Concurrent treatment  Luberta Mutter 10/25/2017, 2:08 PM

## 2017-10-25 NOTE — Consult Note (Signed)
Neuropsychological Consultation   Patient:   Brian Herring   DOB:   Jan 09, 1956  MR Number:  [id number]  Location:  [place] [address] [facility] [address] [id number] [place] [address] Dept: [place]: [phone]           Date of Service:   10/25/2017  Start Time:   11 AM End Time:   12 PM  Provider/Observer:  Ilean Skill, Psy.D.       Clinical Neuropsychologist       Billing Code/Service: [phone] 4 Units  Chief Complaint:    Brian Herring is a 62 year old male with history of T2DM, HTN, chronic systolic CHF.  Admitted on 10/12/2017 with infected foot ulcer with osteomyelitis.  Underwent right transtibial amputation.  Patient had been coping with loss, but is having adjustment issues increase with bereavement issues related to loss of lower leg and worry about being a burden on GF.  Patient reports that GF shuts him off with talk of being a burden.  Patient having a hard time seeing self coping even with effective use of prosthetic in the future.    Reason for Service:  Mr. Junkin was referred for neuropsychological consultation due to adjustment and coping issues.  Below is the HPI for the current admission.  HPI: Brian Herring is a 63 y.o. male with history of T2DM, HTN, chronic systolic CHF who was admitted on 10/12/17 with infected foot ulcer with osteomyelitis. He was started on IV antibiotics and underwent right transtibial amputation. He has had issues with bloody stools felt to be due to ASA and Sq heparin. H/H stable. Mild hyponateremia noted and reactive leucocytosis resolving. Pain has been reasonably controlled. Therapy evaluations done today and CIR recommended due to functional deficits.  Current Status:  The patient was tearful when talking about how the loss of his leg will impact his girl friend as he knows he will need her help once he returns to home.  Patient is struggling with thoughts of  retiring from work and what that will mean for him financially.  The patient is doing well cognitively.  Patient does not have complete symptoms of depressive events, but feelings of loss need to be monitored regarding possibly developing into a depressive state.    Behavioral Observation: Brian Herring  presents as a 62 y.o.-year-old Right Caucasian Male who appeared his stated age. his dress was Appropriate and he was Well Groomed and his manners were Appropriate to the situation.  his participation was indicative of Appropriate and Attentive behaviors.  There were any physical disabilities noted.  he displayed an appropriate level of cooperation and motivation.     Interactions:    Active Appropriate and Attentive  Attention:   within normal limits and attention span and concentration were age appropriate  Memory:   within normal limits; recent and remote memory intact  Visuo-spatial:  within normal limits  Speech (Volume):  normal  Speech:   normal;   Thought Process:  Coherent and Relevant  Though Content:  WNL; not suicidal and not homicidal  Orientation:   person, place, time/date and situation  Judgment:   Good  Planning:   Good  Affect:    Tearful  Mood:    Dysphoric  Insight:   Good  Intelligence:   normal  Medical History:   Past Medical History:  Diagnosis Date  . Chronic systolic CHF (congestive heart failure) ([place])   .  Diabetes mellitus without complication ([place])   . Hyperlipidemia   . Hypertension         Psychiatric History:  Patient denies past history of major depression or other psychiatric issues.  Family Med/Psych History:  Family History  Problem Relation Age of Onset  . Pneumonia Mother        aspiration    Risk of Suicide/Violence: low Patient denies SI or HI.  Impression/DX:  [name] is a 62 year old male with history of T2DM, HTN, chronic systolic CHF.  Admitted on 10/12/2017 with infected foot ulcer with osteomyelitis.   Underwent right transtibial amputation.  Patient had been coping with loss, but is having adjustment issues increase with bereavement issues related to loss of lower leg and worry about being a burden on GF.  Patient reports that GF shuts him off with talk of being a burden.  Patient having a hard time seeing self coping even with effective use of prosthetic in the future.    The patient was tearful when talking about how the loss of his leg will impact his girl friend as he knows he will need her help once he returns to home.  Patient is struggling with thoughts of retiring from work and what that will mean for him financially.  The patient is doing well cognitively.  Patient does not have complete symptoms of depressive events, but feelings of loss need to be monitored regarding possibly developing into a depressive state.   Disposition/Plan:  Will need to monitor possible development of major depression developing.  Now just coping with loss of leg and functional loss.  Worked on coping and adjustment issues.  Diagnosis:    Status post amputation         Electronically Signed   _______________________ Ilean Skill, Psy.D.

## 2017-10-25 NOTE — Progress Notes (Signed)
Social Work Patient ID: Brian Herring, male   DOB: [birth year], 62 y.o.   MRN: [id number]  Met with pt to discuss team conference goals mod/i wheelchair level and supervision with ambulation. He feels he is doing well and glad he will not have to ask for too much help form his friends. He is aware of discharge date 4/9. Discussing equipment and follow up therapies, will work toward next Tuesday.

## 2017-10-25 NOTE — Patient Care Conference (Signed)
Inpatient RehabilitationTeam Conference and Plan of Care Update Date: 10/25/2017   Time: 2:25 PM    Patient Name: Brian Herring      Medical Record Number: [id number]  Date of Birth: 1955-07-28 Sex: Male         Room/Bed: 4M02C/4M02C-01 Payor Info: Payor: BLUE CROSS BLUE SHIELD / Plan: BCBS OTHER / Product Type: *No Product type* /    Admitting Diagnosis: bka  Admit Date/Time:  10/20/2017  3:27 PM Admission Comments: No comment available   Primary Diagnosis:  Diabetic foot ulcer with osteomyelitis ([name]) Principal Problem: Diabetic foot ulcer with osteomyelitis [facility])  Patient Active Problem List   Diagnosis Date Noted  . Hypoalbuminemia due to protein-calorie malnutrition ([name])   . Leukocytosis   . Urinary retention   . Acute blood loss anemia   . Status post below knee amputation, right ([name]) 10/20/2017  . Unilateral complete BKA ([name]) 10/20/2017  . Sepsis ([name]) 10/12/2017  . Diabetic foot ulcer ([name]) 10/12/2017  . Diabetic foot infection ([name]) 10/12/2017  . Hyponatremia 10/12/2017  . Cellulitis of right foot 10/12/2017  . Chronic systolic CHF (congestive heart failure) ([name])   . Hyperkalemia 10/13/2015  . Congestive dilated cardiomyopathy ([name]) 07/13/2015  . Asymptomatic LV dysfunction 06/25/2015  . Poorly controlled diabetes mellitus ([name]) 05/31/2015  . HTN (hypertension), benign 07/04/2014  . Viral cardiomyopathy, EF improved to 45-50% 04/02/2013  . Obesity 04/02/2013  . Diabetic foot ulcer with osteomyelitis ([place]) 04/02/2013  . Hyperlipidemia 04/02/2013  . Sinus tachycardia 04/02/2013    Expected Discharge Date: Expected Discharge Date: 10/31/17  Team Members Present: Physician leading conference: Dr. Delice Lesch Social Worker Present: [name], LCSW Nurse Present: [name], RN PT Present: [name], PT OT Present: [name], OT SLP Present: [name], SLP PPS Coordinator present : Daiva Nakayama, RN, CRRN     Current Status/Progress Goal  Weekly Team Focus  Medical   Functional and mobility deficits secondary to right BKA  Improve mobility, transfers, wound, CBGs, urinary retention  See above   Bowel/Bladder   LBM 04/02  Resume normal voiding pattern normal bowel regiment  Continue PVR with In and Out Cath laxatives prn assist to toilet as needed   Swallow/Nutrition/ Hydration             ADL's   Pt completes bathing with supervision to min assist sit to stand.  He also performs dressing at the same level.  Min assist for stand pivot transfers with the RW.  Supervision for transfers squat pivot.    modified independent  selfcare retraining, transfer training, balance retraining, therapeutic exercise, pt/family education,    Mobility   S bed mobility, min guard transfers, min guard/minA gait with RW x10', stairs NT  modI w/c level, S gait and stairs  activity tolerance, UE/LE strengthening, transfer/gait training, ongoing amputee education   Communication             Safety/Cognition/ Behavioral Observations            Pain   denies pain at present pt takes Oxycodone IR 10-15mg  prn mod-severe pain and tylenol 325-650mg  prn for mild pain with good relief  pt will have pain that is <=2/10  assess for pain QS and prn- medicate prn and assess for relief, notify MD for unrelieved pain   Skin   RBKA with wound vac therapy   continue with wound vac therapy as ordered no sign symptoms of infection, no breakdown of skin  Assess wound vac/ wound and  skin QS and prn      *See Care Plan and progress notes for long and short-term goals.     Barriers to Discharge  Current Status/Progress Possible Resolutions Date Resolved   Physician    Medical stability;Wound Care;Weight bearing restrictions     See above  Therapies, d/c VAC, optimize DM meds, follow labs, bladder meds      Nursing                  PT                    OT Decreased caregiver support                SLP                SW Decreased caregiver support Does  not have 24 hr care intermittent assist only            Discharge Planning/Teaching Needs:  Home with intermittent assist from friends, needs to be mod/i to be able to go home safely      Team Discussion:  Goals mod/i wheelchair level, supervision with ambulation. DC vac today. Doing better with urinary retension meds increased for this-per MD. Vernie Shanks tomorrow. Off contact precautions now.   Revisions to Treatment Plan:  DC 4/9    Continued Need for Acute Rehabilitation Level of Care: The patient requires daily medical management by a physician with specialized training in physical medicine and rehabilitation for the following conditions: Daily direction of a multidisciplinary physical rehabilitation program to ensure safe treatment while eliciting the highest outcome that is of practical value to the patient.: Yes Daily medical management of patient stability for increased activity during participation in an intensive rehabilitation regime.: Yes Daily analysis of laboratory values and/or radiology reports with any subsequent need for medication adjustment of medical intervention for : Post surgical problems;Diabetes problems;Wound care problems;Urological problems  Elease Hashimoto 10/25/2017, 3:02 PM

## 2017-10-25 NOTE — Progress Notes (Signed)
Occupational Therapy Session Note  Patient Details  Name: VIVIAN OKELLEY MRN: [id number] Date of Birth: 08/03/1955  Today's Date: 10/25/2017 OT Individual Time: 6834-1962 OT Individual Time Calculation (min): 57 min    Short Term Goals: Week 1:  OT Short Term Goal 1 (Week 1): STGs equal to LTGs based on ELOS  Skilled Therapeutic Interventions/Progress Updates:    Pt completed 2 sets of 5 mins each on the UE ergonometer for UB strengthening during session.  Resistance set on level 10 with pt maintaining RPMs on 25-30.  Second set was completed for 5 mins as well with resistance increased to level 12.  Next had pt focus on standing balance with use of the RW for support while using the LUE to play the Wii.  Min guard assist to complete task in standing with rest break needed every 3-4 mins.  When attempting task in standing with use of the RUE he needed overall min to mod assist for balance, secondary to having to sustain balance without use of the RUE for support.  Completed several intervals in standing of task before returning to the room via wheelchair.  Pt left in wheelchair with call button and phone in reach.    Therapy Documentation Precautions:  Precautions Precautions: Fall Precaution Comments: questionable bed bugs Restrictions Weight Bearing Restrictions: Yes RLE Weight Bearing: Non weight bearing   Pain: Pain Assessment Pain Scale: Faces Pain Score: 0-No pain Faces Pain Scale: Hurts a little bit Pain Type: Acute pain Pain Location: Leg Pain Orientation: Right Pain Descriptors / Indicators: Discomfort Pain Frequency: Intermittent Pain Onset: On-going Patients Stated Pain Goal: 0 Pain Intervention(s): Emotional support;Repositioned ADL: See Function Navigator for Current Functional Status.   Therapy/Group: Individual Therapy  Daunte Oestreich OTR/L 10/25/2017, 3:44 PM

## 2017-10-25 NOTE — Progress Notes (Signed)
Subjective/Complaints: Patient seen sitting up in bed this morning. He states he slept well overnight. He states therapies are going well.  ROS  Denies CP, SOB, N/V/D.  Objective: Vital Signs: Blood pressure 103/63, pulse 93, temperature 98.5 F (36.9 C), temperature source Oral, resp. rate 18, height 6' (1.829 m), weight 103.1 kg (227 lb 4.7 oz), SpO2 96 %. No results found. Results for orders placed or performed during the hospital encounter of 10/20/17 (from the past 72 hour(s))  Glucose, capillary     Status: Abnormal   Collection Time: 10/22/17 11:28 AM  Result Value Ref Range   Glucose-Capillary 242 (H) 65 - 99 mg/dL  Urinalysis, Routine w reflex microscopic     Status: Abnormal   Collection Time: 10/22/17  1:59 PM  Result Value Ref Range   Color, Urine RED (A) YELLOW    Comment: BIOCHEMICALS MAY BE AFFECTED BY COLOR   APPearance CLOUDY (A) CLEAR   Specific Gravity, Urine 1.014 1.005 - 1.030   pH 9.0 (H) 5.0 - 8.0   Glucose, UA >=500 (A) NEGATIVE mg/dL   Hgb urine dipstick LARGE (A) NEGATIVE   Bilirubin Urine NEGATIVE NEGATIVE   Ketones, ur NEGATIVE NEGATIVE mg/dL   Protein, ur >300 (A) NEGATIVE mg/dL   Nitrite NEGATIVE NEGATIVE   Leukocytes, UA NEGATIVE NEGATIVE    Comment: Performed at [facility], [address] [place], [place] [address]  Urinalysis, Microscopic (reflex)     Status: None   Collection Time: 10/22/17  1:59 PM  Result Value Ref Range   RBC / HPF TOO NUMEROUS TO COUNT 0 - 5 RBC/hpf   WBC, UA TOO NUMEROUS TO COUNT 0 - 5 WBC/hpf   Bacteria, UA NONE SEEN NONE SEEN   Squamous Epithelial / LPF NONE SEEN NONE SEEN   WBC Casts, UA PRESENT     Comment: Performed at [facility], [place] [address] [place], [place] [address]  Glucose, capillary     Status: Abnormal   Collection Time: 10/22/17  4:57 PM  Result Value Ref Range   Glucose-Capillary 232 (H) 65 - 99 mg/dL  Glucose, capillary     Status: Abnormal   Collection Time: 10/22/17  9:46 PM   Result Value Ref Range   Glucose-Capillary 253 (H) 65 - 99 mg/dL  CBC     Status: Abnormal   Collection Time: 10/23/17  4:21 AM  Result Value Ref Range   WBC 13.0 (H) 4.0 - 10.5 K/uL   RBC 3.46 (L) 4.22 - 5.81 MIL/uL   Hemoglobin 10.2 (L) 13.0 - 17.0 g/dL   HCT 31.9 (L) 39.0 - 52.0 %   MCV 92.2 78.0 - 100.0 fL   MCH 29.5 26.0 - 34.0 pg   MCHC 32.0 30.0 - 36.0 g/dL   RDW 15.1 11.5 - 15.5 %   Platelets 482 (H) 150 - 400 K/uL    Comment: Performed at [facility], [place] [address] [place], [place] [address]  Glucose, capillary     Status: Abnormal   Collection Time: 10/23/17  6:14 AM  Result Value Ref Range   Glucose-Capillary 255 (H) 65 - 99 mg/dL  Glucose, capillary     Status: Abnormal   Collection Time: 10/23/17 11:50 AM  Result Value Ref Range   Glucose-Capillary 221 (H) 65 - 99 mg/dL   Comment 1 Notify RN   Glucose, capillary     Status: Abnormal   Collection Time: 10/23/17  5:10 PM  Result Value Ref Range  Glucose-Capillary 170 (H) 65 - 99 mg/dL   Comment 1 Notify RN   Glucose, capillary     Status: Abnormal   Collection Time: 10/23/17  9:25 PM  Result Value Ref Range   Glucose-Capillary 247 (H) 65 - 99 mg/dL  CBC     Status: Abnormal   Collection Time: 10/24/17  6:41 AM  Result Value Ref Range   WBC 13.3 (H) 4.0 - 10.5 K/uL   RBC 3.48 (L) 4.22 - 5.81 MIL/uL   Hemoglobin 10.0 (L) 13.0 - 17.0 g/dL   HCT 32.2 (L) 39.0 - 52.0 %   MCV 92.5 78.0 - 100.0 fL   MCH 28.7 26.0 - 34.0 pg   MCHC 31.1 30.0 - 36.0 g/dL   RDW 15.2 11.5 - 15.5 %   Platelets 529 (H) 150 - 400 K/uL    Comment: Performed at [facility], [place] [address], [place], [place] [address]  Basic metabolic panel     Status: Abnormal   Collection Time: 10/24/17  6:41 AM  Result Value Ref Range   Sodium 134 (L) 135 - 145 mmol/L   Potassium 4.3 3.5 - 5.1 mmol/L   Chloride 97 (L) 101 - 111 mmol/L   CO2 26 22 - 32 mmol/L   Glucose, Bld 162 (H) 65 - 99 mg/dL   BUN 24 (H) 6 - 20 mg/dL    Creatinine, Ser 0.83 0.61 - 1.24 mg/dL   Calcium 8.8 (L) 8.9 - 10.3 mg/dL   GFR calc non Af Amer >60 >60 mL/min   GFR calc Af Amer >60 >60 mL/min    Comment: (NOTE) The eGFR has been calculated using the CKD EPI equation. This calculation has not been validated in all clinical situations. eGFR's persistently <60 mL/min signify possible Chronic Kidney Disease.    Anion gap 11 5 - 15    Comment: Performed at [place] [address], [place], [place] [address]  Glucose, capillary     Status: Abnormal   Collection Time: 10/24/17  6:57 AM  Result Value Ref Range   Glucose-Capillary 141 (H) 65 - 99 mg/dL  Glucose, capillary     Status: Abnormal   Collection Time: 10/24/17 12:05 PM  Result Value Ref Range   Glucose-Capillary 140 (H) 65 - 99 mg/dL  Glucose, capillary     Status: Abnormal   Collection Time: 10/24/17  4:47 PM  Result Value Ref Range   Glucose-Capillary 159 (H) 65 - 99 mg/dL  Glucose, capillary     Status: Abnormal   Collection Time: 10/24/17  9:22 PM  Result Value Ref Range   Glucose-Capillary 253 (H) 65 - 99 mg/dL   Comment 1 Notify RN   Glucose, capillary     Status: Abnormal   Collection Time: 10/25/17  6:49 AM  Result Value Ref Range   Glucose-Capillary 193 (H) 65 - 99 mg/dL    Gen NAD. Well-developed. HEENT: Normocephalic, atraumatic.  Cardio: RRR and no JVD Resp: CTA B/L and unlabored GI: BS positive and ND Musc/Skel:  Right BKA TTP Neuro: Alert/Oriented Motor: 4/5 R HF (improving) Skin:   +VAC  Assessment/Plan: 1. Functional deficits secondary to Right BKA which require 3+ hours per day of interdisciplinary therapy in a comprehensive inpatient rehab setting. Physiatrist is providing close team supervision and 24 hour management of active medical problems listed below. Physiatrist and rehab team continue to assess barriers to discharge/monitor patient progress toward functional and medical goals. FIM: Function - Bathing Position: Sitting  EOB Body parts bathed by patient: Right arm, Left arm, Chest, Abdomen, Front perineal area, Buttocks, Right upper leg, Left upper leg, Left lower leg Body parts bathed by helper: Back Bathing not applicable: Right lower leg Assist Level: Supervision or verbal cues  Function- Upper Body Dressing/Undressing What is the patient wearing?: Pull over shirt/dress Pull over shirt/dress - Perfomed by patient: Thread/unthread right sleeve, Thread/unthread left sleeve, Put head through opening, Pull shirt over trunk Assist Level: Set up Function - Lower Body Dressing/Undressing What is the patient wearing?: Non-skid slipper socks, Socks, Pants Position: Sitting EOB Pants- Performed by patient: Thread/unthread right pants leg, Thread/unthread left pants leg Pants- Performed by helper: Pull pants up/down Non-skid slipper socks- Performed by patient: Don/doff left sock Assist for footwear: Supervision/touching assist Assist for lower body dressing: Supervision or verbal cues  Function - Toileting Toileting activity did not occur: No continent bowel/bladder event Toileting steps completed by patient: Performs perineal hygiene(no underwear) Toileting Assistive Devices: Grab bar or rail Assist level: Touching or steadying assistance (Pt.75%)  Function - Air cabin crew transfer activity did not occur: N/A Toilet transfer assistive device: Grab bar, Bedside commode Assist level to toilet: Touching or steadying assistance (Pt > 75%) Assist level from toilet: Touching or steadying assistance (Pt > 75%) Assist level to bedside commode (at bedside): Touching or steadying assistance (Pt > 75%) Assist level from bedside commode (at bedside): Touching or steadying assistance (Pt > 75%)  Function - Chair/bed transfer Chair/bed transfer method: Squat pivot Chair/bed transfer assist level: Touching or steadying assistance (Pt > 75%) Chair/bed transfer assistive device: Walker Chair/bed transfer  details: Verbal cues for technique, Verbal cues for precautions/safety, Verbal cues for safe use of DME/AE  Function - Locomotion: Wheelchair Will patient use wheelchair at discharge?: Yes Type: Manual Max wheelchair distance: 150 Assist Level: Touching or steadying assistance (Pt > 75%) Assist Level: Touching or steadying assistance (Pt > 75%) Assist Level: Touching or steadying assistance (Pt > 75%) Turns around,maneuvers to table,bed, and toilet,negotiates 3% grade,maneuvers on rugs and over doorsills: No Function - Locomotion: Ambulation Assistive device: Walker-rolling Max distance: 8 Assist level: Touching or steadying assistance (Pt > 75%) Walk 10 feet activity did not occur: Safety/medical concerns(fatigue) Walk 50 feet with 2 turns activity did not occur: Safety/medical concerns Walk 150 feet activity did not occur: Safety/medical concerns Walk 10 feet on uneven surfaces activity did not occur: Safety/medical concerns  Function - Comprehension Comprehension: Auditory Comprehension assist level: Understands complex 90% of the time/cues 10% of the time  Function - Expression Expression: Verbal Expression assist level: Expresses complex ideas: With extra time/assistive device  Function - Social Interaction Social Interaction assist level: Interacts appropriately with others with medication or extra time (anti-anxiety, antidepressant).  Function - Problem Solving Problem solving assist level: Solves basic problems with no assist  Function - Memory Memory assist level: Recognizes or recalls 90% of the time/requires cueing < 10% of the time Patient normally able to recall (first 3 days only): Current season, Location of own room, Staff names and faces, That he or she is in a hospital  Medical Problem List and Plan:  1. Functional and mobility deficits secondary to right BKA     Cont CIR 2. DVT Prophylaxis/Anticoagulation: Pharmaceutical: Lovenox  3. Pain Management:  Oxycodone prn effective  4. Mood: LCSW to follow for evaluation and support.  5. Neuropsych: This patient is capable of making decisions on his own behalf.  6. Skin/Wound Care: Routine pressure relief measures.    Will  DC VAC today 7. Fluids/Electrolytes/Nutrition: Monitor I/O.  8. T2DM: Monitor BS ac/hs continue    Lantus at bedtime with meal coverage CBG (last 3)  Recent Labs    10/24/17 1647 10/24/17 2122 10/25/17 0649  GLUCAP 159* 253* 193*    Increased Lantus to 20U on 3/31, increased to 24 units on 4/3   Novolog increased to 8U on 4/2 9. Mild hyponatremia:  Resolved   Na 137 on 3/30 10 ABLA:    Hb 10.0 on 4/2 11. CAD with chronic systolic CHF: Monitor daily weights and for other signs of overload.  Filed Weights   10/23/17 0448 10/24/17 0707 10/25/17 0453  Weight: 99.8 kg (220 lb 0.3 oz) 103.3 kg (227 lb 11.8 oz) 103.1 kg (227 lb 4.7 oz)  12.  Urinary retention   Flomax started, increased on 4/3    Foley d/ced 4/2, voiding trial 13. Leukocytosis   WBCs 13.3 on 4/2   Afebrile  Urine culture pending  Cont to monitor 14. Hypoalbuminemia   Supplement initiated  LOS (Days) 5 A FACE TO FACE EVALUATION WAS PERFORMED  Nahia Nissan Lorie Phenix 10/25/2017, 7:57 AM

## 2017-10-26 ENCOUNTER — Inpatient Hospital Stay (HOSPITAL_COMMUNITY): Payer: BLUE CROSS/BLUE SHIELD | Admitting: Physical Therapy

## 2017-10-26 ENCOUNTER — Inpatient Hospital Stay (HOSPITAL_COMMUNITY): Payer: BLUE CROSS/BLUE SHIELD | Admitting: Occupational Therapy

## 2017-10-26 ENCOUNTER — Encounter (HOSPITAL_COMMUNITY): Payer: BLUE CROSS/BLUE SHIELD | Admitting: *Deleted

## 2017-10-26 LAB — GLUCOSE, CAPILLARY
GLUCOSE-CAPILLARY: 185 mg/dL — AB (ref 65–99)
GLUCOSE-CAPILLARY: 327 mg/dL — AB (ref 65–99)
Glucose-Capillary: 136 mg/dL — ABNORMAL HIGH (ref 65–99)
Glucose-Capillary: 231 mg/dL — ABNORMAL HIGH (ref 65–99)

## 2017-10-26 MED ORDER — BETHANECHOL CHLORIDE 10 MG PO TABS
10.0000 mg | ORAL_TABLET | Freq: Three times a day (TID) | ORAL | Status: DC
  Administered 2017-10-26 (×3): 10 mg via ORAL
  Filled 2017-10-26 (×4): qty 1

## 2017-10-26 NOTE — Progress Notes (Signed)
Occupational Therapy Session Note  Patient Details  Name: HERNY SCURLOCK MRN: [id number] Date of Birth: 09-Jan-1956  Today's Date: 10/26/2017 OT Individual Time: 0930-1000 OT Individual Time Calculation (min): 30 min    Short Term Goals: Week 1:  OT Short Term Goal 1 (Week 1): STGs equal to LTGs based on ELOS Skilled Therapeutic Interventions/Progress Updates:    1:1 Focus on bed mobility; able to participate mod I. Pt donned new clean shirt with setup and donned new clean shoe with setup including tying it. Continued to discuss d/c planning. Pt reports needing to buy a new matress before d/c from hospital and someone is working on building a ramp- SW aware of current state of pt's d/c environment. Pt left to finish his breakfast.   Therapy Documentation Precautions:  Precautions Precautions: Fall Precaution Comments: questionable bed bugs Restrictions Weight Bearing Restrictions: Yes RLE Weight Bearing: Non weight bearing Pain:  no c/o pain  See Function Navigator for Current Functional Status.   Therapy/Group: Individual Therapy  Willeen Cass [facility] 10/26/2017, 3:56 PM

## 2017-10-26 NOTE — Progress Notes (Signed)
Recreational Therapy Assessment and Plan  Patient Details  Name: BURTON GAHAN MRN: [id number] Date of Birth: 1956-02-07 Today's Date: 10/26/2017  Rehab Potential: Good ELOS: 1 week Assessment  Problem List:      Patient Active Problem List   Diagnosis Date Noted  . Status post below knee amputation, right ([place]) 10/20/2017  . Unilateral complete BKA ([place]) 10/20/2017  . Sepsis ([place]) 10/12/2017  . Diabetic foot ulcer ([place]) 10/12/2017  . Diabetic foot infection ([place]) 10/12/2017  . Hyponatremia 10/12/2017  . Cellulitis of right foot 10/12/2017  . Chronic systolic CHF (congestive heart failure) ([place])   . Hyperkalemia 10/13/2015  . Congestive dilated cardiomyopathy ([place]) 07/13/2015  . Asymptomatic LV dysfunction 06/25/2015  . Poorly controlled diabetes mellitus ([place]) 05/31/2015  . HTN (hypertension), benign 07/04/2014  . Viral cardiomyopathy, EF improved to 45-50% 04/02/2013  . Obesity 04/02/2013  . Diabetic foot ulcer with osteomyelitis ([place]) 04/02/2013  . Hyperlipidemia 04/02/2013  . Sinus tachycardia 04/02/2013    Past Medical History:      Past Medical History:  Diagnosis Date  . Chronic systolic CHF (congestive heart failure) ([place])   . Diabetes mellitus without complication ([place])   . Hyperlipidemia   . Hypertension    Past Surgical History:       Past Surgical History:  Procedure Laterality Date  . AMPUTATION Right 10/14/2017   Procedure: AMPUTATION  GREAT TOE;  Surgeon: [name], MD;  Location: [facility];  Service: Orthopedics;  Laterality: Right;  . AMPUTATION Right 10/18/2017   Procedure: RIGHT BELOW KNEE AMPUTATION;  Surgeon: [name], MD;  Location: [facility];  Service: Orthopedics;  Laterality: Right;  . SHOULDER SURGERY  01/05    Assessment & Plan Clinical Impression: EDUARDO HONOR a 62 y.o.malewith history of T2DM, HTN, chronic systolic CHF who was admitted on 10/12/17 with infected foot ulcer with osteomyelitis. He was started on IV  antibiotics and underwent right transtibial amputation. He has had issues with bloody stools felt to be due to ASA and Sq heparin. H/H stable. Mild hyponateremia noted and reactive leucocytosis resolving. Pain has been reasonably controlled. Therapy evaluations done today and CIR recommended due to functional deficits. Patient transferred to CIR on 10/20/2017 .   Pt presents with decreased activity tolerance, decreased functional mobility, decreased balance Limiting pt's independence with leisure/community pursuits.  Plan Min 1 TR session for community reintegration >60 min during LOS  Recommendations for other services: None   Discharge Criteria: Patient will be discharged from TR if patient refuses treatment 3 consecutive times without medical reason.  If treatment goals not met, if there is a change in medical status, if patient makes no progress towards goals or if patient is discharged from hospital.  The above assessment, treatment plan, treatment alternatives and goals were discussed and mutually agreed upon: by patient  [place] 10/26/2017, 3:09 PM

## 2017-10-26 NOTE — Progress Notes (Signed)
Occupational Therapy Session Note  Patient Details  Name: [name] MRN: [id number] Date of Birth: 1955-12-07  Today's Date: 10/26/2017 OT Individual Time: 2297-9892 OT Individual Time Calculation (min): 46 min    Short Term Goals: Week 1:  OT Short Term Goal 1 (Week 1): STGs equal to LTGs based on ELOS  Skilled Therapeutic Interventions/Progress Updates:    Pt completed transfer to the 3:1 over the toilet with min assist, ambulating into the bathroom with the RW.  Pt with one LOB to the right when attempting to sit down on the [facility].  He was able to ambulate back out to the wheelchair with min assist as well.  He completed sit to stand and standing balance at the high low table for the second part of the session.  He completed standing for intervals of 10 mins with min assist while engaged in table top activity.  Finished session with return to the room with call button and phone in reach and pt up in the wheelchair.     Therapy Documentation Precautions:  Precautions Precautions: Fall Precaution Comments: questionable bed bugs Restrictions Weight Bearing Restrictions: Yes RLE Weight Bearing: Non weight bearing   Pain:   Pain less than 4/10 in the RLE  See Function Navigator for Current Functional Status.   Therapy/Group: Individual Therapy  Tyrelle Raczka OTR/L 10/26/2017, 4:15 PM

## 2017-10-26 NOTE — Progress Notes (Signed)
Physical Therapy Session Note  Patient Details  Name: Brian Herring MRN: [id number] Date of Birth: March 04, 1956  Today's Date: 10/26/2017 PT Individual Time: 1137-1202 PT Individual Time Calculation (min): 25 min   Short Term Goals: Week 1:  PT Short Term Goal 1 (Week 1): =LTG due to estimated LOS  Skilled Therapeutic Interventions/Progress Updates:  Pt received in bed & agreeable to tx, no c/o pain reported. Pt transfers to sitting EOB without assistance and transfers to w/c via squat pivot with supervision. Pt able to manage armrest, legrest & amputee support pad once sitting in w/c with extra time. Pt propels w/c room<>gift shop with supervision<>mod I and use of BUE. Therapist educated pt on need to back onto elevators and discussed w/c mobility in a community setting. Also educated pt on importance of maintaining R knee extension. At end of session pt left sitting in w/c in room with quick release belt & chair alarm donned, all needs within reach.    Therapy Documentation Precautions:  Precautions Precautions: Fall Precaution Comments: questionable bed bugs Restrictions Weight Bearing Restrictions: Yes RLE Weight Bearing: Non weight bearing   See Function Navigator for Current Functional Status.   Therapy/Group: Individual Therapy  Waunita Schooner 10/26/2017, 12:14 PM

## 2017-10-26 NOTE — Progress Notes (Addendum)
Recreational Therapy Session Note  Patient Details  Name: Brian Herring MRN: [id number] Date of Birth: November 19, 1955 Today's Date: 10/26/2017  Pain: no c/o Skilled Therapeutic Interventions/Progress Updates: Discussed community reintegration with pt including its purpose and potential goals.  Pt was agreeable to participate in an outing scheduled today.  Pt participated in community reintegration/outing to [employer] at overall supervision w/c level.  Goals focused on safe community mobility, identification & negotiation of obstacles, accessing public restroom, energy conservation techniques/education.  See outing goal sheet in shadow chart for full details.  Therapy/Group: Community Reintegration      Recreational Therapy Discharge Summary Patient Details  Name: Brian Herring MRN: [id number] Date of Birth: [birth year] Today's Date: 10/26/2017  Comments on progress toward goals: TR session focused on community reintegration w/c level. Pt participated at supervision w/c level.  Goals and education focused on safe community mobility, identification and negotiation of obstacles, public restroom access & energy conservation techniques.  Pt is set to discharge home 4/9 at supervision level for community pursuits.  Reasons for discharge: treatment goals met Patient/family agrees with progress made and goals achieved: Yes  [name] 10/26/2017, 3:12 PM

## 2017-10-26 NOTE — Progress Notes (Signed)
Nutrition Follow-up  DOCUMENTATION CODES:   Obesity unspecified  INTERVENTION:  Continue 30 ml Prostat po BID, each supplement provides 100 kcal and 15 grams of protein.   Continue 1 packet Juven BID, each packet provides 80 calories, 8 grams of carbohydrate, and 14 grams of amino acids; supplement contains CaHMB, glutamine, and arginine, to promote wound healing  Encourage adequate PO intake.   NUTRITION DIAGNOSIS:   Increased nutrient needs related to wound healing as evidenced by estimated needs; ongoing  GOAL:   Patient will meet greater than or equal to 90% of their needs; met  MONITOR:   PO intake, Supplement acceptance, Weight trends, Labs  REASON FOR ASSESSMENT:   Malnutrition Screening Tool    ASSESSMENT:   Patient with PMH significant for hypertension, hyperlipidemia, diabetes mellitus, sCHF with EF 25%, who presents with right foot ulcer with infection. Underwent right transtibial amputation and placement of Prevena wound VAC.   Meal completion has been 100%. Pt currently has Prostat and Juven ordered and has been consuming them. Intake has been adequate. RD to continue with current orders to aid in wound healing.    Labs and medications reviewed.   Diet Order:  Diet Carb Modified Fluid consistency: Thin; Room service appropriate? Yes  EDUCATION NEEDS:   Education needs have been addressed  Skin:  Skin Assessment: Skin Integrity Issues: Skin Integrity Issues:: Incisions Incisions: R leg with VAC  Last BM:  4/4  Height:   Ht Readings from Last 1 Encounters:  10/20/17 6' (1.829 m)    Weight:   Wt Readings from Last 1 Encounters:  10/26/17 224 lb 3.3 oz (101.7 kg)    Ideal Body Weight:  75.3 kg(adjusted right BKA)  BMI:  Body mass index is 30.41 kg/m.  Estimated Nutritional Needs:   Kcal:  2100-2300 kcal  Protein:  105-115 g  Fluid:  >2.1 L/day    Corrin Parker, MS, RD, LDN Pager # [phone] After hours/ weekend pager #  [phone]

## 2017-10-26 NOTE — Progress Notes (Signed)
Physical Therapy Session Note  Patient Details  Name: Brian Herring MRN: [id number] Date of Birth: Jan 31, 1956  Today's Date: 10/26/2017 PT Concurrent Time: 1300-1430 PT Concurrent Time Calculation (min): 90 min  Short Term Goals: Week 1:  PT Short Term Goal 1 (Week 1): =LTG due to estimated LOS  Skilled Therapeutic Interventions/Progress Updates: Pt participated in community outing with recreational therapist for focus on wheelchair mobility and access in community setting. Educated pt on energy conservation, activity tolerance, problem solving w/c access in crowded environments with barriers. Pt propelled w/c and accessed ordering counter with S. Performed squat pivot transfer to standard chair with min guard and min cues for w/c setup. Returned to room at end of session, all needs in reach.       Therapy Documentation Precautions:  Precautions Precautions: Fall Precaution Comments: questionable bed bugs Restrictions Weight Bearing Restrictions: Yes RLE Weight Bearing: Non weight bearing   See Function Navigator for Current Functional Status.   Therapy/Group: Concurrent therapy  Luberta Mutter 10/26/2017, 3:55 PM

## 2017-10-26 NOTE — Progress Notes (Signed)
Subjective/Complaints: Pt seen lying in bed this AM. He states he slept well overnight. He feels he is getting stronger. He asks for me to give him some water.  ROS  Denies CP, SOB, N/V/D.  Objective: Vital Signs: Blood pressure 100/63, pulse 86, temperature 98.4 F (36.9 C), temperature source Oral, resp. rate 16, height 6' (1.829 m), weight 101.7 kg (224 lb 3.3 oz), SpO2 98 %. No results found. Results for orders placed or performed during the hospital encounter of 10/20/17 (from the past 72 hour(s))  Glucose, capillary     Status: Abnormal   Collection Time: 10/23/17 11:50 AM  Result Value Ref Range   Glucose-Capillary 221 (H) 65 - 99 mg/dL   Comment 1 Notify RN   Glucose, capillary     Status: Abnormal   Collection Time: 10/23/17  5:10 PM  Result Value Ref Range   Glucose-Capillary 170 (H) 65 - 99 mg/dL   Comment 1 Notify RN   Glucose, capillary     Status: Abnormal   Collection Time: 10/23/17  9:25 PM  Result Value Ref Range   Glucose-Capillary 247 (H) 65 - 99 mg/dL  CBC     Status: Abnormal   Collection Time: 10/24/17  6:41 AM  Result Value Ref Range   WBC 13.3 (H) 4.0 - 10.5 K/uL   RBC 3.48 (L) 4.22 - 5.81 MIL/uL   Hemoglobin 10.0 (L) 13.0 - 17.0 g/dL   HCT 32.2 (L) 39.0 - 52.0 %   MCV 92.5 78.0 - 100.0 fL   MCH 28.7 26.0 - 34.0 pg   MCHC 31.1 30.0 - 36.0 g/dL   RDW 15.2 11.5 - 15.5 %   Platelets 529 (H) 150 - 400 K/uL    Comment: Performed at [facility], [address], [place], [place] [address]  Basic metabolic panel     Status: Abnormal   Collection Time: 10/24/17  6:41 AM  Result Value Ref Range   Sodium 134 (L) 135 - 145 mmol/L   Potassium 4.3 3.5 - 5.1 mmol/L   Chloride 97 (L) 101 - 111 mmol/L   CO2 26 22 - 32 mmol/L   Glucose, Bld 162 (H) 65 - 99 mg/dL   BUN 24 (H) 6 - 20 mg/dL   Creatinine, Ser 0.83 0.61 - 1.24 mg/dL   Calcium 8.8 (L) 8.9 - 10.3 mg/dL   GFR calc non Af Amer >60 >60 mL/min   GFR calc Af Amer >60 >60 mL/min    Comment:  (NOTE) The eGFR has been calculated using the CKD EPI equation. This calculation has not been validated in all clinical situations. eGFR's persistently <60 mL/min signify possible Chronic Kidney Disease.    Anion gap 11 5 - 15    Comment: Performed at [facility] [address], [place], [place] [address]  Glucose, capillary     Status: Abnormal   Collection Time: 10/24/17  6:57 AM  Result Value Ref Range   Glucose-Capillary 141 (H) 65 - 99 mg/dL  Glucose, capillary     Status: Abnormal   Collection Time: 10/24/17 12:05 PM  Result Value Ref Range   Glucose-Capillary 140 (H) 65 - 99 mg/dL  Urine Culture     Status: None   Collection Time: 10/24/17  1:00 PM  Result Value Ref Range   Specimen Description URINE, CATHETERIZED    Special Requests NONE    Culture      NO GROWTH Performed at [facility], [place]  [address] [place], [place] [address]    Report Status 10/25/2017 FINAL   Glucose, capillary     Status: Abnormal   Collection Time: 10/24/17  4:47 PM  Result Value Ref Range   Glucose-Capillary 159 (H) 65 - 99 mg/dL  Glucose, capillary     Status: Abnormal   Collection Time: 10/24/17  9:22 PM  Result Value Ref Range   Glucose-Capillary 253 (H) 65 - 99 mg/dL   Comment 1 Notify RN   Glucose, capillary     Status: Abnormal   Collection Time: 10/25/17  6:49 AM  Result Value Ref Range   Glucose-Capillary 193 (H) 65 - 99 mg/dL  Glucose, capillary     Status: Abnormal   Collection Time: 10/25/17 12:01 PM  Result Value Ref Range   Glucose-Capillary 121 (H) 65 - 99 mg/dL  Glucose, capillary     Status: Abnormal   Collection Time: 10/25/17  5:08 PM  Result Value Ref Range   Glucose-Capillary 113 (H) 65 - 99 mg/dL  Glucose, capillary     Status: Abnormal   Collection Time: 10/25/17  9:55 PM  Result Value Ref Range   Glucose-Capillary 191 (H) 65 - 99 mg/dL  Glucose, capillary     Status: Abnormal   Collection Time: 10/26/17  7:06 AM  Result Value Ref Range    Glucose-Capillary 136 (H) 65 - 99 mg/dL    Gen NAD. Well-developed. HEENT: Normocephalic, atraumatic.  Cardio: RRR and no JVD Resp: CTA B/L and unlabored GI: BS positive and ND Musc/Skel:  Right BKA TTP Neuro: Alert/Oriented Motor: 4+/5 R HF  Skin:   BKA c/d/i  Assessment/Plan: 1. Functional deficits secondary to Right BKA which require 3+ hours per day of interdisciplinary therapy in a comprehensive inpatient rehab setting. Physiatrist is providing close team supervision and 24 hour management of active medical problems listed below. Physiatrist and rehab team continue to assess barriers to discharge/monitor patient progress toward functional and medical goals. FIM: Function - Bathing Position: Sitting EOB Body parts bathed by patient: Right arm, Left arm, Chest, Abdomen, Front perineal area, Buttocks, Right upper leg, Left upper leg, Left lower leg Body parts bathed by helper: Back Bathing not applicable: Right lower leg Assist Level: Touching or steadying assistance(Pt > 75%)  Function- Upper Body Dressing/Undressing What is the patient wearing?: Pull over shirt/dress Pull over shirt/dress - Perfomed by patient: Thread/unthread right sleeve, Thread/unthread left sleeve, Put head through opening, Pull shirt over trunk Assist Level: Set up Function - Lower Body Dressing/Undressing What is the patient wearing?: Non-skid slipper socks, Socks, Pants Position: Sitting EOB Pants- Performed by patient: Thread/unthread right pants leg, Thread/unthread left pants leg Pants- Performed by helper: Pull pants up/down Non-skid slipper socks- Performed by patient: Don/doff right sock Assist for footwear: Supervision/touching assist Assist for lower body dressing: Touching or steadying assistance (Pt > 75%)  Function - Toileting Toileting activity did not occur: No continent bowel/bladder event Toileting steps completed by patient: Performs perineal hygiene(no underwear) Toileting Assistive  Devices: Grab bar or rail Assist level: Touching or steadying assistance (Pt.75%)  Function - Air cabin crew transfer activity did not occur: N/A Toilet transfer assistive device: Grab bar, Bedside commode Assist level to toilet: Touching or steadying assistance (Pt > 75%) Assist level from toilet: Touching or steadying assistance (Pt > 75%) Assist level to bedside commode (at bedside): Touching or steadying assistance (Pt > 75%) Assist level from bedside commode (at bedside): Touching or steadying assistance (Pt > 75%)  Function - Chair/bed transfer Chair/bed  transfer method: Lateral scoot Chair/bed transfer assist level: Supervision or verbal cues Chair/bed transfer assistive device: Walker, Armrests Chair/bed transfer details: Verbal cues for technique, Verbal cues for precautions/safety, Verbal cues for safe use of DME/AE  Function - Locomotion: Wheelchair Will patient use wheelchair at discharge?: Yes Type: Manual Max wheelchair distance: 150 Assist Level: No help, No cues, assistive device, takes more than reasonable amount of time Assist Level: Touching or steadying assistance (Pt > 75%) Assist Level: Touching or steadying assistance (Pt > 75%) Turns around,maneuvers to table,bed, and toilet,negotiates 3% grade,maneuvers on rugs and over doorsills: No Function - Locomotion: Ambulation Assistive device: Walker-rolling Max distance: 8 Assist level: Touching or steadying assistance (Pt > 75%) Walk 10 feet activity did not occur: Safety/medical concerns(fatigue) Walk 50 feet with 2 turns activity did not occur: Safety/medical concerns Walk 150 feet activity did not occur: Safety/medical concerns Walk 10 feet on uneven surfaces activity did not occur: Safety/medical concerns  Function - Comprehension Comprehension: Auditory Comprehension assist level: Follows basic conversation/direction with extra time/assistive device  Function - Expression Expression:  Verbal Expression assist level: Expresses complex ideas: With extra time/assistive device  Function - Social Interaction Social Interaction assist level: Interacts appropriately with others with medication or extra time (anti-anxiety, antidepressant).  Function - Problem Solving Problem solving assist level: Solves basic problems with no assist  Function - Memory Memory assist level: Recognizes or recalls 90% of the time/requires cueing < 10% of the time Patient normally able to recall (first 3 days only): Current season, Location of own room, Staff names and faces, That he or she is in a hospital  Medical Problem List and Plan:  1. Functional and mobility deficits secondary to right BKA     Cont CIR 2. DVT Prophylaxis/Anticoagulation: Pharmaceutical: Lovenox  3. Pain Management: Oxycodone prn effective  4. Mood: LCSW to follow for evaluation and support.  5. Neuropsych: This patient is capable of making decisions on his own behalf.  6. Skin/Wound Care: Routine pressure relief measures.  7. Fluids/Electrolytes/Nutrition: Monitor I/O.  8. T2DM: Monitor BS ac/hs continue    Lantus at bedtime with meal coverage CBG (last 3)  Recent Labs    10/25/17 1708 10/25/17 2155 10/26/17 0706  GLUCAP 113* 191* 136*    Increased Lantus to 20U on 3/31, increased to 24 units on 4/3   Novolog increased to 8U on 4/2   Labile on 4/4 9. Mild hyponatremia:     Na 134 on 4/2 10 ABLA:    Hb 10.0 on 4/2 11. CAD with chronic systolic CHF: Monitor daily weights and for other signs of overload.  Filed Weights   10/24/17 0707 10/25/17 0453 10/26/17 0505  Weight: 103.3 kg (227 lb 11.8 oz) 103.1 kg (227 lb 4.7 oz) 101.7 kg (224 lb 3.3 oz)  12.  Urinary retention   Flomax started, increased on 4/3    Foley d/ced 4/2   Bethanechol 25 3 times a day started on 4/4 13. Leukocytosis   WBCs 13.3 on 4/2   Afebrile  Urine culture NG  Cont to monitor 14. Hypoalbuminemia   Supplement initiated  LOS (Days)  6 A FACE TO FACE EVALUATION WAS PERFORMED  Yaquelin Langelier Lorie Phenix 10/26/2017, 8:28 AM

## 2017-10-27 ENCOUNTER — Inpatient Hospital Stay (HOSPITAL_COMMUNITY): Payer: BLUE CROSS/BLUE SHIELD

## 2017-10-27 ENCOUNTER — Inpatient Hospital Stay (HOSPITAL_COMMUNITY): Payer: BLUE CROSS/BLUE SHIELD | Admitting: Occupational Therapy

## 2017-10-27 ENCOUNTER — Inpatient Hospital Stay (HOSPITAL_COMMUNITY): Payer: BLUE CROSS/BLUE SHIELD | Admitting: Physical Therapy

## 2017-10-27 LAB — GLUCOSE, CAPILLARY
GLUCOSE-CAPILLARY: 150 mg/dL — AB (ref 65–99)
Glucose-Capillary: 114 mg/dL — ABNORMAL HIGH (ref 65–99)
Glucose-Capillary: 196 mg/dL — ABNORMAL HIGH (ref 65–99)
Glucose-Capillary: 205 mg/dL — ABNORMAL HIGH (ref 65–99)

## 2017-10-27 MED ORDER — BETHANECHOL CHLORIDE 10 MG PO TABS
10.0000 mg | ORAL_TABLET | Freq: Four times a day (QID) | ORAL | Status: DC
  Administered 2017-10-27 – 2017-10-29 (×11): 10 mg via ORAL
  Filled 2017-10-27 (×11): qty 1

## 2017-10-27 MED ORDER — INSULIN ASPART 100 UNIT/ML ~~LOC~~ SOLN
9.0000 [IU] | Freq: Three times a day (TID) | SUBCUTANEOUS | Status: DC
  Administered 2017-10-27 – 2017-10-31 (×12): 9 [IU] via SUBCUTANEOUS

## 2017-10-27 NOTE — Progress Notes (Signed)
Occupational Therapy Session Note  Patient Details  Name: Brian Herring MRN: [id number] Date of Birth: 1955/11/14  Today's Date: 10/27/2017 OT Individual Time: 5329-9242 OT Individual Time Calculation (min): 67 min    Short Term Goals: Week 1:  OT Short Term Goal 1 (Week 1): STGs equal to LTGs based on ELOS  Skilled Therapeutic Interventions/Progress Updates:    Pt received supine in bed with no c/o pain and agreeable to therapy. Pt completed bed mobility and transfer into w/c with set up. Pt brought down to laundry room and completed 2x sit to stand transfers with functional reaching in standing to move clothes from washer into dryer, requiring occasional vc for positioning and for activity pacing. Pt requested to "get some fresh air". Pt was signed off unit and he performed 357ft of self-propulsion throughout hospital to elevator. Education provided throughout re rest breaks, obstacle navigation, and positioning w/c in elevator. Pt sat outside, propelling w/c over 2 in curb with vc for technique and engaged in therapeutic talk re d/c planning, return to driving, community re-integration, IADL completion, and self-reflection. Pt then propelled w/c 528ft total to return to department, requiring increased time for task completion and occasional vc for rest breaks. Pt left sitting up in w/c with chair alarm activated and quick release belt donned.   Therapy Documentation Precautions:  Precautions Precautions: Fall Precaution Comments: questionable bed bugs Required Braces or Orthoses: Knee Immobilizer - Left Knee Immobilizer - Left: Other (comment)(limb guard) Restrictions Weight Bearing Restrictions: Yes RLE Weight Bearing: Non weight bearing   Pain: Pain Assessment Pain Scale: 0-10 Pain Score: 0-No pain Faces Pain Scale: No hurt Pain Type: Acute pain Pain Location: Leg Pain Orientation: Right Pain Descriptors / Indicators: Discomfort Pain Onset: On-going Pain Intervention(s):  Repositioned Multiple Pain Sites: No  See Function Navigator for Current Functional Status.   Therapy/Group: Individual Therapy  Curtis Sites 10/27/2017, 12:04 PM

## 2017-10-27 NOTE — Progress Notes (Signed)
Physical Therapy Weekly Progress Note  Patient Details  Name: Brian Herring MRN: [id number] Date of Birth: 1955/10/13  Beginning of progress report period: October 21, 2017 End of progress report period: October 27, 2017  Today's Date: 10/27/2017 PT Individual Time: 0700-0800 PT Individual Time Calculation (min): 60 min   Patient has met 5 of 10 long term goals; long term goals not set d/t estimated LOS. Pt currently performs bed mobility modI, transfers w/c <>bed and car with S, gait x10' with RW and minA, stairs 3" height with minA and B handrails. Patient is limited by pain, decreased aerobic endurance and generalized deconditioning. Pt reports he is working on a ramp for home entry, but would like to keep practicing stairs in the event a ramp is not ready by d/c. Therapist facilitating ongoing education and promoting independence with w/c management and safety, residual limb/intact limb care, ongoing lifestyle wellness choices to promote limb healing and prepare for prosthetic fit/training.  Patient continues to demonstrate the following deficits muscle weakness and muscle joint tightness, decreased cardiorespiratoy endurance and decreased standing balance, decreased postural control and decreased balance strategies and therefore will continue to benefit from skilled PT intervention to increase functional independence with mobility.  Patient progressing toward long term goals..  Continue plan of care.  PT Short Term Goals Week 1:  PT Short Term Goal 1 (Week 1): =LTG due to estimated LOS Week 2:  PT Short Term Goal 1 (Week 2): =LTG due to estimated LOS  Skilled Therapeutic Interventions/Progress Updates: Pt received supine in bed, reports wound vac recently removed and reports pain 3/10 following removal. Instructed pt in donning of limb guard RLE; requires minA d/t inability to reach distal end to ensure wide strap is snug. Sit <>stand with RW from EOB with min guard to attempt voiding; unable  at this time. Therapist and pt inspected L foot with mirror and discussed importance of protecting LLE, wearing shoe during the day due to high amounts of stress on LLE, and daily skin checks. Pt dons sock, shoe LLE with setupA. Squat pivot transfer to w/c with S. W/c propulsion 2x150' with BUE modI during session. Performed ascent/descent 8 steps 3" height with B handrails and minA. Therapist provided demonstration and cueing for scapular depression and lift/lower as opposed to "hopping" up stairs for safety to prevent fall and LLE skin preservation to reduce weight bearing impact. Performed 2 sets 10 reps scapular depression in parallel bars with mod/max cues for technique; pt with difficulty motor planning scapular depression/elevation both in sitting non-weight bearing as well as in standing. Transfer to mat with S squat pivot. Pt repositioned RLE limbguard with min cues for technique to ensure proper fit. Supine core strengthening exercises 2x15 reps including crunches, lateral crunch heel slides, seated russian twist with 2kg weighted ball. Returned to room modI w/c propulsion at end of session, all needs in reach.      Therapy Documentation Precautions:  Precautions Precautions: Fall Precaution Comments: questionable bed bugs Restrictions Weight Bearing Restrictions: Yes RLE Weight Bearing: Non weight bearing   See Function Navigator for Current Functional Status.  Therapy/Group: Individual Therapy  Luberta Mutter 10/27/2017, 7:59 AM

## 2017-10-27 NOTE — Progress Notes (Signed)
Subjective/Complaints:  Had first shower, no pain meds yesterday Still requiring I/O caths  ROS  Denies CP, SOB, N/V/D.  Objective: Vital Signs: Blood pressure 112/77, pulse 82, temperature 98 F (36.7 C), temperature source Oral, resp. rate 18, height 6' (1.829 m), weight 101.7 kg (224 lb 3.3 oz), SpO2 97 %. No results found. Results for orders placed or performed during the hospital encounter of 10/20/17 (from the past 72 hour(s))  Glucose, capillary     Status: Abnormal   Collection Time: 10/24/17 12:05 PM  Result Value Ref Range   Glucose-Capillary 140 (H) 65 - 99 mg/dL  Urine Culture     Status: None   Collection Time: 10/24/17  1:00 PM  Result Value Ref Range   Specimen Description URINE, CATHETERIZED    Special Requests NONE    Culture      NO GROWTH Performed at [facility] [address], [place], [place] [address]    Report Status 10/25/2017 FINAL   Glucose, capillary     Status: Abnormal   Collection Time: 10/24/17  4:47 PM  Result Value Ref Range   Glucose-Capillary 159 (H) 65 - 99 mg/dL  Glucose, capillary     Status: Abnormal   Collection Time: 10/24/17  9:22 PM  Result Value Ref Range   Glucose-Capillary 253 (H) 65 - 99 mg/dL   Comment 1 Notify RN   Glucose, capillary     Status: Abnormal   Collection Time: 10/25/17  6:49 AM  Result Value Ref Range   Glucose-Capillary 193 (H) 65 - 99 mg/dL  Glucose, capillary     Status: Abnormal   Collection Time: 10/25/17 12:01 PM  Result Value Ref Range   Glucose-Capillary 121 (H) 65 - 99 mg/dL  Glucose, capillary     Status: Abnormal   Collection Time: 10/25/17  5:08 PM  Result Value Ref Range   Glucose-Capillary 113 (H) 65 - 99 mg/dL  Glucose, capillary     Status: Abnormal   Collection Time: 10/25/17  9:55 PM  Result Value Ref Range   Glucose-Capillary 191 (H) 65 - 99 mg/dL  Glucose, capillary     Status: Abnormal   Collection Time: 10/26/17  7:06 AM  Result Value Ref Range   Glucose-Capillary 136  (H) 65 - 99 mg/dL  Glucose, capillary     Status: Abnormal   Collection Time: 10/26/17 11:06 AM  Result Value Ref Range   Glucose-Capillary 185 (H) 65 - 99 mg/dL  Glucose, capillary     Status: Abnormal   Collection Time: 10/26/17  4:37 PM  Result Value Ref Range   Glucose-Capillary 327 (H) 65 - 99 mg/dL   Comment 1 Notify RN   Glucose, capillary     Status: Abnormal   Collection Time: 10/26/17  9:05 PM  Result Value Ref Range   Glucose-Capillary 231 (H) 65 - 99 mg/dL   Comment 1 Notify RN   Glucose, capillary     Status: Abnormal   Collection Time: 10/27/17  6:57 AM  Result Value Ref Range   Glucose-Capillary 114 (H) 65 - 99 mg/dL    Gen NAD. Well-developed. HEENT: Normocephalic, atraumatic.  Cardio: RRR and no JVD Resp: CTA B/L and unlabored GI: BS positive and ND Musc/Skel:  Right BKA TTP Neuro: Alert/Oriented Motor: 4+/5 R HF  Skin:   BKA c/d/i  Assessment/Plan: 1. Functional deficits secondary to Right BKA which require 3+ hours per day of interdisciplinary therapy in a comprehensive inpatient rehab setting. Physiatrist is providing  close team supervision and 24 hour management of active medical problems listed below. Physiatrist and rehab team continue to assess barriers to discharge/monitor patient progress toward functional and medical goals. FIM: Function - Bathing Position: Sitting EOB Body parts bathed by patient: Right arm, Left arm, Chest, Abdomen, Front perineal area, Buttocks, Right upper leg, Left upper leg, Left lower leg Body parts bathed by helper: Back Bathing not applicable: Right lower leg Assist Level: Touching or steadying assistance(Pt > 75%)  Function- Upper Body Dressing/Undressing What is the patient wearing?: Pull over shirt/dress Pull over shirt/dress - Perfomed by patient: Thread/unthread right sleeve, Thread/unthread left sleeve, Put head through opening, Pull shirt over trunk Assist Level: Set up Function - Lower Body  Dressing/Undressing What is the patient wearing?: Pants Position: Bed Pants- Performed by patient: Thread/unthread right pants leg, Thread/unthread left pants leg Pants- Performed by helper: Pull pants up/down Non-skid slipper socks- Performed by patient: Don/doff right sock Assist for footwear: Supervision/touching assist Assist for lower body dressing: Touching or steadying assistance (Pt > 75%)  Function - Toileting Toileting activity did not occur: No continent bowel/bladder event Toileting steps completed by patient: Performs perineal hygiene(no underwear) Toileting steps completed by helper: Adjust clothing prior to toileting, Performs perineal hygiene, Adjust clothing after toileting Toileting Assistive Devices: Grab bar or rail Assist level: Touching or steadying assistance (Pt.75%)  Function - Air cabin crew transfer activity did not occur: N/A Toilet transfer assistive device: Grab bar, Bedside commode Assist level to toilet: Touching or steadying assistance (Pt > 75%) Assist level from toilet: Touching or steadying assistance (Pt > 75%) Assist level to bedside commode (at bedside): Touching or steadying assistance (Pt > 75%) Assist level from bedside commode (at bedside): Touching or steadying assistance (Pt > 75%)  Function - Chair/bed transfer Chair/bed transfer method: Squat pivot Chair/bed transfer assist level: Supervision or verbal cues Chair/bed transfer assistive device: Armrests Chair/bed transfer details: Verbal cues for technique, Verbal cues for precautions/safety, Verbal cues for safe use of DME/AE  Function - Locomotion: Wheelchair Will patient use wheelchair at discharge?: Yes Type: Manual Max wheelchair distance: 150 Assist Level: No help, No cues, assistive device, takes more than reasonable amount of time Assist Level: No help, No cues, assistive device, takes more than reasonable amount of time Assist Level: No help, No cues, assistive  device, takes more than reasonable amount of time Turns around,maneuvers to table,bed, and toilet,negotiates 3% grade,maneuvers on rugs and over doorsills: No Function - Locomotion: Ambulation Assistive device: Walker-rolling Max distance: 8 Assist level: Touching or steadying assistance (Pt > 75%) Walk 10 feet activity did not occur: Safety/medical concerns(fatigue) Walk 50 feet with 2 turns activity did not occur: Safety/medical concerns Walk 150 feet activity did not occur: Safety/medical concerns Walk 10 feet on uneven surfaces activity did not occur: Safety/medical concerns  Function - Comprehension Comprehension: Auditory Comprehension assist level: Follows basic conversation/direction with no assist  Function - Expression Expression: Verbal Expression assist level: Expresses complex ideas: With no assist  Function - Social Interaction Social Interaction assist level: Interacts appropriately with others with medication or extra time (anti-anxiety, antidepressant).  Function - Problem Solving Problem solving assist level: Solves basic problems with no assist  Function - Memory Memory assist level: Recognizes or recalls 90% of the time/requires cueing < 10% of the time Patient normally able to recall (first 3 days only): Current season, Location of own room, Staff names and faces, That he or she is in a hospital  Medical Problem List and Plan:  1. Functional and mobility deficits secondary to right BKA     Cont CIR PT, OT 2. DVT Prophylaxis/Anticoagulation: Pharmaceutical: Lovenox  3. Pain Management: Oxycodone prn effective  4. Mood: LCSW to follow for evaluation and support.  5. Neuropsych: This patient is capable of making decisions on his own behalf.  6. Skin/Wound Care: Routine pressure relief measures.  7. Fluids/Electrolytes/Nutrition: Monitor I/O.  8. T2DM: Monitor BS ac/hs continue    Lantus at bedtime with meal coverage CBG (last 3)  Recent Labs     10/26/17 1637 10/26/17 2105 10/27/17 0657  GLUCAP 327* 231* 114*    Increased Lantus to 20U on 3/31, increased to 24 units on 4/3, am CBG ok   Novolog increased to 8U on 4/2, will increase to 9 U at meals   9. Mild hyponatremia:     Na 134 on 4/2 10 ABLA:    Hb 10.0 on 4/2 11. CAD with chronic systolic CHF: Monitor daily weights and for other signs of overload.  Filed Weights   10/24/17 0707 10/25/17 0453 10/26/17 0505  Weight: 103.3 kg (227 lb 11.8 oz) 103.1 kg (227 lb 4.7 oz) 101.7 kg (224 lb 3.3 oz)  12.  Urinary retention   Flomax started, increased on 4/3    Foley d/ced 4/2   Bethanechol 10mg  3 times a day started on 4/4, increase to QID 13. Leukocytosis   WBCs 13.3 on 4/2   Afebrile  Urine culture NG  Cont to monitor 14. Hypoalbuminemia   Supplement initiated  LOS (Days) 7 A FACE TO FACE EVALUATION WAS PERFORMED  Charlett Blake 10/27/2017, 8:12 AM

## 2017-10-27 NOTE — Progress Notes (Signed)
Occupational Therapy Weekly Progress Note  Patient Details  Name: Brian Herring MRN: [id number] Date of Birth: 1956/05/09  Beginning of progress report period: October 21, 2017 End of progress report period: October 27, 2017  Today's Date: 10/27/2017 OT Individual Time: 0800-0900 OT Individual Time Calculation (min): 60 min   Brian Herring is making steady progress with OT at this time.  He currently completes UB selfcare at a supervision level with LB selfcare sit to stand at a min guard assist level.  He can also complete LB selfcare in sitting with lateral leans at a supervision level as well.  Transfers are currently min guard assist for stand pivot to the tub bench and the 3:1 with supervision for squat pivot transfers without an assistive device.  Feel he is making great progress with therapy and anticipate he will reach modified independent level with expected discharge 10/31/17.  Will continue with current OT POC until expected discharge.    Patient continues to demonstrate the following deficits: muscle weakness and decreased standing balance and decreased balance strategies and therefore will continue to benefit from skilled OT intervention to enhance overall performance with BADL, iADL and Reduce care partner burden.  Patient progressing toward long term goals..  Continue plan of care.  OT Short Term Goals Week 2:   Continue working on established STGs set a modified independent  Skilled Therapeutic Interventions/Progress Updates:    Pt completed stand pivot to and from the shower with min guard assist using the RW.  He was able to complete all bathing in sitting with supervision and lateral leans.  Dressing was performed sit to stand from the wheelchair with use of the RW for support at min guard assist.  Finished session with wheelchair mobility to the laundry room and pt completing sit to stand to place clothing in the washing machine.  Finished session with return to the room with call  button and phone in reach.    Therapy Documentation Precautions:  Precautions Precautions: Fall Precaution Comments: questionable bed bugs Required Braces or Orthoses: Knee Immobilizer - Left Knee Immobilizer - Left: Other (comment)(limb guard) Restrictions Weight Bearing Restrictions: Yes RLE Weight Bearing: Non weight bearing   Pain: Pain Assessment Pain Scale: 0-10 Pain Score: 3  Pain Type: Surgical pain Pain Location: Leg Pain Orientation: Right Pain Descriptors / Indicators: Discomfort Pain Onset: On-going Pain Intervention(s): Repositioned Multiple Pain Sites: No ADL: See Function Navigator for Current Functional Status.   Therapy/Group: Individual Therapy  Lakeeta Dobosz OTR/L 10/27/2017, 8:59 AM

## 2017-10-28 ENCOUNTER — Inpatient Hospital Stay (HOSPITAL_COMMUNITY): Payer: BLUE CROSS/BLUE SHIELD

## 2017-10-28 ENCOUNTER — Inpatient Hospital Stay (HOSPITAL_COMMUNITY): Payer: BLUE CROSS/BLUE SHIELD | Admitting: Physical Therapy

## 2017-10-28 LAB — GLUCOSE, CAPILLARY
GLUCOSE-CAPILLARY: 99 mg/dL (ref 65–99)
GLUCOSE-CAPILLARY: 146 mg/dL — AB (ref 65–99)
Glucose-Capillary: 137 mg/dL — ABNORMAL HIGH (ref 65–99)
Glucose-Capillary: 175 mg/dL — ABNORMAL HIGH (ref 65–99)

## 2017-10-28 NOTE — Progress Notes (Signed)
Physical Therapy Note  Patient Details  Name: Brian Herring MRN: [id number] Date of Birth: [birth year] Today's Date: 10/28/2017  tx 1:  0945-1020, 35 min individual tx Pain: 4/10 R residual limb, declined meds  Bed mobility in flat bed with supervision, no rails.  Lateral scoot transfer with supervision bed> w/c to L and R.  W/c propulsion x 150' with supervision. Pt managed bil leg rests with extra time and hand over hand assistance at times.  10 x 1 scapular depression press-ups with Yoga blocks in sitting, with cues to lean forward and use UEs more than LLE.  Up/down (3) 3" high steps bil rails, ascending backwards, descending forwards, mod assist. Gait training with RW on level tile x 10' with min guard assist.  Standing with RW, 10 x 1 R hip extension and sitting R knee short arc extension with 2 second hold at end range, for RLE strengthening.  Pt left resting in bed with alarm set and needs at hand.  tx 2:  1515-1524, 9 min individual tx Pain: " a little soreness", unrated, declined meds  Supine therapeutic exercises performed with LEs to increase strength for functional mobility: 2 x 10 cervical flexion, L straight leg raises, R short arc quad knee extension with isometric hold at end range, bil glut sets.  Lying in bed, pt passed to next therapist.   See function navigator for current status.   Meagen Limones 10/28/2017, 10:08 AM

## 2017-10-28 NOTE — Progress Notes (Signed)
Brian Herring is a 62 y.o. male [birth year] [id number]  Subjective: Reports continued problems with urinary retention.  Denies constipation and unsure if related to decreased oral intake.  Notes pain at stump on lateral edge.  Otherwise without complaints or concern.  Objective: Vital signs in last 24 hours: Temp:  [97.9 F (36.6 C)-98.7 F (37.1 C)] 97.9 F (36.6 C) (04/06 0353) Pulse Rate:  [81-96] 81 (04/06 0353) Resp:  [16-18] 16 (04/06 0353) BP: (97-127)/(63-68) 97/63 (04/06 0353) SpO2:  [95 %-96 %] 95 % (04/06 0353) Weight:  [105.3 kg (232 lb 3.2 oz)] 105.3 kg (232 lb 3.2 oz) (04/06 0500) Weight change:  Last BM Date: 10/26/17  Intake/Output from previous day: 04/05 0701 - 04/06 0700 In: 600 [P.O.:600] Out: 4530 [Urine:4530]  Physical Exam General: No apparent distress   RN in room at my visit, performing stump dressing change Lungs: Normal effort. Lungs clear to auscultation, no crackles or wheezes. Cardiovascular: Regular rate and rhythm, no edema Wounds: R BKA dressing clean/dry and intact.  Staples with well approximated edges and no evidence of bleeding or infection.  Specifically lateral edge where patient notes pain appears benign  Lab Results: BMET    Component Value Date/Time   NA 134 (L) 10/24/2017 0641   K 4.3 10/24/2017 0641   CL 97 (L) 10/24/2017 0641   CO2 26 10/24/2017 0641   GLUCOSE 162 (H) 10/24/2017 0641   BUN 24 (H) 10/24/2017 0641   CREATININE 0.83 10/24/2017 0641   CREATININE 1.06 10/12/2015 1557   CALCIUM 8.8 (L) 10/24/2017 0641   GFRNONAA >60 10/24/2017 0641   GFRAA >60 10/24/2017 0641   CBC    Component Value Date/Time   WBC 13.3 (H) 10/24/2017 0641   RBC 3.48 (L) 10/24/2017 0641   HGB 10.0 (L) 10/24/2017 0641   HCT 32.2 (L) 10/24/2017 0641   PLT 529 (H) 10/24/2017 0641   MCV 92.5 10/24/2017 0641   MCH 28.7 10/24/2017 0641   MCHC 31.1 10/24/2017 0641   RDW 15.2 10/24/2017 0641   LYMPHSABS 3.1 10/21/2017 0633   MONOABS 0.5  10/21/2017 0633   EOSABS 0.1 10/21/2017 0633   BASOSABS 0.0 10/21/2017 0633   CBG's (last 3):   Recent Labs    10/27/17 2102 10/28/17 0617 10/28/17 1202  GLUCAP 150* 99 146*   LFT's Lab Results  Component Value Date   ALT 34 10/21/2017   AST 33 10/21/2017   ALKPHOS 89 10/21/2017   BILITOT 0.5 10/21/2017    Studies/Results: No results found.  Medications:  I have reviewed the patient's current medications. Scheduled Medications: . atorvastatin  80 mg Oral Daily  . bethanechol  10 mg Oral QID  . docusate sodium  100 mg Oral BID  . enoxaparin (LOVENOX) injection  40 mg Subcutaneous Q24H  . feeding supplement (PRO-STAT SUGAR FREE 64)  30 mL Oral BID  . insulin aspart  0-5 Units Subcutaneous QHS  . insulin aspart  0-9 Units Subcutaneous TID WC  . insulin aspart  9 Units Subcutaneous TID WC  . insulin glargine  24 Units Subcutaneous QHS  . loratadine  10 mg Oral Daily  . metoprolol succinate  25 mg Oral QPM  . nutrition supplement (JUVEN)  1 packet Oral BID BM  . saccharomyces boulardii  250 mg Oral BID  . sacubitril-valsartan  1 tablet Oral BID  . tamsulosin  0.8 mg Oral QPC supper   PRN Medications: acetaminophen, alum & mag hydroxide-simeth, bisacodyl, diphenhydrAMINE, guaiFENesin-dextromethorphan, lidocaine, methocarbamol, oxyCODONE, polyethylene glycol,  prochlorperazine **OR** prochlorperazine **OR** prochlorperazine, sodium chloride, sodium phosphate, traZODone  Assessment/Plan: Principal Problem:   Unilateral complete BKA (HCC) Active Problems:   Diabetic foot ulcer with osteomyelitis ([place])   Poorly controlled diabetes mellitus (HCC)   Chronic systolic CHF (congestive heart failure) (HCC)   Status post below knee amputation, right (HCC)   Hypoalbuminemia due to protein-calorie malnutrition ([place])   Urinary retention   Acute blood loss anemia   Length of stay, days: 8  1.  Right BKA March 17 related to unhealed prior diabetic foot ulcer with osteomyelitis.   Continue dressing changes wound care and ongoing CIR therapy to include PT and OT as ordered.  No changes recommended. 2.  Urinary retention.  Foley discontinued April 2 and continued upward titration of methacholine and Flomax without significant change in symptoms.  We will continue to monitor INO and support oral intake with conscious effort.  Minimize narcotics as ongoing and consider reinsertion of Foley as needed according to INR with residual volume. 3. type 2 diabetes, uncontrolled prior to admission.  Continue current therapies as ordered with insulin and sliding scale.  Titrate upward as needed for goal range of CBGs to be less than 150 4.  Hypertension.  Continue current therapies and monitor BP as ongoing.  Mayreli Alden A. Asa Lente, MD 10/28/2017, 12:43 PM

## 2017-10-28 NOTE — Progress Notes (Signed)
Physical Therapy Session Note  Patient Details  Name: Brian Herring MRN: [id number] Date of Birth: [birth year]  Today's Date: 10/28/2017 PT Individual Time: 7158-0638 PT Individual Time Calculation (min): 42 min   Short Term Goals: Week 1:  PT Short Term Goal 1 (Week 1): =LTG due to estimated LOS Week 2:  PT Short Term Goal 1 (Week 2): =LTG due to estimated LOS  Skilled Therapeutic Interventions/Progress Updates:   Pt received supine in bed and agreeable to PT. Supine>sit transfer with supervision assist and min cues for limb protection.   Squat pivot transfer to [facility] with supervision assist and min verbal instruction from PT for proper UE placement to improve safety.   WC mobility to main entrance of hospital 565f+ with supervision assist. Additional WC mobility over unlevel cement side walk 1556fx 2 with supervision assist and min cues for improved anterior weight shift to prevent posterior LOB on slight hill.   Pt transported to rehab gym. Stair training x4 with posterior ascent technique with BUEsupport on Rails and min assist from PT. Moderate multimodal cues for swing to gait pattern and increased use of UE to prevent fall risk from "hopping".   [facility] to [facility] and [facility] and all needs met.         Therapy Documentation Precautions:  Precautions Precautions: Fall Precaution Comments: questionable bed bugs Required Braces or Orthoses: Knee Immobilizer - Left Knee Immobilizer - Left: Other (comment)(limb guard) Restrictions Weight Bearing Restrictions: Yes RLE Weight Bearing: Non weight bearing Vital Signs: Therapy Vitals Temp: 98.4 F (36.9 C) Temp Source: Oral Pulse Rate: 89 Resp: 16 BP: 116/73 Patient Position (if appropriate): Lying Oxygen Therapy SpO2: 97 % O2 Device: Room Air Pain: Pain Assessment Pain Score: 2    See Function Navigator for Current Functional Status.   Therapy/Group: Individual  Therapy  AuLorie Phenix/12/2017, 4:06 PM

## 2017-10-29 LAB — GLUCOSE, CAPILLARY
GLUCOSE-CAPILLARY: 113 mg/dL — AB (ref 65–99)
GLUCOSE-CAPILLARY: 225 mg/dL — AB (ref 65–99)
Glucose-Capillary: 144 mg/dL — ABNORMAL HIGH (ref 65–99)
Glucose-Capillary: 188 mg/dL — ABNORMAL HIGH (ref 65–99)

## 2017-10-29 NOTE — Progress Notes (Signed)
Brian Herring is a 62 y.o. male 1956/05/20 [id number]  Subjective: No new complaints. Continued ur retention but optimistic as feels "urge" sensation return.. Also requests grounds pass.  Objective: Vital signs in last 24 hours: Temp:  [97.9 F (36.6 C)-98.4 F (36.9 C)] 97.9 F (36.6 C) (04/07 0500) Pulse Rate:  [89-90] 90 (04/07 0500) Resp:  [16-17] 17 (04/07 0500) BP: (109-116)/(66-73) 109/66 (04/07 0500) SpO2:  [96 %-97 %] 96 % (04/07 0500) Weight:  [102.2 kg (225 lb 5 oz)] 102.2 kg (225 lb 5 oz) (04/07 0500) Weight change: -3.125 kg (-6 lb 14.2 oz) Last BM Date: 10/29/17  Intake/Output from previous day: 04/06 0701 - 04/07 0700 In: 720 [P.O.:720] Out: 2100 [Urine:2100]  Physical Exam General: No apparent distress    Lungs: Normal effort. Lungs clear to auscultation, no crackles or wheezes. Cardiovascular: Regular rate and rhythm, no edema Musculoskeletal:  R BKA Wounds: dressing is clean, dry, intact.  Lab Results: BMET    Component Value Date/Time   NA 134 (L) 10/24/2017 0641   K 4.3 10/24/2017 0641   CL 97 (L) 10/24/2017 0641   CO2 26 10/24/2017 0641   GLUCOSE 162 (H) 10/24/2017 0641   BUN 24 (H) 10/24/2017 0641   CREATININE 0.83 10/24/2017 0641   CREATININE 1.06 10/12/2015 1557   CALCIUM 8.8 (L) 10/24/2017 0641   GFRNONAA >60 10/24/2017 0641   GFRAA >60 10/24/2017 0641   CBC    Component Value Date/Time   WBC 13.3 (H) 10/24/2017 0641   RBC 3.48 (L) 10/24/2017 0641   HGB 10.0 (L) 10/24/2017 0641   HCT 32.2 (L) 10/24/2017 0641   PLT 529 (H) 10/24/2017 0641   MCV 92.5 10/24/2017 0641   MCH 28.7 10/24/2017 0641   MCHC 31.1 10/24/2017 0641   RDW 15.2 10/24/2017 0641   LYMPHSABS 3.1 10/21/2017 0633   MONOABS 0.5 10/21/2017 0633   EOSABS 0.1 10/21/2017 0633   BASOSABS 0.0 10/21/2017 0633   CBG's (last 3):   Recent Labs    10/28/17 2117 10/29/17 0548 10/29/17 1112  GLUCAP 175* 113* 225*   LFT's Lab Results  Component Value Date   ALT 34  10/21/2017   AST 33 10/21/2017   ALKPHOS 89 10/21/2017   BILITOT 0.5 10/21/2017    Studies/Results: No results found.  Medications:  I have reviewed the patient's current medications. Scheduled Medications: . atorvastatin  80 mg Oral Daily  . bethanechol  10 mg Oral QID  . docusate sodium  100 mg Oral BID  . enoxaparin (LOVENOX) injection  40 mg Subcutaneous Q24H  . feeding supplement (PRO-STAT SUGAR FREE 64)  30 mL Oral BID  . insulin aspart  0-5 Units Subcutaneous QHS  . insulin aspart  0-9 Units Subcutaneous TID WC  . insulin aspart  9 Units Subcutaneous TID WC  . insulin glargine  24 Units Subcutaneous QHS  . loratadine  10 mg Oral Daily  . metoprolol succinate  25 mg Oral QPM  . nutrition supplement (JUVEN)  1 packet Oral BID BM  . saccharomyces boulardii  250 mg Oral BID  . sacubitril-valsartan  1 tablet Oral BID  . tamsulosin  0.8 mg Oral QPC supper   PRN Medications: acetaminophen, alum & mag hydroxide-simeth, bisacodyl, diphenhydrAMINE, guaiFENesin-dextromethorphan, lidocaine, methocarbamol, oxyCODONE, polyethylene glycol, prochlorperazine **OR** prochlorperazine **OR** prochlorperazine, sodium chloride, sodium phosphate, traZODone  Assessment/Plan: Principal Problem:   Unilateral complete BKA (HCC) Active Problems:   Diabetic foot ulcer with osteomyelitis ([place])   Poorly controlled diabetes mellitus ([place])  Chronic systolic CHF (congestive heart failure) (HCC)   Status post below knee amputation, right (HCC)   Hypoalbuminemia due to protein-calorie malnutrition ([place])   Urinary retention   Acute blood loss anemia   Length of stay, days: 9  problems as listed 4/6, unchanged - continue ongoing mgmt Ok for grounds pass with friends/family this afternoon  Brian Herring A. Asa Lente, MD 10/29/2017, 11:37 AM

## 2017-10-30 ENCOUNTER — Inpatient Hospital Stay (HOSPITAL_COMMUNITY): Payer: BLUE CROSS/BLUE SHIELD | Admitting: Occupational Therapy

## 2017-10-30 ENCOUNTER — Inpatient Hospital Stay (HOSPITAL_COMMUNITY): Payer: BLUE CROSS/BLUE SHIELD | Admitting: Physical Therapy

## 2017-10-30 LAB — GLUCOSE, CAPILLARY
GLUCOSE-CAPILLARY: 124 mg/dL — AB (ref 65–99)
GLUCOSE-CAPILLARY: 208 mg/dL — AB (ref 65–99)
GLUCOSE-CAPILLARY: 142 mg/dL — AB (ref 65–99)
GLUCOSE-CAPILLARY: 197 mg/dL — AB (ref 65–99)

## 2017-10-30 MED ORDER — BETHANECHOL CHLORIDE 25 MG PO TABS
25.0000 mg | ORAL_TABLET | Freq: Three times a day (TID) | ORAL | Status: DC
  Administered 2017-10-30 – 2017-10-31 (×4): 25 mg via ORAL
  Filled 2017-10-30 (×4): qty 1

## 2017-10-30 NOTE — Progress Notes (Signed)
Occupational Therapy Session Note  Patient Details  Name: Brian Herring MRN: [id number] Date of Birth: 05/17/1956  Today's Date: 10/30/2017 OT Individual Time: 1030-1200 OT Individual Time Calculation (min): 90 min    Short Term Goals: Week 1:  OT Short Term Goal 1 (Week 1): STGs equal to LTGs based on ELOS      Skilled Therapeutic Interventions/Progress Updates:    Pt seen this session for ADL training with a focus on pt initiating steps himself to plan for what he will need as he will need to be mod I for home. Pt demonstrated safety awareness and proficiency with all steps of grooming, toileting, bathing, and transfers with mod I.  Pt preferred to shower in his room so he transferred w/c >< tub bench.  He then self propelled to ADL apt to practice tub transfers to bench in tub that has same set up as his home.  Pt believes a w/c will fit into his bathroom so practiced w/c >< tub bench and then again with RW to ensure he could do that transfer safely.  He was able to use RW to transfer on and off bench and on/off toilet.  Pt was able to take hopping steps with RW with mod I.    Spent quite a bit of time discussing meal prep adaptations and safety in the kitchen.  Pt is quite prepared and put a lot of thought into how to modify his routine and meal prep to complete tasks safely from his w/c. HHOT can follow up with further IADL skills.   Pt in room with all needs met.  Because pt has demonstrated safe transfers to bed and toilet, he is now mod I in his room with use of his w/c.  Therapy Documentation Precautions:  Precautions Precautions: Fall Precaution Comments: questionable bed bugs Required Braces or Orthoses: Knee Immobilizer - Left(limbguard) Knee Immobilizer - Left: Other (comment)(limbguard) Restrictions Weight Bearing Restrictions: Yes RLE Weight Bearing: Non weight bearing    Pain: C/o mild pain over sore area on R side of limb. RN aware and covered area.  ADL:    See Function Navigator for Current Functional Status.   Therapy/Group: Individual Therapy  [place] 10/30/2017, 11:09 AM

## 2017-10-30 NOTE — Progress Notes (Signed)
Social Work Patient ID: Brian Herring, male   DOB: [birth year], 62 y.o.   MRN: [id number]  Pt trying to learn I & O cath he just started today. Will have home health RN follow up with education. Equipment to be delivered today to room and await MD if medically ready for DC tomorrow.

## 2017-10-30 NOTE — Progress Notes (Signed)
Occupational Therapy Discharge Summary  Patient Details  Name: Brian Herring MRN: [id number] Date of Birth: 26-Feb-1956   Patient has met 11 of 11 long term goals due to improved activity tolerance, improved balance and ability to compensate for deficits.  Patient to discharge at overall Modified Independent level with self care, transfers using a RW or a w/c, laundry and simple meal prep.  Patient lives alone. He has friends to assist with grocery shopping, errands and driving.   Reasons goals not met: n/a  Recommendation:  Patient will benefit from ongoing skilled OT services in home health setting to continue to advance functional skills in the area of iADL.  Equipment: BSC, tub bench  Reasons for discharge: treatment goals met  Patient/family agrees with progress made and goals achieved: Yes  OT Discharge Precautions/Restrictions  Precautions Precautions: Fall Required Braces or Orthoses: Knee Immobilizer - Left(limbguard) Knee Immobilizer - Left: Other (comment)(limbguard) Restrictions Weight Bearing Restrictions: Yes RLE Weight Bearing: Non weight bearing  ADL  mod I with BADLs Vision Baseline Vision/History: Wears glasses Wears Glasses: Reading only;Distance only Patient Visual Report: No change from baseline Vision Assessment?: No apparent visual deficits Perception  Perception: Within Functional Limits Praxis Praxis: Intact Cognition Overall Cognitive Status: Within Functional Limits for tasks assessed Arousal/Alertness: Awake/alert Orientation Level: Oriented X4 Attention: Sustained;Focused Focused Attention: Appears intact Sustained Attention: Appears intact Memory: Appears intact Awareness: Appears intact Problem Solving: Appears intact Safety/Judgment: Appears intact Sensation Sensation Light Touch: Impaired Detail Light Touch Impaired Details: Impaired RUE;Impaired LUE Proprioception: Appears Intact Additional Comments: Pt reports numbness in  bilateral fingers. Coordination Gross Motor Movements are Fluid and Coordinated: Yes Heel Shin Test: NT d/t amputation Motor  Motor Motor: Within Functional Limits Motor - Discharge Observations: mild generalized weakness Mobility  Bed Mobility Bed Mobility: Sit to Supine;Supine to Sit;Rolling Right;Rolling Left Rolling Right: 6: Modified independent (Device/Increase time) Rolling Left: 6: Modified independent (Device/Increase time) Supine to Sit: 6: Modified independent (Device/Increase time);HOB flat Sit to Supine: 6: Modified independent (Device/Increase time);HOB flat Transfers Sit to Stand: 6: Modified independent (Device/Increase time);With armrests  Trunk/Postural Assessment  Cervical Assessment Cervical Assessment: Within Functional Limits Thoracic Assessment Thoracic Assessment: Exceptions to WFL(increased kyphosis) Lumbar Assessment Lumbar Assessment: Exceptions to WFL(decreased lordosis) Postural Control Postural Control: Within Functional Limits  Balance Balance Balance Assessed: Yes Static Sitting Balance Static Sitting - Balance Support: No upper extremity supported Static Sitting - Level of Assistance: 6: Modified independent (Device/Increase time) Dynamic Sitting Balance Dynamic Sitting - Balance Support: During functional activity;Feet supported Dynamic Sitting - Level of Assistance: 6: Modified independent (Device/Increase time) Dynamic Sitting - Balance Activities: Lateral lean/weight shifting;Forward lean/weight shifting;Reaching for objects;Reaching across midline Static Standing Balance Static Standing - Balance Support: Bilateral upper extremity supported Static Standing - Level of Assistance: 6: Modified independent (Device/Increase time) Dynamic Standing Balance Dynamic Standing - Balance Support: Bilateral upper extremity supported;During functional activity Dynamic Standing - Level of Assistance: 6: Modified independent (Device/Increase  time) Extremity/Trunk Assessment RUE Assessment RUE Assessment: Within Functional Limits LUE Assessment LUE Assessment: Exceptions to WFL(Hx of rotator cuff tear, grossly WFL for functional tasks)   See Function Navigator for Current Functional Status.  [place] 10/30/2017, 12:16 PM

## 2017-10-30 NOTE — Progress Notes (Signed)
Occupational Therapy Session Note  Patient Details  Name: Brian Herring MRN: [id number] Date of Birth: 08-09-1955  Today's Date: 10/30/2017 OT Individual Time: 0930-1000 OT Individual Time Calculation (min): 30 min    Short Term Goals: Week 1:  OT Short Term Goal 1 (Week 1): STGs equal to LTGs based on ELOS  Skilled Therapeutic Interventions/Progress Updates:    Upon entering the room, pt supine in bed with no c/o pain this session. Pt transferred from bed >wheelchair with lateral scoot at overall mod I level. Pt managed wheelchair leg rest and propelled self to ADL apartment with focus on kitchen mobility. OT educated and demonstrated kitchen set up to increase independence and function from wheelchair level. Pt demonstrated ability to transfer items from one location to another, wash dishes, obtain items from drawers and refrigerator without issue. Pt asking questions as needed and returning to room at end of session. Pt set up wheelchair and transferred back into bed at mod I level. OT notified RN staff of pt's independence with transfers. Call bell and all needed items within reach.  Therapy Documentation Precautions:  Precautions Precautions: Fall Precaution Comments: questionable bed bugs Required Braces or Orthoses: Knee Immobilizer - Left(limbguard) Knee Immobilizer - Left: Other (comment)(limbguard) Restrictions Weight Bearing Restrictions: Yes RLE Weight Bearing: Non weight bearing General:   Vital Signs: Therapy Vitals Temp: 97.8 F (36.6 C) Temp Source: Oral Pulse Rate: (!) 107 Resp: 16 BP: 108/66 Patient Position (if appropriate): Lying Oxygen Therapy SpO2: 93 % O2 Device: Room Air    Praxis Praxis: Intact Exercises:   Other Treatments:    See Function Navigator for Current Functional Status.   Therapy/Group: Individual Therapy    [place] 10/30/2017, 4:37 PM

## 2017-10-30 NOTE — Plan of Care (Signed)
  Problem: Consults Goal: Skin Care Protocol Initiated - if Braden Score 18 or less Description If consults are not indicated, leave blank or document N/A Outcome: Progressing Goal: Diabetes Guidelines if Diabetic/Glucose > 140 Description If diabetic or lab glucose is > 140 mg/dl - Initiate Diabetes/Hyperglycemia Guidelines & Document Interventions  Outcome: Progressing Goal: RH LIMB LOSS PATIENT EDUCATION Description Description: See Patient Education module for eduction specifics. Outcome: Progressing Goal: RH LIMB LOSS PATIENT EDUCATION Description Description: See Patient Education module for eduction specifics. Outcome: Progressing   Problem: RH BOWEL ELIMINATION Goal: RH STG MANAGE BOWEL WITH ASSISTANCE Description STG Manage Bowel with Min. Assistance.  Outcome: Progressing   Problem: RH BLADDER ELIMINATION Goal: RH STG MANAGE BLADDER WITH ASSISTANCE Description STG Manage Bladder With min. Assistance  Outcome: Progressing   Problem: RH SKIN INTEGRITY Goal: RH STG SKIN FREE OF INFECTION/BREAKDOWN Description With min. Assist.  Outcome: Progressing Goal: RH STG ABLE TO PERFORM INCISION/WOUND CARE W/ASSISTANCE Description STG Able To Perform Incision/Wound Care With Min. Assistance.  Outcome: Progressing   Problem: RH SAFETY Goal: RH STG ADHERE TO SAFETY PRECAUTIONS W/ASSISTANCE/DEVICE Description STG Adhere to Safety Precautions With Mod. Assistance/Device.  Outcome: Progressing   Problem: RH PAIN MANAGEMENT Goal: RH STG PAIN MANAGED AT OR BELOW PT'S PAIN GOAL Description Less than 3,on 1 to 10 scale.  Outcome: Progressing   Problem: RH KNOWLEDGE DEFICIT LIMB LOSS Goal: RH STG INCREASE KNOWLEDGE OF SELF CARE AFTER LIMB LOSS Description Pt. Will be able to verbalized care and precautions of the stump with the education provided by staff.  Outcome: Progressing   Problem: RH BLADDER ELIMINATION Goal: RH STG MANAGE BLADDER WITH EQUIPMENT WITH  ASSISTANCE Description STG Manage Bladder With Equipment With min Assistance  Outcome: Progressing

## 2017-10-30 NOTE — Progress Notes (Signed)
Subjective/Complaints:  Still not voiding sometimes feeling urge, no leakage even with high vol per RN Has been told by PCP that he has enlarged prostate  ROS  Denies CP, SOB, N/V/D.  Objective: Vital Signs: Blood pressure 100/63, pulse 81, temperature 97.6 F (36.4 C), temperature source Oral, resp. rate 18, height 6' (1.829 m), weight 102.2 kg (225 lb 5 oz), SpO2 97 %. No results found. Results for orders placed or performed during the hospital encounter of 10/20/17 (from the past 72 hour(s))  Glucose, capillary     Status: Abnormal   Collection Time: 10/27/17 11:54 AM  Result Value Ref Range   Glucose-Capillary 196 (H) 65 - 99 mg/dL  Glucose, capillary     Status: Abnormal   Collection Time: 10/27/17  4:49 PM  Result Value Ref Range   Glucose-Capillary 205 (H) 65 - 99 mg/dL  Glucose, capillary     Status: Abnormal   Collection Time: 10/27/17  9:02 PM  Result Value Ref Range   Glucose-Capillary 150 (H) 65 - 99 mg/dL  Glucose, capillary     Status: None   Collection Time: 10/28/17  6:17 AM  Result Value Ref Range   Glucose-Capillary 99 65 - 99 mg/dL  Glucose, capillary     Status: Abnormal   Collection Time: 10/28/17 12:02 PM  Result Value Ref Range   Glucose-Capillary 146 (H) 65 - 99 mg/dL  Glucose, capillary     Status: Abnormal   Collection Time: 10/28/17  4:32 PM  Result Value Ref Range   Glucose-Capillary 137 (H) 65 - 99 mg/dL  Glucose, capillary     Status: Abnormal   Collection Time: 10/28/17  9:17 PM  Result Value Ref Range   Glucose-Capillary 175 (H) 65 - 99 mg/dL  Glucose, capillary     Status: Abnormal   Collection Time: 10/29/17  5:48 AM  Result Value Ref Range   Glucose-Capillary 113 (H) 65 - 99 mg/dL  Glucose, capillary     Status: Abnormal   Collection Time: 10/29/17 11:12 AM  Result Value Ref Range   Glucose-Capillary 225 (H) 65 - 99 mg/dL  Glucose, capillary     Status: Abnormal   Collection Time: 10/29/17  4:28 PM  Result Value Ref Range   Glucose-Capillary 144 (H) 65 - 99 mg/dL  Glucose, capillary     Status: Abnormal   Collection Time: 10/29/17  9:11 PM  Result Value Ref Range   Glucose-Capillary 188 (H) 65 - 99 mg/dL  Glucose, capillary     Status: Abnormal   Collection Time: 10/30/17  6:59 AM  Result Value Ref Range   Glucose-Capillary 124 (H) 65 - 99 mg/dL    Gen NAD. Well-developed. HEENT: Normocephalic, atraumatic.  Cardio: RRR and no JVD Resp: CTA B/L and unlabored GI: BS positive and ND Musc/Skel:  Right BKA TTP Neuro: Alert/Oriented Motor: 4+/5 R HF  Skin:   BKA c/d/i  Assessment/Plan: 1. Functional deficits secondary to Right BKA which require 3+ hours per day of interdisciplinary therapy in a comprehensive inpatient rehab setting. Physiatrist is providing close team supervision and 24 hour management of active medical problems listed below. Physiatrist and rehab team continue to assess barriers to discharge/monitor patient progress toward functional and medical goals. FIM: Function - Bathing Position: Shower Body parts bathed by patient: Right arm, Left arm, Chest, Abdomen, Front perineal area, Buttocks, Right upper leg, Left upper leg, Left lower leg Body parts bathed by helper: Back Bathing not applicable: Right lower leg Assist Level: Supervision or  verbal cues  Function- Upper Body Dressing/Undressing What is the patient wearing?: Pull over shirt/dress Pull over shirt/dress - Perfomed by patient: Thread/unthread right sleeve, Thread/unthread left sleeve, Put head through opening, Pull shirt over trunk Assist Level: Set up Function - Lower Body Dressing/Undressing What is the patient wearing?: Pants, Shoes, Non-skid slipper socks Position: Bed Pants- Performed by patient: Thread/unthread right pants leg, Thread/unthread left pants leg, Pull pants up/down Pants- Performed by helper: Pull pants up/down Non-skid slipper socks- Performed by patient: Don/doff left sock Shoes - Performed by patient:  Don/doff left shoe, Fasten left Assist for footwear: Supervision/touching assist Assist for lower body dressing: Touching or steadying assistance (Pt > 75%)  Function - Toileting Toileting activity did not occur: No continent bowel/bladder event Toileting steps completed by patient: Performs perineal hygiene(no underwear) Toileting steps completed by helper: Adjust clothing prior to toileting, Performs perineal hygiene, Adjust clothing after toileting Toileting Assistive Devices: Grab bar or rail Assist level: Touching or steadying assistance (Pt.75%)  Function - Air cabin crew transfer activity did not occur: N/A Toilet transfer assistive device: Grab bar, Bedside commode Assist level to toilet: Touching or steadying assistance (Pt > 75%) Assist level from toilet: Touching or steadying assistance (Pt > 75%) Assist level to bedside commode (at bedside): Touching or steadying assistance (Pt > 75%) Assist level from bedside commode (at bedside): Touching or steadying assistance (Pt > 75%)  Function - Chair/bed transfer Chair/bed transfer method: Lateral scoot Chair/bed transfer assist level: Set up only Chair/bed transfer assistive device: Armrests Chair/bed transfer details: Verbal cues for precautions/safety, Verbal cues for safe use of DME/AE  Function - Locomotion: Wheelchair Will patient use wheelchair at discharge?: Yes Type: Manual Max wheelchair distance: 573ft  Assist Level: Supervision or verbal cues Assist Level: Supervision or verbal cues Assist Level: Supervision or verbal cues Turns around,maneuvers to table,bed, and toilet,negotiates 3% grade,maneuvers on rugs and over doorsills: Yes Function - Locomotion: Ambulation Assistive device: Walker-rolling Max distance: 8 Assist level: Touching or steadying assistance (Pt > 75%) Walk 10 feet activity did not occur: Safety/medical concerns(fatigue) Walk 50 feet with 2 turns activity did not occur: Safety/medical  concerns Walk 150 feet activity did not occur: Safety/medical concerns Walk 10 feet on uneven surfaces activity did not occur: Safety/medical concerns  Function - Comprehension Comprehension: Auditory Comprehension assist level: Follows basic conversation/direction with no assist  Function - Expression Expression: Verbal Expression assist level: Expresses complex ideas: With no assist  Function - Social Interaction Social Interaction assist level: Interacts appropriately with others with medication or extra time (anti-anxiety, antidepressant).  Function - Problem Solving Problem solving assist level: Solves basic problems with no assist  Function - Memory Memory assist level: Recognizes or recalls 90% of the time/requires cueing < 10% of the time Patient normally able to recall (first 3 days only): Current season, Location of own room, Staff names and faces, That he or she is in a hospital  Medical Problem List and Plan:  1. Functional and mobility deficits secondary to right BKA     Cont CIR PT, OT pal d/c in am 2. DVT Prophylaxis/Anticoagulation: Pharmaceutical: Lovenox  3. Pain Management: Oxycodone prn effective  4. Mood: LCSW to follow for evaluation and support.  5. Neuropsych: This patient is capable of making decisions on his own behalf.  6. Skin/Wound Care: Routine pressure relief measures.  7. Fluids/Electrolytes/Nutrition: Monitor I/O.  8. T2DM: Monitor BS ac/hs continue    Lantus at bedtime with meal coverage CBG (last 3)  Recent Labs  10/29/17 1628 10/29/17 2111 10/30/17 0659  GLUCAP 144* 188* 124*    Increased Lantus to 20U on 3/31, increased to 24 units on 4/3, am CBG ok   Novolog  9 U at meals  Controlled 4/8 9. Mild hyponatremia:     Na 134 on 4/2 10 ABLA:    Hb 10.0 on 4/2 11. CAD with chronic systolic CHF: Monitor daily weights and for other signs of overload.  Filed Weights   10/26/17 0505 10/28/17 0500 10/29/17 0500  Weight: 101.7 kg (224 lb  3.3 oz) 105.3 kg (232 lb 3.2 oz) 102.2 kg (225 lb 5 oz)  12.  Urinary retention   Flomax started, increased on 4/3    Foley d/ced 4/2   Bethanechol 10mg  3 times a day started on 4/4, increase to 25mg  TID If not voidng by d/c may teach ICP v insert foely with uro f/u 1-2 wk 13. Leukocytosis   WBCs 13.3 on 4/2   Afebrile  Urine culture NG  Cont to monitor 14. Hypoalbuminemia   Supplement initiated  LOS (Days) 10 A FACE TO FACE EVALUATION WAS PERFORMED  Brian Herring 10/30/2017, 7:00 AM

## 2017-10-30 NOTE — Progress Notes (Signed)
Physical Therapy Discharge Summary  Patient Details  Name: Brian Herring MRN: [id number] Date of Birth: [birth year]  Today's Date: 10/30/2017 PT Individual Time: 0800-0900 PT Individual Time Calculation (min): 60 min    Patient has met 11 of 11 long term goals due to improved activity tolerance, improved balance, improved postural control, increased strength, decreased pain and ability to compensate for deficits.  Patient to discharge at a wheelchair level Modified Independent; recommend S for ambulation in home.   Patient's care partner is independent to provide the necessary physical intermittent assistance at discharge, S for stairs for home entry until ramp is built.  Reasons goals not met: All goals met  Recommendation:  Patient will benefit from ongoing skilled PT services in home health setting to continue to advance safe functional mobility, address ongoing impairments in strength, ROM, activity tolerance, and minimize fall risk.  Equipment: W/c, RW  Reasons for discharge: treatment goals met and discharge from hospital  Patient/family agrees with progress made and goals achieved: Yes  PT Discharge Precautions/Restrictions Precautions Precautions: Fall Required Braces or Orthoses: Knee Immobilizer - Left(limbguard) Knee Immobilizer - Left: Other (comment)(limbguard) Restrictions Weight Bearing Restrictions: Yes RLE Weight Bearing: Non weight bearing Pain Pain Assessment Pain Scale: 0-10 Pain Score: 0-No pain Vision/Perception  Perception Perception: Within Functional Limits Praxis Praxis: Intact  Cognition Overall Cognitive Status: Within Functional Limits for tasks assessed Arousal/Alertness: Awake/alert Orientation Level: Oriented X4 Attention: Sustained;Focused Focused Attention: Appears intact Sustained Attention: Appears intact Memory: Appears intact Awareness: Appears intact Problem Solving: Appears intact Safety/Judgment: Appears  intact Sensation Sensation Light Touch: Impaired Detail Light Touch Impaired Details: Impaired RUE;Impaired LUE Proprioception: Appears Intact Additional Comments: Pt reports numbness in bilateral fingers. Coordination Gross Motor Movements are Fluid and Coordinated: Yes Heel Shin Test: NT d/t amputation Motor  Motor Motor: Within Functional Limits Motor - Discharge Observations: mild generalized weakness  Mobility Bed Mobility Bed Mobility: Sit to Supine;Supine to Sit;Rolling Right;Rolling Left Rolling Right: 6: Modified independent (Device/Increase time) Rolling Left: 6: Modified independent (Device/Increase time) Supine to Sit: 6: Modified independent (Device/Increase time);HOB flat Sit to Supine: 6: Modified independent (Device/Increase time);HOB flat Transfers Transfers: Yes Sit to Stand: 6: Modified independent (Device/Increase time);With armrests Stand Pivot Transfers: 6: Modified independent (Device/Increase time) Squat Pivot Transfers: 6: Modified independent (Device/Increase time) Locomotion  Ambulation Ambulation: Yes Ambulation/Gait Assistance: 5: Supervision Ambulation Distance (Feet): 25 Feet Assistive device: Rolling walker Ambulation/Gait Assistance Details: Verbal cues for technique;Verbal cues for precautions/safety;Verbal cues for safe use of DME/AE Gait Gait: Yes Gait Pattern: Impaired Gait Pattern: Poor foot clearance - right Gait velocity: significantly decreased Stairs / Additional Locomotion Stairs: Yes Stairs Assistance: 5: Supervision;4: Min guard Stairs Assistance Details: Verbal cues for technique;Verbal cues for precautions/safety Stair Management Technique: Two rails;Backwards Number of Stairs: 4 Height of Stairs: 6 Ramp: 5: Supervision(in w/c) Wheelchair Mobility Wheelchair Mobility: Yes Wheelchair Assistance: 6: Modified independent (Device/Increase time) Environmental health practitioner: Both upper extremities Wheelchair Parts Management:  Independent Distance: >150'  Trunk/Postural Assessment  Cervical Assessment Cervical Assessment: Within Functional Limits Thoracic Assessment Thoracic Assessment: Exceptions to WFL(increased kyphosis) Lumbar Assessment Lumbar Assessment: Exceptions to WFL(decreased lordosis) Postural Control Postural Control: Within Functional Limits  Balance Balance Balance Assessed: Yes Static Sitting Balance Static Sitting - Balance Support: No upper extremity supported Static Sitting - Level of Assistance: 6: Modified independent (Device/Increase time) Dynamic Sitting Balance Dynamic Sitting - Balance Support: During functional activity;Feet supported Dynamic Sitting - Level of Assistance: 6: Modified independent (Device/Increase time) Dynamic Sitting - Balance Activities: Lateral lean/weight  shifting;Forward lean/weight shifting;Reaching for objects;Reaching across midline Static Standing Balance Static Standing - Balance Support: Bilateral upper extremity supported Static Standing - Level of Assistance: 6: Modified independent (Device/Increase time) Dynamic Standing Balance Dynamic Standing - Balance Support: Bilateral upper extremity supported;During functional activity Dynamic Standing - Level of Assistance: 6: Modified independent (Device/Increase time) Extremity Assessment  RUE Assessment RUE Assessment: Within Functional Limits LUE Assessment LUE Assessment: Exceptions to WFL(Hx of rotator cuff tear, grossly WFL for functional tasks) RLE Assessment RLE Assessment: Exceptions to WFL(knee extension limited -5 degrees, hip/knee grossly 4+/5 throughout) LLE Assessment LLE Assessment: Within Functional Limits  Skilled Therapeutic Intervention: Tx 1: Pt received seated in bed, denies pain and agreeable to treatment. Assessed bed mobility, transfers w/c <>bed, car transfers, toilet transfers all with modI. Performed steps at 3" height with 1 handrail sideways with min guard, one trial on 6"  steps backwards with min guard. Discussed setup of equipment to minimize caregiver burden and reduce risk of falls; pt plans to ascend steps backwards, have RW at landing, turn with RW to sit in w/c placed inside door up over threshold step. Demonstrated to pt how to bump w/c up threshold step if needed. Pt practices donning/doffing RLE shrinker; min cues for technique. Returned to room modI w/c propulsion. Remained seated in w/c at end of session, all needs in reach.   Tx 2: Pt received in bed, denies pain and agreeable to treatment. Supine>sit modI. Gait with RW x25' with S; limited by UE fatigue. Squat pivot transfer w/c <>mat with modI. Reviewed LE HEP including BLE strengthening and R hip flexor/hamstring ROM to prepare for prosthetic, recommendations for daily skin check L foot and R residual limb at incision, and use of limb guard RLE, shoe LLE for skin protection. Added prone "superman" and supine crunches to HEP, provided handouts. Provided pt with handout of local amputee support group and discussed benefits of participation. Pt with no further questions/concerns regarding d/c at this time. Remained seated in w/c at end of session, all needs in reach.    See Function Navigator for Current Functional Status.  Benjiman Core Tygielski 10/30/2017, 9:29 AM

## 2017-10-31 LAB — BASIC METABOLIC PANEL
ANION GAP: 10 (ref 5–15)
BUN: 17 mg/dL (ref 6–20)
CALCIUM: 9.4 mg/dL (ref 8.9–10.3)
CO2: 26 mmol/L (ref 22–32)
Chloride: 101 mmol/L (ref 101–111)
Creatinine, Ser: 0.73 mg/dL (ref 0.61–1.24)
GFR calc Af Amer: >60 mL/min (ref >60)
GFR calc non Af Amer: >60 mL/min (ref >60)
GLUCOSE: 140 mg/dL — AB (ref 65–99)
Potassium: 4 mmol/L (ref 3.5–5.1)
Sodium: 137 mmol/L (ref 135–145)

## 2017-10-31 LAB — CBC
HEMATOCRIT: 35.6 % — AB (ref 39.0–52.0)
Hemoglobin: 10.8 g/dL — ABNORMAL LOW (ref 13.0–17.0)
MCH: 28.6 pg (ref 26.0–34.0)
MCHC: 30.3 g/dL (ref 30.0–36.0)
MCV: 94.2 fL (ref 78.0–100.0)
Platelets: 515 10*3/uL — ABNORMAL HIGH (ref 150–400)
RBC: 3.78 MIL/uL — ABNORMAL LOW (ref 4.22–5.81)
RDW: 15.3 % (ref 11.5–15.5)
WBC: 9.3 10*3/uL (ref 4.0–10.5)

## 2017-10-31 LAB — GLUCOSE, CAPILLARY
Glucose-Capillary: 126 mg/dL — ABNORMAL HIGH (ref 65–99)
Glucose-Capillary: 178 mg/dL — ABNORMAL HIGH (ref 65–99)

## 2017-10-31 MED ORDER — OXYCODONE HCL 5 MG PO TABS: 5.0000 mg | ORAL_TABLET | Freq: Four times a day (QID) | ORAL | 0 refills | Status: DC | PRN

## 2017-10-31 MED ORDER — INSULIN ASPART 100 UNIT/ML ~~LOC~~ SOLN: 9.0000 [IU] | Freq: Three times a day (TID) | SUBCUTANEOUS | 11 refills | Status: DC

## 2017-10-31 MED ORDER — ACETAMINOPHEN 325 MG PO TABS: 325.0000 mg | ORAL_TABLET | ORAL | Status: DC | PRN

## 2017-10-31 MED ORDER — INSULIN GLARGINE 100 UNIT/ML SOLOSTAR PEN: 24.0000 [IU] | PEN_INJECTOR | Freq: Every day | SUBCUTANEOUS | 0 refills | Status: DC

## 2017-10-31 MED ORDER — BETHANECHOL CHLORIDE 25 MG PO TABS: 25.0000 mg | ORAL_TABLET | Freq: Three times a day (TID) | ORAL | 0 refills | Status: DC

## 2017-10-31 MED ORDER — BLOOD GLUCOSE MONITOR KIT: PACK | 0 refills | Status: AC

## 2017-10-31 MED ORDER — GLIMEPIRIDE 4 MG PO TABS: 2.0000 mg | ORAL_TABLET | ORAL | 11 refills | Status: DC

## 2017-10-31 MED ORDER — TAMSULOSIN HCL 0.4 MG PO CAPS: 0.8000 mg | ORAL_CAPSULE | Freq: Every day | ORAL | 0 refills | Status: DC

## 2017-10-31 MED ORDER — INSULIN GLARGINE 100 UNITS/ML SOLOSTAR PEN: 24.0000 [IU] | PEN_INJECTOR | Freq: Every day | SUBCUTANEOUS | 0 refills | Status: DC

## 2017-10-31 MED ORDER — INSULIN GLARGINE 100 UNIT/ML ~~LOC~~ SOLN: 24.0000 [IU] | Freq: Every day | SUBCUTANEOUS | 11 refills | Status: DC

## 2017-10-31 MED ORDER — METHOCARBAMOL 500 MG PO TABS: 500.0000 mg | ORAL_TABLET | Freq: Four times a day (QID) | ORAL | 0 refills | Status: DC | PRN

## 2017-10-31 MED ORDER — INSULIN PEN NEEDLE 31G X 6 MM MISC: 1.0000 "application " | Freq: Every day | 0 refills | Status: DC

## 2017-10-31 MED ORDER — LIDOCAINE HCL 2 % EX GEL: CUTANEOUS | 0 refills | Status: DC | PRN

## 2017-10-31 MED ORDER — DOCUSATE SODIUM 100 MG PO CAPS: 100.0000 mg | ORAL_CAPSULE | Freq: Two times a day (BID) | ORAL | 0 refills | Status: DC

## 2017-10-31 MED ORDER — SALINE SPRAY 0.65 % NA SOLN: 1.0000 | NASAL | 0 refills | Status: DC | PRN

## 2017-10-31 MED ORDER — METOPROLOL SUCCINATE ER 25 MG PO TB24: 25.0000 mg | ORAL_TABLET | Freq: Every evening | ORAL | 0 refills | Status: DC

## 2017-10-31 NOTE — Progress Notes (Signed)
Subjective/Complaints: Pt seen lying in bed this AM.  He slept well overnight. He is ready for discharge.   ROS  Denies CP, SOB, N/V/D.  Objective: Vital Signs: Blood pressure 114/80, pulse 81, temperature 97.9 F (36.6 C), temperature source Oral, resp. rate 17, height 6' (1.829 m), weight 102.2 kg (225 lb 5 oz), SpO2 96 %. No results found. Results for orders placed or performed during the hospital encounter of 10/20/17 (from the past 72 hour(s))  Glucose, capillary     Status: Abnormal   Collection Time: 10/28/17 12:02 PM  Result Value Ref Range   Glucose-Capillary 146 (H) 65 - 99 mg/dL  Glucose, capillary     Status: Abnormal   Collection Time: 10/28/17  4:32 PM  Result Value Ref Range   Glucose-Capillary 137 (H) 65 - 99 mg/dL  Glucose, capillary     Status: Abnormal   Collection Time: 10/28/17  9:17 PM  Result Value Ref Range   Glucose-Capillary 175 (H) 65 - 99 mg/dL  Glucose, capillary     Status: Abnormal   Collection Time: 10/29/17  5:48 AM  Result Value Ref Range   Glucose-Capillary 113 (H) 65 - 99 mg/dL  Glucose, capillary     Status: Abnormal   Collection Time: 10/29/17 11:12 AM  Result Value Ref Range   Glucose-Capillary 225 (H) 65 - 99 mg/dL  Glucose, capillary     Status: Abnormal   Collection Time: 10/29/17  4:28 PM  Result Value Ref Range   Glucose-Capillary 144 (H) 65 - 99 mg/dL  Glucose, capillary     Status: Abnormal   Collection Time: 10/29/17  9:11 PM  Result Value Ref Range   Glucose-Capillary 188 (H) 65 - 99 mg/dL  Glucose, capillary     Status: Abnormal   Collection Time: 10/30/17  6:59 AM  Result Value Ref Range   Glucose-Capillary 124 (H) 65 - 99 mg/dL  Glucose, capillary     Status: Abnormal   Collection Time: 10/30/17 11:32 AM  Result Value Ref Range   Glucose-Capillary 208 (H) 65 - 99 mg/dL  Glucose, capillary     Status: Abnormal   Collection Time: 10/30/17  4:50 PM  Result Value Ref Range   Glucose-Capillary 142 (H) 65 - 99 mg/dL   Glucose, capillary     Status: Abnormal   Collection Time: 10/30/17  9:08 PM  Result Value Ref Range   Glucose-Capillary 197 (H) 65 - 99 mg/dL   Comment 1 Notify RN   Glucose, capillary     Status: Abnormal   Collection Time: 10/31/17  7:01 AM  Result Value Ref Range   Glucose-Capillary 126 (H) 65 - 99 mg/dL    Gen NAD. Well-developed. HEENT: Normocephalic, atraumatic.  Cardio: RRR and no JVD Resp: CTA B/L and unlabored GI: BS positive and ND Musc/Skel:  Right BKA TTP Neuro: Alert/Oriented Motor: 4+/5 R HF  Skin:   BKA incision c/d/i  Assessment/Plan: 1. Functional deficits secondary to Right BKA which require 3+ hours per day of interdisciplinary therapy in a comprehensive inpatient rehab setting. Physiatrist is providing close team supervision and 24 hour management of active medical problems listed below. Physiatrist and rehab team continue to assess barriers to discharge/monitor patient progress toward functional and medical goals. FIM: Function - Bathing Position: Shower Body parts bathed by patient: Right arm, Left arm, Chest, Abdomen, Front perineal area, Buttocks, Right upper leg, Left upper leg, Left lower leg Body parts bathed by helper: Back Bathing not applicable: Right lower leg Assist  Level: More than reasonable time  Function- Upper Body Dressing/Undressing What is the patient wearing?: Pull over shirt/dress Pull over shirt/dress - Perfomed by patient: Thread/unthread right sleeve, Thread/unthread left sleeve, Put head through opening, Pull shirt over trunk Assist Level: No help, No cues Function - Lower Body Dressing/Undressing What is the patient wearing?: Pants, Socks, Shoes Position: Wheelchair/chair at sink Pants- Performed by patient: Thread/unthread right pants leg, Thread/unthread left pants leg, Pull pants up/down Pants- Performed by helper: Pull pants up/down Non-skid slipper socks- Performed by patient: Don/doff left sock Socks - Performed by  patient: Don/doff left sock Shoes - Performed by patient: Don/doff left shoe, Fasten left Assist for footwear: Independent Assist for lower body dressing: More than reasonable time  Function - Toileting Toileting activity did not occur: No continent bowel/bladder event Toileting steps completed by patient: Adjust clothing prior to toileting, Performs perineal hygiene, Adjust clothing after toileting Toileting steps completed by helper: Adjust clothing prior to toileting, Performs perineal hygiene, Adjust clothing after toileting Toileting Assistive Devices: Grab bar or rail Assist level: More than reasonable time  Function - Air cabin crew transfer activity did not occur: N/A Toilet transfer assistive device: Elevated toilet seat/BSC over toilet Assist level to toilet: No Help, no cues, assistive device, takes more than a reasonable amount of time Assist level from toilet: No Help, no cues, assistive device, takes more than a reasonable amount of time Assist level to bedside commode (at bedside): Touching or steadying assistance (Pt > 75%) Assist level from bedside commode (at bedside): Touching or steadying assistance (Pt > 75%)  Function - Chair/bed transfer Chair/bed transfer method: Squat pivot Chair/bed transfer assist level: No Help, no cues, assistive device, takes more than a reasonable amount of time Chair/bed transfer assistive device: Armrests Chair/bed transfer details: Verbal cues for precautions/safety, Verbal cues for safe use of DME/AE  Function - Locomotion: Wheelchair Will patient use wheelchair at discharge?: Yes Type: Manual Max wheelchair distance: 150 Assist Level: No help, No cues, assistive device, takes more than reasonable amount of time Assist Level: No help, No cues, assistive device, takes more than reasonable amount of time Assist Level: No help, No cues, assistive device, takes more than reasonable amount of time Turns around,maneuvers to  table,bed, and toilet,negotiates 3% grade,maneuvers on rugs and over doorsills: Yes Function - Locomotion: Ambulation Assistive device: Walker-rolling Max distance: 25 Assist level: Supervision or verbal cues Walk 10 feet activity did not occur: Safety/medical concerns(fatigue) Assist level: Supervision or verbal cues Walk 50 feet with 2 turns activity did not occur: Safety/medical concerns(fatigue) Walk 150 feet activity did not occur: Safety/medical concerns Walk 10 feet on uneven surfaces activity did not occur: Safety/medical concerns  Function - Comprehension Comprehension: Auditory Comprehension assist level: Follows basic conversation/direction with no assist  Function - Expression Expression: Verbal Expression assist level: Expresses complex ideas: With no assist  Function - Social Interaction Social Interaction assist level: Interacts appropriately with others with medication or extra time (anti-anxiety, antidepressant).  Function - Problem Solving Problem solving assist level: Solves basic problems with no assist  Function - Memory Memory assist level: Recognizes or recalls 90% of the time/requires cueing < 10% of the time Patient normally able to recall (first 3 days only): Current season, Location of own room, Staff names and faces, That he or she is in a hospital  Medical Problem List and Plan:  1. Functional and mobility deficits secondary to right BKA     D/c today, pending labs  Will see patient for  transitional care management in 1-2 weeks 2. DVT Prophylaxis/Anticoagulation: Pharmaceutical: Lovenox  3. Pain Management: Oxycodone prn effective  4. Mood: LCSW to follow for evaluation and support.  5. Neuropsych: This patient is capable of making decisions on his own behalf.  6. Skin/Wound Care: Routine pressure relief measures.  7. Fluids/Electrolytes/Nutrition: Monitor I/O.  8. T2DM: Monitor BS ac/hs continue    Lantus at bedtime with meal coverage CBG (last  3)  Recent Labs    10/30/17 1650 10/30/17 2108 10/31/17 0701  GLUCAP 142* 197* 126*    Increased Lantus to 20U on 3/31, increased to 24 units on 4/3  Novolog 9 U at meals    Relatively controlled 4/9, will need ambulatory adjustments 9. Mild hyponatremia:     Na 134 on 4/2   Labs pending 10 ABLA:    Hb 10.0 on 4/2 11. CAD with chronic systolic CHF: Monitor daily weights and for other signs of overload.  Filed Weights   10/26/17 0505 10/28/17 0500 10/29/17 0500  Weight: 101.7 kg (224 lb 3.3 oz) 105.3 kg (232 lb 3.2 oz) 102.2 kg (225 lb 5 oz)  12.  Urinary retention   Flomax started, increased on 4/3    Foley d/ced 4/2   Bethanechol 10mg  3 times a day started on 4/4, increase to 25mg  TID  13. Leukocytosis   WBCs 13.3 on 4/2   CBC pending   Afebrile  Urine culture NG  Cont to monitor 14. Hypoalbuminemia   Supplement initiated  LOS (Days) 11 A FACE TO FACE EVALUATION WAS PERFORMED  Brian Herring Lorie Phenix 10/31/2017, 8:06 AM

## 2017-10-31 NOTE — Progress Notes (Signed)
Social Work  Discharge Note  The overall goal for the admission was met for:   Discharge location: Yes-HOME WITH INTERMITTENT ASSIST FROM FRIENDS  Length of Stay: Yes-11 DAYS  Discharge activity level: Yes-MOD/I WHEELCHAIR LEVEL  Home/community participation: Yes  Services provided included: MD, RD, PT, OT, RN, CM, TR, Pharmacy, Neuropsych and SW  Financial Services: Private Insurance: [employer]  Follow-up services arranged: Home Health: [facility], DME: [place] and Patient/Family has no preference for HH/DME agencies  Comments (or additional information):PT DID WELL AND REACHED MOD/I WHEELCHAIR LEVEL AND AMBULATION [place]. Pt to I & O cath himself educated on how and demonstrated  Patient/Family verbalized understanding of follow-up arrangements: Yes  Individual responsible for coordination of the follow-up plan: SELF  Confirmed correct DME delivered: Elease Hashimoto 10/31/2017    Elease Hashimoto

## 2017-10-31 NOTE — Discharge Summary (Signed)
Physician Discharge Summary  Patient ID: Brian Herring MRN: [id number] DOB/AGE: 62-Aug-1957 62 y.o.  Admit date: 10/20/2017 Discharge date: 10/31/2017  Discharge Diagnoses:  Principal Problem:   Unilateral complete BKA ([place]) Active Problems:   Diabetic foot ulcer with osteomyelitis ([place])   Poorly controlled diabetes mellitus ([place])   Chronic systolic CHF (congestive heart failure) ([facility])   Status post below knee amputation, right ([facility])   Hypoalbuminemia due to protein-calorie malnutrition ([place])   Urinary retention   Acute blood loss anemia   Discharged Condition: stable   Significant Diagnostic Studies: N/A   Labs:  Basic Metabolic Panel: BMP Latest Ref Rng & Units 10/31/2017 10/24/2017 10/21/2017  Glucose 65 - 99 mg/dL 140(H) 162(H) 146(H)  BUN 6 - 20 mg/dL 17 24(H) 20  Creatinine 0.61 - 1.24 mg/dL 0.73 0.83 0.79  Sodium 135 - 145 mmol/L 137 134(L) 137  Potassium 3.5 - 5.1 mmol/L 4.0 4.3 4.6  Chloride 101 - 111 mmol/L 101 97(L) 103  CO2 22 - 32 mmol/L _0 Calcium 8.9 - 10.3 mg/dL 9.4 8.8(L) 8.7(L)    CBC: CBC Latest Ref Rng & Units 10/31/2017 10/24/2017 10/23/2017  WBC 4.0 - 10.5 K/uL 9.3 13.3(H) 13.0(H)  Hemoglobin 13.0 - 17.0 g/dL 10.8(L) 10.0(L) 10.2(L)  Hematocrit 39.0 - 52.0 % 35.6(L) 32.2(L) 31.9(L)  Platelets 150 - 400 K/uL 515(H) 529(H) 482(H)    CBG: Recent Labs  Lab 10/30/17 1132 10/30/17 1650 10/30/17 2108 10/31/17 0701 10/31/17 1141  GLUCAP 208* 142* 197* 126* 178*    Brief HPI:    Brian Herring is a 62 y.o. male with history of T2DM, HTN, chronic systolic CHF who was admitted on 10/12/17 with infected foot ulcer with osteomyelitis. He was started on IV antibiotics and underwent right transtibial amputation. He has had issues with bloody stools felt to be due to ASA and Sq heparin. H/H stable. Mild hyponateremia noted and reactive leucocytosis resolving. Pain has been reasonably controlled. Therapy evaluations done and CIR recommended due to functional  deficits    Hospital Course: Brian Herring was admitted to rehab 10/20/2017 for inpatient therapies to consist of PT and OT at least three hours five days a week. Past admission physiatrist, therapy team and rehab RN have worked together to provide customized collaborative inpatient rehab. Blood pressures have been stable and no signs of overload noted. Weights were checked daily and is down to 225 lbs.  Diabetes has been monitored on achs basis and Lantus was titrated upwards for tighter control. Low dose Amaryl was resumed at discharge and novolog was discontinued. He was advised to monitor BS bid to qid and follow up with primary MD next week for further titration of diabetic regimen. Prevena VAC was discontinued after a week and R-BKA incision is intact and healing well without s/s of infection.   ABLA has been monitored and is stable. Check of lytes showed that hyponatremia has resolved and renal status has improved.  Reactive leucocytosis has resolved and he has been afebrile during his stay. He was found to have urinary retention and has required I/O caths despite addition of urecholine and flomax.  UA/UCS was negative for infection. He has been educated on I/O caths as well as insulin administration.  He has made good progress and is independent at wheelchair level. He will continue to receive follow up HHPT and [place]  by [facility] after discharge.    Rehab course: During patient's stay in rehab weekly team conferences were held to  monitor patient's progress, set goals and discuss barriers to discharge. At admission, patient required min assist with mobility and self care tasks. He  has had improvement in activity tolerance, balance, postural control as well as ability to compensate for deficits. He is able to complete ADL tasks at modified independent level. He is modified independent for transfers and  is able to ambulate 1' with supervision and use of RW. He requires supervision to climb 4  stairs.    Disposition: Home.  Diet: Heart Healthy/Diabetic diet.   Special Instructions: 1. Monitor 2-3 times a day before meals/bedtime.  2. Perform in and out cath every 4-6 hours to keep volumes less than 350 cc. Cath 4-5 times daily.  3. Contact urology office for any problems with in and out caths or urological issues.     Allergies as of 10/31/2017      Reactions   Sulfur Rash      Medication List    STOP taking these medications   feeding supplement (PRO-STAT SUGAR FREE 64) Liqd   insulin aspart 100 UNIT/ML injection Commonly known as:  novoLOG   insulin glargine 100 UNIT/ML injection Commonly known as:  LANTUS Replaced by:  Insulin Glargine 100 UNIT/ML Solostar Pen   polyethylene glycol packet Commonly known as:  MIRALAX / GLYCOLAX   spironolactone 25 MG tablet Commonly known as:  ALDACTONE     TAKE these medications   acetaminophen 325 MG tablet Commonly known as:  TYLENOL Take 1-2 tablets (325-650 mg total) by mouth every 4 (four) hours as needed for mild pain.   aspirin 81 MG tablet Take 81 mg by mouth every morning.   bethanechol 25 MG tablet Commonly known as:  URECHOLINE Take 1 tablet (25 mg total) by mouth 3 (three) times daily.   blood glucose meter kit and supplies Kit Dispense based on patient and insurance preference. Use up to four times daily as directed. (FOR ICD-10: E 11.65).   docusate sodium 100 MG capsule Commonly known as:  COLACE Take 1 capsule (100 mg total) by mouth 2 (two) times daily.   ENTRESTO 97-103 MG Generic drug:  sacubitril-valsartan Take 1 tablet by mouth 2 (two) times daily.   furosemide 40 MG tablet Commonly known as:  LASIX Take 40 mg by mouth daily as needed.   glimepiride 4 MG tablet Commonly known as:  AMARYL Take 0.5 tablets (2 mg total) by mouth every morning.   Insulin Glargine 100 UNIT/ML Solostar Pen Commonly known as:  LANTUS Inject 24 Units into the skin at bedtime. Replaces:  insulin glargine  100 UNIT/ML injection   Insulin Pen Needle 31G X 6 MM Misc Commonly known as:  CAREFINE PEN NEEDLES 1 application by Does not apply route daily.   lidocaine 2 % jelly Commonly known as:  XYLOCAINE Apply topically as needed (Use with in and out catheter).   loratadine 10 MG tablet Commonly known as:  CLARITIN Take 1 tablet (10 mg total) by mouth daily.   methocarbamol 500 MG tablet Commonly known as:  ROBAXIN Take 1 tablet (500 mg total) by mouth every 6 (six) hours as needed for muscle spasms.   metoprolol succinate 25 MG 24 hr tablet Commonly known as:  TOPROL-XL Take 1 tablet (25 mg total) by mouth every evening.   oxyCODONE 5 MG immediate release tablet--Rx # 40 pills  Commonly known as:  Oxy IR/ROXICODONE Take 1-2 tablets (5-10 mg total) by mouth every 6 (six) hours as needed for severe pain (pain score  4-6). What changed:    when to take this  reasons to take this   rosuvastatin 10 MG tablet Commonly known as:  CRESTOR Take 10 mg by mouth every evening.   sodium chloride 0.65 % Soln nasal spray Commonly known as:  OCEAN Place 1 spray into both nostrils as needed for congestion.   tamsulosin 0.4 MG Caps capsule Commonly known as:  FLOMAX Take 2 capsules (0.8 mg total) by mouth daily after supper.      Follow-up Information    Jamse Arn, MD Follow up.   Specialty:  Physical Medicine and Rehabilitation Why:  Office will call you with follow up appointment Contact information: [address] [place] [address] [phone]        [name], MD. Call in 1 day(s).   Specialty:  Orthopedic Surgery Why:  for follow appointment Contact information: [place] [address] [phone]        [name], MD Follow up on 11/09/2017.   Specialty:  Family Medicine Why:  Appointment @ 2:00 pm check in at 1:45 PM Contact information: [place] [address] [phone]         Franchot Gallo, MD Follow up on 12/04/2017.   Specialty:  Urology Why:  Be there at 10:45 am for 11:15 appointment Contact information: [place] [address] [phone]           Signed: Bary Leriche 10/31/2017, 1:01 PM

## 2017-10-31 NOTE — Discharge Instructions (Signed)
Inpatient Rehab Discharge Instructions  Brian Herring Discharge date and time:  10/31/17  Activities/Precautions/ Functional Status: Activity: no lifting, driving, or strenuous exercise till cleared by MD Diet: cardiac diet and diabetic diet Wound Care:  --Contact MD if you develop any problems with your incision/wound--redness, swelling, increase in pain, drainage or if you develop fever or chills.   Functional status:  ___ No restrictions     ___ Walk up steps independently ___ 24/7 supervision/assistance   ___ Walk up steps with assistance ___ Intermittent supervision/assistance  ___ Bathe/dress independently _X__ Walk with walker    ___ Bathe/dress with assistance ___ Walk Independently    ___ Shower independently ___ Walk with assistance    ___ Shower with assistance _X__ No alcohol     ___ Return to work/school ________   Special Instructions: 1. Check blood sugars 2-3 times a day before meals and/or at bedtime.  2. Perform in and out cath every 4-6 hours to keep volumes less than 350 cc. Cath 4-5 times daily.  3. Call urology office if you have any problems with in and out caths.    My questions have been answered and I understand these instructions. I will adhere to these goals and the provided educational materials after my discharge from the hospital.  Patient/Caregiver Signature _______________________________ Date __________  Clinician Signature _______________________________________ Date __________  Please bring this form and your medication list with you to all your follow-up doctor's appointments.

## 2017-11-02 ENCOUNTER — Telehealth: Payer: Self-pay | Admitting: Registered Nurse

## 2017-11-02 NOTE — Telephone Encounter (Signed)
Placed a call to Mr. Cookston, no answer, left message to return the call.

## 2017-11-02 NOTE — Telephone Encounter (Signed)
Transitional Care call Transitional Care Call Completed, Appointment Confirmed, Address Confirmed, New Patient Packet Mailed  Patient name: Brian Herring DOB: [birth year] 1. Are you/is patient experiencing any problems since coming home? No a. Are there any questions regarding any aspect of care? No 2. Are there any questions regarding medications administration/dosing? No a. Are meds being taken as prescribed? Yes b. "Patient should review meds with caller to confirm" Medication List Reviewed 3. Have there been any falls? No 4. Has Home Health been to the house and/or have they contacted you? Yes, [facility] scheduled to come out on Saturday 11/04/2017.  a. If not, have you tried to contact them? NA b. Can we help you contact them? NA 5. Are bowels and bladder emptying properly? Bowels moving properly, he's self  catherizing q 6-8 hours as ordered. He has a follow up appointment with Urologist on 12/04/17 a. Are there any unexpected incontinence issues? No b. If applicable, is patient following bowel/bladder programs? See above 6. Any fevers, problems with breathing, unexpected pain? No 7. Are there any skin problems or new areas of breakdown? No 8. Has the patient/family member arranged specialty MD follow up (ie cardiology/neurology/renal/surgical/etc.)?  States he will call Dr Sharol Given office in the morning, encouraged to call for appointment. a. Can we help arrange? No 9. Does the patient need any other services or support that we can help arrange? No 10. Are caregivers following through as expected in assisting the patient? Self-Care 11. Has the patient quit smoking, drinking alcohol, or using drugs as recommended? [name] denies smoking, drinking alcohol or using illicit drugs.   Appointment date/time 11/17/2017 arrival time 2:45 pm for 3:00 appointment with Dr. Posey Pronto, at [address] [phone]

## 2017-11-08 ENCOUNTER — Ambulatory Visit (INDEPENDENT_AMBULATORY_CARE_PROVIDER_SITE_OTHER): Payer: BLUE CROSS/BLUE SHIELD | Admitting: Orthopedic Surgery

## 2017-11-08 ENCOUNTER — Encounter (INDEPENDENT_AMBULATORY_CARE_PROVIDER_SITE_OTHER): Payer: Self-pay | Admitting: Orthopedic Surgery

## 2017-11-08 VITALS — Ht 72.0 in | Wt 225.0 lb

## 2017-11-08 DIAGNOSIS — Z89511 Acquired absence of right leg below knee: Secondary | ICD-10-CM

## 2017-11-08 NOTE — Progress Notes (Signed)
Office Visit Note   Patient: Brian Herring           Date of Birth: 1956-03-12           MRN: [id number] Visit Date: 11/08/2017              Requested by: [name], [place], [place] [address] PCP: [name], MD  Chief Complaint  Patient presents with  . Right Leg - Routine Post Op    10/18/17 right BKA       HPI: Patient is a 62 year old gentleman who is 3 weeks status post right transtibial amputation.  Patient states he feels much better than he did with the foot infection.  Assessment & Plan: Visit Diagnoses:  1. History of right below knee amputation [facility])     Plan: Patient will follow up with biotech for a smaller shrinker he is currently on the floor extra-large.  He will continue with a stump protector continue with extension exercises.  Follow-Up Instructions: Return in about 3 weeks (around 11/29/2017).   Ortho Exam  Patient is alert, oriented, no adenopathy, well-dressed, normal affect, normal respiratory effort. Examination patient's incision is healed nicely his residual limb is consolidating nicely there is no redness no cellulitis no drainage no signs of infection we will harvest the staples today.  Imaging: No results found. No images are attached to the encounter.  Labs: Lab Results  Component Value Date   HGBA1C 8.7 (H) 10/12/2017   HGBA1C 8.8 (H) 08/14/2015   HGBA1C 14.1 (H) 07/23/2013   ESRSEDRATE 125 (H) 10/12/2017   CRP 32.4 (H) 10/12/2017   REPTSTATUS 10/25/2017 FINAL 10/24/2017   GRAMSTAIN  10/14/2017    RARE WBC PRESENT, PREDOMINANTLY PMN FEW RARE GRAM POSITIVE COCCI IN PAIRS    CULT  10/24/2017    NO GROWTH Performed at [facility], [place] [address] [place],  [address]     [email or web address] [phone]  Body mass index is 30.52 kg/m.  Orders:  No orders of the defined types were placed in this encounter.  No orders of the defined types were placed in this encounter.    Procedures: No procedures performed  Clinical Data: No additional findings.  ROS:  All other systems negative, except as noted in the HPI. Review of Systems  Objective: Vital Signs: Ht 6' (1.829 m)   Wt 225 lb (102.1 kg)   BMI 30.52 kg/m   Specialty Comments:  No specialty comments available.  PMFS History: Patient Active Problem List   Diagnosis Date Noted  . History of right below knee amputation ([place]) 11/08/2017  . Hypoalbuminemia due to protein-calorie malnutrition ([place])   . Urinary retention   . Acute blood loss anemia   . Status post below knee amputation, right ([place]) 10/20/2017  . Unilateral complete BKA ([place]) 10/20/2017  . Sepsis ([place]) 10/12/2017  . Diabetic foot ulcer ([place]) 10/12/2017  . Diabetic foot infection ([place]) 10/12/2017  . Hyponatremia 10/12/2017  . Cellulitis of right foot 10/12/2017  . Chronic systolic CHF (congestive heart failure) ([place])   . Hyperkalemia 10/13/2015  . Congestive dilated cardiomyopathy ([place]) 07/13/2015  . Asymptomatic LV dysfunction 06/25/2015  . Poorly controlled diabetes mellitus ([place]) 05/31/2015  . HTN (hypertension), benign 07/04/2014  . Viral cardiomyopathy, EF improved to 45-50% 04/02/2013  . Obesity 04/02/2013  . Diabetic foot ulcer with osteomyelitis ([place]) 04/02/2013  . Hyperlipidemia 04/02/2013  . Sinus tachycardia 04/02/2013   Past Medical History:  Diagnosis Date  .  Chronic systolic CHF (congestive heart failure) ([place])   . Diabetes mellitus without complication ([place])   . Hyperlipidemia   . Hypertension     Family History  Problem Relation Age of Onset  . Pneumonia Mother        aspiration    Past Surgical History:  Procedure Laterality Date  . AMPUTATION Right 10/14/2017   Procedure: AMPUTATION  GREAT TOE;  Surgeon: [name], MD;  Location: [facility];  Service: Orthopedics;  Laterality: Right;  . AMPUTATION Right 10/18/2017   Procedure: RIGHT BELOW KNEE AMPUTATION;  Surgeon: [name], MD;  Location: [facility];  Service: Orthopedics;  Laterality: Right;  . SHOULDER SURGERY  01/05   Social History   Occupational History  . Not on file  Tobacco Use  . Smoking status: Never Smoker  . Smokeless tobacco: Never Used  Substance and Sexual Activity  . Alcohol use: No    Alcohol/week: 0.0 oz  . Drug use: Not on file  . Sexual activity: Yes

## 2017-11-09 ENCOUNTER — Encounter: Payer: BLUE CROSS/BLUE SHIELD | Admitting: Physical Medicine & Rehabilitation

## 2017-11-17 ENCOUNTER — Encounter
Payer: BLUE CROSS/BLUE SHIELD | Attending: Physical Medicine & Rehabilitation | Admitting: Physical Medicine & Rehabilitation

## 2017-11-17 ENCOUNTER — Encounter: Payer: Self-pay | Admitting: Physical Medicine & Rehabilitation

## 2017-11-17 VITALS — BP 123/77 | HR 90 | Ht 72.0 in | Wt 225.0 lb

## 2017-11-17 DIAGNOSIS — Z89511 Acquired absence of right leg below knee: Secondary | ICD-10-CM | POA: Diagnosis not present

## 2017-11-17 DIAGNOSIS — Z89411 Acquired absence of right great toe: Secondary | ICD-10-CM | POA: Insufficient documentation

## 2017-11-17 DIAGNOSIS — I5022 Chronic systolic (congestive) heart failure: Secondary | ICD-10-CM | POA: Insufficient documentation

## 2017-11-17 DIAGNOSIS — I110 Hypertensive heart disease with heart failure: Secondary | ICD-10-CM | POA: Diagnosis present

## 2017-11-17 DIAGNOSIS — E119 Type 2 diabetes mellitus without complications: Secondary | ICD-10-CM | POA: Diagnosis not present

## 2017-11-17 DIAGNOSIS — R339 Retention of urine, unspecified: Secondary | ICD-10-CM | POA: Diagnosis not present

## 2017-11-17 DIAGNOSIS — T8130XA Disruption of wound, unspecified, initial encounter: Secondary | ICD-10-CM | POA: Diagnosis not present

## 2017-11-17 DIAGNOSIS — E1165 Type 2 diabetes mellitus with hyperglycemia: Secondary | ICD-10-CM | POA: Diagnosis not present

## 2017-11-17 DIAGNOSIS — R269 Unspecified abnormalities of gait and mobility: Secondary | ICD-10-CM | POA: Insufficient documentation

## 2017-11-17 NOTE — Progress Notes (Signed)
Subjective:    Patient ID: Brian Herring, male    DOB: [birth year], 62 y.o.   MRN: [id number]  HPI 62 y.o. male with history of T2DM, HTN, chronic systolic CHF presents for transitional care management after receiving CIR for right BKA.  Admit date: 10/20/2017 Discharge date: 10/31/2017  At discharge, he was instructed to check CBGs, which have been relatively controlled.  He is tolerating I/O caths. He sees Urology in a month. He saw Ortho and PCP. Pain is relatively controlled.  Denies falls.  Therapies: He states never returned after he told therapies he had bed bugs DME: Shower bench, shower chair Mobility: Wheelchair most of the time, walker for bathroom.  Pain Inventory Average Pain 2 Pain Right Now 0 My pain is intermittent and tingling  In the last 24 hours, has pain interfered with the following? General activity 0 Relation with others 0 Enjoyment of life 0 What TIME of day is your pain at its worst? night Sleep (in general) Good  Pain is worse with: sitting Pain improves with: medication Relief from Meds: 9  Mobility walk with assistance use a walker ability to climb steps?  yes do you drive?  no  Function not employed: date last employed .  Neuro/Psych bladder control problems trouble walking  Prior Studies Any changes since last visit?  no  Physicians involved in your care Any changes since last visit?  no   Family History  Problem Relation Age of Onset  . Pneumonia Mother        aspiration   Social History   Socioeconomic History  . Marital status: Single    Spouse name: Not on file  . Number of children: Not on file  . Years of education: Not on file  . Highest education level: Not on file  Occupational History  . Not on file  Social Needs  . Financial resource strain: Not on file  . Food insecurity:    Worry: Not on file    Inability: Not on file  . Transportation needs:    Medical: Not on file    Non-medical: Not on file    Tobacco Use  . Smoking status: Never Smoker  . Smokeless tobacco: Never Used  Substance and Sexual Activity  . Alcohol use: No    Alcohol/week: 0.0 oz  . Drug use: Not on file  . Sexual activity: Yes  Lifestyle  . Physical activity:    Days per week: Not on file    Minutes per session: Not on file  . Stress: Not on file  Relationships  . Social connections:    Talks on phone: Not on file    Gets together: Not on file    Attends religious service: Not on file    Active member of club or organization: Not on file    Attends meetings of clubs or organizations: Not on file    Relationship status: Not on file  Other Topics Concern  . Not on file  Social History Narrative  . Not on file   Past Surgical History:  Procedure Laterality Date  . AMPUTATION Right 10/14/2017   Procedure: AMPUTATION  GREAT TOE;  Surgeon: [name], MD;  Location: [facility];  Service: Orthopedics;  Laterality: Right;  . AMPUTATION Right 10/18/2017   Procedure: RIGHT BELOW KNEE AMPUTATION;  Surgeon: [name], MD;  Location: [place];  Service: Orthopedics;  Laterality: Right;  . SHOULDER SURGERY  01/05   Past Medical History:  Diagnosis Date  . Chronic systolic CHF (congestive heart failure) ([place])   . Diabetes mellitus without complication ([place])   . Hyperlipidemia   . Hypertension    BP 123/77   Pulse 90   Ht 6' (1.829 m)   Wt 225 lb (102.1 kg)   SpO2 95%   BMI 30.52 kg/m   Opioid Risk Score:   Fall Risk Score:  `1  Depression screen PHQ 2/9  No flowsheet data found.   Review of Systems  Constitutional: Negative.   HENT: Negative.   Eyes: Negative.   Respiratory: Negative.   Cardiovascular: Negative.   Gastrointestinal: Negative.   Endocrine: Negative.   Genitourinary: Positive for decreased urine volume.  Musculoskeletal: Positive for gait problem.  Skin: Negative.   Allergic/Immunologic: Negative.   Hematological: Negative.   Psychiatric/Behavioral: Negative.   All other  systems reviewed and are negative.     Objective:   Physical Exam Gen NAD. Well-developed. HEENT: Normocephalic, atraumatic.  Cardio: RRR and no JVD Resp: CTA B/L and unlabored GI: BS positive and ND Musc/Skel:  Right BKA TTP Neuro: Alert/Oriented Motor: 4+/5 R HF  Skin:   BKA incision with dehiscence and sanguinous drainage along proximal medial aspect    Assessment & Plan:  62 y.o. male with history of T2DM, HTN, chronic systolic CHF presents for transitional care management after receiving CIR for right BKA.  1. Functional and mobility deficits secondary to right BKA    Contact therapies to resume  Follow up with Ortho  With wound dehiscence - no foul order, purulent drainage, erythema, etc  2. Pain Management  Cont wean Oxycodone, using minimally, no refills  3. T2DM  Relatively controlled at present  Cont to monitor  Cont meds  Follow up with PCP  4.  Urinary retention  Cont I/O caths  Follow up with Urology, appointment next month  5. Gait abnormality  Cont wheelchair/walker for safety  Cont therapies to resume  Meds reviewed Referrals reviewed All questions answere

## 2017-11-29 ENCOUNTER — Ambulatory Visit (INDEPENDENT_AMBULATORY_CARE_PROVIDER_SITE_OTHER): Payer: BLUE CROSS/BLUE SHIELD | Admitting: Orthopedic Surgery

## 2017-12-11 ENCOUNTER — Ambulatory Visit (INDEPENDENT_AMBULATORY_CARE_PROVIDER_SITE_OTHER): Payer: BLUE CROSS/BLUE SHIELD | Admitting: Orthopedic Surgery

## 2017-12-11 ENCOUNTER — Encounter (INDEPENDENT_AMBULATORY_CARE_PROVIDER_SITE_OTHER): Payer: Self-pay | Admitting: Orthopedic Surgery

## 2017-12-11 DIAGNOSIS — T8781 Dehiscence of amputation stump: Secondary | ICD-10-CM

## 2017-12-11 NOTE — Progress Notes (Signed)
Office Visit Note   Patient: Brian Herring           Date of Birth: 06-20-1956           MRN: [id number] Visit Date: 12/11/2017              Requested by: [name], [place], [place] [address] PCP: [name], MD  Chief Complaint  Patient presents with  . Right Leg - Follow-up, Routine Post Op      HPI: Patient is a 62 year old gentleman who is status post right transtibial amputation.  He is completed his incisional wound VAC he is currently using a stump shrinker and a limb protector.  He is 2 months out from surgery.  Assessment & Plan: Visit Diagnoses:  1. Dehiscence of amputation stump (HCC)     Plan: Due to the large hematoma medially we will need to proceed with revision of the amputation.  We will patient could proceed with outpatient surgery with a application of a Praveena wound VAC with discharge after surgery.  Discussed that he would need a driver and somebody stay with him overnight.  Follow-Up Instructions: Return in about 1 week (around 12/18/2017).   Ortho Exam  Patient is alert, oriented, no adenopathy, well-dressed, normal affect, normal respiratory effort. Examination patient's incision is healing nicely and consolidating well he does have a hematoma medially.  After debridement the hematoma is 2 cm in diameter and 2 cm deep.  This appears large enough that healing with secondary intention would take a long period of time and feel that a revision of this area of the amputation should heal quickly and allow him to be fit with a prosthesis quickly.  Imaging: No results found. No images are attached to the encounter.  Labs: Lab Results  Component Value Date   HGBA1C 8.7 (H) 10/12/2017   HGBA1C 8.8 (H) 08/14/2015   HGBA1C 14.1 (H) 07/23/2013   ESRSEDRATE 125 (H) 10/12/2017   CRP 32.4 (H) 10/12/2017   REPTSTATUS 10/25/2017 FINAL 10/24/2017   GRAMSTAIN  10/14/2017    RARE WBC PRESENT, PREDOMINANTLY PMN FEW RARE  GRAM POSITIVE COCCI IN PAIRS    CULT  10/24/2017    NO GROWTH Performed at [facility], [place] [address], [place], [place] [address]      Lab Results  Component Value Date   ALBUMIN 2.3 (L) 10/21/2017   ALBUMIN 3.4 (L) 10/12/2017   ALBUMIN 4.4 09/21/2015   PREALBUMIN <5 (L) 10/12/2017    There is no height or weight on file to calculate BMI.  Orders:  No orders of the defined types were placed in this encounter.  No orders of the defined types were placed in this encounter.    Procedures: No procedures performed  Clinical Data: No additional findings.  ROS:  All other systems negative, except as noted in the HPI. Review of Systems  Objective: Vital Signs: There were no vitals taken for this visit.  Specialty Comments:  No specialty comments available.  PMFS History: Patient Active Problem List   Diagnosis Date Noted  . History of right below knee amputation ([place]) 11/08/2017  . Hypoalbuminemia due to protein-calorie malnutrition ([place])   . Urinary retention   . Acute blood loss anemia   . Status post below knee amputation, right ([place]) 10/20/2017  . Unilateral complete BKA ([place]) 10/20/2017  . Sepsis ([place]) 10/12/2017  . Diabetic foot ulcer ([place]) 10/12/2017  . Diabetic foot infection ([place]) 10/12/2017  .  Hyponatremia 10/12/2017  . Cellulitis of right foot 10/12/2017  . Chronic systolic CHF (congestive heart failure) ([place])   . Hyperkalemia 10/13/2015  . Congestive dilated cardiomyopathy ([place]) 07/13/2015  . Asymptomatic LV dysfunction 06/25/2015  . Poorly controlled diabetes mellitus ([place]) 05/31/2015  . HTN (hypertension), benign 07/04/2014  . Viral cardiomyopathy, EF improved to 45-50% 04/02/2013  . Obesity 04/02/2013  . Diabetic foot ulcer with osteomyelitis ([place]) 04/02/2013  . Hyperlipidemia 04/02/2013  . Sinus tachycardia 04/02/2013   Past Medical History:  Diagnosis Date  . Chronic systolic CHF (congestive heart failure) ([place])   . Diabetes  mellitus without complication ([place])   . Hyperlipidemia   . Hypertension     Family History  Problem Relation Age of Onset  . Pneumonia Mother        aspiration    Past Surgical History:  Procedure Laterality Date  . AMPUTATION Right 10/14/2017   Procedure: AMPUTATION  GREAT TOE;  Surgeon: [name], MD;  Location: [facility];  Service: Orthopedics;  Laterality: Right;  . AMPUTATION Right 10/18/2017   Procedure: RIGHT BELOW KNEE AMPUTATION;  Surgeon: [name], MD;  Location: [facility];  Service: Orthopedics;  Laterality: Right;  . SHOULDER SURGERY  01/05   Social History   Occupational History  . Not on file  Tobacco Use  . Smoking status: Never Smoker  . Smokeless tobacco: Never Used  Substance and Sexual Activity  . Alcohol use: No    Alcohol/week: 0.0 oz  . Drug use: Not on file  . Sexual activity: Yes

## 2017-12-12 ENCOUNTER — Telehealth (INDEPENDENT_AMBULATORY_CARE_PROVIDER_SITE_OTHER): Payer: Self-pay | Admitting: Orthopedic Surgery

## 2017-12-12 NOTE — Telephone Encounter (Signed)
[name] from [facility] called to let you know that she went to see the patient today and the patient told her that Dr. Sharol Given told him to pack it and use a dry dressing.  She needs a VO for those instructions.  CB#[phone].  Thank you.

## 2017-12-13 ENCOUNTER — Other Ambulatory Visit (INDEPENDENT_AMBULATORY_CARE_PROVIDER_SITE_OTHER): Payer: Self-pay | Admitting: Orthopedic Surgery

## 2017-12-13 DIAGNOSIS — I96 Gangrene, not elsewhere classified: Secondary | ICD-10-CM

## 2017-12-13 NOTE — Telephone Encounter (Signed)
I called and lm on vm to advise that the pt can apply dry dressing and that he is sch for a revision of his right BKA on Friday. He will do this as an out pt and have a prevena vac placed which he will leave in place for 7 days until his first post op and then we can update orders at that time. To call with any questions.

## 2017-12-14 ENCOUNTER — Other Ambulatory Visit: Payer: Self-pay

## 2017-12-14 ENCOUNTER — Encounter (HOSPITAL_COMMUNITY): Payer: Self-pay | Admitting: *Deleted

## 2017-12-14 NOTE — Progress Notes (Signed)
Spoke with pt for pre-op call. Pt states he had a virus to get into his heart in 1999 and was diagnosed with cardiomyopathy. Pt states his cardiologist is Dr. Terrence Dupont. Pt states he had a cath in 1999, but has not had one since. Pt denies any chest pain or sob. Pt is a type 2 diabetic. Last A1C was 8.7 on 10/12/17. Pt states his fasting blood sugar is usually around the 120's, no higher than the 180's. Pt instructed to take 1/2 of his regular dose of Lantus Insulin tonight (will take 12 units). Instructed pt to check his blood sugar in the AM when he gets up and every 2 hours until he leaves for the hospital. If blood sugar is 70 or below, treat with 1/2 cup of clear juice (apple or cranberry) and recheck blood sugar 15 minutes after drinking juice. If blood sugar continues to be 70 or below, call the Short Stay department and ask to speak to a nurse. Pt voiced understanding.

## 2017-12-14 NOTE — Progress Notes (Signed)
Anesthesia Chart Review:   Pt is a same day work up   Case:  [id number] Date/Time:  12/15/17 0942   Procedure:  REVISION RIGHT BELOW KNEE AMPUTATION (Right )   Anesthesia type:  Choice   Pre-op diagnosis:  Dehiscence Right Below Knee Amputation   Location:  [facility] OR ROOM 07 / [facility]   Surgeon:  [name], MD      DISCUSSION: - Pt is a 62 year old male with hx nonischemic cardiomyopathy (viral dx 1999), EF 25-30% (by 2017 echo)  - Hospitalized 3/21-3/29/19 for foot ulcer with osteomyelitis, sepsis.  S/p R 1st and 2nd ray amputation 10/14/17; s/p R BKA 10/18/17  - Pt has not had regular cardiology follow up since last visit 10/13/15  - S/p R BKA 10/18/17    PROVIDERS: PCP is [name], MD Cardiologist used to be Shelva Majestic, MD. Last office visit 10/13/15   LABS: Will be obtained day of surgery   EKG 10/12/17: Sinus tachycardia (108 bpm). Incomplete LBBB   CV:  Echo 09/02/15:  - Left ventricle: Inferior wall akinetic diffuse hypokinesis Abnormal septal motion The cavity size was moderately dilated. Wall thickness was normal. Systolic function was severely reduced. The estimated ejection fraction was in the range of 25% to 30%. - Left atrium: The atrium was mildly dilated. - Atrial septum: No defect or patent foramen ovale was identified.  Nuclear stress test 07/03/15:  1. Low risk study 2. Diaphragmatic attenuation 3. Severe LV dysfunction c/w NISCM   Past Medical History:  Diagnosis Date  . Arthritis    shoulder  . Cancer ([place])    skin cancer on nose  . Cardiomyopathy ([place])   . Chronic systolic CHF (congestive heart failure) ([place])   . Diabetes mellitus without complication ([place])   . Hyperlipidemia   . Hypertension     Past Surgical History:  Procedure Laterality Date  . AMPUTATION Right 10/14/2017   Procedure: AMPUTATION  GREAT TOE;  Surgeon: [name], MD;  Location: [facility];  Service: Orthopedics;  Laterality: Right;  . AMPUTATION Right 10/18/2017   Procedure: RIGHT BELOW KNEE AMPUTATION;  Surgeon: [name], MD;  Location: [facility];  Service: Orthopedics;  Laterality: Right;  . SHOULDER SURGERY  01/05    MEDICATIONS: No current facility-administered medications for this encounter.    Marland Kitchen acetaminophen (TYLENOL) 325 MG tablet  . aspirin 81 MG tablet  . bethanechol (URECHOLINE) 25 MG tablet  . furosemide (LASIX) 40 MG tablet  . glimepiride (AMARYL) 4 MG tablet  . Insulin Glargine (LANTUS) 100 UNIT/ML Solostar Pen  . methocarbamol (ROBAXIN) 500 MG tablet  . metoprolol succinate (TOPROL-XL) 25 MG 24 hr tablet  . oxyCODONE (OXY IR/ROXICODONE) 5 MG immediate release tablet  . rosuvastatin (CRESTOR) 10 MG tablet  . sacubitril-valsartan (ENTRESTO) 97-103 MG  . tamsulosin (FLOMAX) 0.4 MG CAPS capsule  . blood glucose meter kit and supplies KIT  . docusate sodium (COLACE) 100 MG capsule  . Insulin Pen Needle (CAREFINE PEN NEEDLES) 31G X 6 MM MISC    Pt will need further assessment by assigned anesthesiologist day of surgery.  If no acute CV symptoms and labs acceptable day of surgery, I anticipate pt can proceed with surgery as scheduled.  Willeen Cass, FNP-BC [facility] Stay [facility]/Anesthesiology Phone: [phone] 12/14/2017 2:32 PM

## 2017-12-14 NOTE — Progress Notes (Signed)
Pt's cardiologist is Dr. Vernell Leep, I had misunderstood pt earlier today and called him back to find out the correct name. I have requested last office visit notes and any recent heart studies.

## 2017-12-15 ENCOUNTER — Encounter (HOSPITAL_COMMUNITY)
Admission: RE | Disposition: A | Discharge status: Home / Self Care | Payer: Self-pay | Source: Ambulatory Visit | Attending: Orthopedic Surgery

## 2017-12-15 ENCOUNTER — Ambulatory Visit (HOSPITAL_COMMUNITY): Payer: BLUE CROSS/BLUE SHIELD | Admitting: Emergency Medicine

## 2017-12-15 ENCOUNTER — Ambulatory Visit (HOSPITAL_COMMUNITY)
Admission: RE | Admit: 2017-12-15 | Discharge: 2017-12-15 | Disposition: A | Discharge status: Home / Self Care | Payer: BLUE CROSS/BLUE SHIELD | Source: Ambulatory Visit | Attending: Orthopedic Surgery | Admitting: Orthopedic Surgery

## 2017-12-15 ENCOUNTER — Encounter (HOSPITAL_COMMUNITY): Payer: Self-pay | Admitting: Surgery

## 2017-12-15 DIAGNOSIS — Z882 Allergy status to sulfonamides status: Secondary | ICD-10-CM | POA: Insufficient documentation

## 2017-12-15 DIAGNOSIS — T8781 Dehiscence of amputation stump: Secondary | ICD-10-CM | POA: Insufficient documentation

## 2017-12-15 DIAGNOSIS — I429 Cardiomyopathy, unspecified: Secondary | ICD-10-CM | POA: Diagnosis not present

## 2017-12-15 DIAGNOSIS — I96 Gangrene, not elsewhere classified: Secondary | ICD-10-CM

## 2017-12-15 DIAGNOSIS — I255 Ischemic cardiomyopathy: Secondary | ICD-10-CM | POA: Insufficient documentation

## 2017-12-15 DIAGNOSIS — I5022 Chronic systolic (congestive) heart failure: Secondary | ICD-10-CM | POA: Diagnosis not present

## 2017-12-15 DIAGNOSIS — Z794 Long term (current) use of insulin: Secondary | ICD-10-CM | POA: Diagnosis not present

## 2017-12-15 DIAGNOSIS — E785 Hyperlipidemia, unspecified: Secondary | ICD-10-CM | POA: Insufficient documentation

## 2017-12-15 DIAGNOSIS — E119 Type 2 diabetes mellitus without complications: Secondary | ICD-10-CM | POA: Diagnosis not present

## 2017-12-15 DIAGNOSIS — Z89411 Acquired absence of right great toe: Secondary | ICD-10-CM | POA: Diagnosis not present

## 2017-12-15 DIAGNOSIS — Z79899 Other long term (current) drug therapy: Secondary | ICD-10-CM | POA: Insufficient documentation

## 2017-12-15 DIAGNOSIS — I110 Hypertensive heart disease with heart failure: Secondary | ICD-10-CM | POA: Insufficient documentation

## 2017-12-15 DIAGNOSIS — Z85828 Personal history of other malignant neoplasm of skin: Secondary | ICD-10-CM | POA: Insufficient documentation

## 2017-12-15 DIAGNOSIS — X58XXXA Exposure to other specified factors, initial encounter: Secondary | ICD-10-CM | POA: Insufficient documentation

## 2017-12-15 DIAGNOSIS — M19019 Primary osteoarthritis, unspecified shoulder: Secondary | ICD-10-CM | POA: Diagnosis not present

## 2017-12-15 DIAGNOSIS — Z7982 Long term (current) use of aspirin: Secondary | ICD-10-CM | POA: Insufficient documentation

## 2017-12-15 DIAGNOSIS — Z836 Family history of other diseases of the respiratory system: Secondary | ICD-10-CM | POA: Insufficient documentation

## 2017-12-15 DIAGNOSIS — Z888 Allergy status to other drugs, medicaments and biological substances status: Secondary | ICD-10-CM | POA: Diagnosis not present

## 2017-12-15 HISTORY — DX: Cardiomyopathy, unspecified: I42.9

## 2017-12-15 HISTORY — DX: Malignant (primary) neoplasm, unspecified: C80.1

## 2017-12-15 HISTORY — PX: STUMP REVISION: SHX6102

## 2017-12-15 HISTORY — DX: Unspecified osteoarthritis, unspecified site: M19.90

## 2017-12-15 LAB — HEMOGLOBIN A1C
HEMOGLOBIN A1C: 6.9 % — AB (ref 4.8–5.6)
Mean Plasma Glucose: 151.33 mg/dL

## 2017-12-15 LAB — CBC
HEMATOCRIT: 40 % (ref 39.0–52.0)
HEMOGLOBIN: 12.8 g/dL — AB (ref 13.0–17.0)
MCH: 28.4 pg (ref 26.0–34.0)
MCHC: 32 g/dL (ref 30.0–36.0)
MCV: 88.7 fL (ref 78.0–100.0)
Platelets: 308 10*3/uL (ref 150–400)
RBC: 4.51 MIL/uL (ref 4.22–5.81)
RDW: 14.3 % (ref 11.5–15.5)
WBC: 14.4 10*3/uL — ABNORMAL HIGH (ref 4.0–10.5)

## 2017-12-15 LAB — BASIC METABOLIC PANEL
ANION GAP: 8 (ref 5–15)
BUN: 20 mg/dL (ref 6–20)
CALCIUM: 9.8 mg/dL (ref 8.9–10.3)
CHLORIDE: 102 mmol/L (ref 101–111)
CO2: 29 mmol/L (ref 22–32)
Creatinine, Ser: 0.84 mg/dL (ref 0.61–1.24)
GFR calc non Af Amer: >60 mL/min (ref >60)
GLUCOSE: 147 mg/dL — AB (ref 65–99)
POTASSIUM: 4.3 mmol/L (ref 3.5–5.1)
Sodium: 139 mmol/L (ref 135–145)

## 2017-12-15 LAB — GLUCOSE, CAPILLARY
GLUCOSE-CAPILLARY: 139 mg/dL — AB (ref 65–99)
Glucose-Capillary: 134 mg/dL — ABNORMAL HIGH (ref 65–99)

## 2017-12-15 SURGERY — REVISION, AMPUTATION SITE
Anesthesia: General | Laterality: Right

## 2017-12-15 MED ORDER — HYDROCODONE-ACETAMINOPHEN 7.5-325 MG PO TABS
ORAL_TABLET | ORAL | Status: AC
  Filled 2017-12-15: qty 1

## 2017-12-15 MED ORDER — LACTATED RINGERS IV SOLN
INTRAVENOUS | Status: DC
  Administered 2017-12-15: 08:00:00 via INTRAVENOUS

## 2017-12-15 MED ORDER — PROMETHAZINE HCL 25 MG/ML IJ SOLN: 6.2500 mg | INTRAMUSCULAR | Status: DC | PRN

## 2017-12-15 MED ORDER — FENTANYL CITRATE (PF) 250 MCG/5ML IJ SOLN
INTRAMUSCULAR | Status: AC
  Filled 2017-12-15: qty 5

## 2017-12-15 MED ORDER — MIDAZOLAM HCL 5 MG/5ML IJ SOLN
INTRAMUSCULAR | Status: DC | PRN
  Administered 2017-12-15: 2 mg via INTRAVENOUS

## 2017-12-15 MED ORDER — ONDANSETRON HCL 4 MG/2ML IJ SOLN
INTRAMUSCULAR | Status: DC | PRN
  Administered 2017-12-15: 4 mg via INTRAVENOUS

## 2017-12-15 MED ORDER — MEPERIDINE HCL 50 MG/ML IJ SOLN: 6.2500 mg | INTRAMUSCULAR | Status: DC | PRN

## 2017-12-15 MED ORDER — PROPOFOL 500 MG/50ML IV EMUL
INTRAVENOUS | Status: DC | PRN
  Administered 2017-12-15: 100 ug/kg/min via INTRAVENOUS

## 2017-12-15 MED ORDER — HYDROMORPHONE HCL 2 MG/ML IJ SOLN
0.2500 mg | INTRAMUSCULAR | Status: DC | PRN
  Administered 2017-12-15 (×2): 0.5 mg via INTRAVENOUS

## 2017-12-15 MED ORDER — PROPOFOL 10 MG/ML IV BOLUS
INTRAVENOUS | Status: DC | PRN
  Administered 2017-12-15: 30 mg via INTRAVENOUS
  Administered 2017-12-15: 70 mg via INTRAVENOUS

## 2017-12-15 MED ORDER — ONDANSETRON HCL 4 MG/2ML IJ SOLN
INTRAMUSCULAR | Status: AC
  Filled 2017-12-15: qty 2

## 2017-12-15 MED ORDER — ACETAMINOPHEN 10 MG/ML IV SOLN: 1000.0000 mg | Freq: Once | INTRAVENOUS | Status: DC | PRN

## 2017-12-15 MED ORDER — CHLORHEXIDINE GLUCONATE 4 % EX LIQD: 60.0000 mL | Freq: Once | CUTANEOUS | Status: DC

## 2017-12-15 MED ORDER — DEXAMETHASONE SODIUM PHOSPHATE 10 MG/ML IJ SOLN
INTRAMUSCULAR | Status: AC
  Filled 2017-12-15: qty 1

## 2017-12-15 MED ORDER — HYDROMORPHONE HCL 2 MG/ML IJ SOLN
INTRAMUSCULAR | Status: AC
  Filled 2017-12-15: qty 1

## 2017-12-15 MED ORDER — PHENYLEPHRINE 40 MCG/ML (10ML) SYRINGE FOR IV PUSH (FOR BLOOD PRESSURE SUPPORT)
PREFILLED_SYRINGE | INTRAVENOUS | Status: DC | PRN
  Administered 2017-12-15 (×3): 80 ug via INTRAVENOUS

## 2017-12-15 MED ORDER — FENTANYL CITRATE (PF) 250 MCG/5ML IJ SOLN
INTRAMUSCULAR | Status: DC | PRN
  Administered 2017-12-15: 50 ug via INTRAVENOUS
  Administered 2017-12-15: 100 ug via INTRAVENOUS
  Administered 2017-12-15: 50 ug via INTRAVENOUS

## 2017-12-15 MED ORDER — 0.9 % SODIUM CHLORIDE (POUR BTL) OPTIME
TOPICAL | Status: DC | PRN
  Administered 2017-12-15: 1000 mL

## 2017-12-15 MED ORDER — MIDAZOLAM HCL 2 MG/2ML IJ SOLN
INTRAMUSCULAR | Status: AC
  Filled 2017-12-15: qty 2

## 2017-12-15 MED ORDER — CEFAZOLIN SODIUM-DEXTROSE 2-4 GM/100ML-% IV SOLN
2.0000 g | INTRAVENOUS | Status: AC
  Administered 2017-12-15: 2 g via INTRAVENOUS
  Filled 2017-12-15: qty 100

## 2017-12-15 MED ORDER — HYDROCODONE-ACETAMINOPHEN 7.5-325 MG PO TABS
1.0000 | ORAL_TABLET | Freq: Once | ORAL | Status: AC | PRN
  Administered 2017-12-15: 1 via ORAL

## 2017-12-15 MED ORDER — DEXAMETHASONE SODIUM PHOSPHATE 10 MG/ML IJ SOLN
INTRAMUSCULAR | Status: DC | PRN
  Administered 2017-12-15: 5 mg via INTRAVENOUS

## 2017-12-15 SURGICAL SUPPLY — 32 items
BLADE SAW RECIP 87.9 MT (BLADE) IMPLANT
BLADE SURG 21 STRL SS (BLADE) ×3 IMPLANT
COVER SURGICAL LIGHT HANDLE (MISCELLANEOUS) ×3 IMPLANT
DRAPE EXTREMITY T 121X128X90 (DRAPE) ×3 IMPLANT
DRAPE HALF SHEET 40X57 (DRAPES) ×3 IMPLANT
DRAPE INCISE IOBAN 66X45 STRL (DRAPES) ×3 IMPLANT
DRAPE U-SHAPE 47X51 STRL (DRAPES) ×6 IMPLANT
DRESSING PREVENA PLUS CUSTOM (GAUZE/BANDAGES/DRESSINGS) IMPLANT
DRSG PREVENA PLUS CUSTOM (GAUZE/BANDAGES/DRESSINGS) ×3
DRSG VAC ATS LRG SENSATRAC (GAUZE/BANDAGES/DRESSINGS) ×2 IMPLANT
DRSG VAC ATS MED SENSATRAC (GAUZE/BANDAGES/DRESSINGS) ×3 IMPLANT
DURAPREP 26ML APPLICATOR (WOUND CARE) ×3 IMPLANT
ELECT REM PT RETURN 9FT ADLT (ELECTROSURGICAL) ×3
ELECTRODE REM PT RTRN 9FT ADLT (ELECTROSURGICAL) ×1 IMPLANT
GLOVE BIOGEL PI IND STRL 9 (GLOVE) ×1 IMPLANT
GLOVE BIOGEL PI INDICATOR 9 (GLOVE) ×2
GLOVE SURG ORTHO 9.0 STRL STRW (GLOVE) ×3 IMPLANT
GOWN STRL REUS W/ TWL XL LVL3 (GOWN DISPOSABLE) ×2 IMPLANT
GOWN STRL REUS W/TWL XL LVL3 (GOWN DISPOSABLE) ×6
KIT BASIN OR (CUSTOM PROCEDURE TRAY) ×3 IMPLANT
KIT DRSG PREVENA PLUS 7DAY 125 (MISCELLANEOUS) ×2 IMPLANT
KIT TURNOVER KIT B (KITS) ×3 IMPLANT
MANIFOLD NEPTUNE II (INSTRUMENTS) ×3 IMPLANT
NS IRRIG 1000ML POUR BTL (IV SOLUTION) ×3 IMPLANT
PACK GENERAL/GYN (CUSTOM PROCEDURE TRAY) ×3 IMPLANT
PAD ARMBOARD 7.5X6 YLW CONV (MISCELLANEOUS) ×3 IMPLANT
PAD NEG PRESSURE SENSATRAC (MISCELLANEOUS) IMPLANT
STAPLER VISISTAT 35W (STAPLE) IMPLANT
SUT ETHILON 2 0 PSLX (SUTURE) ×6 IMPLANT
SUT SILK 2 0 (SUTURE)
SUT SILK 2-0 18XBRD TIE 12 (SUTURE) IMPLANT
TOWEL OR 17X26 10 PK STRL BLUE (TOWEL DISPOSABLE) ×3 IMPLANT

## 2017-12-15 NOTE — Transfer of Care (Signed)
Immediate Anesthesia Transfer of Care Note  Patient: Brian Herring  Procedure(s) Performed: REVISION RIGHT BELOW KNEE AMPUTATION (Right )  Patient Location: PACU  Anesthesia Type:General  Level of Consciousness: drowsy and patient cooperative  Airway & Oxygen Therapy: Patient Spontanous Breathing and Patient connected to face mask oxygen  Post-op Assessment: Report given to RN, Post -op Vital signs reviewed and stable and Patient moving all extremities X 4  Post vital signs: Reviewed and stable  Last Vitals:  Vitals Value Taken Time  BP 91/59 12/15/2017 10:04 AM  Temp    Pulse 95 12/15/2017 10:06 AM  Resp 16 12/15/2017 10:06 AM  SpO2 99 % 12/15/2017 10:06 AM  Vitals shown include unvalidated device data.  Last Pain:  Vitals:   12/15/17 0810  TempSrc:   PainSc: 0-No pain      Patients Stated Pain Goal: 3 (24/49/75 3005)  Complications: No apparent anesthesia complications

## 2017-12-15 NOTE — Anesthesia Preprocedure Evaluation (Addendum)
Anesthesia Evaluation  Patient identified by MRN, date of birth, ID band Patient awake    Reviewed: Allergy & Precautions, NPO status , Patient's Chart, lab work & pertinent test results  Airway Mallampati: II  TM Distance: >3 FB Neck ROM: Full    Dental no notable dental hx.    Pulmonary neg pulmonary ROS,    Pulmonary exam normal breath sounds clear to auscultation       Cardiovascular hypertension, Pt. on home beta blockers and Pt. on medications +CHF  negative cardio ROS Normal cardiovascular exam Rhythm:Regular Rate:Normal  LV EF: 25% -   30%  ------------------------------------------------------------------- Indications:      Ischemic cardiomyopathy (I25.5).   Neuro/Psych negative neurological ROS     GI/Hepatic negative GI ROS, Neg liver ROS,   Endo/Other  diabetes  Renal/GU negative Renal ROS  negative genitourinary   Musculoskeletal negative musculoskeletal ROS (+)   Abdominal   Peds negative pediatric ROS (+)  Hematology negative hematology ROS (+)   Anesthesia Other Findings   Reproductive/Obstetrics                             Anesthesia Physical Anesthesia Plan  ASA: V  Anesthesia Plan: General   Post-op Pain Management:    Induction: Intravenous  PONV Risk Score and Plan:   Airway Management Planned: LMA  Additional Equipment:   Intra-op Plan:   Post-operative Plan: Extubation in OR  Informed Consent: I have reviewed the patients History and Physical, chart, labs and discussed the procedure including the risks, benefits and alternatives for the proposed anesthesia with the patient or authorized representative who has indicated his/her understanding and acceptance.   Dental advisory given  Plan Discussed with: CRNA  Anesthesia Plan Comments:         Anesthesia Quick Evaluation

## 2017-12-15 NOTE — Op Note (Signed)
12/15/2017  10:01 AM  PATIENT:  Brian Herring    PRE-OPERATIVE DIAGNOSIS:  Dehiscence Right Below Knee Amputation  POST-OPERATIVE DIAGNOSIS:  Same  PROCEDURE:  REVISION RIGHT BELOW KNEE AMPUTATION, application Praveena wound VAC.  SURGEON:  [name], MD  PHYSICIAN ASSISTANT:None ANESTHESIA:   General  PREOPERATIVE INDICATIONS:  Brian Herring is a  62 y.o. male with a diagnosis of Dehiscence Right Below Knee Amputation who failed conservative measures and elected for surgical management.    The risks benefits and alternatives were discussed with the patient preoperatively including but not limited to the risks of infection, bleeding, nerve injury, cardiopulmonary complications, the need for revision surgery, among others, and the patient was willing to proceed.  OPERATIVE IMPLANTS: Praveena wound VAC  @ENCIMAGES @  OPERATIVE FINDINGS: Ischemic muscle anterior compartment  OPERATIVE PROCEDURE: Patient was brought the operating room and underwent a general anesthetic.  After adequate levels of anesthesia were obtained patient's right lower extremity was prepped using DuraPrep draped in the sterile field a timeout was called.  Elliptical incision was made around the ulcerative tissue over the transtibial amputation.  1 cm the distal tibia was resected and beveled anteriorly.  The entire anterior compartment was necrotic this was resected.  All margins were resected to healthy viable muscle that had good color good contractility and good consistency.  The wound was irrigated with normal saline electrocautery was used for hemostasis.  The wound was closed using 2-0 nylon to close the deep superficial fascial layers and skin.  A Praveena wound VAC was applied this had a good suction fit patient was extubated taken the PACU in stable condition.   DISCHARGE PLANNING:  Antibiotic duration: Perioperative preop  Weightbearing: Nonweightbearing on the right  Pain medication: Patient has  Percocet and Robaxin at home  Dressing care/ Wound VAC: Wound VAC to be kept for 1 week patient to use the stump shrinker on top of the wound VAC  Ambulatory devices: Patient is amatory devices at home  Discharge to: To home  Follow-up: In the office 1 week post operative.

## 2017-12-15 NOTE — Anesthesia Procedure Notes (Signed)
Procedure Name: LMA Insertion Date/Time: 12/15/2017 9:38 AM Performed by: Freddie Breech, CRNA Pre-anesthesia Checklist: Patient identified, Emergency Drugs available, Suction available and Patient being monitored Patient Re-evaluated:Patient Re-evaluated prior to induction Oxygen Delivery Method: Circle System Utilized Preoxygenation: Pre-oxygenation with 100% oxygen Induction Type: IV induction Ventilation: Mask ventilation without difficulty LMA: LMA inserted LMA Size: 5.0 Number of attempts: 1 Airway Equipment and Method: Bite block Placement Confirmation: positive ETCO2 Tube secured with: Tape Dental Injury: Teeth and Oropharynx as per pre-operative assessment

## 2017-12-15 NOTE — H&P (Signed)
Brian Herring is an 62 y.o. male.   Chief Complaint: Dehiscence right transtibial amputation. HPI: Patient is 2 months out from a right transtibial amputation.  There is a large wound with dehiscence.  The wound is 2 x 2 cm and extends down to bone.  Patient is undergone conservative wound care without resolution of the ulcer.  Past Medical History:  Diagnosis Date  . Arthritis    shoulder  . Cancer ([place])    skin cancer on nose  . Cardiomyopathy ([place])   . Chronic systolic CHF (congestive heart failure) ([place])   . Diabetes mellitus without complication ([place])   . Hyperlipidemia   . Hypertension     Past Surgical History:  Procedure Laterality Date  . AMPUTATION Right 10/14/2017   Procedure: AMPUTATION  GREAT TOE;  Surgeon: [name], MD;  Location: [facility];  Service: Orthopedics;  Laterality: Right;  . AMPUTATION Right 10/18/2017   Procedure: RIGHT BELOW KNEE AMPUTATION;  Surgeon: [name], MD;  Location: [facility];  Service: Orthopedics;  Laterality: Right;  . SHOULDER SURGERY  01/05    Family History  Problem Relation Age of Onset  . Pneumonia Mother        aspiration   Social History:  reports that he has never smoked. He has never used smokeless tobacco. He reports that he does not drink alcohol or use drugs.  Allergies:  Allergies  Allergen Reactions  . Sulfur Rash  . Norvasc [Amlodipine Besylate] Rash    Medications Prior to Admission  Medication Sig Dispense Refill  . acetaminophen (TYLENOL) 325 MG tablet Take 1-2 tablets (325-650 mg total) by mouth every 4 (four) hours as needed for mild pain.    Marland Kitchen aspirin 81 MG tablet Take 81 mg by mouth every morning.     . bethanechol (URECHOLINE) 25 MG tablet Take 1 tablet (25 mg total) by mouth 3 (three) times daily. 90 tablet 0  . furosemide (LASIX) 40 MG tablet Take 40 mg by mouth daily as needed for fluid or edema.     Marland Kitchen glimepiride (AMARYL) 4 MG tablet Take 0.5 tablets (2 mg total) by mouth every morning. (Patient  taking differently: Take 4 mg by mouth every morning. ) 30 tablet 11  . Insulin Glargine (LANTUS) 100 UNIT/ML Solostar Pen Inject 24 Units into the skin at bedtime. 15 mL 0  . methocarbamol (ROBAXIN) 500 MG tablet Take 1 tablet (500 mg total) by mouth every 6 (six) hours as needed for muscle spasms. 60 tablet 0  . metoprolol succinate (TOPROL-XL) 25 MG 24 hr tablet Take 1 tablet (25 mg total) by mouth every evening. 30 tablet 0  . oxyCODONE (OXY IR/ROXICODONE) 5 MG immediate release tablet Take 1-2 tablets (5-10 mg total) by mouth every 6 (six) hours as needed for severe pain (pain score 4-6). 40 tablet 0  . rosuvastatin (CRESTOR) 10 MG tablet Take 10 mg by mouth every evening.     . sacubitril-valsartan (ENTRESTO) 97-103 MG Take 1 tablet by mouth 2 (two) times daily.    . tamsulosin (FLOMAX) 0.4 MG CAPS capsule Take 2 capsules (0.8 mg total) by mouth daily after supper. 90 capsule 0  . blood glucose meter kit and supplies KIT Dispense based on patient and insurance preference. Use up to four times daily as directed. (FOR ICD-10: E 11.65). 1 each 0  . docusate sodium (COLACE) 100 MG capsule Take 1 capsule (100 mg total) by mouth 2 (two) times daily. (Patient not taking: Reported  on 12/13/2017) 60 capsule 0  . Insulin Pen Needle (CAREFINE PEN NEEDLES) 31G X 6 MM MISC 1 application by Does not apply route daily. 100 each 0    No results found for this or any previous visit (from the past 48 hour(s)). No results found.  Review of Systems  All other systems reviewed and are negative.   There were no vitals taken for this visit. Physical Exam  Patient is alert, oriented, no adenopathy, well-dressed, normal affect, normal respiratory effort. Examination patient's incision is healing nicely and consolidating well he does have a hematoma medially.  After debridement the hematoma is 2 cm in diameter and 2 cm deep.  This appears large enough that healing with secondary intention would take a long period  of time and feel that a revision of this area of the amputation should heal quickly and allow him to be fit with a prosthesis quickly.   Assessment/Plan 1. Dehiscence of amputation stump (HCC)     Plan: Due to the large hematoma medially we will need to proceed with revision of the amputation.  We will patient could proceed with outpatient surgery with a application of a Praveena wound VAC with discharge after surgery.  Discussed that he would need a driver and somebody stay with him overnight.     [name], MD 12/15/2017, 7:57 AM

## 2017-12-15 NOTE — Anesthesia Postprocedure Evaluation (Signed)
Anesthesia Post Note  Patient: Brian Herring  Procedure(s) Performed: REVISION RIGHT BELOW KNEE AMPUTATION (Right )     Patient location during evaluation: PACU Anesthesia Type: General Level of consciousness: awake and alert Pain management: pain level controlled Vital Signs Assessment: post-procedure vital signs reviewed and stable Respiratory status: spontaneous breathing, nonlabored ventilation, respiratory function stable and patient connected to nasal cannula oxygen Cardiovascular status: blood pressure returned to baseline and stable Postop Assessment: no apparent nausea or vomiting Anesthetic complications: no    Last Vitals:  Vitals:   12/15/17 1050 12/15/17 1105  BP: 112/64 120/70  Pulse: 89 92  Resp: 16 19  Temp:  36.6 C  SpO2: 97% 96%    Last Pain:  Vitals:   12/15/17 1118  TempSrc:   PainSc: 4                  Barnet Glasgow

## 2017-12-16 ENCOUNTER — Encounter (HOSPITAL_COMMUNITY): Payer: Self-pay | Admitting: Orthopedic Surgery

## 2017-12-19 ENCOUNTER — Ambulatory Visit (INDEPENDENT_AMBULATORY_CARE_PROVIDER_SITE_OTHER): Payer: BLUE CROSS/BLUE SHIELD | Admitting: Orthopedic Surgery

## 2017-12-19 ENCOUNTER — Encounter (INDEPENDENT_AMBULATORY_CARE_PROVIDER_SITE_OTHER): Payer: Self-pay | Admitting: Orthopedic Surgery

## 2017-12-19 VITALS — Ht 72.0 in | Wt 224.0 lb

## 2017-12-19 DIAGNOSIS — Z89511 Acquired absence of right leg below knee: Secondary | ICD-10-CM

## 2017-12-19 NOTE — Progress Notes (Signed)
Office Visit Note   Patient: Brian Herring           Date of Birth: Sep 20, 1955           MRN: [id number] Visit Date: 12/19/2017              Requested by: [name], [place], [place] [address] PCP: [name], MD  Chief Complaint  Patient presents with  . Right Leg - Routine Post Op    12/15/17 revision right BKA HHN states wound vac "beeping since Friday"       HPI: Patient presents 4 days status post revision transtibial amputation on the right.  Patient states the wound VAC filled up and alarmed at home.  Assessment & Plan: Visit Diagnoses:  1. History of right below knee amputation ([place])     Plan: Patient will start Dial soap cleansing 4 x 4 Ace wrap plus using the for XL stump shrinker.  Once the incision is clean and dry he will transition to the 3 XL stump shrinker.  Harvest sutures at follow-up in 2 weeks.  Follow-Up Instructions: Return in about 2 weeks (around 01/02/2018).   Ortho Exam  Patient is alert, oriented, no adenopathy, well-dressed, normal affect, normal respiratory effort. Examination the wound edges are well approximated there is some clear resolving hematoma drainage no signs of infection no cellulitis no ischemic changes.  Imaging: No results found. No images are attached to the encounter.  Labs: Lab Results  Component Value Date   HGBA1C 6.9 (H) 12/15/2017   HGBA1C 8.7 (H) 10/12/2017   HGBA1C 8.8 (H) 08/14/2015   ESRSEDRATE 125 (H) 10/12/2017   CRP 32.4 (H) 10/12/2017   REPTSTATUS 10/25/2017 FINAL 10/24/2017   GRAMSTAIN  10/14/2017    RARE WBC PRESENT, PREDOMINANTLY PMN FEW RARE GRAM POSITIVE COCCI IN PAIRS    CULT  10/24/2017    NO GROWTH Performed at [facility], [place] [address], [place], [place] [address]      Lab Results  Component Value Date   ALBUMIN 2.3 (L) 10/21/2017   ALBUMIN 3.4 (L) 10/12/2017   ALBUMIN 4.4 09/21/2015   PREALBUMIN <5 (L) 10/12/2017    Body mass index is  30.38 kg/m.  Orders:  No orders of the defined types were placed in this encounter.  No orders of the defined types were placed in this encounter.    Procedures: No procedures performed  Clinical Data: No additional findings.  ROS:  All other systems negative, except as noted in the HPI. Review of Systems  Objective: Vital Signs: Ht 6' (1.829 m)   Wt 224 lb (101.6 kg)   BMI 30.38 kg/m   Specialty Comments:  No specialty comments available.  PMFS History: Patient Active Problem List   Diagnosis Date Noted  . Dehiscence of amputation stump ([place])   . History of right below knee amputation ([place]) 11/08/2017  . Hypoalbuminemia due to protein-calorie malnutrition ([place])   . Urinary retention   . Acute blood loss anemia   . Status post below knee amputation, right ([place]) 10/20/2017  . Unilateral complete BKA ([place]) 10/20/2017  . Sepsis ([place]) 10/12/2017  . Diabetic foot ulcer ([place]) 10/12/2017  . Diabetic foot infection ([place]) 10/12/2017  . Hyponatremia 10/12/2017  . Cellulitis of right foot 10/12/2017  . Chronic systolic CHF (congestive heart failure) ([place])   . Hyperkalemia 10/13/2015  . Congestive dilated cardiomyopathy ([place]) 07/13/2015  . Asymptomatic LV dysfunction 06/25/2015  . Poorly controlled  diabetes mellitus ([place]) 05/31/2015  . HTN (hypertension), benign 07/04/2014  . Viral cardiomyopathy, EF improved to 45-50% 04/02/2013  . Obesity 04/02/2013  . Diabetic foot ulcer with osteomyelitis ([place]) 04/02/2013  . Hyperlipidemia 04/02/2013  . Sinus tachycardia 04/02/2013   Past Medical History:  Diagnosis Date  . Arthritis    shoulder  . Cancer ([place])    skin cancer on nose  . Cardiomyopathy ([place])   . Chronic systolic CHF (congestive heart failure) ([place])   . Diabetes mellitus without complication ([place])   . Hyperlipidemia   . Hypertension     Family History  Problem Relation Age of Onset  . Pneumonia Mother        aspiration    Past Surgical History:    Procedure Laterality Date  . AMPUTATION Right 10/14/2017   Procedure: AMPUTATION  GREAT TOE;  Surgeon: [name], MD;  Location: [facility];  Service: Orthopedics;  Laterality: Right;  . AMPUTATION Right 10/18/2017   Procedure: RIGHT BELOW KNEE AMPUTATION;  Surgeon: [name], MD;  Location: [facility];  Service: Orthopedics;  Laterality: Right;  . SHOULDER SURGERY  01/05  . STUMP REVISION Right 12/15/2017   Procedure: REVISION RIGHT BELOW KNEE AMPUTATION;  Surgeon: [name], MD;  Location: [facility];  Service: Orthopedics;  Laterality: Right;   Social History   Occupational History  . Not on file  Tobacco Use  . Smoking status: Never Smoker  . Smokeless tobacco: Never Used  Substance and Sexual Activity  . Alcohol use: No    Alcohol/week: 0.0 oz  . Drug use: Never  . Sexual activity: Yes

## 2017-12-29 ENCOUNTER — Encounter: Payer: PPO | Admitting: Physical Medicine & Rehabilitation

## 2018-01-01 ENCOUNTER — Ambulatory Visit (INDEPENDENT_AMBULATORY_CARE_PROVIDER_SITE_OTHER): Payer: BLUE CROSS/BLUE SHIELD | Admitting: Orthopedic Surgery

## 2018-01-01 ENCOUNTER — Encounter (INDEPENDENT_AMBULATORY_CARE_PROVIDER_SITE_OTHER): Payer: Self-pay | Admitting: Orthopedic Surgery

## 2018-01-01 VITALS — Ht 72.0 in | Wt 224.0 lb

## 2018-01-01 DIAGNOSIS — Z89511 Acquired absence of right leg below knee: Secondary | ICD-10-CM

## 2018-01-01 NOTE — Progress Notes (Signed)
Office Visit Note   Patient: Brian Herring           Date of Birth: 1955/10/27           MRN: [id number] Visit Date: 01/01/2018              Requested by: [name], [place], [place] [address] PCP: [name], MD  Chief Complaint  Patient presents with  . Right Leg - Routine Post Op    Right BKA revision       HPI: Patient is a 62 year old gentleman who presents 2 weeks status post revision right transtibial amputation.  Patient states his been having a little bit of serosanguineous drainage.  Assessment & Plan: Visit Diagnoses:  1. History of right below knee amputation ([place])     Plan: Continue with gauze dressing Dial soap changes Ace wrap and wear the stump shrinker on top.  Follow-Up Instructions: Return in about 2 weeks (around 01/15/2018).   Ortho Exam  Patient is alert, oriented, no adenopathy, well-dressed, normal affect, normal respiratory effort. Examination patient's wound edges are well approximated there is a little bit of serosanguineous drainage there is no ischemic changes no cellulitis no signs of infection there is a little bit of swelling he has full extension of the knee.  Imaging: No results found. No images are attached to the encounter.  Labs: Lab Results  Component Value Date   HGBA1C 6.9 (H) 12/15/2017   HGBA1C 8.7 (H) 10/12/2017   HGBA1C 8.8 (H) 08/14/2015   ESRSEDRATE 125 (H) 10/12/2017   CRP 32.4 (H) 10/12/2017   REPTSTATUS 10/25/2017 FINAL 10/24/2017   GRAMSTAIN  10/14/2017    RARE WBC PRESENT, PREDOMINANTLY PMN FEW RARE GRAM POSITIVE COCCI IN PAIRS    CULT  10/24/2017    NO GROWTH Performed at [facility], [place] [address] [place], [place] [address]      Lab Results  Component Value Date   ALBUMIN 2.3 (L) 10/21/2017   ALBUMIN 3.4 (L) 10/12/2017   ALBUMIN 4.4 09/21/2015   PREALBUMIN <5 (L) 10/12/2017    Body mass index is 30.38 kg/m.  Orders:  No orders of the defined  types were placed in this encounter.  No orders of the defined types were placed in this encounter.    Procedures: No procedures performed  Clinical Data: No additional findings.  ROS:  All other systems negative, except as noted in the HPI. Review of Systems  Objective: Vital Signs: Ht 6' (1.829 m)   Wt 224 lb (101.6 kg)   BMI 30.38 kg/m   Specialty Comments:  No specialty comments available.  PMFS History: Patient Active Problem List   Diagnosis Date Noted  . Dehiscence of amputation stump ([place])   . History of right below knee amputation ([place]) 11/08/2017  . Hypoalbuminemia due to protein-calorie malnutrition ([place])   . Urinary retention   . Acute blood loss anemia   . Status post below knee amputation, right ([place]) 10/20/2017  . Unilateral complete BKA ([place]) 10/20/2017  . Sepsis ([place]) 10/12/2017  . Diabetic foot ulcer ([place]) 10/12/2017  . Diabetic foot infection ([place]) 10/12/2017  . Hyponatremia 10/12/2017  . Cellulitis of right foot 10/12/2017  . Chronic systolic CHF (congestive heart failure) ([place])   . Hyperkalemia 10/13/2015  . Congestive dilated cardiomyopathy ([place]) 07/13/2015  . Asymptomatic LV dysfunction 06/25/2015  . Poorly controlled diabetes mellitus ([place]) 05/31/2015  . HTN (hypertension), benign 07/04/2014  . Viral cardiomyopathy, EF  improved to 45-50% 04/02/2013  . Obesity 04/02/2013  . Diabetic foot ulcer with osteomyelitis ([place]) 04/02/2013  . Hyperlipidemia 04/02/2013  . Sinus tachycardia 04/02/2013   Past Medical History:  Diagnosis Date  . Arthritis    shoulder  . Cancer ([place])    skin cancer on nose  . Cardiomyopathy ([place])   . Chronic systolic CHF (congestive heart failure) ([place])   . Diabetes mellitus without complication ([place])   . Hyperlipidemia   . Hypertension     Family History  Problem Relation Age of Onset  . Pneumonia Mother        aspiration    Past Surgical History:  Procedure Laterality Date  . AMPUTATION Right 10/14/2017     Procedure: AMPUTATION  GREAT TOE;  Surgeon: [name], MD;  Location: [facility];  Service: Orthopedics;  Laterality: Right;  . AMPUTATION Right 10/18/2017   Procedure: RIGHT BELOW KNEE AMPUTATION;  Surgeon: [name], MD;  Location: [facility];  Service: Orthopedics;  Laterality: Right;  . SHOULDER SURGERY  01/05  . STUMP REVISION Right 12/15/2017   Procedure: REVISION RIGHT BELOW KNEE AMPUTATION;  Surgeon: [name], MD;  Location: [facility];  Service: Orthopedics;  Laterality: Right;   Social History   Occupational History  . Not on file  Tobacco Use  . Smoking status: Never Smoker  . Smokeless tobacco: Never Used  Substance and Sexual Activity  . Alcohol use: No    Alcohol/week: 0.0 oz  . Drug use: Never  . Sexual activity: Yes

## 2018-01-01 NOTE — Progress Notes (Signed)
xr oscal

## 2018-01-08 ENCOUNTER — Encounter (HOSPITAL_COMMUNITY): Payer: Self-pay | Admitting: Emergency Medicine

## 2018-01-08 ENCOUNTER — Other Ambulatory Visit: Payer: Self-pay

## 2018-01-08 ENCOUNTER — Emergency Department (HOSPITAL_COMMUNITY)
Admission: EM | Admit: 2018-01-08 | Discharge: 2018-01-09 | Disposition: A | Discharge status: Home / Self Care | Payer: BLUE CROSS/BLUE SHIELD | Attending: Emergency Medicine | Admitting: Emergency Medicine

## 2018-01-08 ENCOUNTER — Telehealth (INDEPENDENT_AMBULATORY_CARE_PROVIDER_SITE_OTHER): Payer: Self-pay

## 2018-01-08 DIAGNOSIS — I5022 Chronic systolic (congestive) heart failure: Secondary | ICD-10-CM | POA: Diagnosis not present

## 2018-01-08 DIAGNOSIS — Z79899 Other long term (current) drug therapy: Secondary | ICD-10-CM | POA: Insufficient documentation

## 2018-01-08 DIAGNOSIS — Z7982 Long term (current) use of aspirin: Secondary | ICD-10-CM | POA: Diagnosis not present

## 2018-01-08 DIAGNOSIS — Z89511 Acquired absence of right leg below knee: Secondary | ICD-10-CM | POA: Diagnosis not present

## 2018-01-08 DIAGNOSIS — E119 Type 2 diabetes mellitus without complications: Secondary | ICD-10-CM | POA: Insufficient documentation

## 2018-01-08 DIAGNOSIS — Z85828 Personal history of other malignant neoplasm of skin: Secondary | ICD-10-CM | POA: Diagnosis not present

## 2018-01-08 DIAGNOSIS — L7622 Postprocedural hemorrhage and hematoma of skin and subcutaneous tissue following other procedure: Secondary | ICD-10-CM | POA: Diagnosis present

## 2018-01-08 DIAGNOSIS — T148XXA Other injury of unspecified body region, initial encounter: Secondary | ICD-10-CM

## 2018-01-08 DIAGNOSIS — I110 Hypertensive heart disease with heart failure: Secondary | ICD-10-CM | POA: Diagnosis not present

## 2018-01-08 LAB — COMPREHENSIVE METABOLIC PANEL
ALBUMIN: 3.5 g/dL (ref 3.5–5.0)
ALT: 27 U/L (ref 17–63)
ANION GAP: 8 (ref 5–15)
AST: 30 U/L (ref 15–41)
Alkaline Phosphatase: 98 U/L (ref 38–126)
BILIRUBIN TOTAL: 0.3 mg/dL (ref 0.3–1.2)
BUN: 12 mg/dL (ref 6–20)
CHLORIDE: 102 mmol/L (ref 101–111)
CO2: 25 mmol/L (ref 22–32)
Calcium: 9.3 mg/dL (ref 8.9–10.3)
Creatinine, Ser: 0.78 mg/dL (ref 0.61–1.24)
GFR calc Af Amer: >60 mL/min (ref >60)
GFR calc non Af Amer: >60 mL/min (ref >60)
GLUCOSE: 176 mg/dL — AB (ref 65–99)
POTASSIUM: 4 mmol/L (ref 3.5–5.1)
SODIUM: 135 mmol/L (ref 135–145)
Total Protein: 7.1 g/dL (ref 6.5–8.1)

## 2018-01-08 LAB — CBC
HEMATOCRIT: 29.7 % — AB (ref 39.0–52.0)
HEMOGLOBIN: 9.6 g/dL — AB (ref 13.0–17.0)
MCH: 29.1 pg (ref 26.0–34.0)
MCHC: 32.3 g/dL (ref 30.0–36.0)
MCV: 90 fL (ref 78.0–100.0)
Platelets: 295 10*3/uL (ref 150–400)
RBC: 3.3 MIL/uL — AB (ref 4.22–5.81)
RDW: 14.4 % (ref 11.5–15.5)
WBC: 10.5 10*3/uL (ref 4.0–10.5)

## 2018-01-08 LAB — CBC WITH DIFFERENTIAL/PLATELET
BASOS ABS: 0.1 10*3/uL (ref 0.0–0.1)
BASOS PCT: 1 %
EOS ABS: 0.2 10*3/uL (ref 0.0–0.7)
Eosinophils Relative: 2 %
HEMATOCRIT: 34.6 % — AB (ref 39.0–52.0)
Hemoglobin: 11 g/dL — ABNORMAL LOW (ref 13.0–17.0)
Lymphocytes Relative: 17 %
Lymphs Abs: 1.5 10*3/uL (ref 0.7–4.0)
MCH: 28.4 pg (ref 26.0–34.0)
MCHC: 31.8 g/dL (ref 30.0–36.0)
MCV: 89.4 fL (ref 78.0–100.0)
MONO ABS: 0.7 10*3/uL (ref 0.1–1.0)
Monocytes Relative: 8 %
NEUTROS ABS: 6.7 10*3/uL (ref 1.7–7.7)
Neutrophils Relative %: 74 %
PLATELETS: 378 10*3/uL (ref 150–400)
RBC: 3.87 MIL/uL — ABNORMAL LOW (ref 4.22–5.81)
RDW: 14.4 % (ref 11.5–15.5)
WBC: 9.1 10*3/uL (ref 4.0–10.5)

## 2018-01-08 LAB — SAMPLE TO BLOOD BANK

## 2018-01-08 MED ORDER — SODIUM CHLORIDE 0.9 % IV BOLUS
500.0000 mL | Freq: Once | INTRAVENOUS | Status: AC
  Administered 2018-01-08: 500 mL via INTRAVENOUS

## 2018-01-08 NOTE — ED Provider Notes (Signed)
[facility] DEPT Provider Note   CSN: [id number] Arrival date & time: 01/08/18  1912     History   Chief Complaint Chief Complaint  Patient presents with  . Post-op Problem    HPI Brian Herring is a 62 y.o. male.  HPI Patient presents with bleeding from his surgical site of his right below the knee amputation.  Around 3 weeks ago had a revision of his BKA.  Over the last 3 weeks he has had some mild bleeding but today had much more blood.  States he looked down and there was blood everywhere.  States he feels a little lightheaded.  No chest pain.  No trouble breathing.  Tourniquet placed by EMS with control the bleeding.  He is not on anticoagulation. Past Medical History:  Diagnosis Date  . Arthritis    shoulder  . Cancer ([place])    skin cancer on nose  . Cardiomyopathy ([place])   . Chronic systolic CHF (congestive heart failure) ([place])   . Diabetes mellitus without complication ([place])   . Hyperlipidemia   . Hypertension     Patient Active Problem List   Diagnosis Date Noted  . Dehiscence of amputation stump ([place])   . History of right below knee amputation ([place]) 11/08/2017  . Hypoalbuminemia due to protein-calorie malnutrition ([place])   . Urinary retention   . Acute blood loss anemia   . Status post below knee amputation, right ([place]) 10/20/2017  . Unilateral complete BKA ([place]) 10/20/2017  . Sepsis ([place]) 10/12/2017  . Diabetic foot ulcer ([place]) 10/12/2017  . Diabetic foot infection ([place]) 10/12/2017  . Hyponatremia 10/12/2017  . Cellulitis of right foot 10/12/2017  . Chronic systolic CHF (congestive heart failure) ([place])   . Hyperkalemia 10/13/2015  . Congestive dilated cardiomyopathy ([place]) 07/13/2015  . Asymptomatic LV dysfunction 06/25/2015  . Poorly controlled diabetes mellitus ([place]) 05/31/2015  . HTN (hypertension), benign 07/04/2014  . Viral cardiomyopathy, EF improved to 45-50% 04/02/2013  . Obesity 04/02/2013  . Diabetic foot ulcer  with osteomyelitis ([place]) 04/02/2013  . Hyperlipidemia 04/02/2013  . Sinus tachycardia 04/02/2013    Past Surgical History:  Procedure Laterality Date  . AMPUTATION Right 10/14/2017   Procedure: AMPUTATION  GREAT TOE;  Surgeon: [name], MD;  Location: [facility];  Service: Orthopedics;  Laterality: Right;  . AMPUTATION Right 10/18/2017   Procedure: RIGHT BELOW KNEE AMPUTATION;  Surgeon: [name], MD;  Location: [facility];  Service: Orthopedics;  Laterality: Right;  . SHOULDER SURGERY  01/05  . STUMP REVISION Right 12/15/2017   Procedure: REVISION RIGHT BELOW KNEE AMPUTATION;  Surgeon: [name], MD;  Location: [facility];  Service: Orthopedics;  Laterality: Right;        Home Medications    Prior to Admission medications   Medication Sig Start Date End Date Taking? Authorizing Provider  acetaminophen (TYLENOL) 325 MG tablet Take 325-650 mg by mouth every 6 (six) hours as needed for moderate pain.   Yes [provider]  aspirin 81 MG tablet Take 81 mg by mouth every morning.    Yes [provider]  bethanechol (URECHOLINE) 25 MG tablet Take 1 tablet (25 mg total) by mouth 3 (three) times daily. 10/31/17  Yes Love, Ivan Anchors, PA-C  ciprofloxacin (CIPRO) 500 MG tablet TK 1 T PO BID FOR 10 DAYS 12/29/17  Yes [provider]  docusate sodium (COLACE) 100 MG capsule Take 1 capsule (100 mg total) by mouth 2 (two) times daily. Patient  taking differently: Take 100 mg by mouth 2 (two) times daily as needed for mild constipation.  10/31/17  Yes Love, Ivan Anchors, PA-C  furosemide (LASIX) 40 MG tablet Take 40 mg by mouth daily as needed for fluid or edema.    Yes [provider]  glimepiride (AMARYL) 4 MG tablet Take 0.5 tablets (2 mg total) by mouth every morning. Patient taking differently: Take 4 mg by mouth every morning.  10/31/17 10/31/18 Yes Love, Ivan Anchors, PA-C  Insulin Glargine (LANTUS) 100 UNIT/ML Solostar Pen Inject 24 Units into the skin at bedtime. 10/31/17  Yes  Love, Ivan Anchors, PA-C  methocarbamol (ROBAXIN) 500 MG tablet Take 1 tablet (500 mg total) by mouth every 6 (six) hours as needed for muscle spasms. 10/31/17  Yes Love, Ivan Anchors, PA-C  metoprolol succinate (TOPROL-XL) 25 MG 24 hr tablet Take 1 tablet (25 mg total) by mouth every evening. 10/31/17  Yes Love, Ivan Anchors, PA-C  rosuvastatin (CRESTOR) 10 MG tablet Take 10 mg by mouth every evening.    Yes [provider]  sacubitril-valsartan (ENTRESTO) 97-103 MG Take 1 tablet by mouth 2 (two) times daily.   Yes [provider]  tamsulosin (FLOMAX) 0.4 MG CAPS capsule Take 2 capsules (0.8 mg total) by mouth daily after supper. 10/31/17  Yes Love, Ivan Anchors, PA-C  acetaminophen (TYLENOL) 325 MG tablet Take 1-2 tablets (325-650 mg total) by mouth every 4 (four) hours as needed for mild pain. Patient not taking: Reported on 01/08/2018 10/31/17   Love, Ivan Anchors, PA-C  blood glucose meter kit and supplies KIT Dispense based on patient and insurance preference. Use up to four times daily as directed. (FOR ICD-10: E 11.65). 10/31/17   Love, Ivan Anchors, PA-C  Insulin Pen Needle (CAREFINE PEN NEEDLES) 31G X 6 MM MISC 1 application by Does not apply route daily. 10/31/17   Love, Ivan Anchors, PA-C  oxyCODONE (OXY IR/ROXICODONE) 5 MG immediate release tablet Take 1-2 tablets (5-10 mg total) by mouth every 6 (six) hours as needed for severe pain (pain score 4-6). 10/31/17   Love, Ivan Anchors, PA-C    Family History Family History  Problem Relation Age of Onset  . Pneumonia Mother        aspiration    Social History Social History   Tobacco Use  . Smoking status: Never Smoker  . Smokeless tobacco: Never Used  Substance Use Topics  . Alcohol use: No    Alcohol/week: 0.0 oz  . Drug use: Never     Allergies   Sulfur and Norvasc [amlodipine besylate]   Review of Systems Review of Systems  Constitutional: Negative for appetite change.  Gastrointestinal: Negative for abdominal pain.  Skin: Positive for wound.   Neurological: Positive for light-headedness.  Hematological: Does not bruise/bleed easily.     Physical Exam Updated Vital Signs BP 123/68   Pulse (!) 102   Temp 99.1 F (37.3 C) (Oral)   Resp (!) 0   Ht 6' (1.829 m)   Wt 101.2 kg (223 lb)   SpO2 96%   BMI 30.24 kg/m   Physical Exam  Constitutional: He appears well-developed.  HENT:  Head: Normocephalic.  Cardiovascular:  Tachycardia  Pulmonary/Chest: Effort normal.  Abdominal: There is no tenderness.  Musculoskeletal:  Previous right below the knee amputation.  Sutures intact.  There is a tourniquet on the right lower thigh.  Tourniquet removed with some mild bleeding towards the medial aspect of the wound.  Not pulsatile.  New dressing placed  with Surgifoam, 2 x 2 and 4 x 4 covered in Ace bandages.  Bleeding controlled with this.  Neurological: He is alert.  Skin: Skin is warm. Capillary refill takes less than 2 seconds.     ED Treatments / Results  Labs (all labs ordered are listed, but only abnormal results are displayed) Labs Reviewed  CBC WITH DIFFERENTIAL/PLATELET - Abnormal; Notable for the following components:      Result Value   RBC 3.87 (*)    Hemoglobin 11.0 (*)    HCT 34.6 (*)    All other components within normal limits  COMPREHENSIVE METABOLIC PANEL - Abnormal; Notable for the following components:   Glucose, Bld 176 (*)    All other components within normal limits  CBC - Abnormal; Notable for the following components:   RBC 3.30 (*)    Hemoglobin 9.6 (*)    HCT 29.7 (*)    All other components within normal limits  SAMPLE TO BLOOD BANK    EKG None  Radiology No results found.  Procedures Procedures (including critical care time)  Medications Ordered in ED Medications  sodium chloride 0.9 % bolus 500 mL (0 mLs Intravenous Stopped 01/08/18 2021)     Initial Impression / Assessment and Plan / ED Course  I have reviewed the triage vital signs and the nursing notes.  Pertinent labs  & imaging results that were available during my care of the patient were reviewed by me and considered in my medical decision making (see chart for details).     Patient with bleeding from recent surgical site.  Reportedly had moderate amount of bleeding at home.  Bleeding controlled here.  Hemoglobin is decreased somewhat.  Patient feels much better after fluid bolus.  Will need follow-up.  We will follow-up with Dr. Sharol Given.  Will return for worsening lightheadedness or dizziness and would likely need hemoglobin recheck at that time  Final Clinical Impressions(s) / ED Diagnoses   Final diagnoses:  Bleeding from wound    ED Discharge Orders    None       Davonna Belling, MD 01/08/18 2328

## 2018-01-08 NOTE — ED Triage Notes (Signed)
Pt BIB EMS for post-op bleeding. Patient had BKA a few months ago, and a DVT in the same leg x3 weeks ago. Patient has slow bleeding some what controlled by a tourniquet. Pressure in truck 120/76, on arrival 90s/60s. No IV in place.

## 2018-01-08 NOTE — Discharge Instructions (Addendum)
Follow-up with Dr. Sharol Given, give a call to the office tomorrow.  Return for increased bleeding or worsening lightheadedness or dizziness.

## 2018-01-08 NOTE — Telephone Encounter (Signed)
[name] with [facility] called stating that patient is having moderate drainage from his right stump.  Has a slight fever of 99.2 and patient completed his antibiotics today for a UTI. This called was received at [place].  CB# is [phone] for [place] and [phone] for patient.  Please advise.  Thank you.

## 2018-01-08 NOTE — ED Notes (Signed)
MD bedside

## 2018-01-08 NOTE — ED Notes (Signed)
Bed: JR93 Expected date:  Expected time:  Means of arrival:  Comments: EMS-S/P surgical issue-bleed

## 2018-01-08 NOTE — ED Notes (Addendum)
Davonna Belling, MD and Clarise Cruz, RN notified of abnormal vital signs, including blood pressure @ 1928.

## 2018-01-09 LAB — CBG MONITORING, ED: GLUCOSE-CAPILLARY: 174 mg/dL — AB (ref 65–99)

## 2018-01-09 NOTE — Telephone Encounter (Signed)
Called pt and offered appt for today and he states that he does not have transportation for today and that he will call back once he is able to have someone drive him in.advised we need to follow up with him this week.

## 2018-01-10 ENCOUNTER — Encounter (INDEPENDENT_AMBULATORY_CARE_PROVIDER_SITE_OTHER): Payer: Self-pay | Admitting: Family

## 2018-01-10 ENCOUNTER — Ambulatory Visit (INDEPENDENT_AMBULATORY_CARE_PROVIDER_SITE_OTHER): Payer: BLUE CROSS/BLUE SHIELD | Admitting: Family

## 2018-01-10 VITALS — Ht 72.0 in | Wt 223.0 lb

## 2018-01-10 DIAGNOSIS — Z89511 Acquired absence of right leg below knee: Secondary | ICD-10-CM

## 2018-01-10 NOTE — Progress Notes (Signed)
Office Visit Note   Patient: Brian Herring           Date of Birth: 10-09-1955           MRN: [id number] Visit Date: 01/10/2018              Requested by: [name], [place], [place] [address] PCP: [name], MD  Chief Complaint  Patient presents with  . Right Leg - Routine Post Op    12/15/17 right revision BKA  S/p ER visit monday      HPI: Patient is a 62 year old gentleman who presents status post revision right transtibial amputation on 12/15/17.  Patient states his been having a bloody drainage laterally. Has been to ED for same. Was concerned for continuous bleeding. States EMS did apply a tourniquet.  Assessment & Plan: Visit Diagnoses:  No diagnosis found.  Plan: Continue with gauze dressing, change daily or prn soiled. Dial soap changes Ace wrap and wear the stump shrinker on top.  Will follow up as scheduled.  Follow-Up Instructions: No follow-ups on file.   Ortho Exam  Patient is alert, oriented, no adenopathy, well-dressed, normal affect, normal respiratory effort. Examination patient's wound edges are well approximated. Has healed well medially. Laterally blood clot expressed. there is no ischemic changes no cellulitis no signs of infection.  Imaging: No results found. No images are attached to the encounter.  Labs: Lab Results  Component Value Date   HGBA1C 6.9 (H) 12/15/2017   HGBA1C 8.7 (H) 10/12/2017   HGBA1C 8.8 (H) 08/14/2015   ESRSEDRATE 125 (H) 10/12/2017   CRP 32.4 (H) 10/12/2017   REPTSTATUS 10/25/2017 FINAL 10/24/2017   GRAMSTAIN  10/14/2017    RARE WBC PRESENT, PREDOMINANTLY PMN FEW RARE GRAM POSITIVE COCCI IN PAIRS    CULT  10/24/2017    NO GROWTH Performed at [facility], [place] [address] [place], [place] [address]      Lab Results  Component Value Date   ALBUMIN 3.5 01/08/2018   ALBUMIN 2.3 (L) 10/21/2017   ALBUMIN 3.4 (L) 10/12/2017   PREALBUMIN <5 (L) 10/12/2017    Body  mass index is 30.24 kg/m.  Orders:  No orders of the defined types were placed in this encounter.  No orders of the defined types were placed in this encounter.    Procedures: No procedures performed  Clinical Data: No additional findings.  ROS:  All other systems negative, except as noted in the HPI. Review of Systems  Constitutional: Negative for chills and fever.    Objective: Vital Signs: Ht 6' (1.829 m)   Wt 223 lb (101.2 kg)   BMI 30.24 kg/m   Specialty Comments:  No specialty comments available.  PMFS History: Patient Active Problem List   Diagnosis Date Noted  . Dehiscence of amputation stump ([place])   . History of right below knee amputation ([place]) 11/08/2017  . Hypoalbuminemia due to protein-calorie malnutrition ([place])   . Urinary retention   . Acute blood loss anemia   . Status post below knee amputation, right ([place]) 10/20/2017  . Unilateral complete BKA ([place]) 10/20/2017  . Sepsis ([place]) 10/12/2017  . Diabetic foot ulcer ([place]) 10/12/2017  . Diabetic foot infection ([place]) 10/12/2017  . Hyponatremia 10/12/2017  . Cellulitis of right foot 10/12/2017  . Chronic systolic CHF (congestive heart failure) ([place])   . Hyperkalemia 10/13/2015  . Congestive dilated cardiomyopathy ([place]) 07/13/2015  . Asymptomatic LV dysfunction 06/25/2015  . Poorly controlled  diabetes mellitus ([place]) 05/31/2015  . HTN (hypertension), benign 07/04/2014  . Viral cardiomyopathy, EF improved to 45-50% 04/02/2013  . Obesity 04/02/2013  . Diabetic foot ulcer with osteomyelitis ([place]) 04/02/2013  . Hyperlipidemia 04/02/2013  . Sinus tachycardia 04/02/2013   Past Medical History:  Diagnosis Date  . Arthritis    shoulder  . Cancer ([place])    skin cancer on nose  . Cardiomyopathy ([place])   . Chronic systolic CHF (congestive heart failure) ([place])   . Diabetes mellitus without complication ([place])   . Hyperlipidemia   . Hypertension     Family History  Problem Relation Age of Onset  .  Pneumonia Mother        aspiration    Past Surgical History:  Procedure Laterality Date  . AMPUTATION Right 10/14/2017   Procedure: AMPUTATION  GREAT TOE;  Surgeon: [name], MD;  Location: [facility];  Service: Orthopedics;  Laterality: Right;  . AMPUTATION Right 10/18/2017   Procedure: RIGHT BELOW KNEE AMPUTATION;  Surgeon: [name], MD;  Location: [facility];  Service: Orthopedics;  Laterality: Right;  . SHOULDER SURGERY  01/05  . STUMP REVISION Right 12/15/2017   Procedure: REVISION RIGHT BELOW KNEE AMPUTATION;  Surgeon: [name], MD;  Location: [facility];  Service: Orthopedics;  Laterality: Right;   Social History   Occupational History  . Not on file  Tobacco Use  . Smoking status: Never Smoker  . Smokeless tobacco: Never Used  Substance and Sexual Activity  . Alcohol use: No    Alcohol/week: 0.0 oz  . Drug use: Never  . Sexual activity: Yes

## 2018-01-15 ENCOUNTER — Telehealth (INDEPENDENT_AMBULATORY_CARE_PROVIDER_SITE_OTHER): Payer: Self-pay

## 2018-01-15 NOTE — Telephone Encounter (Signed)
[name] Nurse from [place] called Patent attorney. States patient right stump has moderate bleeding. When she pushed on Stump she pulled out 4 blood clots from Stump.     I advised Autumn( Dr Jess Barters Assistant)about this. She would like for me to put patient on Erin's schedule on Wednesday.  Called patient to make appt. He states the only time he can come in would be around 2 bc his ride can only bring him that time. . Made appt for 2:15.   Called [name] to let her know and no answer IPPG   FQ [phone]

## 2018-01-17 ENCOUNTER — Ambulatory Visit (INDEPENDENT_AMBULATORY_CARE_PROVIDER_SITE_OTHER): Payer: BLUE CROSS/BLUE SHIELD | Admitting: Family

## 2018-01-22 ENCOUNTER — Ambulatory Visit (INDEPENDENT_AMBULATORY_CARE_PROVIDER_SITE_OTHER): Payer: Self-pay | Admitting: Orthopedic Surgery

## 2018-01-22 ENCOUNTER — Encounter (INDEPENDENT_AMBULATORY_CARE_PROVIDER_SITE_OTHER): Payer: Self-pay | Admitting: Orthopedic Surgery

## 2018-01-22 VITALS — Ht 72.0 in | Wt 223.0 lb

## 2018-01-22 DIAGNOSIS — Z89511 Acquired absence of right leg below knee: Secondary | ICD-10-CM

## 2018-01-22 NOTE — Progress Notes (Signed)
Office Visit Note   Patient: Brian Herring           Date of Birth: Nov 06, 1955           MRN: [id number] Visit Date: 01/22/2018              Requested by: [name], [place], [place] [address] PCP: [name], MD  Chief Complaint  Patient presents with  . Right Knee - Follow-up    12/15/17 Revision Right Below Knee Amputation      HPI: Patient is a 62 year old gentleman who presents 5 weeks status post revision right transtibial amputation.  Patient states that blood clots have been expressed from the residual limb stopped at this point.  He denies any pain denies any systemic symptoms.  Patient is currently working with biotech for prosthetic fitting.  Assessment & Plan: Visit Diagnoses:  1. Status post below knee amputation, right [facility])     Plan: Start Dial soap cleansing, harvest the sutures today, continue with the smaller stump shrinker.  Follow-Up Instructions: Return in about 1 month (around 02/19/2018).   Ortho Exam  Patient is alert, oriented, no adenopathy, well-dressed, normal affect, normal respiratory effort. On examination there is no redness no cellulitis patient does have a wound is gaped open about 10 mm x 1 mm.  Height could express a little bit more blood clot from this area there is no purulence no cellulitis no tenderness to palpation no signs of infection.  Imaging: No results found. No images are attached to the encounter.  Labs: Lab Results  Component Value Date   HGBA1C 6.9 (H) 12/15/2017   HGBA1C 8.7 (H) 10/12/2017   HGBA1C 8.8 (H) 08/14/2015   ESRSEDRATE 125 (H) 10/12/2017   CRP 32.4 (H) 10/12/2017   REPTSTATUS 10/25/2017 FINAL 10/24/2017   GRAMSTAIN  10/14/2017    RARE WBC PRESENT, PREDOMINANTLY PMN FEW RARE GRAM POSITIVE COCCI IN PAIRS    CULT  10/24/2017    NO GROWTH Performed at [facility], [place] [address] [place], [place] [address]      Lab Results  Component Value Date   ALBUMIN 3.5 01/08/2018   ALBUMIN 2.3 (L) 10/21/2017   ALBUMIN 3.4 (L) 10/12/2017   PREALBUMIN <5 (L) 10/12/2017    Body mass index is 30.24 kg/m.  Orders:  No orders of the defined types were placed in this encounter.  No orders of the defined types were placed in this encounter.    Procedures: No procedures performed  Clinical Data: No additional findings.  ROS:  All other systems negative, except as noted in the HPI. Review of Systems  Objective: Vital Signs: Ht 6' (1.829 m)   Wt 223 lb (101.2 kg)   BMI 30.24 kg/m   Specialty Comments:  No specialty comments available.  PMFS History: Patient Active Problem List   Diagnosis Date Noted  . Dehiscence of amputation stump ([place])   . History of right below knee amputation ([place]) 11/08/2017  . Hypoalbuminemia due to protein-calorie malnutrition ([place])   . Urinary retention   . Acute blood loss anemia   . Status post below knee amputation, right ([place]) 10/20/2017  . Unilateral complete BKA ([place]) 10/20/2017  . Sepsis ([place]) 10/12/2017  . Diabetic foot ulcer ([place]) 10/12/2017  . Diabetic foot infection ([place]) 10/12/2017  . Hyponatremia 10/12/2017  . Chronic systolic CHF (congestive heart failure) ([place])   . Hyperkalemia 10/13/2015  . Congestive dilated cardiomyopathy ([place]) 07/13/2015  .  Asymptomatic LV dysfunction 06/25/2015  . Poorly controlled diabetes mellitus ([place]) 05/31/2015  . HTN (hypertension), benign 07/04/2014  . Viral cardiomyopathy, EF improved to 45-50% 04/02/2013  . Obesity 04/02/2013  . Diabetic foot ulcer with osteomyelitis ([place]) 04/02/2013  . Hyperlipidemia 04/02/2013  . Sinus tachycardia 04/02/2013   Past Medical History:  Diagnosis Date  . Arthritis    shoulder  . Cancer ([place])    skin cancer on nose  . Cardiomyopathy ([place])   . Chronic systolic CHF (congestive heart failure) ([place])   . Diabetes mellitus without complication ([place])   . Hyperlipidemia   . Hypertension     Family History  Problem  Relation Age of Onset  . Pneumonia Mother        aspiration    Past Surgical History:  Procedure Laterality Date  . AMPUTATION Right 10/14/2017   Procedure: AMPUTATION  GREAT TOE;  Surgeon: [name], MD;  Location: [facility];  Service: Orthopedics;  Laterality: Right;  . AMPUTATION Right 10/18/2017   Procedure: RIGHT BELOW KNEE AMPUTATION;  Surgeon: [name], MD;  Location: [facility];  Service: Orthopedics;  Laterality: Right;  . SHOULDER SURGERY  01/05  . STUMP REVISION Right 12/15/2017   Procedure: REVISION RIGHT BELOW KNEE AMPUTATION;  Surgeon: [name], MD;  Location: [facility];  Service: Orthopedics;  Laterality: Right;   Social History   Occupational History  . Not on file  Tobacco Use  . Smoking status: Never Smoker  . Smokeless tobacco: Never Used  Substance and Sexual Activity  . Alcohol use: No    Alcohol/week: 0.0 oz  . Drug use: Never  . Sexual activity: Yes

## 2018-01-24 DIAGNOSIS — R3912 Poor urinary stream: Secondary | ICD-10-CM | POA: Diagnosis not present

## 2018-01-24 DIAGNOSIS — R3914 Feeling of incomplete bladder emptying: Secondary | ICD-10-CM | POA: Diagnosis not present

## 2018-01-25 DIAGNOSIS — Z89511 Acquired absence of right leg below knee: Secondary | ICD-10-CM | POA: Diagnosis not present

## 2018-01-25 DIAGNOSIS — Z794 Long term (current) use of insulin: Secondary | ICD-10-CM | POA: Diagnosis not present

## 2018-01-25 DIAGNOSIS — I110 Hypertensive heart disease with heart failure: Secondary | ICD-10-CM | POA: Diagnosis not present

## 2018-01-25 DIAGNOSIS — Z7982 Long term (current) use of aspirin: Secondary | ICD-10-CM | POA: Diagnosis not present

## 2018-01-25 DIAGNOSIS — E119 Type 2 diabetes mellitus without complications: Secondary | ICD-10-CM | POA: Diagnosis not present

## 2018-01-25 DIAGNOSIS — I5022 Chronic systolic (congestive) heart failure: Secondary | ICD-10-CM | POA: Diagnosis not present

## 2018-01-25 DIAGNOSIS — I420 Dilated cardiomyopathy: Secondary | ICD-10-CM | POA: Diagnosis not present

## 2018-01-25 DIAGNOSIS — T8781 Dehiscence of amputation stump: Secondary | ICD-10-CM | POA: Diagnosis not present

## 2018-01-25 DIAGNOSIS — Z9181 History of falling: Secondary | ICD-10-CM | POA: Diagnosis not present

## 2018-01-25 DIAGNOSIS — I2510 Atherosclerotic heart disease of native coronary artery without angina pectoris: Secondary | ICD-10-CM | POA: Diagnosis not present

## 2018-01-26 ENCOUNTER — Telehealth (INDEPENDENT_AMBULATORY_CARE_PROVIDER_SITE_OTHER): Payer: Self-pay | Admitting: Orthopedic Surgery

## 2018-01-26 NOTE — Telephone Encounter (Signed)
[facility] wants to continue to see patient 2x weeks for 9 weeks to loosely pack wound with guaze and cover with bordered foam.  Please call Scott @ [phone]

## 2018-01-26 NOTE — Telephone Encounter (Signed)
I called Scott and gave verbal order.

## 2018-01-26 NOTE — Telephone Encounter (Signed)
Ok continue HHPT

## 2018-01-26 NOTE — Telephone Encounter (Signed)
Please advise 

## 2018-01-30 ENCOUNTER — Other Ambulatory Visit: Payer: Self-pay | Admitting: Physical Medicine and Rehabilitation

## 2018-01-30 DIAGNOSIS — Z9181 History of falling: Secondary | ICD-10-CM | POA: Diagnosis not present

## 2018-01-30 DIAGNOSIS — I2510 Atherosclerotic heart disease of native coronary artery without angina pectoris: Secondary | ICD-10-CM | POA: Diagnosis not present

## 2018-01-30 DIAGNOSIS — T8781 Dehiscence of amputation stump: Secondary | ICD-10-CM | POA: Diagnosis not present

## 2018-01-30 DIAGNOSIS — E119 Type 2 diabetes mellitus without complications: Secondary | ICD-10-CM | POA: Diagnosis not present

## 2018-01-30 DIAGNOSIS — Z89511 Acquired absence of right leg below knee: Secondary | ICD-10-CM | POA: Diagnosis not present

## 2018-01-30 DIAGNOSIS — I5022 Chronic systolic (congestive) heart failure: Secondary | ICD-10-CM | POA: Diagnosis not present

## 2018-01-30 DIAGNOSIS — I110 Hypertensive heart disease with heart failure: Secondary | ICD-10-CM | POA: Diagnosis not present

## 2018-01-30 DIAGNOSIS — I420 Dilated cardiomyopathy: Secondary | ICD-10-CM | POA: Diagnosis not present

## 2018-01-30 DIAGNOSIS — Z794 Long term (current) use of insulin: Secondary | ICD-10-CM | POA: Diagnosis not present

## 2018-01-30 DIAGNOSIS — Z7982 Long term (current) use of aspirin: Secondary | ICD-10-CM | POA: Diagnosis not present

## 2018-02-01 ENCOUNTER — Encounter: Payer: Self-pay | Admitting: Physical Medicine & Rehabilitation

## 2018-02-01 ENCOUNTER — Encounter: Payer: PPO | Attending: Physical Medicine & Rehabilitation | Admitting: Physical Medicine & Rehabilitation

## 2018-02-01 VITALS — BP 130/79 | HR 89 | Resp 14 | Ht 72.0 in | Wt 221.0 lb

## 2018-02-01 DIAGNOSIS — I110 Hypertensive heart disease with heart failure: Secondary | ICD-10-CM | POA: Insufficient documentation

## 2018-02-01 DIAGNOSIS — T8130XA Disruption of wound, unspecified, initial encounter: Secondary | ICD-10-CM

## 2018-02-01 DIAGNOSIS — Z89511 Acquired absence of right leg below knee: Secondary | ICD-10-CM

## 2018-02-01 DIAGNOSIS — E1165 Type 2 diabetes mellitus with hyperglycemia: Secondary | ICD-10-CM

## 2018-02-01 DIAGNOSIS — R339 Retention of urine, unspecified: Secondary | ICD-10-CM

## 2018-02-01 DIAGNOSIS — R269 Unspecified abnormalities of gait and mobility: Secondary | ICD-10-CM

## 2018-02-01 DIAGNOSIS — Z89411 Acquired absence of right great toe: Secondary | ICD-10-CM | POA: Diagnosis not present

## 2018-02-01 DIAGNOSIS — E119 Type 2 diabetes mellitus without complications: Secondary | ICD-10-CM | POA: Diagnosis not present

## 2018-02-01 DIAGNOSIS — I5022 Chronic systolic (congestive) heart failure: Secondary | ICD-10-CM | POA: Diagnosis not present

## 2018-02-01 NOTE — Progress Notes (Signed)
Subjective:    Patient ID: Brian Herring, male    DOB: May 27, 1956, 62 y.o.   MRN: [id number]  HPI 61 y.o. male with history of T2DM, HTN, chronic systolic CHF presents for follow up for right BKA.   Last clinic visit 11/17/17.  Since that time, pt had revision of his BKA for wound dehiscence and DVT, notes reviewed.  Pt states he is doing HEP.  He is following up with Ortho. Pain is controlled. CBGs have been controlled. He is following up with Urology.  Denies falls. Using wheelchair at all times.   Pain Inventory Average Pain 0 Pain Right Now 0 My pain is no pain  In the last 24 hours, has pain interfered with the following? General activity 0 Relation with others 0 Enjoyment of life 0 What TIME of day is your pain at its worst? no pain Sleep (in general) Good  Pain is worse with: no pain Pain improves with: no pain Relief from Meds: no pain  Mobility ability to climb steps?  no do you drive?  no use a wheelchair transfers alone  Function disabled: date disabled .  Neuro/Psych trouble walking  Prior Studies Any changes since last visit?  no  Physicians involved in your care Any changes since last visit?  no   Family History  Problem Relation Age of Onset  . Pneumonia Mother        aspiration   Social History   Socioeconomic History  . Marital status: Single    Spouse name: Not on file  . Number of children: Not on file  . Years of education: Not on file  . Highest education level: Not on file  Occupational History  . Not on file  Social Needs  . Financial resource strain: Not on file  . Food insecurity:    Worry: Not on file    Inability: Not on file  . Transportation needs:    Medical: Not on file    Non-medical: Not on file  Tobacco Use  . Smoking status: Never Smoker  . Smokeless tobacco: Never Used  Substance and Sexual Activity  . Alcohol use: No    Alcohol/week: 0.0 oz  . Drug use: Never  . Sexual activity: Yes  Lifestyle  .  Physical activity:    Days per week: Not on file    Minutes per session: Not on file  . Stress: Not on file  Relationships  . Social connections:    Talks on phone: Not on file    Gets together: Not on file    Attends religious service: Not on file    Active member of club or organization: Not on file    Attends meetings of clubs or organizations: Not on file    Relationship status: Not on file  Other Topics Concern  . Not on file  Social History Narrative  . Not on file   Past Surgical History:  Procedure Laterality Date  . AMPUTATION Right 10/14/2017   Procedure: AMPUTATION  GREAT TOE;  Surgeon: [name], MD;  Location: [facility];  Service: Orthopedics;  Laterality: Right;  . AMPUTATION Right 10/18/2017   Procedure: RIGHT BELOW KNEE AMPUTATION;  Surgeon: [name], MD;  Location: [facility];  Service: Orthopedics;  Laterality: Right;  . SHOULDER SURGERY  01/05  . STUMP REVISION Right 12/15/2017   Procedure: REVISION RIGHT BELOW KNEE AMPUTATION;  Surgeon: [name], MD;  Location: [facility];  Service: Orthopedics;  Laterality: Right;  Past Medical History:  Diagnosis Date  . Arthritis    shoulder  . Cancer ([place])    skin cancer on nose  . Cardiomyopathy ([place])   . Chronic systolic CHF (congestive heart failure) ([place])   . Diabetes mellitus without complication ([place])   . Hyperlipidemia   . Hypertension    BP 130/79   Pulse 89   Resp 14   Ht 6' (1.829 m)   Wt 221 lb (100.2 kg)   SpO2 93%   BMI 29.97 kg/m   Opioid Risk Score:   Fall Risk Score:  `1  Depression screen PHQ 2/9  No flowsheet data found.   Review of Systems  Constitutional: Negative.   HENT: Negative.   Eyes: Negative.   Respiratory: Negative.   Cardiovascular: Negative.   Gastrointestinal: Negative.   Endocrine: Negative.   Musculoskeletal: Positive for gait problem.  Skin: Negative.   Allergic/Immunologic: Negative.   Hematological: Negative.   Psychiatric/Behavioral: Negative.   All  other systems reviewed and are negative.     Objective:   Physical Exam Gen NAD. Well-developed. HEENT: Normocephalic, atraumatic.  Cardio: RRR and no JVD Resp: CTA B/L and unlabored GI: BS positive and ND Musc/Skel:  Right BKA Neuro: Alert/Oriented Motor: 5/5 R HF  Skin:   BKA with ulcer on medial side    Assessment & Plan:  62 y.o. male with history of T2DM, HTN, chronic systolic CHF presents for follow up for right BKA.  1. Functional and mobility deficits secondary to right BKA    Contact therapies to resume, reminded again  Cont follow up with Ortho  2. T2DM  Relatively controlled at present  Cont to monitor  Cont meds  Follow up with PCP  3.  Urinary retention  Cont I/O caths  Cont follow up with Urology  4. Gait abnormality  Cont wheelchair for safety  Therapies to resume  Pt would like to follow up PRN due to financial concerns

## 2018-02-05 DIAGNOSIS — N401 Enlarged prostate with lower urinary tract symptoms: Secondary | ICD-10-CM | POA: Diagnosis not present

## 2018-02-05 DIAGNOSIS — R3914 Feeling of incomplete bladder emptying: Secondary | ICD-10-CM | POA: Diagnosis not present

## 2018-02-05 DIAGNOSIS — R338 Other retention of urine: Secondary | ICD-10-CM | POA: Diagnosis not present

## 2018-02-06 DIAGNOSIS — I2510 Atherosclerotic heart disease of native coronary artery without angina pectoris: Secondary | ICD-10-CM | POA: Diagnosis not present

## 2018-02-06 DIAGNOSIS — I5022 Chronic systolic (congestive) heart failure: Secondary | ICD-10-CM | POA: Diagnosis not present

## 2018-02-06 DIAGNOSIS — T8781 Dehiscence of amputation stump: Secondary | ICD-10-CM | POA: Diagnosis not present

## 2018-02-06 DIAGNOSIS — Z89511 Acquired absence of right leg below knee: Secondary | ICD-10-CM | POA: Diagnosis not present

## 2018-02-06 DIAGNOSIS — I110 Hypertensive heart disease with heart failure: Secondary | ICD-10-CM | POA: Diagnosis not present

## 2018-02-06 DIAGNOSIS — Z9181 History of falling: Secondary | ICD-10-CM | POA: Diagnosis not present

## 2018-02-06 DIAGNOSIS — Z7982 Long term (current) use of aspirin: Secondary | ICD-10-CM | POA: Diagnosis not present

## 2018-02-06 DIAGNOSIS — I420 Dilated cardiomyopathy: Secondary | ICD-10-CM | POA: Diagnosis not present

## 2018-02-06 DIAGNOSIS — E119 Type 2 diabetes mellitus without complications: Secondary | ICD-10-CM | POA: Diagnosis not present

## 2018-02-06 DIAGNOSIS — Z794 Long term (current) use of insulin: Secondary | ICD-10-CM | POA: Diagnosis not present

## 2018-02-07 ENCOUNTER — Other Ambulatory Visit: Payer: Self-pay

## 2018-02-08 ENCOUNTER — Telehealth: Payer: Self-pay | Admitting: Pharmacist

## 2018-02-08 ENCOUNTER — Encounter: Payer: Self-pay | Admitting: Pharmacist

## 2018-02-08 NOTE — Patient Outreach (Signed)
[facility]) Care Management  Brian Herring   02/08/2018  Brian Herring Nov 30, 1955 [id number]  Subjective: Patient was called regarding medication assistance per referral. HIPAA identifiers were obtained. Patient is a 62 year old male with multiple medical conditions including but not limited to:  uncontrolled type 2 diabetes s/p BKA due to diabetic osteomyelitis, CHF, hyperlipidemia, urinary retention and obesity.  Objective:  HGA1c- 6.9%   Encounter Medications: Outpatient Encounter Medications as of 02/08/2018  Medication Sig Note  . acetaminophen (TYLENOL) 325 MG tablet Take 1-2 tablets (325-650 mg total) by mouth every 4 (four) hours as needed for mild pain.   Marland Kitchen aspirin 81 MG tablet Take 81 mg by mouth every morning.    . blood glucose meter kit and supplies KIT Dispense based on patient and insurance preference. Use up to four times daily as directed. (FOR ICD-10: E 11.65).   Marland Kitchen docusate sodium (COLACE) 100 MG capsule Take 1 capsule (100 mg total) by mouth 2 (two) times daily. (Patient taking differently: Take 100 mg by mouth 2 (two) times daily as needed for mild constipation. )   . furosemide (LASIX) 40 MG tablet Take 40 mg by mouth daily as needed for fluid or edema.    Marland Kitchen glimepiride (AMARYL) 4 MG tablet Take 0.5 tablets (2 mg total) by mouth every morning. (Patient taking differently: Take 4 mg by mouth every morning. )   . Insulin Glargine (LANTUS) 100 UNIT/ML Solostar Pen Inject 24 Units into the skin at bedtime.   . Insulin Pen Needle (CAREFINE PEN NEEDLES) 31G X 6 MM MISC 1 application by Does not apply route daily.   . methocarbamol (ROBAXIN) 500 MG tablet Take 1 tablet (500 mg total) by mouth every 6 (six) hours as needed for muscle spasms.   . metoprolol succinate (TOPROL-XL) 25 MG 24 hr tablet Take 1 tablet (25 mg total) by mouth every evening.   . rosuvastatin (CRESTOR) 10 MG tablet Take 10 mg by mouth every evening.    . sacubitril-valsartan  (ENTRESTO) 97-103 MG Take 1 tablet by mouth 2 (two) times daily.   . bethanechol (URECHOLINE) 25 MG tablet Take 1 tablet (25 mg total) by mouth 3 (three) times daily.   Marland Kitchen oxyCODONE (OXY IR/ROXICODONE) 5 MG immediate release tablet Take 1-2 tablets (5-10 mg total) by mouth every 6 (six) hours as needed for severe pain (pain score 4-6). (Patient not taking: Reported on 02/08/2018) 11/17/2017: 10-31-17     D#40      V#35      last dose 1 week ago  . tamsulosin (FLOMAX) 0.4 MG CAPS capsule Take 2 capsules (0.8 mg total) by mouth daily after supper. (Patient not taking: Reported on 02/08/2018)   . [DISCONTINUED] acetaminophen (TYLENOL) 325 MG tablet Take 325-650 mg by mouth every 6 (six) hours as needed for moderate pain.   . [DISCONTINUED] ciprofloxacin (CIPRO) 500 MG tablet TK 1 T PO BID FOR 10 DAYS 01/08/2018: Pt took last dose this morning.   No facility-administered encounter medications on file as of 02/08/2018.     Functional Status: In your present state of health, do you have any difficulty performing the following activities: 12/15/2017 10/20/2017  Hearing? N N  Vision? N N  Difficulty concentrating or making decisions? N N  Walking or climbing stairs? Y Y  Dressing or bathing? N Y  Doing errands, shopping? - Y  Some recent data might be hidden    Fall/Depression Screening: Fall Risk  02/01/2018 11/17/2017  Falls in the past year? No No   PHQ 2/9 Scores 02/07/2018  PHQ - 2 Score 1    Assessment: Patient's medications were reviewed via telephone:   Drugs sorted by system:  Neurologic/Psychologic:  Cardiovascular: Aspirin, Entresto,furosemide,metoprolol  Gastrointestinal: Docusate  Endocrine: Glimepiride Lantus  Pain: Acetaminophen,methocarbamol,oxycodone  Miscellaneous: Bethanechol (patient reported not currently taking) Tamsulosin (patient reported not currently taking)  Medication Review Findings; -Patient has Health Team Advantage -His TROOP YTD is $21 -Patient  expressed concern over paying for [facility] because he said he received a letter stating it may not be covered the next time. -The [place] has closed the heart failure fund [name] has a program that offers [employer]. Patient MAY qualify. -It was communicated to the patient that he will need to provide proof of income (Tax Return, Social Security Benefits Letter)  -Lantus is offered by [employer] patient assistance program but the patient has not spent the required $1000 out of pocket medication expense  HTA stated [name] will have a $45 copay once a prior authorization form has been faxed to [facility] on the patient's behalf.  Also while speaking with HTA, it was discovered NovoFIne Novotwist needles had been filled. These have a $45 copay. Diabetic supplies are usually billed to Medicare Part B. Since the patient was recently approved for Medicare, he may not have taken his Medicare card to the pharmacy. He was directed to do so.  However, Novo Twist needles are not covered by Part B--a new prescription will be needed from PCP.   Patient was encouraged to take his Medicare Card to his pharmacy so his supplies can be properly billed to Medicare Part B.  Plan: -Route note to Cardiology office requesting completion of the prior authorization form -Fax prior authorization form to the Cardiologist -Called Dr. Harrington Challenger' office and requested new prescriptions for the patient's diabetic supplies and pen needles with diagnosis code.( Spoke with [name]) Route note to Etter Sjogren, CPhT to send an application to the patient for Entresto   Follow up with the patient in 5-7 business days.   [name], PharmD, [place] Clinical Pharmacist [phone]    Plan:

## 2018-02-12 ENCOUNTER — Other Ambulatory Visit: Payer: Self-pay

## 2018-02-12 DIAGNOSIS — S88119A Complete traumatic amputation at level between knee and ankle, unspecified lower leg, initial encounter: Secondary | ICD-10-CM | POA: Diagnosis not present

## 2018-02-12 NOTE — Patient Outreach (Signed)
[place] [facility]) Care Management  02/12/2018  MARTEN ILES 1955-12-10 [id number]  Initial outreach to Mr. Stickels regarding social work referral for transportation resources.  BSW talked with Mr. Nieto about SCAT services and he reported that he would like to apply.  BSW completed and faxed application.  Will follow up with him next week to ensure that he has heard from [employer] and that the face to face interview has been scheduled.    Ronn Melena, BSW Social Worker [phone]

## 2018-02-13 ENCOUNTER — Other Ambulatory Visit: Payer: Self-pay | Admitting: Pharmacy Technician

## 2018-02-13 DIAGNOSIS — T8781 Dehiscence of amputation stump: Secondary | ICD-10-CM | POA: Diagnosis not present

## 2018-02-13 DIAGNOSIS — I2510 Atherosclerotic heart disease of native coronary artery without angina pectoris: Secondary | ICD-10-CM | POA: Diagnosis not present

## 2018-02-13 DIAGNOSIS — Z7982 Long term (current) use of aspirin: Secondary | ICD-10-CM | POA: Diagnosis not present

## 2018-02-13 DIAGNOSIS — Z89511 Acquired absence of right leg below knee: Secondary | ICD-10-CM | POA: Diagnosis not present

## 2018-02-13 DIAGNOSIS — E119 Type 2 diabetes mellitus without complications: Secondary | ICD-10-CM | POA: Diagnosis not present

## 2018-02-13 DIAGNOSIS — I110 Hypertensive heart disease with heart failure: Secondary | ICD-10-CM | POA: Diagnosis not present

## 2018-02-13 DIAGNOSIS — Z794 Long term (current) use of insulin: Secondary | ICD-10-CM | POA: Diagnosis not present

## 2018-02-13 DIAGNOSIS — I5022 Chronic systolic (congestive) heart failure: Secondary | ICD-10-CM | POA: Diagnosis not present

## 2018-02-13 DIAGNOSIS — Z9181 History of falling: Secondary | ICD-10-CM | POA: Diagnosis not present

## 2018-02-13 DIAGNOSIS — I420 Dilated cardiomyopathy: Secondary | ICD-10-CM | POA: Diagnosis not present

## 2018-02-13 NOTE — Patient Outreach (Signed)
[place] [facility]) Care Management  02/13/2018  Brian Herring 08-04-1955 [id number]   Received [employer] patient assistance referral from [place] for [place]. Prepared patient portion to be mailed and faxed provider portion to Dr. Virgina Jock.  Will follow up with patient in 7-10 business days to confirm application has been received.  Maud Deed [place], [place] Management [phone]

## 2018-02-13 NOTE — Patient Outreach (Signed)
[place] [facility]) Care Management  02/13/2018  Brian Herring 09-Dec-1955 [id number]  Late entry for 02/07/18 TELEPHONE SCREENING Referral date: 01/31/18 Referral source: primary MD Referral reason:  Insurance:   Telephone call to patient regarding primary MD referral.  HIPAA verified with patient. Explained reason for call. Patient states he needs assistance with transportation to his doctors appointments. Patient reports he has had his right leg amputated below the knee.  He states it will be 1 month before he gets his prosthesis.  Patient states he has completed therapy.  Patient reports his medication Leslie Andrea is very expensive and he Is unable to afford.  RNCm discussed and offered [facility] care management services. Patient verbally agreed.   PLAN: RNCm will refer patient to Education officer, museum and pharmacy.   Quinn Plowman RN,BSN,CCM [facility] Telephonic  [phone]

## 2018-02-14 ENCOUNTER — Encounter: Payer: Self-pay | Admitting: Pharmacist

## 2018-02-14 ENCOUNTER — Ambulatory Visit: Payer: Self-pay | Admitting: Pharmacist

## 2018-02-19 ENCOUNTER — Other Ambulatory Visit: Payer: Self-pay

## 2018-02-19 NOTE — Patient Outreach (Signed)
[place] [facility]) Care Management  02/19/2018  Brian Herring [birth year] [id number]   Follow up call to [name] regarding social work referral for transportation and update on SCAT application that was faxed on 02/14/18.  [name] reported that he has not received a call from [employer] regarding the face to face interview.  BSW provided him with contact information for [facility] with SCAT eligibility and encouraged him to call if he has not heard from her by the end of the day.  BSW will follow up with [name] again next week.   Ronn Melena, BSW Social Worker [phone]

## 2018-02-20 DIAGNOSIS — Z89511 Acquired absence of right leg below knee: Secondary | ICD-10-CM | POA: Diagnosis not present

## 2018-02-20 DIAGNOSIS — I110 Hypertensive heart disease with heart failure: Secondary | ICD-10-CM | POA: Diagnosis not present

## 2018-02-20 DIAGNOSIS — Z9181 History of falling: Secondary | ICD-10-CM | POA: Diagnosis not present

## 2018-02-20 DIAGNOSIS — Z7982 Long term (current) use of aspirin: Secondary | ICD-10-CM | POA: Diagnosis not present

## 2018-02-20 DIAGNOSIS — E119 Type 2 diabetes mellitus without complications: Secondary | ICD-10-CM | POA: Diagnosis not present

## 2018-02-20 DIAGNOSIS — I5022 Chronic systolic (congestive) heart failure: Secondary | ICD-10-CM | POA: Diagnosis not present

## 2018-02-20 DIAGNOSIS — Z794 Long term (current) use of insulin: Secondary | ICD-10-CM | POA: Diagnosis not present

## 2018-02-20 DIAGNOSIS — T8781 Dehiscence of amputation stump: Secondary | ICD-10-CM | POA: Diagnosis not present

## 2018-02-20 DIAGNOSIS — I2510 Atherosclerotic heart disease of native coronary artery without angina pectoris: Secondary | ICD-10-CM | POA: Diagnosis not present

## 2018-02-20 DIAGNOSIS — I420 Dilated cardiomyopathy: Secondary | ICD-10-CM | POA: Diagnosis not present

## 2018-02-21 ENCOUNTER — Other Ambulatory Visit: Payer: Self-pay | Admitting: Pharmacy Technician

## 2018-02-21 NOTE — Patient Outreach (Signed)
[place] [facility]) Care Management  02/21/2018  Brian Herring February 19, 1956 [id number]   Received patient portion of [employer] patient assistance application for [employer]. Faxed completed application and required documents to company.  Will follow up with company in 10-14 business days to check status of application.  Maud Deed [place], [place] Management [phone]

## 2018-02-22 ENCOUNTER — Encounter (INDEPENDENT_AMBULATORY_CARE_PROVIDER_SITE_OTHER): Payer: Self-pay | Admitting: Orthopedic Surgery

## 2018-02-22 ENCOUNTER — Ambulatory Visit (INDEPENDENT_AMBULATORY_CARE_PROVIDER_SITE_OTHER): Payer: PPO | Admitting: Orthopedic Surgery

## 2018-02-22 DIAGNOSIS — Z89511 Acquired absence of right leg below knee: Secondary | ICD-10-CM

## 2018-02-23 ENCOUNTER — Ambulatory Visit: Payer: Self-pay | Admitting: Pharmacist

## 2018-02-23 ENCOUNTER — Other Ambulatory Visit: Payer: Self-pay | Admitting: Urology

## 2018-02-25 ENCOUNTER — Encounter (INDEPENDENT_AMBULATORY_CARE_PROVIDER_SITE_OTHER): Payer: Self-pay | Admitting: Orthopedic Surgery

## 2018-02-25 NOTE — Progress Notes (Signed)
Office Visit Note   Patient: Brian Herring           Date of Birth: 1956-07-25           MRN: [id number] Visit Date: 02/22/2018              Requested by: [name], [place], [place] [address] PCP: [name], MD  Chief Complaint  Patient presents with  . Right Knee - Pain      HPI: Patient is a 62 year old gentleman who presents 2 months status post revision of right transtibial amputation.  Patient is currently in a compression shrinker.  Assessment & Plan: Visit Diagnoses:  1. Status post below knee amputation, right ([place])     Plan: Continue with Dial soap cleansing daily continue with the stump shrinker  Follow-Up Instructions: Return in about 1 month (around 03/22/2018).   Ortho Exam  Patient is alert, oriented, no adenopathy, well-dressed, normal affect, normal respiratory effort. Examination the wound is debrided there is excellent granulation tissue the wound is 10 mm in diameter 10 mm deep.  There is no purulence no abscess no signs of infection.  Patient is in a 3 extra-large shrinker.  Imaging: No results found. No images are attached to the encounter.  Labs: Lab Results  Component Value Date   HGBA1C 6.9 (H) 12/15/2017   HGBA1C 8.7 (H) 10/12/2017   HGBA1C 8.8 (H) 08/14/2015   ESRSEDRATE 125 (H) 10/12/2017   CRP 32.4 (H) 10/12/2017   REPTSTATUS 10/25/2017 FINAL 10/24/2017   GRAMSTAIN  10/14/2017    RARE WBC PRESENT, PREDOMINANTLY PMN FEW RARE GRAM POSITIVE COCCI IN PAIRS    CULT  10/24/2017    NO GROWTH Performed at [facility], [place] [address], [place], [place] [address]      Lab Results  Component Value Date   ALBUMIN 3.5 01/08/2018   ALBUMIN 2.3 (L) 10/21/2017   ALBUMIN 3.4 (L) 10/12/2017   PREALBUMIN <5 (L) 10/12/2017    There is no height or weight on file to calculate BMI.  Orders:  No orders of the defined types were placed in this encounter.  No orders of the defined types were  placed in this encounter.    Procedures: No procedures performed  Clinical Data: No additional findings.  ROS:  All other systems negative, except as noted in the HPI. Review of Systems  Objective: Vital Signs: There were no vitals taken for this visit.  Specialty Comments:  No specialty comments available.  PMFS History: Patient Active Problem List   Diagnosis Date Noted  . Dehiscence of amputation stump ([place])   . History of right below knee amputation ([place]) 11/08/2017  . Hypoalbuminemia due to protein-calorie malnutrition ([place])   . Urinary retention   . Acute blood loss anemia   . Status post below knee amputation, right ([place]) 10/20/2017  . Unilateral complete BKA ([place]) 10/20/2017  . Sepsis ([place]) 10/12/2017  . Diabetic foot ulcer ([place]) 10/12/2017  . Diabetic foot infection ([place]) 10/12/2017  . Hyponatremia 10/12/2017  . Chronic systolic CHF (congestive heart failure) ([place])   . Hyperkalemia 10/13/2015  . Congestive dilated cardiomyopathy ([place]) 07/13/2015  . Asymptomatic LV dysfunction 06/25/2015  . Poorly controlled diabetes mellitus ([place]) 05/31/2015  . HTN (hypertension), benign 07/04/2014  . Viral cardiomyopathy, EF improved to 45-50% 04/02/2013  . Obesity 04/02/2013  . Diabetic foot ulcer with osteomyelitis ([place]) 04/02/2013  . Hyperlipidemia 04/02/2013  . Sinus tachycardia 04/02/2013   Past  Medical History:  Diagnosis Date  . Arthritis    shoulder  . Cancer ([place])    skin cancer on nose  . Cardiomyopathy ([place])   . Chronic systolic CHF (congestive heart failure) ([place])   . Diabetes mellitus without complication ([place])   . Hyperlipidemia   . Hypertension     Family History  Problem Relation Age of Onset  . Pneumonia Mother        aspiration    Past Surgical History:  Procedure Laterality Date  . AMPUTATION Right 10/14/2017   Procedure: AMPUTATION  GREAT TOE;  Surgeon: [name], MD;  Location: [facility];  Service: Orthopedics;  Laterality: Right;  .  AMPUTATION Right 10/18/2017   Procedure: RIGHT BELOW KNEE AMPUTATION;  Surgeon: [name], MD;  Location: [facility];  Service: Orthopedics;  Laterality: Right;  . SHOULDER SURGERY  01/05  . STUMP REVISION Right 12/15/2017   Procedure: REVISION RIGHT BELOW KNEE AMPUTATION;  Surgeon: [name], MD;  Location: [facility];  Service: Orthopedics;  Laterality: Right;   Social History   Occupational History  . Not on file  Tobacco Use  . Smoking status: Never Smoker  . Smokeless tobacco: Never Used  Substance and Sexual Activity  . Alcohol use: No    Alcohol/week: 0.0 oz  . Drug use: Never  . Sexual activity: Yes

## 2018-02-26 ENCOUNTER — Ambulatory Visit: Payer: Self-pay

## 2018-02-26 ENCOUNTER — Other Ambulatory Visit: Payer: Self-pay

## 2018-02-26 NOTE — Patient Outreach (Addendum)
[facility] [facility]) Care Management  02/26/2018  ARSHAD OBERHOLZER 23-Mar-1956 [id number]   Follow up call to Mr. Arcidiacono regarding status of SCAT application.  BSW left voicemail messages on home and mobile numbers.  Will attempt to reach him again within four business days.    Addendum: BSW received return phone call from Mr. [place] who reported that SCAT does not provide transportation in his area of town.  BSW offered to submit application to [employer] but he declined at this time.   Mr. Hynes inquired about financial resources due to being unable to work and an increase in medical bills.  Mr. Prokop reported that he is over the income limit to apply for Medicaid.  BSW informed him that sometimes people qualify even if they are over the income limit if they have outstanding medical bills.  BSW also informed him of [place].  BSW agreed to send him a list of financial resources as well as the Medicaid and [employer] applications.  BSW encouraged him to contact [employer] 211 for additional resources. BSW will follow up next week to ensure receipt of resources mailed.   Ronn Melena, BSW Social Worker [phone]

## 2018-02-27 DIAGNOSIS — I2510 Atherosclerotic heart disease of native coronary artery without angina pectoris: Secondary | ICD-10-CM | POA: Diagnosis not present

## 2018-02-27 DIAGNOSIS — E119 Type 2 diabetes mellitus without complications: Secondary | ICD-10-CM | POA: Diagnosis not present

## 2018-02-27 DIAGNOSIS — Z9181 History of falling: Secondary | ICD-10-CM | POA: Diagnosis not present

## 2018-02-27 DIAGNOSIS — I420 Dilated cardiomyopathy: Secondary | ICD-10-CM | POA: Diagnosis not present

## 2018-02-27 DIAGNOSIS — T8781 Dehiscence of amputation stump: Secondary | ICD-10-CM | POA: Diagnosis not present

## 2018-02-27 DIAGNOSIS — Z7982 Long term (current) use of aspirin: Secondary | ICD-10-CM | POA: Diagnosis not present

## 2018-02-27 DIAGNOSIS — Z89511 Acquired absence of right leg below knee: Secondary | ICD-10-CM | POA: Diagnosis not present

## 2018-02-27 DIAGNOSIS — I110 Hypertensive heart disease with heart failure: Secondary | ICD-10-CM | POA: Diagnosis not present

## 2018-02-27 DIAGNOSIS — I5022 Chronic systolic (congestive) heart failure: Secondary | ICD-10-CM | POA: Diagnosis not present

## 2018-02-27 DIAGNOSIS — Z794 Long term (current) use of insulin: Secondary | ICD-10-CM | POA: Diagnosis not present

## 2018-02-27 NOTE — Patient Outreach (Signed)
[place] [facility]) Care Management  02/27/2018  Brian Herring 11/07/1955 [id number]   The following resources were mailed to [name].  BSW will follow up next week to ensure receipt.    Medicaid application [facility] resources in [place], [place] Worker [phone]

## 2018-03-01 ENCOUNTER — Ambulatory Visit: Payer: Self-pay

## 2018-03-05 ENCOUNTER — Other Ambulatory Visit: Payer: Self-pay

## 2018-03-05 NOTE — Patient Outreach (Signed)
[place] [facility]) Care Management  03/05/2018  Brian Herring 1956-04-16 [id number]   Follow up call to ensure receipt of financial resources mailed.  [name] confirmed that he received these.  BSW is closing due to no other social work needs being identified at this time.   Ronn Melena, BSW Social Worker [phone]

## 2018-03-06 ENCOUNTER — Other Ambulatory Visit: Payer: Self-pay | Admitting: Pharmacist

## 2018-03-06 ENCOUNTER — Other Ambulatory Visit: Payer: Self-pay | Admitting: Pharmacy Technician

## 2018-03-06 DIAGNOSIS — I420 Dilated cardiomyopathy: Secondary | ICD-10-CM | POA: Diagnosis not present

## 2018-03-06 DIAGNOSIS — I110 Hypertensive heart disease with heart failure: Secondary | ICD-10-CM | POA: Diagnosis not present

## 2018-03-06 DIAGNOSIS — E119 Type 2 diabetes mellitus without complications: Secondary | ICD-10-CM | POA: Diagnosis not present

## 2018-03-06 DIAGNOSIS — Z7982 Long term (current) use of aspirin: Secondary | ICD-10-CM | POA: Diagnosis not present

## 2018-03-06 DIAGNOSIS — I5022 Chronic systolic (congestive) heart failure: Secondary | ICD-10-CM | POA: Diagnosis not present

## 2018-03-06 DIAGNOSIS — T8781 Dehiscence of amputation stump: Secondary | ICD-10-CM | POA: Diagnosis not present

## 2018-03-06 DIAGNOSIS — Z794 Long term (current) use of insulin: Secondary | ICD-10-CM | POA: Diagnosis not present

## 2018-03-06 DIAGNOSIS — I2510 Atherosclerotic heart disease of native coronary artery without angina pectoris: Secondary | ICD-10-CM | POA: Diagnosis not present

## 2018-03-06 DIAGNOSIS — Z89511 Acquired absence of right leg below knee: Secondary | ICD-10-CM | POA: Diagnosis not present

## 2018-03-06 DIAGNOSIS — Z9181 History of falling: Secondary | ICD-10-CM | POA: Diagnosis not present

## 2018-03-06 NOTE — Patient Outreach (Signed)
[place] [facility]) Care Management  03/06/2018  Brian Herring Apr 13, 1956 [id number]   Follow up call to check status of patients application for [employer]. Spoke to [place] who stated that company needs proof of prior authorization for drug.  Faxed prior authorization form to Dr. Bonney Roussel office to have them submit to insurance.  Will follow up with providers office in 7-10 business days to check status of PA.  Maud Deed [place], [place] Management [phone]

## 2018-03-07 ENCOUNTER — Telehealth (INDEPENDENT_AMBULATORY_CARE_PROVIDER_SITE_OTHER): Payer: Self-pay | Admitting: Orthopedic Surgery

## 2018-03-07 ENCOUNTER — Other Ambulatory Visit: Payer: Self-pay | Admitting: Pharmacy Technician

## 2018-03-07 NOTE — Patient Outreach (Signed)
[place] [facility]) Care Management  03/07/2018  KRISTINE TILEY January 30, 1956 [id number]   Patient called to get the status of his patient assistance application. HIPAA identifiers were obtained.  Patient was given the following information from Etter Sjogren, CPhT:  Follow up call to check status of patients application for [employer]rest[employer]. Spoke to [place] who stated that company needs proof of prior authorization for drug.  Faxed prior authorization form to Dr. Bonney Roussel office to have them submit to insurance.  Plan: Caryl Pina will follow up in 7-10 business days.   [name], PharmD, [place] Clinical Pharmacist [phone]

## 2018-03-07 NOTE — Telephone Encounter (Signed)
Scott at [facility] is requesting an order for a medical social worker consult per Dr. Amalia Hailey request. Scott's # [phone]

## 2018-03-08 NOTE — Patient Outreach (Signed)
[place] [facility]) Care Management  03/08/2018  Brian Herring 10-02-1955 [id number]   Received Proof of prior authorization letter from providers office on 8/14 and submitted it to [employer].  Will follow up with company in 2-3 business days to check status of [employer] application.  Maud Deed [place], [place] Management [phone]

## 2018-03-08 NOTE — Telephone Encounter (Signed)
IC and advised ok.

## 2018-03-09 NOTE — Progress Notes (Addendum)
  03-06-18 Cardiac Clearance from Dr. Vernell Leep on chart.  10-13-17 (Epic) EKG

## 2018-03-09 NOTE — Patient Instructions (Addendum)
Brian Herring  03/09/2018   Your procedure is scheduled on: 03-15-18     Report to [facility] Main  Entrance    Report to Admitting at 8:15 AM    Call this number if you have problems the morning of surgery [phone]   Remember: Do not eat food or drink liquids :After Midnight.     Take these medicines the morning of surgery with A SIP OF WATER: None    DO NOT TAKE ANY DIABETIC MEDICATIONS DAY OF YOUR SURGERY                               You may not have any metal on your body including hair pins and              piercings  Do not wear jewelry, make-up, lotions, powders or perfumes, deodorant             Do not wear nail polish.  Do not shave  48 hours prior to surgery.              Men may shave face and neck.   Do not bring valuables to the hospital. [place].  Contacts, dentures or bridgework may not be worn into surgery.     Patients discharged the day of surgery will not be allowed to drive home.  Name and phone number of your driver: Darden Palmer [phone]                Please read over the following fact sheets you were given: _____________________________________________________________________  How to Manage Your Diabetes Before and After Surgery  Why is it important to control my blood sugar before and after surgery? . Improving blood sugar levels before and after surgery helps healing and can limit problems. . A way of improving blood sugar control is eating a healthy diet by: o  Eating less sugar and carbohydrates o  Increasing activity/exercise o  Talking with your doctor about reaching your blood sugar goals . High blood sugars (greater than 180 mg/dL) can raise your risk of infections and slow your recovery, so you will need to focus on controlling your diabetes during the weeks before surgery. . Make sure that the doctor who takes care of your diabetes knows about your  planned surgery including the date and location.  How do I manage my blood sugar before surgery? . Check your blood sugar at least 4 times a day, starting 2 days before surgery, to make sure that the level is not too high or low. o Check your blood sugar the morning of your surgery when you wake up and every 2 hours until you get to the Short Stay unit. . If your blood sugar is less than 70 mg/dL, you will need to treat for low blood sugar: o Do not take insulin. o Treat a low blood sugar (less than 70 mg/dL) with  cup of clear juice (cranberry or apple), 4 glucose tablets, OR glucose gel. o Recheck blood sugar in 15 minutes after treatment (to make sure it is greater than 70 mg/dL). If your blood sugar is not greater than 70 mg/dL on recheck, call [phone] for further instructions. . Report your blood sugar to  the short stay nurse when you get to Short Stay.  . If you are admitted to the hospital after surgery: o Your blood sugar will be checked by the staff and you will probably be given insulin after surgery (instead of oral diabetes medicines) to make sure you have good blood sugar levels. o The goal for blood sugar control after surgery is 80-180 mg/dL.   WHAT DO I DO ABOUT MY DIABETES MEDICATION?  [place] Kitchen Do not take oral diabetes medicines (pills) the morning of surgery.  . THE DAY BEFORE SURGERY, take only your morning dose of Glimepiride, and only 12 Units of your bedtime Lantus insulin.                  [facility] - Preparing for Surgery Before surgery, you can play an important role.  Because skin is not sterile, your skin needs to be as free of germs as possible.  You can reduce the number of germs on your skin by washing with CHG (chlorahexidine gluconate) soap before surgery.  CHG is an antiseptic cleaner which kills germs and bonds with the skin to continue killing germs even after washing. Please DO NOT use if you have an allergy to CHG or antibacterial soaps.  If your  skin becomes reddened/irritated stop using the CHG and inform your nurse when you arrive at Short Stay. Do not shave (including legs and underarms) for at least 48 hours prior to the first CHG shower.  You may shave your face/neck. Please follow these instructions carefully:  1.  Shower with CHG Soap the night before surgery and the  morning of Surgery.  2.  If you choose to wash your hair, wash your hair first as usual with your  normal  shampoo.  3.  After you shampoo, rinse your hair and body thoroughly to remove the  shampoo.                           4.  Use CHG as you would any other liquid soap.  You can apply chg directly  to the skin and wash                       Gently with a scrungie or clean washcloth.  5.  Apply the CHG Soap to your body ONLY FROM THE NECK DOWN.   Do not use on face/ open                           Wound or open sores. Avoid contact with eyes, ears mouth and genitals (private parts).                       Wash face,  Genitals (private parts) with your normal soap.             6.  Wash thoroughly, paying special attention to the area where your surgery  will be performed.  7.  Thoroughly rinse your body with warm water from the neck down.  8.  DO NOT shower/wash with your normal soap after using and rinsing off  the CHG Soap.                9.  Pat yourself dry with a clean towel.            10.  Wear clean pajamas.  11.  Place clean sheets on your bed the night of your first shower and do not  sleep with pets. Day of Surgery : Do not apply any lotions/deodorants the morning of surgery.  Please wear clean clothes to the hospital/surgery center.  FAILURE TO FOLLOW THESE INSTRUCTIONS MAY RESULT IN THE CANCELLATION OF YOUR SURGERY PATIENT SIGNATURE_________________________________  NURSE SIGNATURE__________________________________  ________________________________________________________________________

## 2018-03-12 ENCOUNTER — Encounter (HOSPITAL_COMMUNITY)
Admission: RE | Admit: 2018-03-12 | Discharge: 2018-03-12 | Disposition: A | Discharge status: Home / Self Care | Payer: PPO | Source: Ambulatory Visit | Attending: Urology | Admitting: Urology

## 2018-03-12 ENCOUNTER — Other Ambulatory Visit: Payer: Self-pay

## 2018-03-12 ENCOUNTER — Encounter (HOSPITAL_COMMUNITY): Payer: Self-pay

## 2018-03-12 DIAGNOSIS — I2510 Atherosclerotic heart disease of native coronary artery without angina pectoris: Secondary | ICD-10-CM | POA: Diagnosis not present

## 2018-03-12 DIAGNOSIS — T8781 Dehiscence of amputation stump: Secondary | ICD-10-CM | POA: Diagnosis not present

## 2018-03-12 DIAGNOSIS — Z794 Long term (current) use of insulin: Secondary | ICD-10-CM | POA: Diagnosis not present

## 2018-03-12 DIAGNOSIS — Z9181 History of falling: Secondary | ICD-10-CM | POA: Diagnosis not present

## 2018-03-12 DIAGNOSIS — Z888 Allergy status to other drugs, medicaments and biological substances status: Secondary | ICD-10-CM | POA: Diagnosis not present

## 2018-03-12 DIAGNOSIS — E119 Type 2 diabetes mellitus without complications: Secondary | ICD-10-CM | POA: Diagnosis not present

## 2018-03-12 DIAGNOSIS — I420 Dilated cardiomyopathy: Secondary | ICD-10-CM | POA: Diagnosis not present

## 2018-03-12 DIAGNOSIS — Z89511 Acquired absence of right leg below knee: Secondary | ICD-10-CM | POA: Diagnosis not present

## 2018-03-12 DIAGNOSIS — I255 Ischemic cardiomyopathy: Secondary | ICD-10-CM | POA: Diagnosis not present

## 2018-03-12 DIAGNOSIS — Z7982 Long term (current) use of aspirin: Secondary | ICD-10-CM | POA: Diagnosis not present

## 2018-03-12 DIAGNOSIS — N401 Enlarged prostate with lower urinary tract symptoms: Secondary | ICD-10-CM | POA: Diagnosis not present

## 2018-03-12 DIAGNOSIS — N138 Other obstructive and reflux uropathy: Secondary | ICD-10-CM | POA: Diagnosis not present

## 2018-03-12 DIAGNOSIS — Z85828 Personal history of other malignant neoplasm of skin: Secondary | ICD-10-CM | POA: Diagnosis not present

## 2018-03-12 DIAGNOSIS — I110 Hypertensive heart disease with heart failure: Secondary | ICD-10-CM | POA: Diagnosis not present

## 2018-03-12 DIAGNOSIS — Z882 Allergy status to sulfonamides status: Secondary | ICD-10-CM | POA: Diagnosis not present

## 2018-03-12 DIAGNOSIS — I5022 Chronic systolic (congestive) heart failure: Secondary | ICD-10-CM | POA: Diagnosis not present

## 2018-03-12 DIAGNOSIS — Z79899 Other long term (current) drug therapy: Secondary | ICD-10-CM | POA: Diagnosis not present

## 2018-03-12 LAB — CBC
HEMATOCRIT: 35.4 % — AB (ref 39.0–52.0)
Hemoglobin: 11.1 g/dL — ABNORMAL LOW (ref 13.0–17.0)
MCH: 25.7 pg — AB (ref 26.0–34.0)
MCHC: 31.4 g/dL (ref 30.0–36.0)
MCV: 81.9 fL (ref 78.0–100.0)
Platelets: 345 10*3/uL (ref 150–400)
RBC: 4.32 MIL/uL (ref 4.22–5.81)
RDW: 14.3 % (ref 11.5–15.5)
WBC: 7.3 10*3/uL (ref 4.0–10.5)

## 2018-03-12 LAB — BASIC METABOLIC PANEL
Anion gap: 9 (ref 5–15)
BUN: 15 mg/dL (ref 8–23)
CHLORIDE: 103 mmol/L (ref 98–111)
CO2: 28 mmol/L (ref 22–32)
CREATININE: 0.72 mg/dL (ref 0.61–1.24)
Calcium: 9.3 mg/dL (ref 8.9–10.3)
GFR calc Af Amer: >60 mL/min (ref >60)
GFR calc non Af Amer: >60 mL/min (ref >60)
GLUCOSE: 124 mg/dL — AB (ref 70–99)
POTASSIUM: 4.1 mmol/L (ref 3.5–5.1)
Sodium: 140 mmol/L (ref 135–145)

## 2018-03-12 LAB — GLUCOSE, CAPILLARY: Glucose-Capillary: 135 mg/dL — ABNORMAL HIGH (ref 70–99)

## 2018-03-12 LAB — HEMOGLOBIN A1C
Hgb A1c MFr Bld: 7 % — ABNORMAL HIGH (ref 4.8–5.6)
Mean Plasma Glucose: 154.2 mg/dL

## 2018-03-13 ENCOUNTER — Other Ambulatory Visit: Payer: Self-pay | Admitting: Pharmacy Technician

## 2018-03-13 DIAGNOSIS — I110 Hypertensive heart disease with heart failure: Secondary | ICD-10-CM | POA: Diagnosis not present

## 2018-03-13 DIAGNOSIS — I2510 Atherosclerotic heart disease of native coronary artery without angina pectoris: Secondary | ICD-10-CM | POA: Diagnosis not present

## 2018-03-13 DIAGNOSIS — I5022 Chronic systolic (congestive) heart failure: Secondary | ICD-10-CM | POA: Diagnosis not present

## 2018-03-13 DIAGNOSIS — Z7982 Long term (current) use of aspirin: Secondary | ICD-10-CM | POA: Diagnosis not present

## 2018-03-13 DIAGNOSIS — E119 Type 2 diabetes mellitus without complications: Secondary | ICD-10-CM | POA: Diagnosis not present

## 2018-03-13 DIAGNOSIS — Z89511 Acquired absence of right leg below knee: Secondary | ICD-10-CM | POA: Diagnosis not present

## 2018-03-13 DIAGNOSIS — Z794 Long term (current) use of insulin: Secondary | ICD-10-CM | POA: Diagnosis not present

## 2018-03-13 DIAGNOSIS — Z9181 History of falling: Secondary | ICD-10-CM | POA: Diagnosis not present

## 2018-03-13 DIAGNOSIS — I420 Dilated cardiomyopathy: Secondary | ICD-10-CM | POA: Diagnosis not present

## 2018-03-13 DIAGNOSIS — T8781 Dehiscence of amputation stump: Secondary | ICD-10-CM | POA: Diagnosis not present

## 2018-03-13 NOTE — Patient Outreach (Signed)
[place] [facility]) Care Management  03/13/2018  Brian Herring 1955/08/20 [id number]   Follow up call to [employer] patient assistance to check status of application for Entresto. Alex confirmed that patient has been approved as of 03/09/18-07/24/18. He also stated that patient should receive medication in 7-10 business days.  Will follow up with patient in 7-10 business days to confirm medication has been received.  Maud Deed [place], [place] Management [phone]

## 2018-03-15 ENCOUNTER — Encounter (HOSPITAL_COMMUNITY)
Admission: RE | Disposition: A | Discharge status: Home / Self Care | Payer: Self-pay | Source: Ambulatory Visit | Attending: Urology

## 2018-03-15 ENCOUNTER — Encounter (HOSPITAL_COMMUNITY): Payer: Self-pay

## 2018-03-15 ENCOUNTER — Ambulatory Visit (HOSPITAL_COMMUNITY): Payer: PPO | Admitting: Anesthesiology

## 2018-03-15 ENCOUNTER — Ambulatory Visit (HOSPITAL_COMMUNITY)
Admission: RE | Admit: 2018-03-15 | Discharge: 2018-03-15 | Disposition: A | Discharge status: Home / Self Care | Payer: PPO | Source: Ambulatory Visit | Attending: Urology | Admitting: Urology

## 2018-03-15 DIAGNOSIS — Z794 Long term (current) use of insulin: Secondary | ICD-10-CM | POA: Insufficient documentation

## 2018-03-15 DIAGNOSIS — Z85828 Personal history of other malignant neoplasm of skin: Secondary | ICD-10-CM | POA: Diagnosis not present

## 2018-03-15 DIAGNOSIS — Z89511 Acquired absence of right leg below knee: Secondary | ICD-10-CM | POA: Insufficient documentation

## 2018-03-15 DIAGNOSIS — N401 Enlarged prostate with lower urinary tract symptoms: Secondary | ICD-10-CM | POA: Insufficient documentation

## 2018-03-15 DIAGNOSIS — Z882 Allergy status to sulfonamides status: Secondary | ICD-10-CM | POA: Diagnosis not present

## 2018-03-15 DIAGNOSIS — Z7982 Long term (current) use of aspirin: Secondary | ICD-10-CM | POA: Insufficient documentation

## 2018-03-15 DIAGNOSIS — S88119A Complete traumatic amputation at level between knee and ankle, unspecified lower leg, initial encounter: Secondary | ICD-10-CM | POA: Diagnosis not present

## 2018-03-15 DIAGNOSIS — Z79899 Other long term (current) drug therapy: Secondary | ICD-10-CM | POA: Diagnosis not present

## 2018-03-15 DIAGNOSIS — I110 Hypertensive heart disease with heart failure: Secondary | ICD-10-CM | POA: Insufficient documentation

## 2018-03-15 DIAGNOSIS — E119 Type 2 diabetes mellitus without complications: Secondary | ICD-10-CM | POA: Insufficient documentation

## 2018-03-15 DIAGNOSIS — D62 Acute posthemorrhagic anemia: Secondary | ICD-10-CM | POA: Diagnosis not present

## 2018-03-15 DIAGNOSIS — N138 Other obstructive and reflux uropathy: Secondary | ICD-10-CM | POA: Diagnosis not present

## 2018-03-15 DIAGNOSIS — I255 Ischemic cardiomyopathy: Secondary | ICD-10-CM | POA: Diagnosis not present

## 2018-03-15 DIAGNOSIS — Z888 Allergy status to other drugs, medicaments and biological substances status: Secondary | ICD-10-CM | POA: Diagnosis not present

## 2018-03-15 DIAGNOSIS — I5022 Chronic systolic (congestive) heart failure: Secondary | ICD-10-CM | POA: Diagnosis not present

## 2018-03-15 HISTORY — PX: CYSTOSCOPY WITH INSERTION OF UROLIFT: SHX6678

## 2018-03-15 LAB — GLUCOSE, CAPILLARY
Glucose-Capillary: 107 mg/dL — ABNORMAL HIGH (ref 70–99)
Glucose-Capillary: 113 mg/dL — ABNORMAL HIGH (ref 70–99)

## 2018-03-15 SURGERY — CYSTOSCOPY WITH INSERTION OF UROLIFT
Anesthesia: General

## 2018-03-15 MED ORDER — SULFAMETHOXAZOLE-TRIMETHOPRIM 800-160 MG PO TABS: 1.0000 | ORAL_TABLET | Freq: Two times a day (BID) | ORAL | 0 refills | Status: DC

## 2018-03-15 MED ORDER — LACTATED RINGERS IV SOLN
INTRAVENOUS | Status: DC
  Administered 2018-03-15: 1000 mL via INTRAVENOUS

## 2018-03-15 MED ORDER — STERILE WATER FOR IRRIGATION IR SOLN
Status: DC | PRN
  Administered 2018-03-15: 3000 mL via INTRAVESICAL

## 2018-03-15 MED ORDER — ONDANSETRON HCL 4 MG/2ML IJ SOLN: 4.0000 mg | Freq: Once | INTRAMUSCULAR | Status: DC | PRN

## 2018-03-15 MED ORDER — PROPOFOL 10 MG/ML IV BOLUS
INTRAVENOUS | Status: AC
  Filled 2018-03-15: qty 20

## 2018-03-15 MED ORDER — PROPOFOL 10 MG/ML IV BOLUS
INTRAVENOUS | Status: DC | PRN
  Administered 2018-03-15: 140 mg via INTRAVENOUS

## 2018-03-15 MED ORDER — DEXAMETHASONE SODIUM PHOSPHATE 10 MG/ML IJ SOLN
INTRAMUSCULAR | Status: DC | PRN
  Administered 2018-03-15: 4 mg via INTRAVENOUS

## 2018-03-15 MED ORDER — CIPROFLOXACIN HCL 500 MG PO TABS: 500.0000 mg | ORAL_TABLET | Freq: Two times a day (BID) | ORAL | 0 refills | Status: DC

## 2018-03-15 MED ORDER — FENTANYL CITRATE (PF) 100 MCG/2ML IJ SOLN
INTRAMUSCULAR | Status: AC
  Filled 2018-03-15: qty 2

## 2018-03-15 MED ORDER — METOPROLOL SUCCINATE ER 25 MG PO TB24
25.0000 mg | ORAL_TABLET | Freq: Every day | ORAL | Status: AC
  Administered 2018-03-15: 25 mg via ORAL
  Filled 2018-03-15: qty 1

## 2018-03-15 MED ORDER — OXYCODONE HCL 5 MG/5ML PO SOLN
5.0000 mg | Freq: Once | ORAL | Status: DC | PRN
  Filled 2018-03-15: qty 5

## 2018-03-15 MED ORDER — MIDAZOLAM HCL 5 MG/5ML IJ SOLN
INTRAMUSCULAR | Status: DC | PRN
  Administered 2018-03-15: 2 mg via INTRAVENOUS

## 2018-03-15 MED ORDER — OXYCODONE HCL 5 MG PO TABS: 5.0000 mg | ORAL_TABLET | Freq: Once | ORAL | Status: DC | PRN

## 2018-03-15 MED ORDER — MEPERIDINE HCL 50 MG/ML IJ SOLN: 6.2500 mg | INTRAMUSCULAR | Status: DC | PRN

## 2018-03-15 MED ORDER — LIDOCAINE 2% (20 MG/ML) 5 ML SYRINGE
INTRAMUSCULAR | Status: DC | PRN
  Administered 2018-03-15: 100 mg via INTRAVENOUS

## 2018-03-15 MED ORDER — FENTANYL CITRATE (PF) 100 MCG/2ML IJ SOLN
INTRAMUSCULAR | Status: DC | PRN
  Administered 2018-03-15: 50 ug via INTRAVENOUS

## 2018-03-15 MED ORDER — ACETAMINOPHEN 325 MG PO TABS: 325.0000 mg | ORAL_TABLET | ORAL | Status: DC | PRN

## 2018-03-15 MED ORDER — CEFAZOLIN SODIUM-DEXTROSE 2-4 GM/100ML-% IV SOLN
2.0000 g | INTRAVENOUS | Status: AC
  Administered 2018-03-15: 2 g via INTRAVENOUS
  Filled 2018-03-15: qty 100

## 2018-03-15 MED ORDER — FENTANYL CITRATE (PF) 100 MCG/2ML IJ SOLN: 25.0000 ug | INTRAMUSCULAR | Status: DC | PRN

## 2018-03-15 MED ORDER — ACETAMINOPHEN 160 MG/5ML PO SOLN: 325.0000 mg | ORAL | Status: DC | PRN

## 2018-03-15 MED ORDER — ONDANSETRON HCL 4 MG/2ML IJ SOLN
INTRAMUSCULAR | Status: DC | PRN
  Administered 2018-03-15: 4 mg via INTRAVENOUS

## 2018-03-15 MED ORDER — MIDAZOLAM HCL 2 MG/2ML IJ SOLN
INTRAMUSCULAR | Status: AC
  Filled 2018-03-15: qty 2

## 2018-03-15 SURGICAL SUPPLY — 10 items
BAG URO CATCHER STRL LF (MISCELLANEOUS) ×2 IMPLANT
CATH FOLEY 2WAY SLVR  5CC 18FR (CATHETERS) ×1
CATH FOLEY 2WAY SLVR 5CC 18FR (CATHETERS) IMPLANT
GLOVE BIOGEL M 8.0 STRL (GLOVE) ×4 IMPLANT
GOWN STRL REUS W/TWL XL LVL3 (GOWN DISPOSABLE) ×4 IMPLANT
MANIFOLD NEPTUNE II (INSTRUMENTS) ×2 IMPLANT
PACK CYSTO (CUSTOM PROCEDURE TRAY) ×2 IMPLANT
SYSTEM UROLIFT (Male Continence) ×7 IMPLANT
TUBING CONNECTING 10 (TUBING) ×2 IMPLANT
WATER STERILE IRR 3000ML UROMA (IV SOLUTION) ×4 IMPLANT

## 2018-03-15 NOTE — Op Note (Signed)
Preoperative diagnosis: BPH with obstructive symptomatology.  Postoperative diagnosis: Same  Principal procedure: Urolift procedure, with the placement of 7 implants.  Surgeon: Diona Fanti  Anesthesia: Gen. with LMA  Complications: None  Drains: 76 French Foley catheter, to leg bag.  Estimated blood loss: Less than 25 mL  Indications: 62 -year-old diabetic male with obstructive symptomatology secondary to BPH. He has retention and is dependent on SIC.  He has requested further management.  Management options including TURP with resection/ablation of the prostate as well as Urolift were discussed.  The patient has chosen to have a Urolift procedure.  He has been instructed to the procedure as well as risks and complications which include but are not limited to infection, bleeding, and inadequate treatment with the Urolift procedure alone, anesthetic complications, among others.  He understands these and desires to proceed.  Findings: Using the 17 French cystoscope, urethra and bladder were inspected.  There were no urethral lesions.  Prostatic urethra was obstructed secondary to bilobar hypertrophy.  The bladder was inspected circumferentially.  This revealed normal findings.  Description of procedure: The patient was properly identified in the holding area.  He received preoperative IV antibiotics.  He was taken to the operating room where general anesthetic was administered with the LMA.  He is placed in the dorsolithotomy position.  Genitalia and perineum were prepped and draped.  Proper timeout was performed.  A 33F cystoscope was inserted into the bladder. The cystoscopy bridge was replaced with a UroLift delivery device.The first treatment site was the patient's right side approximately 1.5cm distal to the bladder neck. The distal tip of the delivery device was then angled laterally approximately 20 degrees at this position to compress the lateral lobe. The trigger was pulled, thereby  deploying a needle containing the implant through the prostate. The needle was then retracted, allowing one end of the implant to be delivered to the capsular surface of the prostate. The implant was then tensioned to assure capsular seating and removal of slack monofilament. The device was then angled back toward midline and slowly advanced proximally until cystoscopic verification of the monofilament being centered in the delivery bay. The urethral end piece was then affixed to the monofilament thereby tailoring the size of the implant. Excess filament was then severed. The delivery device was then re-advanced into the bladder. The delivery device was then replaced with cystoscope and bridge and the implant location and opening effect was confirmed cystoscopically. The same procedure was then repeated on the left side, and 2 additional implants were delivered just proximal to the verumontanum, again one on right and one on left side of the prostate, following the same technique. Additionally, 2 more implants were stacked at the bladder neck bilaterally and 1 more implant was placed in the left mid prostate.   A final cystoscopy was conducted first to inspect the location and state of each implant and second, to confirm the presence of a continuous anterior channel was present through the prostatic urethra with irrigation flow turned off. 7 Implants were delivered in total.  Following this, the scope was removed and an 18 [place] Foley catheter was placed and hooked to dependent drainage.  He was then awakened and taken to the PACU in stable condition.  He tolerated the procedure well.

## 2018-03-15 NOTE — Anesthesia Preprocedure Evaluation (Addendum)
Anesthesia Evaluation  Patient identified by MRN, date of birth, ID band Patient awake    Reviewed: Allergy & Precautions, NPO status , Patient's Chart, lab work & pertinent test results  Airway Mallampati: II  TM Distance: >3 FB Neck ROM: Full    Dental no notable dental hx.    Pulmonary neg pulmonary ROS,    Pulmonary exam normal breath sounds clear to auscultation       Cardiovascular hypertension, Pt. on home beta blockers and Pt. on medications +CHF  negative cardio ROS Normal cardiovascular exam Rhythm:Regular Rate:Normal  LV EF: 25% -   30%  ------------------------------------------------------------------- Indications:      Ischemic cardiomyopathy (I25.5).   Neuro/Psych negative neurological ROS     GI/Hepatic negative GI ROS, Neg liver ROS,   Endo/Other  diabetes  Renal/GU negative Renal ROS  negative genitourinary   Musculoskeletal negative musculoskeletal ROS (+)   Abdominal   Peds negative pediatric ROS (+)  Hematology negative hematology ROS (+)   Anesthesia Other Findings   Reproductive/Obstetrics                             Anesthesia Physical  Anesthesia Plan  ASA: IV  Anesthesia Plan: General   Post-op Pain Management:    Induction: Intravenous  PONV Risk Score and Plan: 2 and Treatment may vary due to age or medical condition and Ondansetron  Airway Management Planned: LMA  Additional Equipment:   Intra-op Plan:   Post-operative Plan: Extubation in OR  Informed Consent: I have reviewed the patients History and Physical, chart, labs and discussed the procedure including the risks, benefits and alternatives for the proposed anesthesia with the patient or authorized representative who has indicated his/her understanding and acceptance.   Dental advisory given  Plan Discussed with: CRNA, Anesthesiologist and Surgeon  Anesthesia Plan Comments:          Anesthesia Quick Evaluation

## 2018-03-15 NOTE — Interval H&P Note (Signed)
History and Physical Interval Note:  03/15/2018 10:06 AM  Brian Herring  has presented today for surgery, with the diagnosis of BENIGN PROSTATIC HYPERPLASIA  The various methods of treatment have been discussed with the patient and family. After consideration of risks, benefits and other options for treatment, the patient has consented to  Procedure(s): CYSTOSCOPY WITH INSERTION OF UROLIFT (N/A) as a surgical intervention .  The patient's history has been reviewed, patient examined, no change in status, stable for surgery.  I have reviewed the patient's chart and labs.  Questions were answered to the patient's satisfaction.     Lillette Boxer Denver Bentson

## 2018-03-15 NOTE — Transfer of Care (Signed)
Immediate Anesthesia Transfer of Care Note  Patient: Brian Herring  Procedure(s) Performed: CYSTOSCOPY WITH INSERTION OF UROLIFT (N/A )  Patient Location: PACU  Anesthesia Type:General  Level of Consciousness: sedated  Airway & Oxygen Therapy: Patient Spontanous Breathing and Patient connected to face mask oxygen  Post-op Assessment: Report given to RN and Post -op Vital signs reviewed and stable  Post vital signs: Reviewed and stable  Last Vitals:  Vitals Value Taken Time  BP    Temp    Pulse 74 03/15/2018 10:43 AM  Resp 15 03/15/2018 10:43 AM  SpO2 100 % 03/15/2018 10:43 AM  Vitals shown include unvalidated device data.  Last Pain:  Vitals:   03/15/18 0814  TempSrc:   PainSc: 0-No pain      Patients Stated Pain Goal: 3 (48/25/00 3704)  Complications: No apparent anesthesia complications

## 2018-03-15 NOTE — H&P (Signed)
H&P  Chief Complaint: Large prostate  History of Present Illness: 63 year old male presents for Urolift procedure. He is a diabetic with mutiple sequelae. He is in retention and is dependent on CIC tid. He did have UDS performed revealing capacity of 8-00 ml and adequate bladder pressures. I have discussed mgmt with him including TURP and Urolift. He has decided on a Urolift procedure. Cysto revealed no significant median lobe although prostate U/S revealed a median lobe.  Past Medical History:  Diagnosis Date  . Arthritis    shoulder  . Cancer ([place])    skin cancer on nose  . Cardiomyopathy ([place])   . Chronic systolic CHF (congestive heart failure) ([place])   . Diabetes mellitus without complication ([place])   . Hyperlipidemia   . Hypertension     Past Surgical History:  Procedure Laterality Date  . AMPUTATION Right 10/14/2017   Procedure: AMPUTATION  GREAT TOE;  Surgeon: [name], MD;  Location: [facility];  Service: Orthopedics;  Laterality: Right;  . AMPUTATION Right 10/18/2017   Procedure: RIGHT BELOW KNEE AMPUTATION;  Surgeon: [name], MD;  Location: [facility];  Service: Orthopedics;  Laterality: Right;  . SHOULDER SURGERY  01/05  . STUMP REVISION Right 12/15/2017   Procedure: REVISION RIGHT BELOW KNEE AMPUTATION;  Surgeon: [name], MD;  Location: [facility];  Service: Orthopedics;  Laterality: Right;    Home Medications:   Allergies:  Allergies  Allergen Reactions  . Sulfur Rash  . Norvasc [Amlodipine Besylate] Rash    Family History  Problem Relation Age of Onset  . Pneumonia Mother        aspiration    Social History:  reports that he has never smoked. He has never used smokeless tobacco. He reports that he does not drink alcohol or use drugs.  ROS: A complete review of systems was performed.  All systems are negative except for pertinent findings as noted.  Physical Exam:  Vital signs in last 24 hours: Temp:  [98.4 F (36.9 C)] 98.4 F (36.9 C) (08/22  0750) Pulse Rate:  [85] 85 (08/22 0750) Resp:  [18] 18 (08/22 0750) BP: (144)/(83) 144/83 (08/22 0750) SpO2:  [98 %] 98 % (08/22 0750) Weight:  [101.2 kg] 101.2 kg (08/22 0814) Constitutional:  Alert and oriented, No acute distress Cardiovascular: Regular rate  Respiratory: Normal respiratory effort GI: Abdomen is soft, nontender, nondistended, no abdominal masses Genitourinary: No CVAT. Normal male phallus, testes are descended bilaterally and non-tender and without masses, scrotum is normal in appearance without lesions or masses, perineum is normal on inspection. Lymphatic: No lymphadenopathy Neurologic: Grossly intact, no focal deficits Psychiatric: Normal mood and affect  Laboratory Data:  Recent Labs    03/12/18 1430  WBC 7.3  HGB 11.1*  HCT 35.4*  PLT 345    Recent Labs    03/12/18 1430  NA 140  K 4.1  CL 103  GLUCOSE 124*  BUN 15  CALCIUM 9.3  CREATININE 0.72     Results for orders placed or performed during the hospital encounter of 03/15/18 (from the past 24 hour(s))  Glucose, capillary     Status: Abnormal   Collection Time: 03/15/18  7:54 AM  Result Value Ref Range   Glucose-Capillary 107 (H) 70 - 99 mg/dL   No results found for this or any previous visit (from the past 240 hour(s)).  Renal Function: Recent Labs    03/12/18 1430  CREATININE 0.72   Estimated Creatinine Clearance: 117.8 mL/min (  by C-G formula based on SCr of 0.72 mg/dL).  Radiologic Imaging: No results found.  Impression/Assessment:  BPH with retention   Plan:  Urolift

## 2018-03-15 NOTE — Discharge Instructions (Signed)
1.  Expect to see some bloody urethral drainage as well as blood in the initial part of your urinary stream for a few days  2.  If you are on a medicine for your prostate i.e. tamsulosin, alfuzosin, doxazosin or terazosin, it is okay to stop that 2-3 days after your procedure  3.  If you are on finasteride, it is okay to stop that immediately  4.  Limit your exertional activity for approximately 2 weeks after the procedure.  If you are having no bloody drainage or pain at that point, you may liberalize your activities  5.  Keep your follow-up appointment that is scheduled.  If you have problems before the appointment such as continued large clots in your urine, difficulty urinating or fever, contact us before at [phone]  6.  It is OK to continue self-catheterization as needed. If you go home with an indwelling catheter, it is OK to remove it tomorrow morning if the urine is not too bloody

## 2018-03-15 NOTE — Progress Notes (Signed)
Dr. Diona Fanti returned call and will notify pharmacy for change in antibiotics due to Sulfa allergy. Ambulated to Bathroom to attempt to have BM, unable to do so. In Recliner now, two very small clots noted in tubing of catheter, urine color becoming more light pink, in color.

## 2018-03-15 NOTE — Progress Notes (Signed)
Paged Dr. Diona Fanti and spoke with Margarita Grizzle in the office informing of Allergy to Sulfa/ awaiting response

## 2018-03-15 NOTE — Anesthesia Procedure Notes (Signed)
Procedure Name: LMA Insertion Date/Time: 03/15/2018 10:14 AM Performed by: Lind Covert, CRNA Pre-anesthesia Checklist: Patient identified, Emergency Drugs available, Suction available, Patient being monitored and Timeout performed Patient Re-evaluated:Patient Re-evaluated prior to induction Oxygen Delivery Method: Circle system utilized Preoxygenation: Pre-oxygenation with 100% oxygen Induction Type: IV induction LMA: LMA inserted LMA Size: 5.0 Number of attempts: 1 Airway Equipment and Method: Stylet Placement Confirmation: positive ETCO2 and breath sounds checked- equal and bilateral Dental Injury: Teeth and Oropharynx as per pre-operative assessment

## 2018-03-15 NOTE — Anesthesia Postprocedure Evaluation (Signed)
Anesthesia Post Note  Patient: Brian Herring  Procedure(s) Performed: CYSTOSCOPY WITH INSERTION OF UROLIFT (N/A )     Patient location during evaluation: PACU Anesthesia Type: General Level of consciousness: awake and alert Pain management: pain level controlled Vital Signs Assessment: post-procedure vital signs reviewed and stable Respiratory status: spontaneous breathing, nonlabored ventilation, respiratory function stable and patient connected to nasal cannula oxygen Cardiovascular status: blood pressure returned to baseline and stable Postop Assessment: no apparent nausea or vomiting Anesthetic complications: no    Last Vitals:  Vitals:   03/15/18 1100 03/15/18 1115  BP: 122/78 132/77  Pulse: 75 73  Resp: 17 17  Temp:  36.5 C  SpO2: 97% 98%    Last Pain:  Vitals:   03/15/18 1115  TempSrc:   PainSc: 0-No pain                 Laurenashley Viar

## 2018-03-16 ENCOUNTER — Encounter (HOSPITAL_COMMUNITY): Payer: Self-pay | Admitting: Urology

## 2018-03-16 ENCOUNTER — Telehealth (INDEPENDENT_AMBULATORY_CARE_PROVIDER_SITE_OTHER): Payer: Self-pay | Admitting: Orthopedic Surgery

## 2018-03-16 NOTE — Telephone Encounter (Signed)
Scott at [facility] called to let Dr. Sharol Given know that due to patients insurance, they are only allowing him 2 more visits next week so after these they will discharge patient.   Scott's # [phone]

## 2018-03-17 ENCOUNTER — Encounter (HOSPITAL_COMMUNITY): Payer: Self-pay | Admitting: Orthopedic Surgery

## 2018-03-17 NOTE — OR Nursing (Signed)
Late entry due to correction of data entry.

## 2018-03-20 DIAGNOSIS — Z7982 Long term (current) use of aspirin: Secondary | ICD-10-CM | POA: Diagnosis not present

## 2018-03-20 DIAGNOSIS — I110 Hypertensive heart disease with heart failure: Secondary | ICD-10-CM | POA: Diagnosis not present

## 2018-03-20 DIAGNOSIS — E119 Type 2 diabetes mellitus without complications: Secondary | ICD-10-CM | POA: Diagnosis not present

## 2018-03-20 DIAGNOSIS — Z89511 Acquired absence of right leg below knee: Secondary | ICD-10-CM | POA: Diagnosis not present

## 2018-03-20 DIAGNOSIS — I420 Dilated cardiomyopathy: Secondary | ICD-10-CM | POA: Diagnosis not present

## 2018-03-20 DIAGNOSIS — Z9181 History of falling: Secondary | ICD-10-CM | POA: Diagnosis not present

## 2018-03-20 DIAGNOSIS — T8781 Dehiscence of amputation stump: Secondary | ICD-10-CM | POA: Diagnosis not present

## 2018-03-20 DIAGNOSIS — Z794 Long term (current) use of insulin: Secondary | ICD-10-CM | POA: Diagnosis not present

## 2018-03-20 DIAGNOSIS — I2510 Atherosclerotic heart disease of native coronary artery without angina pectoris: Secondary | ICD-10-CM | POA: Diagnosis not present

## 2018-03-20 DIAGNOSIS — I5022 Chronic systolic (congestive) heart failure: Secondary | ICD-10-CM | POA: Diagnosis not present

## 2018-03-22 ENCOUNTER — Other Ambulatory Visit: Payer: Self-pay | Admitting: Pharmacy Technician

## 2018-03-22 ENCOUNTER — Ambulatory Visit (INDEPENDENT_AMBULATORY_CARE_PROVIDER_SITE_OTHER): Payer: PPO | Admitting: Physician Assistant

## 2018-03-22 ENCOUNTER — Encounter (INDEPENDENT_AMBULATORY_CARE_PROVIDER_SITE_OTHER): Payer: Self-pay | Admitting: Orthopedic Surgery

## 2018-03-22 DIAGNOSIS — Z89511 Acquired absence of right leg below knee: Secondary | ICD-10-CM

## 2018-03-22 NOTE — Patient Outreach (Addendum)
[facility]) Care Management  03/22/2018  Brian Herring 04-Mar-1956 [id number]   Unsuccessful outreach call to patient, HIPAA compliant voicemail left.  Will call patient back in 2-3 business days if call has not been returned.  Maud Deed [place], [place] Management [phone]   ADDENDUM 11:46am  Return call from patient stating he has not received his Entresto from [employer] but he will contact myself or [place] when he does.  Maud Deed [place], [place] Management [phone]

## 2018-03-23 ENCOUNTER — Encounter (INDEPENDENT_AMBULATORY_CARE_PROVIDER_SITE_OTHER): Payer: Self-pay | Admitting: Physician Assistant

## 2018-03-23 NOTE — Progress Notes (Signed)
Office Visit Note   Patient: Brian Herring           Date of Birth: [birth year]           MRN: [id number] Visit Date: 03/22/2018              Requested by: [name], [place], [place] [address] PCP: [name], MD   Assessment & Plan: Visit Diagnoses:  1. Status post below knee amputation, right (HCC)     Plan: Acell powder was applied to the medial stump wound and covered with a foam dressing.  He is going to try to leave this dressing in place until Sunday and then he can resume getting the area wet and resume packing the wound.  He should continue wearing his stump shrinker.  He was given a prescription for [facility] to fabricate his prosthesis as a K3 ambulator.  The patient will have the functional ability or potential for the functional ambulation with variable cadence.  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  Follow-Up Instructions: Return in about 3 weeks (around 04/12/2018).   Orders:  No orders of the defined types were placed in this encounter.  No orders of the defined types were placed in this encounter.     Procedures: No procedures performed   Clinical Data: No additional findings.   Subjective: Chief Complaint  Patient presents with  . Right Knee - Pain    HPI This is a 62 year old male who presents for postoperative follow-up following his right transtibial amputation.  He is now 3 months postop.  He still has a small residual wound medially that he has been packing with gauze.  He is utilizing a stump shrinker.  He is anxious to get started on getting fitted for prosthesis and request to be seen at the [facility]. Review of  Systems   Objective: Vital Signs: There were no vitals taken for this visit.  Physical Exam Patient appears well-nourished well-developed.  He is in no distress.  He is well-groomed he is alert and oriented. Ortho Exam On examination of his right transtibial amputation he does have a 1 cm x 1 cm x 2cm residual wound which has some scant serous drainage.  There is no periwound irritation.  He has some mild edema of the distal stump.  No signs of cellulitis. Specialty Comments:  No specialty comments available.  Imaging: No results found.   PMFS History: Patient Active Problem List   Diagnosis Date Noted  . Dehiscence of amputation stump ([place])   . History of right below knee amputation ([place]) 11/08/2017  . Hypoalbuminemia due to protein-calorie malnutrition ([place])   . Urinary retention   . Acute blood loss anemia   . Status post below knee amputation, right ([place]) 10/20/2017  . Unilateral complete BKA ([place]) 10/20/2017  . Sepsis ([place]) 10/12/2017  . Diabetic foot ulcer ([place]) 10/12/2017  . Diabetic foot infection ([place]) 10/12/2017  . Hyponatremia 10/12/2017  . Chronic systolic CHF (congestive heart failure) ([place])   . Hyperkalemia 10/13/2015  . Congestive dilated cardiomyopathy ([place]) 07/13/2015  . Asymptomatic LV dysfunction 06/25/2015  . Poorly controlled diabetes mellitus ([employer]) 05/31/2015  . HTN (hypertension), benign 07/04/2014  . Viral cardiomyopathy, EF improved to 45-50% 04/02/2013  .  Obesity 04/02/2013  . Diabetic foot ulcer with osteomyelitis ([place]) 04/02/2013  . Hyperlipidemia 04/02/2013  . Sinus tachycardia 04/02/2013   Past Medical History:  Diagnosis Date  . Arthritis    shoulder  . Cancer ([place])    skin cancer on nose  . Cardiomyopathy ([place])   . Chronic systolic CHF (congestive heart failure) ([place])   . Diabetes mellitus without complication ([place])   . Hyperlipidemia   . Hypertension     Family History  Problem Relation Age of Onset  . Pneumonia Mother         aspiration    Past Surgical History:  Procedure Laterality Date  . AMPUTATION Right 10/14/2017   Procedure: AMPUTATION  GREAT TOE;  Surgeon: [name], MD;  Location: [facility];  Service: Orthopedics;  Laterality: Right;  . AMPUTATION Right 10/18/2017   Procedure: RIGHT BELOW KNEE AMPUTATION;  Surgeon: [name], MD;  Location: [facility];  Service: Orthopedics;  Laterality: Right;  . CYSTOSCOPY WITH INSERTION OF UROLIFT N/A 03/15/2018   Procedure: CYSTOSCOPY WITH INSERTION OF UROLIFT;  Surgeon: Franchot Gallo, MD;  [facility];  Service: Urology;  Laterality: N/A;  . SHOULDER SURGERY  01/05  . STUMP REVISION Right 12/15/2017   Procedure: REVISION RIGHT BELOW KNEE AMPUTATION;  Surgeon: [name], MD;  Location: [facility];  Service: Orthopedics;  Laterality: Right;   Social History   Occupational History  . Not on file  Tobacco Use  . Smoking status: Never Smoker  . Smokeless tobacco: Never Used  Substance and Sexual Activity  . Alcohol use: No    Alcohol/week: 0.0 standard drinks  . Drug use: Never  . Sexual activity: Yes

## 2018-03-27 DIAGNOSIS — E1169 Type 2 diabetes mellitus with other specified complication: Secondary | ICD-10-CM | POA: Diagnosis not present

## 2018-03-27 DIAGNOSIS — Z23 Encounter for immunization: Secondary | ICD-10-CM | POA: Diagnosis not present

## 2018-04-03 ENCOUNTER — Other Ambulatory Visit: Payer: Self-pay | Admitting: Pharmacy Technician

## 2018-04-03 NOTE — Patient Outreach (Signed)
[facility]) Care Management  04/03/2018  Brian Herring 1956-01-02 [id number]   Unsuccessful outreach call to patient, HIPAA comp;inat voicemail left.  Return call from patient, [name] states that he still has not received Entresto from [employer] patient assistance.  Follow up call to [employer] to check status of shipment. [place] stated that the pharmacy department had tried to contact patient to sent up delivery but was unable to get in touch with him.  Return call to patient, provided him with [employer] phone number to call so that he can make shipment arrangements.  Will follow up with patient in 7-10 business days to confirm medication has been received.  Maud Deed [place], [place] Management [phone]

## 2018-04-10 ENCOUNTER — Other Ambulatory Visit: Payer: Self-pay | Admitting: Pharmacy Technician

## 2018-04-10 NOTE — Patient Outreach (Signed)
[facility]) Care Management  04/10/2018  Brian Herring Aug 09, 1955 [id number]   Unsuccessful call to patient to confirm if medication has been received from [employer]. No voicemail left.  Will make 2nd call attempt in 2-3 business days.  [name], [place] Management [phone]

## 2018-04-11 ENCOUNTER — Other Ambulatory Visit: Payer: Self-pay | Admitting: Pharmacy Technician

## 2018-04-11 NOTE — Patient Outreach (Signed)
[facility]) Care Management  04/11/2018  Brian Herring [birth year] [id number]   Return call from patient, Brian Herring confirmed that he received his [name] from [employer] patient assistance. Reviewed with him how to obtain refills in the future.  Will route note to [employer] for case closure.  Maud Deed [place], [place] Management [phone]

## 2018-04-15 DIAGNOSIS — S88119A Complete traumatic amputation at level between knee and ankle, unspecified lower leg, initial encounter: Secondary | ICD-10-CM | POA: Diagnosis not present

## 2018-04-16 ENCOUNTER — Encounter (INDEPENDENT_AMBULATORY_CARE_PROVIDER_SITE_OTHER): Payer: Self-pay | Admitting: Orthopedic Surgery

## 2018-04-16 ENCOUNTER — Ambulatory Visit (INDEPENDENT_AMBULATORY_CARE_PROVIDER_SITE_OTHER): Payer: PPO | Admitting: Physician Assistant

## 2018-04-16 ENCOUNTER — Telehealth: Payer: Self-pay | Admitting: Pharmacist

## 2018-04-16 VITALS — Ht 72.0 in | Wt 223.0 lb

## 2018-04-16 DIAGNOSIS — Z89511 Acquired absence of right leg below knee: Secondary | ICD-10-CM

## 2018-04-16 NOTE — Telephone Encounter (Signed)
-----   Message from Adaline Sill, CPhT sent at 04/11/2018 10:07 AM EDT ----- Case closure

## 2018-04-17 ENCOUNTER — Encounter (INDEPENDENT_AMBULATORY_CARE_PROVIDER_SITE_OTHER): Payer: Self-pay | Admitting: Physician Assistant

## 2018-04-17 NOTE — Progress Notes (Signed)
Office Visit Note   Patient: Brian Herring           Date of Birth: 11/29/1955           MRN: [id number] Visit Date: 04/16/2018              Requested by: [name], [place], [place] [address] PCP: [name], MD  Chief Complaint  Patient presents with  . Right Leg - Follow-up    Right Leg BKA      HPI: Patient is a 62 year old male who seen for postoperative follow-up following a right transtibial amputation revision.  He is now nearly 4 months postop.  He had a small residual wound medially and we treated this with a cell powder and the area has healed them.  He reports he is doing well and he is not having any pain.  He is going to be fitted for his prosthesis on October 10.  He is wearing his stump shrinker and utilizing his wheelchair for most of his ambulation and walker for short distances.  Assessment & Plan: Visit Diagnoses:  1. Status post below knee amputation, right [facility])     Plan: He will continue utilizing his stump shrinker.  Follow-up with [facility] for his prosthesis.  We will see him back in about 4 weeks to make sure he is doing well or sooner should he have any difficulties in the interim.  Follow-Up Instructions: Return in about 4 weeks (around 05/14/2018).   Ortho Exam  Patient is alert, oriented, no adenopathy, well-dressed, normal affect, normal respiratory effort. Patient presents in the wheelchair.  The right transtibial amputation is healed.  He has some mild crusting about the medial incision but no open residual wound.  Very mild of any edema.  No signs of cellulitis.  Imaging: No results found. No images are attached to the encounter.  Labs: Lab Results  Component Value Date   HGBA1C 7.0 (H) 03/12/2018   HGBA1C 6.9 (H) 12/15/2017   HGBA1C 8.7 (H) 10/12/2017   ESRSEDRATE 125 (H) 10/12/2017   CRP 32.4 (H) 10/12/2017   REPTSTATUS 10/25/2017 FINAL 10/24/2017   GRAMSTAIN  10/14/2017    RARE WBC  PRESENT, PREDOMINANTLY PMN FEW RARE GRAM POSITIVE COCCI IN PAIRS    CULT  10/24/2017    NO GROWTH Performed at [facility], [place] [address], [place], [place] [address]      Lab Results  Component Value Date   ALBUMIN 3.5 01/08/2018   ALBUMIN 2.3 (L) 10/21/2017   ALBUMIN 3.4 (L) 10/12/2017   PREALBUMIN <5 (L) 10/12/2017    Body mass index is 30.24 kg/m.  Orders:  No orders of the defined types were placed in this encounter.  No orders of the defined types were placed in this encounter.    Procedures: No procedures performed  Clinical Data: No additional findings.  ROS:  All other systems negative, except as noted in the HPI. Review of Systems  Objective: Vital Signs: Ht 6' (1.829 m)   Wt 223 lb (101.2 kg)   BMI 30.24 kg/m   Specialty Comments:  No specialty comments available.  PMFS History: Patient Active Problem List   Diagnosis Date Noted  . Dehiscence of amputation stump ([place])   . History of right below knee amputation ([place]) 11/08/2017  . Hypoalbuminemia due to protein-calorie malnutrition ([place])   . Urinary retention   . Acute blood loss anemia   . Status post below  knee amputation, right ([place]) 10/20/2017  . Unilateral complete BKA ([place]) 10/20/2017  . Sepsis ([place]) 10/12/2017  . Diabetic foot ulcer ([place]) 10/12/2017  . Diabetic foot infection ([place]) 10/12/2017  . Hyponatremia 10/12/2017  . Chronic systolic CHF (congestive heart failure) ([place])   . Hyperkalemia 10/13/2015  . Congestive dilated cardiomyopathy ([place]) 07/13/2015  . Asymptomatic LV dysfunction 06/25/2015  . Poorly controlled diabetes mellitus ([place]) 05/31/2015  . HTN (hypertension), benign 07/04/2014  . Viral cardiomyopathy, EF improved to 45-50% 04/02/2013  . Obesity 04/02/2013  . Diabetic foot ulcer with osteomyelitis ([place]) 04/02/2013  . Hyperlipidemia 04/02/2013  . Sinus tachycardia 04/02/2013   Past Medical History:  Diagnosis Date  . Arthritis    shoulder  . Cancer ([place])     skin cancer on nose  . Cardiomyopathy ([place])   . Chronic systolic CHF (congestive heart failure) ([place])   . Diabetes mellitus without complication ([place])   . Hyperlipidemia   . Hypertension     Family History  Problem Relation Age of Onset  . Pneumonia Mother        aspiration    Past Surgical History:  Procedure Laterality Date  . AMPUTATION Right 10/14/2017   Procedure: AMPUTATION  GREAT TOE;  Surgeon: [name], MD;  Location: [facility];  Service: Orthopedics;  Laterality: Right;  . AMPUTATION Right 10/18/2017   Procedure: RIGHT BELOW KNEE AMPUTATION;  Surgeon: [name], MD;  Location: [facility];  Service: Orthopedics;  Laterality: Right;  . CYSTOSCOPY WITH INSERTION OF UROLIFT N/A 03/15/2018   Procedure: CYSTOSCOPY WITH INSERTION OF UROLIFT;  Surgeon: Franchot Gallo, MD;  [facility];  Service: Urology;  Laterality: N/A;  . SHOULDER SURGERY  01/05  . STUMP REVISION Right 12/15/2017   Procedure: REVISION RIGHT BELOW KNEE AMPUTATION;  Surgeon: [name], MD;  Location: [facility];  Service: Orthopedics;  Laterality: Right;   Social History   Occupational History  . Not on file  Tobacco Use  . Smoking status: Never Smoker  . Smokeless tobacco: Never Used  Substance and Sexual Activity  . Alcohol use: No    Alcohol/week: 0.0 standard drinks  . Drug use: Never  . Sexual activity: Yes

## 2018-04-25 DIAGNOSIS — R3914 Feeling of incomplete bladder emptying: Secondary | ICD-10-CM | POA: Diagnosis not present

## 2018-04-25 DIAGNOSIS — N401 Enlarged prostate with lower urinary tract symptoms: Secondary | ICD-10-CM | POA: Diagnosis not present

## 2018-05-07 DIAGNOSIS — H35033 Hypertensive retinopathy, bilateral: Secondary | ICD-10-CM | POA: Diagnosis not present

## 2018-05-07 DIAGNOSIS — H2513 Age-related nuclear cataract, bilateral: Secondary | ICD-10-CM | POA: Diagnosis not present

## 2018-05-07 DIAGNOSIS — E113313 Type 2 diabetes mellitus with moderate nonproliferative diabetic retinopathy with macular edema, bilateral: Secondary | ICD-10-CM | POA: Diagnosis not present

## 2018-05-14 ENCOUNTER — Ambulatory Visit (INDEPENDENT_AMBULATORY_CARE_PROVIDER_SITE_OTHER): Payer: PPO | Admitting: Orthopedic Surgery

## 2018-05-15 DIAGNOSIS — S88119A Complete traumatic amputation at level between knee and ankle, unspecified lower leg, initial encounter: Secondary | ICD-10-CM | POA: Diagnosis not present

## 2018-05-16 DIAGNOSIS — E1169 Type 2 diabetes mellitus with other specified complication: Secondary | ICD-10-CM | POA: Diagnosis not present

## 2018-05-16 DIAGNOSIS — Z89511 Acquired absence of right leg below knee: Secondary | ICD-10-CM | POA: Diagnosis not present

## 2018-05-18 DIAGNOSIS — Z89511 Acquired absence of right leg below knee: Secondary | ICD-10-CM | POA: Diagnosis not present

## 2018-05-21 DIAGNOSIS — H2511 Age-related nuclear cataract, right eye: Secondary | ICD-10-CM | POA: Diagnosis not present

## 2018-05-21 DIAGNOSIS — E113411 Type 2 diabetes mellitus with severe nonproliferative diabetic retinopathy with macular edema, right eye: Secondary | ICD-10-CM | POA: Diagnosis not present

## 2018-05-21 DIAGNOSIS — E113412 Type 2 diabetes mellitus with severe nonproliferative diabetic retinopathy with macular edema, left eye: Secondary | ICD-10-CM | POA: Diagnosis not present

## 2018-05-21 DIAGNOSIS — H2512 Age-related nuclear cataract, left eye: Secondary | ICD-10-CM | POA: Diagnosis not present

## 2018-05-22 ENCOUNTER — Ambulatory Visit (INDEPENDENT_AMBULATORY_CARE_PROVIDER_SITE_OTHER): Payer: PPO | Admitting: Orthopedic Surgery

## 2018-05-22 ENCOUNTER — Encounter (INDEPENDENT_AMBULATORY_CARE_PROVIDER_SITE_OTHER): Payer: Self-pay | Admitting: Orthopedic Surgery

## 2018-05-22 VITALS — Ht 72.0 in | Wt 223.0 lb

## 2018-05-22 DIAGNOSIS — Z89511 Acquired absence of right leg below knee: Secondary | ICD-10-CM

## 2018-05-22 DIAGNOSIS — E1142 Type 2 diabetes mellitus with diabetic polyneuropathy: Secondary | ICD-10-CM | POA: Diagnosis not present

## 2018-05-22 NOTE — Progress Notes (Signed)
Office Visit Note   Patient: Brian Herring           Date of Birth: 22-Apr-1956           MRN: [id number] Visit Date: 05/22/2018              Requested by: [name], [place], [place] [address] PCP: [name], MD  Chief Complaint  Patient presents with  . Right Leg - Routine Post Op    S/p right BKA      HPI: The patient is a 62 yo male who is seen for follow up following a right revision transtibial amputation. He is very pleased with his progress and obtained his prosthesis about 5 days ago from [facility]. He needs a referral to work with Physical therapy for gait training.   Assessment & Plan: Visit Diagnoses:  1. Status post below knee amputation, right ([place])   2. Type 2 diabetes mellitus with diabetic polyneuropathy, unspecified whether long term insulin use ([place])     Plan: Referral to [facility] for gait training with prosthesis. Continue to work with [facility] for prosthesis adjustment. Follow up prn for questions or concerns.   Follow-Up Instructions: No follow-ups on file.   Ortho Exam  Patient is alert, oriented, no adenopathy, well-dressed, normal affect, normal respiratory effort. Right transtibial amputation is well healed. No ulcers or signs of infection or cellulitis. Good knee flexion and full extension.   Imaging: No results found. No images are attached to the encounter.  Labs: Lab Results  Component Value Date   HGBA1C 7.0 (H) 03/12/2018   HGBA1C 6.9 (H) 12/15/2017   HGBA1C 8.7 (H) 10/12/2017   ESRSEDRATE 125 (H) 10/12/2017   CRP 32.4 (H) 10/12/2017   REPTSTATUS 10/25/2017 FINAL 10/24/2017   GRAMSTAIN  10/14/2017    RARE WBC PRESENT, PREDOMINANTLY PMN FEW RARE GRAM POSITIVE COCCI IN PAIRS    CULT  10/24/2017    NO GROWTH Performed at [facility], [place] [address], [place], [place] [address]      Lab Results  Component Value Date   ALBUMIN 3.5 01/08/2018   ALBUMIN 2.3 (L)  10/21/2017   ALBUMIN 3.4 (L) 10/12/2017   PREALBUMIN <5 (L) 10/12/2017    Body mass index is 30.24 kg/m.  Orders:  Orders Placed This Encounter  Procedures  . Ambulatory referral to Physical Therapy   No orders of the defined types were placed in this encounter.    Procedures: No procedures performed  Clinical Data: No additional findings.  ROS:  All other systems negative, except as noted in the HPI. Review of Systems  Objective: Vital Signs: Ht 6' (1.829 m)   Wt 223 lb (101.2 kg)   BMI 30.24 kg/m   Specialty Comments:  No specialty comments available.  PMFS History: Patient Active Problem List   Diagnosis Date Noted  . Dehiscence of amputation stump ([place])   . History of right below knee amputation ([place]) 11/08/2017  . Hypoalbuminemia due to protein-calorie malnutrition ([place])   . Urinary retention   . Acute blood loss anemia   . Status post below knee amputation, right ([place]) 10/20/2017  . Unilateral complete BKA ([place]) 10/20/2017  . Sepsis ([place]) 10/12/2017  . Diabetic foot ulcer ([place]) 10/12/2017  . Diabetic foot infection ([place]) 10/12/2017  . Hyponatremia 10/12/2017  . Chronic systolic CHF (congestive heart failure) ([place])   . Hyperkalemia 10/13/2015  . Congestive dilated cardiomyopathy ([place]) 07/13/2015  .  Asymptomatic LV dysfunction 06/25/2015  . Poorly controlled diabetes mellitus ([place]) 05/31/2015  . HTN (hypertension), benign 07/04/2014  . Viral cardiomyopathy, EF improved to 45-50% 04/02/2013  . Obesity 04/02/2013  . Diabetic foot ulcer with osteomyelitis ([place]) 04/02/2013  . Hyperlipidemia 04/02/2013  . Sinus tachycardia 04/02/2013   Past Medical History:  Diagnosis Date  . Arthritis    shoulder  . Cancer ([place])    skin cancer on nose  . Cardiomyopathy ([place])   . Chronic systolic CHF (congestive heart failure) ([place])   . Diabetes mellitus without complication ([place])   . Hyperlipidemia   . Hypertension     Family History  Problem Relation Age of  Onset  . Pneumonia Mother        aspiration    Past Surgical History:  Procedure Laterality Date  . AMPUTATION Right 10/14/2017   Procedure: AMPUTATION  GREAT TOE;  Surgeon: [name], MD;  Location: [facility];  Service: Orthopedics;  Laterality: Right;  . AMPUTATION Right 10/18/2017   Procedure: RIGHT BELOW KNEE AMPUTATION;  Surgeon: [name], MD;  Location: [facility];  Service: Orthopedics;  Laterality: Right;  . CYSTOSCOPY WITH INSERTION OF UROLIFT N/A 03/15/2018   Procedure: CYSTOSCOPY WITH INSERTION OF UROLIFT;  Surgeon: Franchot Gallo, MD;  Location: [facility];  Service: Urology;  Laterality: N/A;  . SHOULDER SURGERY  01/05  . STUMP REVISION Right 12/15/2017   Procedure: REVISION RIGHT BELOW KNEE AMPUTATION;  Surgeon: [name], MD;  Location: [name];  Service: Orthopedics;  Laterality: Right;   Social History   Occupational History  . Not on file  Tobacco Use  . Smoking status: Never Smoker  . Smokeless tobacco: Never Used  Substance and Sexual Activity  . Alcohol use: No    Alcohol/week: 0.0 standard drinks  . Drug use: Never  . Sexual activity: Yes

## 2018-05-24 DIAGNOSIS — E1140 Type 2 diabetes mellitus with diabetic neuropathy, unspecified: Secondary | ICD-10-CM | POA: Diagnosis not present

## 2018-05-24 DIAGNOSIS — I10 Essential (primary) hypertension: Secondary | ICD-10-CM | POA: Diagnosis not present

## 2018-05-24 DIAGNOSIS — R600 Localized edema: Secondary | ICD-10-CM | POA: Diagnosis not present

## 2018-05-24 DIAGNOSIS — I5043 Acute on chronic combined systolic (congestive) and diastolic (congestive) heart failure: Secondary | ICD-10-CM | POA: Diagnosis not present

## 2018-05-28 DIAGNOSIS — E113412 Type 2 diabetes mellitus with severe nonproliferative diabetic retinopathy with macular edema, left eye: Secondary | ICD-10-CM | POA: Diagnosis not present

## 2018-05-31 DIAGNOSIS — I429 Cardiomyopathy, unspecified: Secondary | ICD-10-CM | POA: Diagnosis not present

## 2018-06-05 ENCOUNTER — Other Ambulatory Visit: Payer: Self-pay

## 2018-06-05 ENCOUNTER — Encounter: Payer: Self-pay | Admitting: Physical Therapy

## 2018-06-05 ENCOUNTER — Ambulatory Visit: Payer: PPO | Attending: Physician Assistant | Admitting: Physical Therapy

## 2018-06-05 DIAGNOSIS — R293 Abnormal posture: Secondary | ICD-10-CM | POA: Insufficient documentation

## 2018-06-05 DIAGNOSIS — R2689 Other abnormalities of gait and mobility: Secondary | ICD-10-CM | POA: Insufficient documentation

## 2018-06-05 DIAGNOSIS — R2681 Unsteadiness on feet: Secondary | ICD-10-CM | POA: Diagnosis not present

## 2018-06-05 DIAGNOSIS — M6281 Muscle weakness (generalized): Secondary | ICD-10-CM | POA: Diagnosis not present

## 2018-06-06 DIAGNOSIS — L0390 Cellulitis, unspecified: Secondary | ICD-10-CM | POA: Diagnosis not present

## 2018-06-06 NOTE — Therapy (Signed)
[facility] [address] [place], [place], [address] Phone: [phone]   Fax:  [phone]  Physical Therapy Evaluation  Patient Details  Name: Brian Herring MRN: [id number] Date of Birth: Aug 31, 1955 Referring Provider (PT): Milas Gain, [place]   Encounter Date: 06/05/2018  PT End of Session - 06/05/18 1800    Visit Number  1    Number of Visits  25    Date for PT Re-Evaluation  09/03/18    Authorization Type  [employer]; $15 co-pay. $3400 oop & $160.49 applied pre-eval    PT Start Time  1100    PT Stop Time  1145    PT Time Calculation (min)  45 min    Equipment Utilized During Treatment  Gait belt    Activity Tolerance  Patient tolerated treatment well    Behavior During Therapy  [facility] for tasks assessed/performed       Past Medical History:  Diagnosis Date  . Arthritis    shoulder  . Cancer ([place])    skin cancer on nose  . Cardiomyopathy ([place])   . Chronic systolic CHF (congestive heart failure) ([place])   . Diabetes mellitus without complication ([place])   . Hyperlipidemia   . Hypertension     Past Surgical History:  Procedure Laterality Date  . AMPUTATION Right 10/14/2017   Procedure: AMPUTATION  GREAT TOE;  Surgeon: [name], MD;  Location: [facility];  Service: Orthopedics;  Laterality: Right;  . AMPUTATION Right 10/18/2017   Procedure: RIGHT BELOW KNEE AMPUTATION;  Surgeon: [name], MD;  Location: [facility];  Service: Orthopedics;  Laterality: Right;  . CYSTOSCOPY WITH INSERTION OF UROLIFT N/A 03/15/2018   Procedure: CYSTOSCOPY WITH INSERTION OF UROLIFT;  Surgeon: Franchot Gallo, MD;  Location[facility];  Service: Urology;  Laterality: N/A;  . SHOULDER SURGERY  01/05  . STUMP REVISION Right 12/15/2017   Procedure: REVISION RIGHT BELOW KNEE AMPUTATION;  Surgeon: [name], MD;  Location: [facility];  Service: Orthopedics;  Laterality: Right;    There were no vitals filed  for this visit.   Subjective Assessment - 06/05/18 1106    Subjective  This 62yo male was referred for Physical Therapy evaluation s/p BKA by Milas Gain, PA on 05/22/2018. He underwent a right Transtibial Amputation on 10/18/2017 with revision 12/15/2017. He received prosthesis 05/18/2018.     Pertinent History  R TTA, DM2, cardiomyopathy, tachycardia, CHF, HTN, obesity, HLD, arthritis,     Limitations  Lifting;Standing;House hold activities;Walking    Patient Stated Goals  Go back to work part-time doing something, walk with prosthesis, get back where I used to be    Currently in Pain?  No/denies        06/05/18 1100  Assessment  Medical Diagnosis Right Transtibial Amputation  Referring Provider (PT) Shawn Annamarie Dawley, PA  Onset Date/Surgical Date 05/18/18 (prosthesis delivery)  Hand Dominance Right  Prior Therapy inpatient rehab  Precautions  Precautions Fall  Restrictions  Weight Bearing Restrictions No  Balance Screen  Has the patient fallen in the past 6 months No  Has the patient had a decrease in activity level because of a fear of falling?  No  Is the patient reluctant to leave their home because of a fear of falling?  No  Home Environment  Living Environment Private residence  Living Arrangements Alone  Type of Dane entrance;Stairs to enter  Entrance Stairs-Number of Steps 2  Entrance Stairs-Rails None  Home Layout One level  Home Equipment Wheelchair - manual;Walker - 2 wheels;BSC;Tub bench;Grab bars - tub/shower  Prior Function  Level of Independence Independent;Independent with household mobility without device;Independent with community mobility without device  Vocation On disability  Leisure fishing,   Posture/Postural Control  Posture/Postural Control Postural limitations  Postural Limitations Rounded Shoulders;Forward head;Flexed trunk;Weight shift left  ROM / Strength  AROM / PROM / Strength AROM;Strength   AROM  Overall AROM  Within functional limits for tasks performed  Strength  Overall Strength Within functional limits for tasks performed  Transfers  Transfers Sit to Stand;Stand to Sit  Sit to Stand 5: Supervision;With upper extremity assist;With armrests;From chair/3-in-1  Stand to Sit 5: Supervision;With upper extremity assist;With armrests;To chair/3-in-1  Ambulation/Gait  Ambulation/Gait Yes  Ambulation/Gait Assistance 5: Supervision;3: Mod assist (supervision w/ RW & ModA/HHA cane)  Ambulation/Gait Assistance Details excessive UE weight bearing on RW and partial weight on prosthesis  Ambulation Distance (Feet) 75 Feet (75' RW & 46' cane/HHA)  Assistive device Prosthesis;Rolling walker;Straight cane;1 person hand held assist  Gait Pattern Step-to pattern;Decreased stance time - right;Decreased step length - left;Decreased stride length;Decreased hip/knee flexion - left;Decreased weight shift to left;Left hip hike;Left flexed knee in stance;Antalgic;Lateral hip instability;Trunk flexed;Abducted - left;Poor foot clearance - left  Ambulation Surface Level;Indoor  Gait velocity 0.86 ft/sec with RW & prosthesis  Gait Comments Pre-activity: SpO2 91% HR 102  after gait: SpO2 96% HR 109  Standardized Balance Assessment  Standardized Balance Assessment Berg Balance Test  Berg Balance Test  Sit to Stand 2  Standing Unsupported 3  Sitting with Back Unsupported but Feet Supported on Floor or Stool 4  Stand to Sit 3  Transfers 3  Standing Unsupported with Eyes Closed 1  Standing Ubsupported with Feet Together 1  From Standing, Reach Forward with Outstretched Arm 1  From Standing Position, Pick up Object from Floor 0  From Standing Position, Turn to Look Behind Over each Shoulder 1  Turn 360 Degrees 0  Standing Unsupported, Alternately Place Feet on Step/Stool 0  Standing Unsupported, One Foot in Front 1  Standing on One Leg 1  Total Score 21  Berg comment: Pre-activity: SpO2 91% HR  102  after Berg: SpO2 96% HR 106    [facility] PT Assessment - 06/06/18 0001      Standardized Balance Assessment   Standardized Balance Assessment  --      Prosthetics Assessment - 06/05/18 1100      Prosthetics   Prosthetic Care Dependent with  Skin check;Residual limb care;Care of non-amputated limb;Prosthetic cleaning;Correct ply sock adjustment;Ply sock cleaning;Proper wear schedule/adjustment;Proper weight-bearing schedule/adjustment    Donning prosthesis   Supervision    Doffing prosthesis   Supervision    Current prosthetic wear tolerance (days/week)   daily 18 of 18 days since delivery    Current prosthetic wear tolerance (#hours/day)   started with 2 hrs 2x/day and progressed to 4-5 hrs if around house & up to 9 hrs when going out.  Residual limb has pre-blister & heat rash signs from inconsistent wear & too rapid of increase.  PT recommended starting with 4-5hrs 2x/day with drying half way of each wear. Pt & wife verbalized understanding.     Current prosthetic weight-bearing tolerance (hours/day)   Pt tolerated 5 minutes of standing with partial weight on prosthesis with intermittent UE support with no limb pain or discomfort.    Edema  pitting edema    Residual limb condition   No  open areas, redness & shiny distal limb with preblister appearance with one 62mm circle of dry skin that could have been blister,  good hair growth proximal to incision,     K code/activity level with prosthetic use   K3 full community with variable cadence               Objective measurements completed on examination: See above findings.      [facility] Adult PT Treatment/Exercise - 06/05/18 1100      Prosthetics   Prosthetic Care Comments   use of antiperspirant in evenings, wear of shrinker when not wearing prosthesis, need to wear 2x/day & dry half way of 4-5 hr wear to help control sweat / moisture in socket    Education Provided  Skin check;Residual limb care;Prosthetic cleaning;Correct ply  sock adjustment;Proper Donning;Proper wear schedule/adjustment;Other (comment)   see prosthetic care comments   Person(s) Educated  Patient;Spouse    Education Method  Explanation;Demonstration;Tactile cues;Verbal cues    Education Method  Verbalized understanding;Returned demonstration;Tactile cues required;Verbal cues required;Needs further instruction             PT Education - 06/05/18 1140    Education Details  use of RW for all standing & gait with limited distances, POC    Person(s) Educated  Patient;Spouse    Methods  Explanation;Verbal cues    Comprehension  Verbalized understanding       PT Short Term Goals - 06/05/18 1800      PT SHORT TERM GOAL #1   Title  Patient verbalizes proper cleaning & demonstrates proper donning of prosthesis. (All STGs Target Date: 07/05/2018)    Time  1    Period  Months    Status  New    Target Date  07/05/18      PT SHORT TERM GOAL #2   Title  Patient tolerates prosthesis wear >12 hrs total / day without skin issues.     Time  1    Period  Months    Status  New    Target Date  07/05/18      PT SHORT TERM GOAL #3   Title  Patient ambulates 500' with RW & negotiates ramps / curbs with prosthesis modified independent.     Time  1    Period  Months    Status  New    Target Date  07/05/18      PT SHORT TERM GOAL #4   Title  Patient ambulates 200' with cane & prosthesis with minA.     Time  1    Period  Months    Status  New    Target Date  07/05/18      PT SHORT TERM GOAL #5   Title  Patient able to pick up object from floor with supervision.     Time  1    Period  Months    Status  New    Target Date  07/05/18        PT Long Term Goals - 06/05/18 1800      PT LONG TERM GOAL #1   Title  Patient verbalizes & demonstrates proper prosthetic care to enable safe use of prosthesis. (All LTGs Target Date: 08/31/2018)    Time  3    Period  Months    Status  New    Target Date  08/31/18      PT LONG TERM GOAL #2   Title   Patient tolerates prosthesis wear >90%  of awake hours without skin issues or limb pain to enable function throughout his day.     Time  3    Period  Months    Status  New    Target Date  08/31/18      PT LONG TERM GOAL #3   Title  Berg Balance >45/56 to indicate low fall risk.     Time  3    Period  Months    Status  New    Target Date  08/31/18      PT LONG TERM GOAL #4   Title  Patient ambulates 500' outdoors including grass, ramps & curbs with cane or less & prosthesis modified independent for community mobility.     Time  3    Period  Months    Status  New    Target Date  08/31/18      PT LONG TERM GOAL #5   Title  Patient able to perform lifting 25# boxes, carrying, pushing, pulling and climbing A-frame ladder to enable patient goals to return to yard work.     Time  3    Period  Months    Status  New    Target Date  08/31/18             Plan - 06/05/18 1800    Clinical Impression Statement  This 62yo male underwent a right Transtibial Amputation on 10/18/2017 with revision 12/15/2017 and received his first prosthesis on 05/18/2018. He has generalized weakness with deconditioning from prolonged w/c bound limited activity. His posture is abnormal with weight shift from prosthesis and flexed posture. He has worn prosthesis for 18 of 18 days since delivery progressing time up to 9 hrs but inconsistent & too rapid leading to skin changes consistent with pre-blister & heat rash. He is dependent in proper prosthesis care & use. His balance is impaired with Merrilee Jansky Balance 21/56 indicating high fall risk & dependency in standing ADLs. His prosthetic gait has deviations including excessive upper extremity weight bearing and velocity 0.86 ft/sec indicate high fall risk. Patient would benefit from skilled PT to progress wear & use of prosthesis and safe function.     History and Personal Factors relevant to plan of care:  R TTA, DM2, cardiomyopathy, tachycardia, CHF, HTN, obesity, HLD,  arthritis    Clinical Presentation  Evolving    Clinical Presentation due to:  high fall risk, prosthetic dependency, skin changes, DM2, cardiac issues    Clinical Decision Making  Moderate    Rehab Potential  Good    PT Frequency  2x / week    PT Duration  12 weeks    PT Treatment/Interventions  ADLs/Self Care Home Management;Canalith Repostioning;DME Instruction;Gait training;Stair training;Functional mobility training;Therapeutic activities;Therapeutic exercise;Balance training;Neuromuscular re-education;Patient/family education;Prosthetic Training;Manual techniques;Vestibular    PT Next Visit Plan  review prosthetic care, prosthetic gait with RW including ramps, curbs & stairs 2 rails. HEP at sink for midline    Consulted and Agree with Plan of Care  Patient;Family member/caregiver    Family Member Consulted  significant other, Jed Limerick       Patient will benefit from skilled therapeutic intervention in order to improve the following deficits and impairments:  Abnormal gait, Decreased activity tolerance, Decreased balance, Decreased endurance, Decreased knowledge of use of DME, Decreased mobility, Decreased strength, Decreased skin integrity, Dizziness, Impaired flexibility, Postural dysfunction, Prosthetic Dependency  Visit Diagnosis: Abnormal posture  Muscle weakness (generalized)  Unsteadiness on feet  Other abnormalities of gait and mobility  Problem List Patient Active Problem List   Diagnosis Date Noted  . Dehiscence of amputation stump ([place])   . History of right below knee amputation ([place]) 11/08/2017  . Hypoalbuminemia due to protein-calorie malnutrition ([place])   . Urinary retention   . Acute blood loss anemia   . Status post below knee amputation, right ([place]) 10/20/2017  . Unilateral complete BKA ([place]) 10/20/2017  . Sepsis ([place]) 10/12/2017  . Diabetic foot ulcer ([place]) 10/12/2017  . Diabetic foot infection ([place]) 10/12/2017  . Hyponatremia 10/12/2017  . Chronic  systolic CHF (congestive heart failure) ([place])   . Hyperkalemia 10/13/2015  . Congestive dilated cardiomyopathy ([place]) 07/13/2015  . Asymptomatic LV dysfunction 06/25/2015  . Poorly controlled diabetes mellitus ([place]) 05/31/2015  . HTN (hypertension), benign 07/04/2014  . Viral cardiomyopathy, EF improved to 45-50% 04/02/2013  . Obesity 04/02/2013  . Diabetic foot ulcer with osteomyelitis ([place]) 04/02/2013  . Hyperlipidemia 04/02/2013  . Sinus tachycardia 04/02/2013    Kissy Cielo PT, DPT 06/06/2018, 7:09 AM  [facility] [address] [place], [place], [address] Phone: [phone]   Fax:  [phone]  Name: Brian Herring MRN: [id number] Date of Birth: 10/16/1955

## 2018-06-07 DIAGNOSIS — I10 Essential (primary) hypertension: Secondary | ICD-10-CM | POA: Diagnosis not present

## 2018-06-07 DIAGNOSIS — I5043 Acute on chronic combined systolic (congestive) and diastolic (congestive) heart failure: Secondary | ICD-10-CM | POA: Diagnosis not present

## 2018-06-07 DIAGNOSIS — R600 Localized edema: Secondary | ICD-10-CM | POA: Diagnosis not present

## 2018-06-07 DIAGNOSIS — E1140 Type 2 diabetes mellitus with diabetic neuropathy, unspecified: Secondary | ICD-10-CM | POA: Diagnosis not present

## 2018-06-13 ENCOUNTER — Encounter: Payer: Self-pay | Admitting: Physical Therapy

## 2018-06-13 ENCOUNTER — Ambulatory Visit: Payer: PPO | Admitting: Physical Therapy

## 2018-06-13 DIAGNOSIS — R293 Abnormal posture: Secondary | ICD-10-CM | POA: Diagnosis not present

## 2018-06-13 DIAGNOSIS — M6281 Muscle weakness (generalized): Secondary | ICD-10-CM

## 2018-06-13 DIAGNOSIS — R2681 Unsteadiness on feet: Secondary | ICD-10-CM

## 2018-06-13 DIAGNOSIS — R2689 Other abnormalities of gait and mobility: Secondary | ICD-10-CM

## 2018-06-14 NOTE — Therapy (Signed)
[facility] [address] [place], [place], [address] Phone: [phone]   Fax:  [phone]  Physical Therapy Treatment  Patient Details  Name: Brian Herring MRN: [id number] Date of Birth: 1955-12-15 Referring Provider (PT): Milas Gain, [place]   Encounter Date: 06/13/2018  PT End of Session - 06/13/18 1332    Visit Number  2    Number of Visits  25    Date for PT Re-Evaluation  09/03/18    Authorization Type  [employer]; $15 co-pay. $3400 oop & $160.49 applied pre-eval    PT Start Time  0930    PT Stop Time  1015    PT Time Calculation (min)  45 min    Equipment Utilized During Treatment  Gait belt    Activity Tolerance  Patient tolerated treatment well    Behavior During Therapy  [facility] for tasks assessed/performed       Past Medical History:  Diagnosis Date  . Arthritis    shoulder  . Cancer ([place])    skin cancer on nose  . Cardiomyopathy ([place])   . Chronic systolic CHF (congestive heart failure) ([place])   . Diabetes mellitus without complication ([place])   . Hyperlipidemia   . Hypertension     Past Surgical History:  Procedure Laterality Date  . AMPUTATION Right 10/14/2017   Procedure: AMPUTATION  GREAT TOE;  Surgeon: [name], MD;  Location: [facility];  Service: Orthopedics;  Laterality: Right;  . AMPUTATION Right 10/18/2017   Procedure: RIGHT BELOW KNEE AMPUTATION;  Surgeon: [name], MD;  Location: [facility];  Service: Orthopedics;  Laterality: Right;  . CYSTOSCOPY WITH INSERTION OF UROLIFT N/A 03/15/2018   Procedure: CYSTOSCOPY WITH INSERTION OF UROLIFT;  Surgeon: Franchot Gallo, MD;  [facility];  Service: Urology;  Laterality: N/A;  . SHOULDER SURGERY  01/05  . STUMP REVISION Right 12/15/2017   Procedure: REVISION RIGHT BELOW KNEE AMPUTATION;  Surgeon: [name], MD;  Location: [facility];  Service: Orthopedics;  Laterality: Right;    There were no vitals filed  for this visit.  Subjective Assessment - 06/13/18 0928    Subjective  No falls. He has been wearing prosthesis 5-7 hrs 1 or 2 times per day.     Pertinent History  R TTA, DM2, cardiomyopathy, tachycardia, CHF, HTN, obesity, HLD, arthritis,     Limitations  Lifting;Standing;House hold activities;Walking    Patient Stated Goals  Go back to work part-time doing something, walk with prosthesis, get back where I used to be    Currently in Pain?  No/denies                       [facility] Adult PT Treatment/Exercise - 06/13/18 0930      Transfers   Transfers  Sit to Stand;Stand to Sit    Sit to Stand  5: Supervision;With upper extremity assist;From chair/3-in-1   to RW   Sit to Stand Details (indicate cue type and reason)  demo & verbal cues on technique for chairs without armrest    Stand to Sit  5: Supervision;With upper extremity assist;To chair/3-in-1    Stand to Sit Details  demo & verbal cues on technique for chairs without armrest      Ambulation/Gait   Ambulation/Gait  Yes    Ambulation/Gait Assistance  5: Supervision    Ambulation/Gait Assistance Details  verbal & demo cues on proper step width (not abduction)  Ambulation Distance (Feet)  150 Feet    Assistive device  Rolling walker;Prosthesis    Ambulation Surface  Indoor;Level    Stairs  Yes    Stairs Assistance  5: Supervision    Stairs Assistance Details (indicate cue type and reason)  PT demo, instructed in modified technique with TTA prosthesis    Stair Management Technique  Two rails;Step to pattern;Forwards    Number of Stairs  4    Ramp  5: Supervision   RW & prosthesis   Ramp Details (indicate cue type and reason)  PT demo, instructed in modified technique with TTA prosthesis    Curb  5: Supervision    Curb Details (indicate cue type and reason)  PT demo, instructed in modified technique with TTA prosthesis      Neuro Re-ed    Neuro Re-ed Details   standing with intermittent UE support: picking up  objects from floor & looking over shoulders. PT demo proper technique and pt return demo understanding with tactile cues      Prosthetics   Prosthetic Care Comments   PT instructed in signs of sweat & drying limb/liner. Need to wear 2x/day & dry half way of wear to control sweat.     Current prosthetic wear tolerance (days/week)   daily    Current prosthetic wear tolerance (#hours/day)   wearing 5-7hrs, PT recommended 4-5 hrs 2x/day drying half way    Edema  pitting edema    Residual limb condition   red rash with small white areas in center. Appears to be heat rash with some opening a small amount    Education Provided  Skin check;Residual limb care;Prosthetic cleaning;Correct ply sock adjustment;Proper Donning;Proper wear schedule/adjustment;Other (comment)   see prosthetic care comments   Person(s) Educated  Patient    Education Method  Explanation;Demonstration;Tactile cues;Verbal cues    Education Method  Verbalized understanding;Returned demonstration;Tactile cues required;Verbal cues required;Needs further instruction    Donning Prosthesis  Supervision               PT Short Term Goals - 06/13/18 1333      PT SHORT TERM GOAL #1   Title  Patient verbalizes proper cleaning & demonstrates proper donning of prosthesis. (All STGs Target Date: 07/05/2018)    Time  1    Period  Months    Status  On-going    Target Date  07/05/18      PT SHORT TERM GOAL #2   Title  Patient tolerates prosthesis wear >12 hrs total / day without skin issues.     Time  1    Period  Months    Status  On-going    Target Date  07/05/18      PT SHORT TERM GOAL #3   Title  Patient ambulates 500' with RW & negotiates ramps / curbs with prosthesis modified independent.     Time  1    Period  Months    Status  On-going    Target Date  07/05/18      PT SHORT TERM GOAL #4   Title  Patient ambulates 200' with cane & prosthesis with minA.     Time  1    Period  Months    Status  On-going    Target  Date  07/05/18      PT SHORT TERM GOAL #5   Title  Patient able to pick up object from floor with supervision.     Time  1  Period  Months    Status  On-going    Target Date  07/05/18        PT Long Term Goals - 06/13/18 1334      PT LONG TERM GOAL #1   Title  Patient verbalizes & demonstrates proper prosthetic care to enable safe use of prosthesis. (All LTGs Target Date: 08/31/2018)    Time  3    Period  Months    Status  On-going    Target Date  08/31/18      PT LONG TERM GOAL #2   Title  Patient tolerates prosthesis wear >90% of awake hours without skin issues or limb pain to enable function throughout his day.     Time  3    Period  Months    Status  On-going    Target Date  08/31/18      PT LONG TERM GOAL #3   Title  Berg Balance >45/56 to indicate low fall risk.     Time  3    Period  Months    Status  On-going    Target Date  08/31/18      PT LONG TERM GOAL #4   Title  Patient ambulates 500' outdoors including grass, ramps & curbs with cane or less & prosthesis modified independent for community mobility.     Time  3    Period  Months    Status  On-going    Target Date  08/31/18      PT LONG TERM GOAL #5   Title  Patient able to perform lifting 25# boxes, carrying, pushing, pulling and climbing A-frame ladder to enable patient goals to return to yard work.     Time  3    Period  Months    Status  On-going    Target Date  08/31/18            Plan - 06/13/18 1335    Clinical Impression Statement  Patient improved understanding for prosthetic gait with RW including ramps & curbs. Session also focused on prosthetic care issues and standing balance.     Rehab Potential  Good    PT Frequency  2x / week    PT Duration  12 weeks    PT Treatment/Interventions  ADLs/Self Care Home Management;Canalith Repostioning;DME Instruction;Gait training;Stair training;Functional mobility training;Therapeutic activities;Therapeutic exercise;Balance training;Neuromuscular  re-education;Patient/family education;Prosthetic Training;Manual techniques;Vestibular    PT Next Visit Plan  review prosthetic care and prosthetic gait with RW including ramps, curbs & stairs 2 rails. HEP at sink for midline    Consulted and Agree with Plan of Care  Patient;Family member/caregiver    Family Member Consulted  significant other, Jed Limerick       Patient will benefit from skilled therapeutic intervention in order to improve the following deficits and impairments:  Abnormal gait, Decreased activity tolerance, Decreased balance, Decreased endurance, Decreased knowledge of use of DME, Decreased mobility, Decreased strength, Decreased skin integrity, Dizziness, Impaired flexibility, Postural dysfunction, Prosthetic Dependency  Visit Diagnosis: Abnormal posture  Muscle weakness (generalized)  Unsteadiness on feet  Other abnormalities of gait and mobility     Problem List Patient Active Problem List   Diagnosis Date Noted  . Dehiscence of amputation stump ([place])   . History of right below knee amputation ([place]) 11/08/2017  . Hypoalbuminemia due to protein-calorie malnutrition ([place])   . Urinary retention   . Acute blood loss anemia   . Status post below knee amputation, right ([place]) 10/20/2017  .  Unilateral complete BKA ([place]) 10/20/2017  . Sepsis ([place]) 10/12/2017  . Diabetic foot ulcer ([place]) 10/12/2017  . Diabetic foot infection ([place]) 10/12/2017  . Hyponatremia 10/12/2017  . Chronic systolic CHF (congestive heart failure) ([place])   . Hyperkalemia 10/13/2015  . Congestive dilated cardiomyopathy ([place]) 07/13/2015  . Asymptomatic LV dysfunction 06/25/2015  . Poorly controlled diabetes mellitus ([place]) 05/31/2015  . HTN (hypertension), benign 07/04/2014  . Viral cardiomyopathy, EF improved to 45-50% 04/02/2013  . Obesity 04/02/2013  . Diabetic foot ulcer with osteomyelitis ([place]) 04/02/2013  . Hyperlipidemia 04/02/2013  . Sinus tachycardia 04/02/2013    Kelina Beauchamp PT,  DPT 06/14/2018, 5:14 AM  [facility] [address] [place], [place], [address] Phone: [phone]   Fax:  [phone]  Name: DRAYLON MERCADEL MRN: [id number] Date of Birth: 03-Oct-1955

## 2018-06-15 ENCOUNTER — Encounter: Payer: Self-pay | Admitting: Physical Therapy

## 2018-06-15 ENCOUNTER — Ambulatory Visit: Payer: PPO | Admitting: Physical Therapy

## 2018-06-15 DIAGNOSIS — S88119A Complete traumatic amputation at level between knee and ankle, unspecified lower leg, initial encounter: Secondary | ICD-10-CM | POA: Diagnosis not present

## 2018-06-15 DIAGNOSIS — M6281 Muscle weakness (generalized): Secondary | ICD-10-CM

## 2018-06-15 DIAGNOSIS — R293 Abnormal posture: Secondary | ICD-10-CM | POA: Diagnosis not present

## 2018-06-15 DIAGNOSIS — R2689 Other abnormalities of gait and mobility: Secondary | ICD-10-CM

## 2018-06-15 DIAGNOSIS — R2681 Unsteadiness on feet: Secondary | ICD-10-CM

## 2018-06-15 NOTE — Patient Instructions (Signed)
Do each exercise 2-3 times per day Do each exercise 10 repetitions Hold each exercise for 3-5 seconds to feel your location  AT [place].  USE TAPE ON FLOOR TO MARK THE MIDLINE POSITION. You also should try to feel with your limb pressure in socket.  You are trying to feel with limb what you used to feel with the bottom of your foot.  1. Side to Side Shift: Moving your hips only (not shoulders): move weight onto your left leg, HOLD/FEEL.  Move back to equal weight on each leg, HOLD/FEEL. Move weight onto your right leg, HOLD/FEEL. Move back to equal weight on each leg, HOLD/FEEL. Repeat. 2. Front to Back Shift: Moving your hips only (not shoulders): move your weight forward onto your toes, HOLD/FEEL. Move your weight back to equal Flat Foot on both legs, HOLD/FEEL. Move your weight back onto your heels, HOLD/FEEL. Move your weight back to equal on both legs, HOLD/FEEL. Repeat. 3. Moving Cones / Cups: With equal weight on each leg: Hold on with one hand the first time, then progress to no hand supports. Move cups from one side of sink to the other. Place cups ~2" out of your reach, progress to 10" beyond reach. 4. Overhead/Upward Reaching: alternated reaching up to top cabinets or ceiling if no cabinets present. Keep equal weight on each leg. Start with one hand support on counter while other hand reaches and progress to no hand support with reaching. 5.   Looking Over Shoulders: With equal weight on each leg: alternate turning to look          over your shoulders with one hand support on counter as needed. Shift weight to             side looking, pull hip then shoulder then head/eyes around to look behind you. Start       with one hand support & progress to no hand support.

## 2018-06-15 NOTE — Therapy (Signed)
[facility] [address] [place], [place], [address] Phone: [phone]   Fax:  [phone]  Physical Therapy Treatment  Patient Details  Name: Brian Herring MRN: [id number] Date of Birth: 1955/09/15 Referring Provider (PT): Milas Gain, [place]   Encounter Date: 06/15/2018  PT End of Session - 06/15/18 1257    Visit Number  3    Number of Visits  25    Date for PT Re-Evaluation  09/03/18    Authorization Type  [employer] Medicare; $15 co-pay. $3400 oop & $160.49 applied pre-eval    PT Start Time  1017    PT Stop Time  1100    PT Time Calculation (min)  43 min    Equipment Utilized During Treatment  Gait belt    Activity Tolerance  Patient tolerated treatment well    Behavior During Therapy  W[facility] for tasks assessed/performed       Past Medical History:  Diagnosis Date  . Arthritis    shoulder  . Cancer ([place])    skin cancer on nose  . Cardiomyopathy ([place])   . Chronic systolic CHF (congestive heart failure) ([place])   . Diabetes mellitus without complication ([place])   . Hyperlipidemia   . Hypertension     Past Surgical History:  Procedure Laterality Date  . AMPUTATION Right 10/14/2017   Procedure: AMPUTATION  GREAT TOE;  Surgeon: [name], MD;  Location: [facility];  Service: Orthopedics;  Laterality: Right;  . AMPUTATION Right 10/18/2017   Procedure: RIGHT BELOW KNEE AMPUTATION;  Surgeon: [name], MD;  Location: [facility];  Service: Orthopedics;  Laterality: Right;  . CYSTOSCOPY WITH INSERTION OF UROLIFT N/A 03/15/2018   Procedure: CYSTOSCOPY WITH INSERTION OF UROLIFT;  Surgeon: Franchot Gallo, MD;  [facility];  Service: Urology;  Laterality: N/A;  . SHOULDER SURGERY  01/05  . STUMP REVISION Right 12/15/2017   Procedure: REVISION RIGHT BELOW KNEE AMPUTATION;  Surgeon: [name], MD;  Location: [facility];  Service: Orthopedics;  Laterality: Right;    There were no vitals filed  for this visit.  Subjective Assessment - 06/15/18 1024    Subjective  No falls. He has been wearing prosthesis 5 hrs in morning 5hrs in the afternoon.     Pertinent History  R TTA, DM2, cardiomyopathy, tachycardia, CHF, HTN, obesity, HLD, arthritis,     Limitations  Lifting;Standing;House hold activities;Walking    Patient Stated Goals  Go back to work part-time doing something, walk with prosthesis, get back where I used to be    Currently in Pain?  No/denies           [facility] Adult PT Treatment/Exercise - 06/15/18 1025      Transfers   Transfers  Sit to Stand;Stand to Sit    Sit to Stand  5: Supervision;With upper extremity assist;From chair/3-in-1    Stand to Sit  5: Supervision;With upper extremity assist;To chair/3-in-1      Ambulation/Gait   Ambulation/Gait  Yes    Ambulation/Gait Assistance  5: Supervision    Ambulation/Gait Assistance Details  verbal and tactile cues to hips to increase weight shift to prosthesis. Heavy UE reliance on RW.     Ambulation Distance (Feet)  345 Feet    Assistive device  Rolling walker;Prosthesis    Gait Pattern  Step-to pattern;Decreased stance time - right;Decreased step length - left;Decreased stride length;Lateral hip instability;Trunk flexed;Decreased weight shift to right;Decreased hip/knee flexion - right;Left flexed knee in stance;Antalgic  Ambulation Surface  Level;Indoor    Ramp  5: Supervision    Ramp Details (indicate cue type and reason)  Verbal cues for proper technique with prosthesis.       Neuro Re-ed    Neuro Re-ed Details   Sink HEP. See pt instuctions. pt required verbal and tactile cues for increased weight shift onto prosthesis.       Prosthetics   Prosthetic Care Comments   drying every 2.5 hrs, use baby oil/mineral oil to decrease friction rub from patella up on thigh. Use of antipersperant to decrease heat rash on lower limb.    Current prosthetic wear tolerance (days/week)   daily     Current prosthetic wear  tolerance (#hours/day)   wearing 5hr/day 2x /day     Residual limb condition   red rash with small white areas in center with some opening a very small amount   Education Provided  Skin check;Residual limb care            PT Short Term Goals - 06/13/18 1333      PT SHORT TERM GOAL #1   Title  Patient verbalizes proper cleaning & demonstrates proper donning of prosthesis. (All STGs Target Date: 07/05/2018)    Time  1    Period  Months    Status  On-going    Target Date  07/05/18      PT SHORT TERM GOAL #2   Title  Patient tolerates prosthesis wear >12 hrs total / day without skin issues.     Time  1    Period  Months    Status  On-going    Target Date  07/05/18      PT SHORT TERM GOAL #3   Title  Patient ambulates 500' with RW & negotiates ramps / curbs with prosthesis modified independent.     Time  1    Period  Months    Status  On-going    Target Date  07/05/18      PT SHORT TERM GOAL #4   Title  Patient ambulates 200' with cane & prosthesis with minA.     Time  1    Period  Months    Status  On-going    Target Date  07/05/18      PT SHORT TERM GOAL #5   Title  Patient able to pick up object from floor with supervision.     Time  1    Period  Months    Status  On-going    Target Date  07/05/18        PT Long Term Goals - 06/13/18 1334      PT LONG TERM GOAL #1   Title  Patient verbalizes & demonstrates proper prosthetic care to enable safe use of prosthesis. (All LTGs Target Date: 08/31/2018)    Time  3    Period  Months    Status  On-going    Target Date  08/31/18      PT LONG TERM GOAL #2   Title  Patient tolerates prosthesis wear >90% of awake hours without skin issues or limb pain to enable function throughout his day.     Time  3    Period  Months    Status  On-going    Target Date  08/31/18      PT LONG TERM GOAL #3   Title  Berg Balance >45/56 to indicate low fall risk.     Time  3  Period  Months    Status  On-going    Target Date   08/31/18      PT LONG TERM GOAL #4   Title  Patient ambulates 500' outdoors including grass, ramps & curbs with cane or less & prosthesis modified independent for community mobility.     Time  3    Period  Months    Status  On-going    Target Date  08/31/18      PT LONG TERM GOAL #5   Title  Patient able to perform lifting 25# boxes, carrying, pushing, pulling and climbing A-frame ladder to enable patient goals to return to yard work.     Time  3    Period  Months    Status  On-going    Target Date  08/31/18            Plan - 06/15/18 1259    Clinical Impression Statement  Today's session focused on initiating sink HEP and gait with RW including ramps. Pt required verbal and tactile cues to hips to increase weight shift to prosthesis. Pt is motivated and ready to increase activity. Pt would benefit from further PT in order to progress toward established goals and improve functional independence with prosthesis.     Rehab Potential  Good    PT Frequency  2x / week    PT Duration  12 weeks    PT Treatment/Interventions  ADLs/Self Care Home Management;Canalith Repostioning;DME Instruction;Gait training;Stair training;Functional mobility training;Therapeutic activities;Therapeutic exercise;Balance training;Neuromuscular re-education;Patient/family education;Prosthetic Training;Manual techniques;Vestibular    PT Next Visit Plan  review prosthetic care and prosthetic gait with RW including ramps, curbs & stairs 2 rails.    Consulted and Agree with Plan of Care  Patient;Family member/caregiver    Family Member Consulted  significant other, Jed Limerick       Patient will benefit from skilled therapeutic intervention in order to improve the following deficits and impairments:  Abnormal gait, Decreased activity tolerance, Decreased balance, Decreased endurance, Decreased knowledge of use of DME, Decreased mobility, Decreased strength, Decreased skin integrity, Dizziness, Impaired flexibility,  Postural dysfunction, Prosthetic Dependency  Visit Diagnosis: Other abnormalities of gait and mobility  Muscle weakness (generalized)  Unsteadiness on feet     Problem List Patient Active Problem List   Diagnosis Date Noted  . Dehiscence of amputation stump ([place])   . History of right below knee amputation ([place]) 11/08/2017  . Hypoalbuminemia due to protein-calorie malnutrition ([place])   . Urinary retention   . Acute blood loss anemia   . Status post below knee amputation, right ([place]) 10/20/2017  . Unilateral complete BKA ([place]) 10/20/2017  . Sepsis ([place]) 10/12/2017  . Diabetic foot ulcer ([place]) 10/12/2017  . Diabetic foot infection ([place]) 10/12/2017  . Hyponatremia 10/12/2017  . Chronic systolic CHF (congestive heart failure) ([place])   . Hyperkalemia 10/13/2015  . Congestive dilated cardiomyopathy ([place]) 07/13/2015  . Asymptomatic LV dysfunction 06/25/2015  . Poorly controlled diabetes mellitus ([place]) 05/31/2015  . HTN (hypertension), benign 07/04/2014  . Viral cardiomyopathy, EF improved to 45-50% 04/02/2013  . Obesity 04/02/2013  . Diabetic foot ulcer with osteomyelitis ([place]) 04/02/2013  . Hyperlipidemia 04/02/2013  . Sinus tachycardia 04/02/2013    Cecile Sheerer, SPTA 06/15/2018, 1:02 PM  [facility] [address] [place], [place], [address] Phone: [phone]   Fax:  [phone]  Name: WILLIAMS DIETRICK MRN: [id number] Date of Birth: Nov 01, 1955

## 2018-06-19 ENCOUNTER — Ambulatory Visit: Payer: PPO

## 2018-06-19 ENCOUNTER — Other Ambulatory Visit: Payer: Self-pay | Admitting: Pharmacist

## 2018-06-19 DIAGNOSIS — I10 Essential (primary) hypertension: Secondary | ICD-10-CM | POA: Diagnosis not present

## 2018-06-19 DIAGNOSIS — R2689 Other abnormalities of gait and mobility: Secondary | ICD-10-CM

## 2018-06-19 DIAGNOSIS — M6281 Muscle weakness (generalized): Secondary | ICD-10-CM

## 2018-06-19 DIAGNOSIS — R293 Abnormal posture: Secondary | ICD-10-CM | POA: Diagnosis not present

## 2018-06-19 DIAGNOSIS — R2681 Unsteadiness on feet: Secondary | ICD-10-CM

## 2018-06-19 NOTE — Therapy (Signed)
[facility] [address] [place], [place], [address] Phone: [phone]   Fax:  [phone]  Physical Therapy Treatment  Patient Details  Name: Brian Herring MRN: [id number] Date of Birth: [birth year] Referring Provider (PT): Milas Gain, [place]   Encounter Date: 06/19/2018  PT End of Session - 06/19/18 1150    Visit Number  4    Number of Visits  25    Date for PT Re-Evaluation  09/03/18    Authorization Type  [employer]; $15 co-pay. $3400 oop & $160.49 applied pre-eval    PT Start Time  1147    PT Stop Time  1232    PT Time Calculation (min)  45 min    Equipment Utilized During Treatment  Gait belt    Activity Tolerance  Patient tolerated treatment well    Behavior During Therapy  [facility] for tasks assessed/performed       Past Medical History:  Diagnosis Date  . Arthritis    shoulder  . Cancer ([place])    skin cancer on nose  . Cardiomyopathy ([place])   . Chronic systolic CHF (congestive heart failure) ([place])   . Diabetes mellitus without complication ([place])   . Hyperlipidemia   . Hypertension     Past Surgical History:  Procedure Laterality Date  . AMPUTATION Right 10/14/2017   Procedure: AMPUTATION  GREAT TOE;  Surgeon: [name], MD;  Location: [facility];  Service: Orthopedics;  Laterality: Right;  . AMPUTATION Right 10/18/2017   Procedure: RIGHT BELOW KNEE AMPUTATION;  Surgeon: [name], MD;  Location: [facility];  Service: Orthopedics;  Laterality: Right;  . CYSTOSCOPY WITH INSERTION OF UROLIFT N/A 03/15/2018   Procedure: CYSTOSCOPY WITH INSERTION OF UROLIFT;  Surgeon: Franchot Gallo, MD;  Location: [facility];  Service: Urology;  Laterality: N/A;  . SHOULDER SURGERY  01/05  . STUMP REVISION Right 12/15/2017   Procedure: REVISION RIGHT BELOW KNEE AMPUTATION;  Surgeon: [name], MD;  Location: [facility];  Service: Orthopedics;  Laterality: Right;    There were no vitals filed  for this visit.  Subjective Assessment - 06/19/18 1150    Subjective  No falls to report, HEP is going well.     Pertinent History  R TTA, DM2, cardiomyopathy, tachycardia, CHF, HTN, obesity, HLD, arthritis,     Limitations  Lifting;Standing;House hold activities;Walking    Patient Stated Goals  Go back to work part-time doing something, walk with prosthesis, get back where I used to be    Currently in Pain?  No/denies        [facility] Adult PT Treatment/Exercise - 06/19/18 1151      Ambulation/Gait   Ambulation/Gait  Yes    Ambulation/Gait Assistance  5: Supervision    Ambulation/Gait Assistance Details  Introduction to rollator walker negotiating indoors/outdoors with cues for initial technique, sequence and proper use of AD.    Ambulation Distance (Feet)  500 Feet   x2   Assistive device  Prosthesis;Rollator    Gait Pattern  Step-to pattern;Decreased stance time - right;Decreased step length - left;Decreased stride length;Lateral hip instability;Trunk flexed;Decreased weight shift to right;Decreased hip/knee flexion - right;Left flexed knee in stance;Antalgic    Ambulation Surface  Level;Indoor    Ramp  5: Supervision;Other (comment)   min guard   Ramp Details (indicate cue type and reason)  Indoor/outdoor ramp with VC's for proper technique with AD, pacing and weightshift.     Curb  5: Supervision  Curb Details (indicate cue type and reason)  Demo with Rollator indoor/outdoor surfaces with VC's for proper technique with AD/sequencing and placement.              PT Education - 06/19/18 1308    Education Details  Education/demo on use and benefits of a Rollator walker.    Person(s) Educated  Patient    Methods  Explanation;Demonstration;Tactile cues;Verbal cues    Comprehension  Verbalized understanding;Returned demonstration;Verbal cues required;Tactile cues required;Need further instruction       PT Short Term Goals - 06/13/18 1333      PT SHORT TERM GOAL #1   Title   Patient verbalizes proper cleaning & demonstrates proper donning of prosthesis. (All STGs Target Date: 07/05/2018)    Time  1    Period  Months    Status  On-going    Target Date  07/05/18      PT SHORT TERM GOAL #2   Title  Patient tolerates prosthesis wear >12 hrs total / day without skin issues.     Time  1    Period  Months    Status  On-going    Target Date  07/05/18      PT SHORT TERM GOAL #3   Title  Patient ambulates 500' with RW & negotiates ramps / curbs with prosthesis modified independent.     Time  1    Period  Months    Status  On-going    Target Date  07/05/18      PT SHORT TERM GOAL #4   Title  Patient ambulates 200' with cane & prosthesis with minA.     Time  1    Period  Months    Status  On-going    Target Date  07/05/18      PT SHORT TERM GOAL #5   Title  Patient able to pick up object from floor with supervision.     Time  1    Period  Months    Status  On-going    Target Date  07/05/18        PT Long Term Goals - 06/13/18 1334      PT LONG TERM GOAL #1   Title  Patient verbalizes & demonstrates proper prosthetic care to enable safe use of prosthesis. (All LTGs Target Date: 08/31/2018)    Time  3    Period  Months    Status  On-going    Target Date  08/31/18      PT LONG TERM GOAL #2   Title  Patient tolerates prosthesis wear >90% of awake hours without skin issues or limb pain to enable function throughout his day.     Time  3    Period  Months    Status  On-going    Target Date  08/31/18      PT LONG TERM GOAL #3   Title  Berg Balance >45/56 to indicate low fall risk.     Time  3    Period  Months    Status  On-going    Target Date  08/31/18      PT LONG TERM GOAL #4   Title  Patient ambulates 500' outdoors including grass, ramps & curbs with cane or less & prosthesis modified independent for community mobility.     Time  3    Period  Months    Status  On-going    Target Date  08/31/18  PT LONG TERM GOAL #5   Title  Patient  able to perform lifting 25# boxes, carrying, pushing, pulling and climbing A-frame ladder to enable patient goals to return to yard work.     Time  3    Period  Months    Status  On-going    Target Date  08/31/18            Plan - 06/19/18 2148    Clinical Impression Statement  Todays skilled session focused on indoor/outdoor gait with rollator/prosthesis while negotiating curbs/ramps for an increased distance and education on the use and benefits of a rollator walker. Pt is making progress and should benefit from continued PT sessions to continue to progress towards goals.     Rehab Potential  Good    PT Frequency  2x / week    PT Duration  12 weeks    PT Treatment/Interventions  ADLs/Self Care Home Management;Canalith Repostioning;DME Instruction;Gait training;Stair training;Functional mobility training;Therapeutic activities;Therapeutic exercise;Balance training;Neuromuscular re-education;Patient/family education;Prosthetic Training;Manual techniques;Vestibular    PT Next Visit Plan  review prosthetic care and prosthetic gait with Rollator including ramps, curbs & stairs 2 rails, balance activities.    Consulted and Agree with Plan of Care  Patient;Family member/caregiver    Family Member Consulted  significant other, Jed Limerick       Patient will benefit from skilled therapeutic intervention in order to improve the following deficits and impairments:  Abnormal gait, Decreased activity tolerance, Decreased balance, Decreased endurance, Decreased knowledge of use of DME, Decreased mobility, Decreased strength, Decreased skin integrity, Dizziness, Impaired flexibility, Postural dysfunction, Prosthetic Dependency  Visit Diagnosis: Other abnormalities of gait and mobility  Muscle weakness (generalized)  Unsteadiness on feet     Problem List Patient Active Problem List   Diagnosis Date Noted  . Dehiscence of amputation stump ([place])   . History of right below knee amputation  ([place]) 11/08/2017  . Hypoalbuminemia due to protein-calorie malnutrition ([place])   . Urinary retention   . Acute blood loss anemia   . Status post below knee amputation, right ([place]) 10/20/2017  . Unilateral complete BKA ([place]) 10/20/2017  . Sepsis ([place]) 10/12/2017  . Diabetic foot ulcer ([place]) 10/12/2017  . Diabetic foot infection ([place]) 10/12/2017  . Hyponatremia 10/12/2017  . Chronic systolic CHF (congestive heart failure) ([place])   . Hyperkalemia 10/13/2015  . Congestive dilated cardiomyopathy ([place]) 07/13/2015  . Asymptomatic LV dysfunction 06/25/2015  . Poorly controlled diabetes mellitus ([place]) 05/31/2015  . HTN (hypertension), benign 07/04/2014  . Viral cardiomyopathy, EF improved to 45-50% 04/02/2013  . Obesity 04/02/2013  . Diabetic foot ulcer with osteomyelitis ([place]) 04/02/2013  . Hyperlipidemia 04/02/2013  . Sinus tachycardia 04/02/2013    , PTA   A  06/19/2018, 9:56 PM  [facility] [address] [place], [place], [address] Phone: [phone]   Fax:  [phone]  Name: BRADIE LACOCK MRN: [id number] Date of Birth: [birth year]

## 2018-06-20 DIAGNOSIS — R609 Edema, unspecified: Secondary | ICD-10-CM | POA: Diagnosis not present

## 2018-06-20 DIAGNOSIS — L0390 Cellulitis, unspecified: Secondary | ICD-10-CM | POA: Diagnosis not present

## 2018-06-20 NOTE — Patient Outreach (Addendum)
[facility]) Care Management  [place]   06/20/2018  KHAN CHURA [birth year] [id number]  Reason for referral: Medication Assistance  Referral source: patient  Referral medication(s): [name] Current insurance:[employer]   PMHx: CHF, below the knee amputation (right), hyperlipidemia, urinary retention, cardiomyopathy, type 2 diabetes,and hypertension.   Patient was hospitalized 08/19 for enlarged prostate with urinary obstruction.    Patient called to inquire about patient assistance with [name] since [facility] Pharmacist had helped him before with getting assistance with [facility]  Patient reported [name] would cost his $90 and that he could not use the copay card given to him by his provider.   Objective: Allergies  Allergen Reactions  . Sulfur Rash  . Norvasc [Amlodipine Besylate] Rash    Medications Reviewed Today    Reviewed by [name], [facility] (Pharmacist) on 06/20/18 at 1023  Med List Status: <None>  Medication Order Taking? Sig Documenting Provider Last Dose Status Informant  acetaminophen (TYLENOL) 325 MG tablet [id number] Yes Take 1-2 tablets (325-650 mg total) by mouth every 4 (four) hours as needed for mild pain. Bary Leriche, PA-C Taking Active Self  blood glucose meter kit and supplies KIT [id number] Yes Dispense based on patient and insurance preference. Use up to four times daily as directed. (FOR ICD-10: E 11.65). Bary Leriche, PA-C Taking Active Self  furosemide (LASIX) 40 MG tablet [id number] Yes Take 40 mg by mouth daily as needed for fluid or edema.  [provider] Taking Active Self  glimepiride (AMARYL) 4 MG tablet [id number] Yes Take 0.5 tablets (2 mg total) by mouth every morning. Bary Leriche, PA-C Taking Active Self  Insulin Glargine (LANTUS) 100 UNIT/ML Solostar Pen [id number] Yes Inject 24 Units into the skin at bedtime. Bary Leriche, PA-C Taking Active Self  Insulin Pen Needle (CAREFINE PEN  NEEDLES) 31G X 6 MM MISC [id number] Yes 1 application by Does not apply route daily. Bary Leriche, PA-C Taking Active Self  Lancets (ONETOUCH DELICA PLUS ZCHYIF02D) [place] [id number] Yes Use to test blood sugar daily [provider] Taking Active   metoprolol succinate (TOPROL-XL) 25 MG 24 hr tablet [id number] Yes Take 1 tablet (25 mg total) by mouth every evening. Bary Leriche, PA-C Taking Active Self  mupirocin ointment (BACTROBAN) 2 % [id number] Yes Apply to affected area three times daily for 5 days [provider] Taking Active   ONE TOUCH ULTRA TEST test strip [id number] Yes Use to test blood sugar daily [provider] Taking Active   rosuvastatin (CRESTOR) 10 MG tablet [id number] Yes Take 10 mg by mouth every evening.  [provider] Taking Active Self  sacubitril-valsartan (ENTRESTO) 97-103 MG [id number] Yes Take 1 tablet by mouth 2 (two) times daily. [provider] Taking Active Self  spironolactone (ALDACTONE) 25 MG tablet [id number] Yes Take 1 tablet by mouth daily. Nigel Mormon, MD Taking Active   tamsulosin (FLOMAX) 0.4 MG CAPS capsule [id number] No Take 2 capsules (0.8 mg total) by mouth daily after supper.  Patient not taking:  Reported on 06/20/2018   Bary Leriche, PA-C Not Taking Active Self          Assessment:  Drugs sorted by system:  Cardiovascular: Furosemide, Metoprolol, Rosuvastatin, Entreso, Spironolactone  Endocrine: Glimepiride, Lantus,   Topical: Mupirocin  Pain: Acetaminophen  Genitourinary: Tamsulosin (patient said not taking)  Medication Review Findings:  . Patient requested help with [name] but it is not on his current medication list.  Review  of the patient's chart shows it discontinued in March of 2019.  However, the patient sees a cardiologist who is not on the [place].    . Increased risk of Hyperkalemia with Entresto and Spironolactone  . Adherence-Tamsulosin-patient reported not  taking because the his provider did not tell him to continue.     Medication Assistance Findings:  Extra Help:   '[]'  Already receiving Full Extra Help  '[]'  Already receiving Partial Extra Help  '[x]'  Eligible based on reported income and assets  '[]'  Not Eligible based on reported income and assets  Patient Assistance Programs: 1) [name]  made by [employer] o Income requirement met: '[x]'  Yes '[]'  No '[]'  Unknown o Out-of-pocket prescription expenditure met:    '[]'  Yes '[]'  No  '[x]'  Unknown  '[]'  Not applicable  *[employer] program requires PA completion and potentially proof of not being eligible for LIS.  -[name] is a tier 4 medication with HTA and requires PA.  IT is unclear if the patient has gone though these channels.  An email was sent to Ples Specter with HTA.  --Called [employer]--The Heart Failure program is out of funds.       Additional medication assistance options reviewed with patient as warranted:  No other options identified  Plan: I will route patient assistance letter to [place] technician who will coordinate patient assistance program application process for medications listed above.  [facility] pharmacy technician will assist with obtaining all required documents from both patient and provider(s) and submit application(s) once completed.    Route note to Dr. Virgina Jock  Follow up in 7-10 business days.   [name], PharmD, [place] Clinical Pharmacist [phone]

## 2018-06-22 ENCOUNTER — Other Ambulatory Visit: Payer: Self-pay | Admitting: Pharmacy Technician

## 2018-06-22 NOTE — Patient Outreach (Signed)
[facility]) Care Management  06/22/2018  Brian Herring 1956-07-14 [id number]    Received [employer] patient assistance referral for 2019 from [place] for [employer].   Received renewal [employer] patient assistance referral for 2020 from [place] for [place].  Prepared patient portion to be mailed. Faxed provider portions to Dr. Vernell Leep.  Will followup with patient in 7-10 business days to confirm application has been received.  Ketrina Boateng P. Brainard Highfill, [place] Management [phone]

## 2018-06-25 DIAGNOSIS — E113411 Type 2 diabetes mellitus with severe nonproliferative diabetic retinopathy with macular edema, right eye: Secondary | ICD-10-CM | POA: Diagnosis not present

## 2018-06-25 DIAGNOSIS — E113412 Type 2 diabetes mellitus with severe nonproliferative diabetic retinopathy with macular edema, left eye: Secondary | ICD-10-CM | POA: Diagnosis not present

## 2018-06-25 DIAGNOSIS — H2511 Age-related nuclear cataract, right eye: Secondary | ICD-10-CM | POA: Diagnosis not present

## 2018-06-25 DIAGNOSIS — H2512 Age-related nuclear cataract, left eye: Secondary | ICD-10-CM | POA: Diagnosis not present

## 2018-06-26 DIAGNOSIS — E113412 Type 2 diabetes mellitus with severe nonproliferative diabetic retinopathy with macular edema, left eye: Secondary | ICD-10-CM | POA: Diagnosis not present

## 2018-06-27 ENCOUNTER — Encounter: Payer: Self-pay | Admitting: Physical Therapy

## 2018-06-27 ENCOUNTER — Ambulatory Visit: Payer: PPO | Attending: Physician Assistant | Admitting: Physical Therapy

## 2018-06-27 DIAGNOSIS — R2681 Unsteadiness on feet: Secondary | ICD-10-CM

## 2018-06-27 DIAGNOSIS — R2689 Other abnormalities of gait and mobility: Secondary | ICD-10-CM

## 2018-06-27 DIAGNOSIS — M6281 Muscle weakness (generalized): Secondary | ICD-10-CM | POA: Diagnosis not present

## 2018-06-27 DIAGNOSIS — Z89511 Acquired absence of right leg below knee: Secondary | ICD-10-CM | POA: Insufficient documentation

## 2018-06-27 DIAGNOSIS — R293 Abnormal posture: Secondary | ICD-10-CM | POA: Insufficient documentation

## 2018-06-27 NOTE — Therapy (Signed)
[facility] [address] [place], [place], [address] Phone: [phone]   Fax:  [phone]  Physical Therapy Treatment  Patient Details  Name: Brian Herring MRN: [id number] Date of Birth: 1955/12/15 Referring Provider (PT): Milas Gain, [place]   Encounter Date: 06/27/2018  PT End of Session - 06/27/18 1336    Visit Number  5    Number of Visits  25    Date for PT Re-Evaluation  09/03/18    Authorization Type  [employer]; $15 co-pay. $3400 oop & $160.49 applied pre-eval    PT Start Time  1026    PT Stop Time  1104    PT Time Calculation (min)  38 min    Equipment Utilized During Treatment  Gait belt    Activity Tolerance  Patient tolerated treatment well    Behavior During Therapy  WFL for tasks assessed/performed       Past Medical History:  Diagnosis Date  . Arthritis    shoulder  . Cancer ([place])    skin cancer on nose  . Cardiomyopathy ([place])   . Chronic systolic CHF (congestive heart failure) ([place])   . Diabetes mellitus without complication ([place])   . Hyperlipidemia   . Hypertension     Past Surgical History:  Procedure Laterality Date  . AMPUTATION Right 10/14/2017   Procedure: AMPUTATION  GREAT TOE;  Surgeon: [name], MD;  Location: [facility];  Service: Orthopedics;  Laterality: Right;  . AMPUTATION Right 10/18/2017   Procedure: RIGHT BELOW KNEE AMPUTATION;  Surgeon: [name], MD;  Location: [facility];  Service: Orthopedics;  Laterality: Right;  . CYSTOSCOPY WITH INSERTION OF UROLIFT N/A 03/15/2018   Procedure: CYSTOSCOPY WITH INSERTION OF UROLIFT;  Surgeon: Franchot Gallo, MD;  Location: [facility];  Service: Urology;  Laterality: N/A;  . SHOULDER SURGERY  01/05  . STUMP REVISION Right 12/15/2017   Procedure: REVISION RIGHT BELOW KNEE AMPUTATION;  Surgeon: [name], MD;  Location: [facility];  Service: Orthopedics;  Laterality: Right;    There were no vitals filed for  this visit.  Subjective Assessment - 06/27/18 1028    Subjective  No falls. Walking at home with RW.    Pertinent History  R TTA, DM2, cardiomyopathy, tachycardia, CHF, HTN, obesity, HLD, arthritis,     Limitations  Lifting;Standing;House hold activities;Walking    Patient Stated Goals  Go back to work part-time doing something, walk with prosthesis, get back where I used to be    Currently in Pain?  No/denies                       [facility] Adult PT Treatment/Exercise - 06/27/18 0001      Transfers   Transfers  Sit to Stand;Stand to Sit    Sit to Stand  5: Supervision;With upper extremity assist;From chair/3-in-1    Stand to Sit  5: Supervision;With upper extremity assist;To chair/3-in-1      Ambulation/Gait   Ambulation/Gait  Yes    Ambulation/Gait Assistance  5: Supervision    Ambulation/Gait Assistance Details  working on steplength, posture, and proper weight shifting    Ambulation Distance (Feet)  250 Feet    Assistive device  Rolling walker;Prosthesis    Gait Pattern  Decreased stance time - right;Decreased step length - left;Decreased stride length;Lateral hip instability;Trunk flexed;Decreased weight shift to right;Decreased hip/knee flexion - right;Left flexed knee in stance;Antalgic;Step-through pattern    Ambulation Surface  Level;Indoor  Ramp  5: Supervision    Ramp Details (indicate cue type and reason)  cues for step length and weight shifting    Curb  5: Supervision    Curb Details (indicate cue type and reason)  with RW    Pre-Gait Activities  In parallel bars, working on proper weight shifting , posture, and pelvic rotation with manual and verbal cues progressed to manual resistence at ASIS.                                  Prosthetics   Prosthetic Care Comments   Recommend increase wear time 6 hrs 2x/day    Current prosthetic wear tolerance (days/week)   daily     Current prosthetic wear tolerance (#hours/day)   wearing 5hr/day 2x /day     Edema      Residual limb condition   Skin check:  No open areas, redness & shiny distal limb with preblister appearance with one 32mm circle of dry skin that could have been blister,  good hair growth proximal to incision,     Education Provided  Skin check;Residual limb care;Correct ply sock adjustment    Person(s) Educated  Patient    Education Method  Explanation;Demonstration    Education Method  Verbalized understanding;Returned demonstration    Donning Prosthesis  Supervision             PT Education - 06/27/18 1335    Education Details  Encouraged pt to walk into therapy with RW vs W/C.    Person(s) Educated  Patient    Methods  Explanation    Comprehension  Verbalized understanding       PT Short Term Goals - 06/13/18 1333      PT SHORT TERM GOAL #1   Title  Patient verbalizes proper cleaning & demonstrates proper donning of prosthesis. (All STGs Target Date: 07/05/2018)    Time  1    Period  Months    Status  On-going    Target Date  07/05/18      PT SHORT TERM GOAL #2   Title  Patient tolerates prosthesis wear >12 hrs total / day without skin issues.     Time  1    Period  Months    Status  On-going    Target Date  07/05/18      PT SHORT TERM GOAL #3   Title  Patient ambulates 500' with RW & negotiates ramps / curbs with prosthesis modified independent.     Time  1    Period  Months    Status  On-going    Target Date  07/05/18      PT SHORT TERM GOAL #4   Title  Patient ambulates 200' with cane & prosthesis with minA.     Time  1    Period  Months    Status  On-going    Target Date  07/05/18      PT SHORT TERM GOAL #5   Title  Patient able to pick up object from floor with supervision.     Time  1    Period  Months    Status  On-going    Target Date  07/05/18        PT Long Term Goals - 06/13/18 1334      PT LONG TERM GOAL #1   Title  Patient verbalizes & demonstrates proper prosthetic care to enable  safe use of prosthesis. (All LTGs Target Date:  08/31/2018)    Time  3    Period  Months    Status  On-going    Target Date  08/31/18      PT LONG TERM GOAL #2   Title  Patient tolerates prosthesis wear >90% of awake hours without skin issues or limb pain to enable function throughout his day.     Time  3    Period  Months    Status  On-going    Target Date  08/31/18      PT LONG TERM GOAL #3   Title  Berg Balance >45/56 to indicate low fall risk.     Time  3    Period  Months    Status  On-going    Target Date  08/31/18      PT LONG TERM GOAL #4   Title  Patient ambulates 500' outdoors including grass, ramps & curbs with cane or less & prosthesis modified independent for community mobility.     Time  3    Period  Months    Status  On-going    Target Date  08/31/18      PT LONG TERM GOAL #5   Title  Patient able to perform lifting 25# boxes, carrying, pushing, pulling and climbing A-frame ladder to enable patient goals to return to yard work.     Time  3    Period  Months    Status  On-going    Target Date  08/31/18            Plan - 06/27/18 1343    Clinical Impression Statement  Skilled session focused on residual limb care and gait mechanics with RW.  Pt demonstrates increases understanding of gait mechanics progressing with posture, steplength, and proper weight shifting with cues at supervision level,    Rehab Potential  Good    PT Frequency  2x / week    PT Duration  12 weeks    PT Treatment/Interventions  ADLs/Self Care Home Management;Canalith Repostioning;DME Instruction;Gait training;Stair training;Functional mobility training;Therapeutic activities;Therapeutic exercise;Balance training;Neuromuscular re-education;Patient/family education;Prosthetic Training;Manual techniques;Vestibular    PT Next Visit Plan  review prosthetic care and prosthetic gait with Rollator including ramps, curbs & stairs 2 rails, balance activities.    Consulted and Agree with Plan of Care  Patient;Family member/caregiver    Family  Member Consulted  significant other, Jed Limerick       Patient will benefit from skilled therapeutic intervention in order to improve the following deficits and impairments:  Abnormal gait, Decreased activity tolerance, Decreased balance, Decreased endurance, Decreased knowledge of use of DME, Decreased mobility, Decreased strength, Decreased skin integrity, Dizziness, Impaired flexibility, Postural dysfunction, Prosthetic Dependency  Visit Diagnosis: Other abnormalities of gait and mobility  Muscle weakness (generalized)  Unsteadiness on feet     Problem List Patient Active Problem List   Diagnosis Date Noted  . Dehiscence of amputation stump ([place])   . History of right below knee amputation ([place]) 11/08/2017  . Hypoalbuminemia due to protein-calorie malnutrition ([place])   . Urinary retention   . Acute blood loss anemia   . Status post below knee amputation, right ([place]) 10/20/2017  . Unilateral complete BKA ([place]) 10/20/2017  . Sepsis ([place]) 10/12/2017  . Diabetic foot ulcer ([place]) 10/12/2017  . Diabetic foot infection ([place]) 10/12/2017  . Hyponatremia 10/12/2017  . Chronic systolic CHF (congestive heart failure) ([place])   . Hyperkalemia 10/13/2015  . Congestive dilated cardiomyopathy (  [place]) 07/13/2015  . Asymptomatic LV dysfunction 06/25/2015  . Poorly controlled diabetes mellitus ([place]) 05/31/2015  . HTN (hypertension), benign 07/04/2014  . Viral cardiomyopathy, EF improved to 45-50% 04/02/2013  . Obesity 04/02/2013  . Diabetic foot ulcer with osteomyelitis ([place]) 04/02/2013  . Hyperlipidemia 04/02/2013  . Sinus tachycardia 04/02/2013    Bjorn Loser, PTA  06/27/18, 1:47 PM [facility] [address] [place], [place], [address] Phone: [phone]   Fax:  [phone]  Name: Brian Herring MRN: [id number] Date of Birth: 07/09/1956

## 2018-06-29 ENCOUNTER — Ambulatory Visit: Payer: PPO | Admitting: Physical Therapy

## 2018-06-29 DIAGNOSIS — M6281 Muscle weakness (generalized): Secondary | ICD-10-CM

## 2018-06-29 DIAGNOSIS — R2689 Other abnormalities of gait and mobility: Secondary | ICD-10-CM

## 2018-06-29 DIAGNOSIS — R2681 Unsteadiness on feet: Secondary | ICD-10-CM

## 2018-06-29 NOTE — Therapy (Signed)
[facility] [address] [place], [place], [address] Phone: [phone]   Fax:  [phone]  Physical Therapy Treatment  Patient Details  Name: Brian Herring MRN: [id number] Date of Birth: 02-25-1956 Referring Provider (PT): Milas Gain, [place]   Encounter Date: 06/29/2018  PT End of Session - 06/29/18 1214    Visit Number  6    Number of Visits  25    Date for PT Re-Evaluation  09/03/18    Authorization Type  [employer] Medicare; $15 co-pay. $3400 oop & $160.49 applied pre-eval    PT Start Time  1016    PT Stop Time  1100    PT Time Calculation (min)  44 min    Equipment Utilized During Treatment  Gait belt    Activity Tolerance  Patient tolerated treatment well    Behavior During Therapy  [facility] for tasks assessed/performed       Past Medical History:  Diagnosis Date  . Arthritis    shoulder  . Cancer ([place])    skin cancer on nose  . Cardiomyopathy ([place])   . Chronic systolic CHF (congestive heart failure) ([place])   . Diabetes mellitus without complication ([place])   . Hyperlipidemia   . Hypertension     Past Surgical History:  Procedure Laterality Date  . AMPUTATION Right 10/14/2017   Procedure: AMPUTATION  GREAT TOE;  Surgeon: [name], MD;  Location: [facility];  Service: Orthopedics;  Laterality: Right;  . AMPUTATION Right 10/18/2017   Procedure: RIGHT BELOW KNEE AMPUTATION;  Surgeon: [name], MD;  Location: [facility];  Service: Orthopedics;  Laterality: Right;  . CYSTOSCOPY WITH INSERTION OF UROLIFT N/A 03/15/2018   Procedure: CYSTOSCOPY WITH INSERTION OF UROLIFT;  Surgeon: Franchot Gallo, MD;  [facility];  Service: Urology;  Laterality: N/A;  . SHOULDER SURGERY  01/05  . STUMP REVISION Right 12/15/2017   Procedure: REVISION RIGHT BELOW KNEE AMPUTATION;  Surgeon: [name], MD;  Location: [facility];  Service: Orthopedics;  Laterality: Right;    There were no vitals filed for  this visit.  Subjective Assessment - 06/29/18 1203    Subjective  No falls to report. He has been using RW at home still and no issues with increase in wear time.    Pertinent History  R TTA, DM2, cardiomyopathy, tachycardia, CHF, HTN, obesity, HLD, arthritis,     Limitations  Lifting;Standing;House hold activities;Walking    Patient Stated Goals  Go back to work part-time doing something, walk with prosthesis, get back where I used to be    Currently in Pain?  No/denies           [facility] Adult PT Treatment/Exercise - 06/29/18 1204      Ambulation/Gait   Ambulation/Gait  Yes    Ambulation/Gait Assistance  6: Modified independent (Device/Increase time)    Ambulation/Gait Assistance Details  VCs fro posture and to remain closer to RW    Ambulation Distance (Feet)  520 Feet   520x1;230x1   Assistive device  Rolling walker;Prosthesis    Gait Pattern  Decreased stance time - right;Decreased step length - left;Decreased stride length;Lateral hip instability;Trunk flexed;Decreased weight shift to right;Decreased hip/knee flexion - right;Left flexed knee in stance;Antalgic;Step-through pattern    Ambulation Surface  Level;Indoor    Ramp  6: Modified independent (Device)    Ramp Details (indicate cue type and reason)  VCs for sequence, weight should be towards toes while ascending ramp and weight more  in heels when descendng ramp.    Curb  6: Modified independent (Device/increase time)    Curb Details (indicate cue type and reason)  VCs for sequence    Gait Comments  Pt ambulated without signs of instability but required cues to decrease UE use and to remain upright/within RW. Pt demonstrated increased time and effort when ambulating on ramp.      Prosthetics   Prosthetic Care Comments   Discussed proper sock mgt, increasing activity within the home using RW, encouraged pt to donn prosthesis first thing in the morning but continue with recommended wear time of 2x6hrs.    Current prosthetic wear  tolerance (days/week)   daily     Current prosthetic wear tolerance (#hours/day)   wearing 6hr/day 2x /day     Residual limb condition   Skin check:  No open areas, redness & shiny distal limb with preblister appearance with one 58m circle of dry skin that could have been blister,  good hair growth proximal to incision,     Education Provided  Skin check;Residual limb care;Correct ply sock adjustment;Ply sock cleaning;Proper wear schedule/adjustment;Proper weight-bearing schedule/adjustment    Person(s) Educated  Patient    Education Method  Explanation    Education Method  Verbalized understanding;Returned demonstration;Needs further instruction    Donning Prosthesis  Independent             PT Education - 06/29/18 1213    Education Details  Pt educated on increased activity within home with ambulation around home taking place once an hour. Further education can be see in prostheteic care comments.    Person(s) Educated  Patient    Methods  Explanation    Comprehension  Verbalized understanding       PT Short Term Goals - 06/29/18 1028      PT SHORT TERM GOAL #1   Title  Patient verbalizes proper cleaning & demonstrates proper donning of prosthesis. (All STGs Target Date: 07/05/2018)    Baseline  Per pt 06/29/2018    Time  1    Period  Months    Status  Achieved      PT SHORT TERM GOAL #2   Title  Patient tolerates prosthesis wear >12 hrs total / day without skin issues.     Baseline  Per pt: Has worn prosthesis 12 hrs for last 3 days    Time  1    Period  Months    Status  Achieved      PT SHORT TERM GOAL #3   Title  Patient ambulates 500' with RW & negotiates ramps / curbs with prosthesis modified independent.     Baseline  Patient was able to ambulate 520 ft, perform curb, and ramps mod I    Time  1    Period  Months    Status  Achieved      PT SHORT TERM GOAL #4   Title  Patient ambulates 200' with cane & prosthesis with minA.     Time  1    Period  Months     Status  On-going      PT SHORT TERM GOAL #5   Title  Patient able to pick up object from floor with supervision.     Baseline  Pt able to pick item off floor at supervision without use of UE/AD 06/29/2028    Time  1    Status  Achieved        PT Long Term Goals - 06/13/18  [place] #1   Title  Patient verbalizes & demonstrates proper prosthetic care to enable safe use of prosthesis. (All LTGs Target Date: 08/31/2018)    Time  3    Period  Months    Status  On-going    Target Date  08/31/18      PT LONG TERM GOAL #2   Title  Patient tolerates prosthesis wear >90% of awake hours without skin issues or limb pain to enable function throughout his day.     Time  3    Period  Months    Status  On-going    Target Date  08/31/18      PT LONG TERM GOAL #3   Title  Berg Balance >45/56 to indicate low fall risk.     Time  3    Period  Months    Status  On-going    Target Date  08/31/18      PT LONG TERM GOAL #4   Title  Patient ambulates 500' outdoors including grass, ramps & curbs with cane or less & prosthesis modified independent for community mobility.     Time  3    Period  Months    Status  On-going    Target Date  08/31/18      PT LONG TERM GOAL #5   Title  Patient able to perform lifting 25# boxes, carrying, pushing, pulling and climbing A-frame ladder to enable patient goals to return to yard work.     Time  3    Period  Months    Status  On-going    Target Date  08/31/18            Plan - 06/29/18 1214    Clinical Impression Statement  Therapy treatment today focused on checking STGs which pt has met with the exception of ambulation with a cane goal.  Pt verbalized independent self care and understanding of gradual activity increase with  safety awareness. Pt would benefit from further physcial therapy to increase function independence and work towards [place].    Clinical Presentation  Evolving    Clinical Decision Making  Moderate    Rehab  Potential  Good    PT Frequency  2x / week    PT Duration  12 weeks    PT Treatment/Interventions  ADLs/Self Care Home Management;Canalith Repostioning;DME Instruction;Gait training;Stair training;Functional mobility training;Therapeutic activities;Therapeutic exercise;Balance training;Neuromuscular re-education;Patient/family education;Prosthetic Training;Manual techniques;Vestibular    PT Next Visit Plan  Prosthetic gait training, ? assess last STG of ambulation with cane (Pt has not yet used cane). Gait training with cane at table/counter top/parallel bars to begin with, balance activities to address posture and decr UE support.    Consulted and Agree with Plan of Care  Patient       Patient will benefit from skilled therapeutic intervention in order to improve the following deficits and impairments:  Abnormal gait, Decreased activity tolerance, Decreased balance, Decreased endurance, Decreased knowledge of use of DME, Decreased mobility, Decreased strength, Decreased skin integrity, Dizziness, Impaired flexibility, Postural dysfunction, Prosthetic Dependency  Visit Diagnosis: Other abnormalities of gait and mobility  Unsteadiness on feet  Muscle weakness (generalized)     Problem List Patient Active Problem List   Diagnosis Date Noted  . Dehiscence of amputation stump ([place])   . History of right below knee amputation ([place]) 11/08/2017  . Hypoalbuminemia due to protein-calorie malnutrition ([place])   . Urinary retention   .  Acute blood loss anemia   . Status post below knee amputation, right ([place]) 10/20/2017  . Unilateral complete BKA ([place]) 10/20/2017  . Sepsis ([place]) 10/12/2017  . Diabetic foot ulcer ([place]) 10/12/2017  . Diabetic foot infection ([place]) 10/12/2017  . Hyponatremia 10/12/2017  . Chronic systolic CHF (congestive heart failure) ([place])   . Hyperkalemia 10/13/2015  . Congestive dilated cardiomyopathy ([place]) 07/13/2015  . Asymptomatic LV dysfunction 06/25/2015  . Poorly  controlled diabetes mellitus ([place]) 05/31/2015  . HTN (hypertension), benign 07/04/2014  . Viral cardiomyopathy, EF improved to 45-50% 04/02/2013  . Obesity 04/02/2013  . Diabetic foot ulcer with osteomyelitis ([place]) 04/02/2013  . Hyperlipidemia 04/02/2013  . Sinus tachycardia 04/02/2013    Brian Herring SPTA 06/29/2018, 12:21 PM  [facility] [address] [place], [place], [address] Phone: [phone]   Fax:  [phone]  Name: Brian Herring MRN: [id number] Date of Birth: [birth year]

## 2018-07-03 ENCOUNTER — Other Ambulatory Visit: Payer: Self-pay | Admitting: Pharmacist

## 2018-07-04 ENCOUNTER — Ambulatory Visit: Payer: PPO | Admitting: Physical Therapy

## 2018-07-04 ENCOUNTER — Encounter: Payer: Self-pay | Admitting: Physical Therapy

## 2018-07-04 DIAGNOSIS — R2689 Other abnormalities of gait and mobility: Secondary | ICD-10-CM

## 2018-07-04 DIAGNOSIS — R293 Abnormal posture: Secondary | ICD-10-CM

## 2018-07-04 DIAGNOSIS — R2681 Unsteadiness on feet: Secondary | ICD-10-CM

## 2018-07-04 NOTE — Patient Outreach (Signed)
[facility]) Care Management  07/04/2018  Brian Herring 07/04/1956 [id number]   Patient called and inquired about the required income documentation required for the applications that were mailed to him. HIPAA identifiers were obtained.   Patient was advised to send the Social Security Benefits Letter. He stated he worked in 2018 but not in 2019 so the income reflected was more than what he made this year.  Patient said she would mail the the forms back this week.   Plan: Susy Frizzle will follow up with the patient.   [name], PharmD, [facility] Clinical Pharmacist [phone]

## 2018-07-05 ENCOUNTER — Telehealth: Payer: Self-pay | Admitting: Physical Therapy

## 2018-07-05 NOTE — Therapy (Signed)
[facility] [address] [place], [place], [address] Phone: [phone]   Fax:  [phone]  Physical Therapy Treatment  Patient Details  Name: Brian Herring MRN: [id number] Date of Birth: [birth year] Referring Provider (PT): Milas Gain, [place]   Encounter Date: 07/04/2018  PT End of Session - 07/04/18 1028    Visit Number  7    Number of Visits  25    Date for PT Re-Evaluation  09/03/18    Authorization Type  [employer]; $15 co-pay. $3400 oop & $160.49 applied pre-eval    PT Start Time  1020    PT Stop Time  1100    PT Time Calculation (min)  40 min    Equipment Utilized During Treatment  Gait belt    Activity Tolerance  Patient tolerated treatment well    Behavior During Therapy  [facility] for tasks assessed/performed       Past Medical History:  Diagnosis Date  . Arthritis    shoulder  . Cancer ([place])    skin cancer on nose  . Cardiomyopathy ([place])   . Chronic systolic CHF (congestive heart failure) ([place])   . Diabetes mellitus without complication ([place])   . Hyperlipidemia   . Hypertension     Past Surgical History:  Procedure Laterality Date  . AMPUTATION Right 10/14/2017   Procedure: AMPUTATION  GREAT TOE;  Surgeon: [name], MD;  Location: [facility];  Service: Orthopedics;  Laterality: Right;  . AMPUTATION Right 10/18/2017   Procedure: RIGHT BELOW KNEE AMPUTATION;  Surgeon: [name], MD;  Location: [facility];  Service: Orthopedics;  Laterality: Right;  . CYSTOSCOPY WITH INSERTION OF UROLIFT N/A 03/15/2018   Procedure: CYSTOSCOPY WITH INSERTION OF UROLIFT;  Surgeon: Franchot Gallo, MD;  [facility];  Service: Urology;  Laterality: N/A;  . SHOULDER SURGERY  01/05  . STUMP REVISION Right 12/15/2017   Procedure: REVISION RIGHT BELOW KNEE AMPUTATION;  Surgeon: [name], MD;  Location: [facility];  Service: Orthopedics;  Laterality: Right;    There were no vitals filed  for this visit.  Subjective Assessment - 07/04/18 1025    Subjective  Having increased left hip pain today, unsure of the cause. He did go walk at the mall on Sunday. Notice the pain when walking out. Does not have it when starting out, it comes on while walking.     Pertinent History  R TTA, DM2, cardiomyopathy, tachycardia, CHF, HTN, obesity, HLD, arthritis,     Limitations  Lifting;Standing;House hold activities;Walking    Patient Stated Goals  Go back to work part-time doing something, walk with prosthesis, get back where I used to be    Currently in Pain?  Yes    Pain Score  7    0/10 at rest, pain with walking   Pain Location  Hip    Pain Orientation  Left    Pain Descriptors / Indicators  Aching;Other (Comment)   pinching   Pain Type  Acute pain    Pain Onset  In the past 7 days    Pain Frequency  Intermittent    Aggravating Factors   after walking a distance    Pain Relieving Factors  sitting down, resting          [facility] Adult PT Treatment/Exercise - 07/04/18 1029      Transfers   Transfers  Sit to Stand;Stand to Sit    Sit to Stand  6: Modified independent (Device/Increase  time)    Stand to Sit  6: Modified independent (Device/Increase time);With upper extremity assist      Ambulation/Gait   Ambulation/Gait  Yes    Ambulation/Gait Assistance  6: Modified independent (Device/Increase time);4: Min guard;4: Min assist    Ambulation/Gait Assistance Details  Mod I with RW. min guard to min assist for balance with cane. cues needed on posture, sequencing and cane placement. no significant balance issues noted with gait with cane.     Ambulation Distance (Feet)  100 Feet   x2, 80 x1 with RW; 125 x1 with cane   Assistive device  Rolling walker;Prosthesis;Straight cane   straight cane with rubber quad tip   Gait Pattern  Decreased stance time - right;Decreased step length - left;Decreased stride length;Lateral hip instability;Trunk flexed;Decreased weight shift to  right;Decreased hip/knee flexion - right;Left flexed knee in stance;Antalgic;Step-through pattern    Ambulation Surface  Level;Indoor      Prosthetics   Current prosthetic wear tolerance (days/week)   daily     Current prosthetic wear tolerance (#hours/day)   all awake hours, drying as needed    Residual limb condition   suture and dry scab removed from medial aspect of incision line. otherwise intact with no openings/wounds noted.     Education Provided  Residual limb care    Person(s) Educated  Patient    Education Method  Explanation;Demonstration;Verbal cues    Education Method  Verbalized understanding;Returned demonstration;Verbal cues required    Donning Prosthesis  Modified independent (device/increased time)    Doffing Prosthesis  Modified independent (device/increased time)          PT Short Term Goals - 06/29/18 1028      PT SHORT TERM GOAL #1   Title  Patient verbalizes proper cleaning & demonstrates proper donning of prosthesis. (All STGs Target Date: 07/05/2018)    Baseline  Per pt 06/29/2018    Time  1    Period  Months    Status  Achieved      PT SHORT TERM GOAL #2   Title  Patient tolerates prosthesis wear >12 hrs total / day without skin issues.     Baseline  Per pt: Has worn prosthesis 12 hrs for last 3 days    Time  1    Period  Months    Status  Achieved      PT SHORT TERM GOAL #3   Title  Patient ambulates 500' with RW & negotiates ramps / curbs with prosthesis modified independent.     Baseline  Patient was able to ambulate 520 ft, perform curb, and ramps mod I    Time  1    Period  Months    Status  Achieved      PT SHORT TERM GOAL #4   Title  Patient ambulates 200' with cane & prosthesis with minA.     Time  1    Period  Months    Status  On-going      PT SHORT TERM GOAL #5   Title  Patient able to pick up object from floor with supervision.     Baseline  Pt able to pick item off floor at supervision without use of UE/AD 06/29/2028    Time  1     Status  Achieved        PT Long Term Goals - 06/13/18 1334      PT LONG TERM GOAL #1   Title  Patient verbalizes & demonstrates proper prosthetic  care to enable safe use of prosthesis. (All LTGs Target Date: 08/31/2018)    Time  3    Period  Months    Status  On-going    Target Date  08/31/18      PT LONG TERM GOAL #2   Title  Patient tolerates prosthesis wear >90% of awake hours without skin issues or limb pain to enable function throughout his day.     Time  3    Period  Months    Status  On-going    Target Date  08/31/18      PT LONG TERM GOAL #3   Title  Berg Balance >45/56 to indicate low fall risk.     Time  3    Period  Months    Status  On-going    Target Date  08/31/18      PT LONG TERM GOAL #4   Title  Patient ambulates 500' outdoors including grass, ramps & curbs with cane or less & prosthesis modified independent for community mobility.     Time  3    Period  Months    Status  On-going    Target Date  08/31/18      PT LONG TERM GOAL #5   Title  Patient able to perform lifting 25# boxes, carrying, pushing, pulling and climbing A-frame ladder to enable patient goals to return to yard work.     Time  3    Period  Months    Status  On-going    Target Date  08/31/18            Plan - 07/04/18 1028    Clinical Impression Statement  Today's skilled session address prosthetic management, prosthetic needs for adjustments to height/alignment and initiated gait with cane. The pt is to follow up with the prosthetist tomorrow before next PT appt. The pt is progressing toward goals and should benefit from continued PT to progress toward unmet goals     Rehab Potential  Good    PT Frequency  2x / week    PT Duration  12 weeks    PT Treatment/Interventions  ADLs/Self Care Home Management;Canalith Repostioning;DME Instruction;Gait training;Stair training;Functional mobility training;Therapeutic activities;Therapeutic exercise;Balance training;Neuromuscular  re-education;Patient/family education;Prosthetic Training;Manual techniques;Vestibular    PT Next Visit Plan  continue with gait training with cane (rubber quad tip)/prostheis, begin to address balance/posture with decreased UE support.    Consulted and Agree with Plan of Care  Patient       Patient will benefit from skilled therapeutic intervention in order to improve the following deficits and impairments:  Abnormal gait, Decreased activity tolerance, Decreased balance, Decreased endurance, Decreased knowledge of use of DME, Decreased mobility, Decreased strength, Decreased skin integrity, Dizziness, Impaired flexibility, Postural dysfunction, Prosthetic Dependency  Visit Diagnosis: Other abnormalities of gait and mobility  Unsteadiness on feet  Abnormal posture     Problem List Patient Active Problem List   Diagnosis Date Noted  . Dehiscence of amputation stump ([place])   . History of right below knee amputation ([place]) 11/08/2017  . Hypoalbuminemia due to protein-calorie malnutrition ([place])   . Urinary retention   . Acute blood loss anemia   . Status post below knee amputation, right ([place]) 10/20/2017  . Unilateral complete BKA ([place]) 10/20/2017  . Sepsis ([place]) 10/12/2017  . Diabetic foot ulcer ([place]) 10/12/2017  . Diabetic foot infection ([place]) 10/12/2017  . Hyponatremia 10/12/2017  . Chronic systolic CHF (congestive heart failure) ([place])   . Hyperkalemia 10/13/2015  .  Congestive dilated cardiomyopathy ([place]) 07/13/2015  . Asymptomatic LV dysfunction 06/25/2015  . Poorly controlled diabetes mellitus ([place]) 05/31/2015  . HTN (hypertension), benign 07/04/2014  . Viral cardiomyopathy, EF improved to 45-50% 04/02/2013  . Obesity 04/02/2013  . Diabetic foot ulcer with osteomyelitis ([place]) 04/02/2013  . Hyperlipidemia 04/02/2013  . Sinus tachycardia 04/02/2013    Willow Ora, PTA, [facility] [address], [place], [place]  [address] [phone] 07/05/18, 12:45 AM   Name: NIJEL FLINK MRN: [id number] Date of Birth: [birth year]

## 2018-07-06 ENCOUNTER — Encounter: Payer: Self-pay | Admitting: Physical Therapy

## 2018-07-06 ENCOUNTER — Ambulatory Visit: Payer: PPO | Admitting: Physical Therapy

## 2018-07-06 ENCOUNTER — Ambulatory Visit: Payer: Self-pay | Admitting: Pharmacist

## 2018-07-06 DIAGNOSIS — R2681 Unsteadiness on feet: Secondary | ICD-10-CM

## 2018-07-06 DIAGNOSIS — R293 Abnormal posture: Secondary | ICD-10-CM

## 2018-07-06 DIAGNOSIS — R2689 Other abnormalities of gait and mobility: Secondary | ICD-10-CM | POA: Diagnosis not present

## 2018-07-06 DIAGNOSIS — M6281 Muscle weakness (generalized): Secondary | ICD-10-CM

## 2018-07-06 NOTE — Therapy (Signed)
[facility] [address] [place], [place], [address] Phone: [phone]   Fax:  [phone]  Physical Therapy Treatment  Patient Details  Name: Brian Herring MRN: [id number] Date of Birth: May 11, 1956 Referring Provider (PT): Milas Gain, [place]   Encounter Date: 07/06/2018  PT End of Session - 07/06/18 1023    Visit Number  8    Number of Visits  25    Date for PT Re-Evaluation  09/03/18    Authorization Type  [employer] Medicare; $15 co-pay. $3400 oop & $160.49 applied pre-eval    PT Start Time  1019    PT Stop Time  1100    PT Time Calculation (min)  41 min    Equipment Utilized During Treatment  Gait belt    Activity Tolerance  Patient tolerated treatment well    Behavior During Therapy  [facility] for tasks assessed/performed       Past Medical History:  Diagnosis Date  . Arthritis    shoulder  . Cancer ([place])    skin cancer on nose  . Cardiomyopathy ([place])   . Chronic systolic CHF (congestive heart failure) ([place])   . Diabetes mellitus without complication ([place])   . Hyperlipidemia   . Hypertension     Past Surgical History:  Procedure Laterality Date  . AMPUTATION Right 10/14/2017   Procedure: AMPUTATION  GREAT TOE;  Surgeon: [name], MD;  Location: [facility];  Service: Orthopedics;  Laterality: Right;  . AMPUTATION Right 10/18/2017   Procedure: RIGHT BELOW KNEE AMPUTATION;  Surgeon: [name], MD;  Location: [facility];  Service: Orthopedics;  Laterality: Right;  . CYSTOSCOPY WITH INSERTION OF UROLIFT N/A 03/15/2018   Procedure: CYSTOSCOPY WITH INSERTION OF UROLIFT;  Surgeon: Franchot Gallo, MD;  [facility];  Service: Urology;  Laterality: N/A;  . SHOULDER SURGERY  01/05  . STUMP REVISION Right 12/15/2017   Procedure: REVISION RIGHT BELOW KNEE AMPUTATION;  Surgeon: [name], MD;  Location: [facility];  Service: Orthopedics;  Laterality: Right;    There were no vitals filed  for this visit.  Subjective Assessment - 07/06/18 1022    Subjective  Had prosthesis height adjusted, was off by a 3/4 an inch. Feels much better with hip pain almost completely gone. No falls.     Pertinent History  R TTA, DM2, cardiomyopathy, tachycardia, CHF, HTN, obesity, HLD, arthritis,     Limitations  Lifting;Standing;House hold activities;Walking    Patient Stated Goals  Go back to work part-time doing something, walk with prosthesis, get back where I used to be    Currently in Pain?  No/denies    Pain Score  0-No pain           [facility] Adult PT Treatment/Exercise - 07/06/18 1023      Transfers   Transfers  Sit to Stand;Stand to Sit    Sit to Stand  6: Modified independent (Device/Increase time)    Stand to Sit  6: Modified independent (Device/Increase time);With upper extremity assist      Ambulation/Gait   Ambulation/Gait  Yes    Ambulation/Gait Assistance  4: Min assist    Ambulation/Gait Assistance Details  continued to work on use of cane with cane. pt noted to be more off balance with gait today vs with previous session. Noted that pt's right knee tended to stay flexed in stance. pelvic height checked with prosthesis being slightly longer. pt then stated he changed his shoes and has  on a higher heel with the current ones. the increased heel height is contributing to some of the knee flexion. a small heel wedge was added to the right shoe with improved balance on the next gait rep.     Ambulation Distance (Feet)  120 Feet   x2, 50 x1   Assistive device  Rolling walker;Prosthesis;Straight cane   cane with rubber quad tip   Gait Pattern  Decreased stance time - right;Decreased step length - left;Decreased stride length;Lateral hip instability;Trunk flexed;Decreased weight shift to right;Decreased hip/knee flexion - right;Left flexed knee in stance;Antalgic;Step-through pattern    Ambulation Surface  Level;Indoor      Prosthetics   Current prosthetic wear tolerance  (days/week)   daily     Current prosthetic wear tolerance (#hours/day)   all awake hours, drying as needed    Residual limb condition   intact with no issues per pt report    Education Provided  Residual limb care;Correct ply sock adjustment;Other (comment)   changing shoes on prosthesis   Person(s) Educated  Patient    Education Method  Explanation;Demonstration;Verbal cues    Education Method  Verbalized understanding;Returned demonstration;Verbal cues required;Needs further instruction    Donning Prosthesis  Modified independent (device/increased time)    Doffing Prosthesis  Modified independent (device/increased time)          PT Short Term Goals - 06/29/18 1028      PT SHORT TERM GOAL #1   Title  Patient verbalizes proper cleaning & demonstrates proper donning of prosthesis. (All STGs Target Date: 07/05/2018)    Baseline  Per pt 06/29/2018    Time  1    Period  Months    Status  Achieved      PT SHORT TERM GOAL #2   Title  Patient tolerates prosthesis wear >12 hrs total / day without skin issues.     Baseline  Per pt: Has worn prosthesis 12 hrs for last 3 days    Time  1    Period  Months    Status  Achieved      PT SHORT TERM GOAL #3   Title  Patient ambulates 500' with RW & negotiates ramps / curbs with prosthesis modified independent.     Baseline  Patient was able to ambulate 520 ft, perform curb, and ramps mod I    Time  1    Period  Months    Status  Achieved      PT SHORT TERM GOAL #4   Title  Patient ambulates 200' with cane & prosthesis with minA.     Time  1    Period  Months    Status  On-going      PT SHORT TERM GOAL #5   Title  Patient able to pick up object from floor with supervision.     Baseline  Pt able to pick item off floor at supervision without use of UE/AD 06/29/2028    Time  1    Status  Achieved        PT Long Term Goals - 06/13/18 1334      PT LONG TERM GOAL #1   Title  Patient verbalizes & demonstrates proper prosthetic care to  enable safe use of prosthesis. (All LTGs Target Date: 08/31/2018)    Time  3    Period  Months    Status  On-going    Target Date  08/31/18      PT LONG TERM GOAL #2  Title  Patient tolerates prosthesis wear >90% of awake hours without skin issues or limb pain to enable function throughout his day.     Time  3    Period  Months    Status  On-going    Target Date  08/31/18      PT LONG TERM GOAL #3   Title  Berg Balance >45/56 to indicate low fall risk.     Time  3    Period  Months    Status  On-going    Target Date  08/31/18      PT LONG TERM GOAL #4   Title  Patient ambulates 500' outdoors including grass, ramps & curbs with cane or less & prosthesis modified independent for community mobility.     Time  3    Period  Months    Status  On-going    Target Date  08/31/18      PT LONG TERM GOAL #5   Title  Patient able to perform lifting 25# boxes, carrying, pushing, pulling and climbing A-frame ladder to enable patient goals to return to yard work.     Time  3    Period  Months    Status  On-going    Target Date  08/31/18            Plan - 07/06/18 1023    Clinical Impression Statement  Todays's skilled session continued to address gait with prosthesis and cane. Also educated pt on effects of changing shoes on prosthesis and how it will affect the knee of that leg. The pt is progressing toward goals and should benefit from continued PT to progress toward unmet goals.     Rehab Potential  Good    PT Frequency  2x / week    PT Duration  12 weeks    PT Treatment/Interventions  ADLs/Self Care Home Management;Canalith Repostioning;DME Instruction;Gait training;Stair training;Functional mobility training;Therapeutic activities;Therapeutic exercise;Balance training;Neuromuscular re-education;Patient/family education;Prosthetic Training;Manual techniques;Vestibular    PT Next Visit Plan  continue with gait training with cane (rubber quad tip)/prostheis, begin to address  balance/posture with decreased UE support.    Consulted and Agree with Plan of Care  Patient       Patient will benefit from skilled therapeutic intervention in order to improve the following deficits and impairments:  Abnormal gait, Decreased activity tolerance, Decreased balance, Decreased endurance, Decreased knowledge of use of DME, Decreased mobility, Decreased strength, Decreased skin integrity, Dizziness, Impaired flexibility, Postural dysfunction, Prosthetic Dependency  Visit Diagnosis: Other abnormalities of gait and mobility  Unsteadiness on feet  Abnormal posture  Muscle weakness (generalized)     Problem List Patient Active Problem List   Diagnosis Date Noted  . Dehiscence of amputation stump ([place])   . History of right below knee amputation ([place]) 11/08/2017  . Hypoalbuminemia due to protein-calorie malnutrition ([place])   . Urinary retention   . Acute blood loss anemia   . Status post below knee amputation, right ([place]) 10/20/2017  . Unilateral complete BKA ([place]) 10/20/2017  . Sepsis ([place]) 10/12/2017  . Diabetic foot ulcer ([place]) 10/12/2017  . Diabetic foot infection ([place]) 10/12/2017  . Hyponatremia 10/12/2017  . Chronic systolic CHF (congestive heart failure) ([place])   . Hyperkalemia 10/13/2015  . Congestive dilated cardiomyopathy ([place]) 07/13/2015  . Asymptomatic LV dysfunction 06/25/2015  . Poorly controlled diabetes mellitus ([place]) 05/31/2015  . HTN (hypertension), benign 07/04/2014  . Viral cardiomyopathy, EF improved to 45-50% 04/02/2013  . Obesity 04/02/2013  . Diabetic foot ulcer  with osteomyelitis ([place]) 04/02/2013  . Hyperlipidemia 04/02/2013  . Sinus tachycardia 04/02/2013    Willow Ora, PTA, [facility] Outpatient Neuro [facility] [address] [place], [place] [address] [phone] 07/06/18, 12:36 PM   Name: Brian Herring MRN: [id number] Date of Birth: 12-Jan-1956

## 2018-07-09 NOTE — Telephone Encounter (Signed)
Created in error.   Willow Ora, PTA, [facility] [address], [place], [place] [address] [phone] 07/09/18, 12:52 PM

## 2018-07-10 ENCOUNTER — Other Ambulatory Visit: Payer: Self-pay | Admitting: Pharmacy Technician

## 2018-07-10 NOTE — Patient Outreach (Signed)
[facility]) Care Management  07/10/2018  LOGEN HEINTZELMAN 1956-03-07 [id number]   Received all necessary documents and signatures for [employer] application for [employer].  Submitted completed application via fax to [employer].  Will followup with company in 7-10 business days to see if a determination has been made.  Larin Depaoli P. Ainhoa Rallo, [place] Management [phone]

## 2018-07-11 ENCOUNTER — Encounter: Payer: Self-pay | Admitting: Physical Therapy

## 2018-07-11 ENCOUNTER — Ambulatory Visit: Payer: PPO | Admitting: Physical Therapy

## 2018-07-11 DIAGNOSIS — R2689 Other abnormalities of gait and mobility: Secondary | ICD-10-CM

## 2018-07-11 DIAGNOSIS — R293 Abnormal posture: Secondary | ICD-10-CM

## 2018-07-11 DIAGNOSIS — R2681 Unsteadiness on feet: Secondary | ICD-10-CM

## 2018-07-11 NOTE — Therapy (Signed)
[facility] [address] [place], [place], [address] Phone: [phone]   Fax:  [phone]  Physical Therapy Treatment  Patient Details  Name: Brian Herring MRN: [id number] Date of Birth: [birth year] Referring Provider (PT): Milas Gain, [place]   Encounter Date: 07/11/2018  PT End of Session - 07/11/18 1019    Visit Number  9    Number of Visits  25    Date for PT Re-Evaluation  09/03/18    Authorization Type  [employer] Medicare; $15 co-pay. $3400 oop & $160.49 applied pre-eval    PT Start Time  1017    PT Stop Time  1100    PT Time Calculation (min)  43 min    Equipment Utilized During Treatment  Gait belt    Activity Tolerance  Patient tolerated treatment well    Behavior During Therapy  WFL for tasks assessed/performed       Past Medical History:  Diagnosis Date  . Arthritis    shoulder  . Cancer ([place])    skin cancer on nose  . Cardiomyopathy ([place])   . Chronic systolic CHF (congestive heart failure) ([place])   . Diabetes mellitus without complication ([place])   . Hyperlipidemia   . Hypertension     Past Surgical History:  Procedure Laterality Date  . AMPUTATION Right 10/14/2017   Procedure: AMPUTATION  GREAT TOE;  Surgeon: [name], MD;  Location: [facility];  Service: Orthopedics;  Laterality: Right;  . AMPUTATION Right 10/18/2017   Procedure: RIGHT BELOW KNEE AMPUTATION;  Surgeon: [name], MD;  Location: [facility];  Service: Orthopedics;  Laterality: Right;  . CYSTOSCOPY WITH INSERTION OF UROLIFT N/A 03/15/2018   Procedure: CYSTOSCOPY WITH INSERTION OF UROLIFT;  Surgeon: Franchot Gallo, MD;  Location: [facility];  Service: Urology;  Laterality: N/A;  . SHOULDER SURGERY  01/05  . STUMP REVISION Right 12/15/2017   Procedure: REVISION RIGHT BELOW KNEE AMPUTATION;  Surgeon: [name], MD;  Location: [facility];  Service: Orthopedics;  Laterality: Right;    There were no vitals filed  for this visit.  Subjective Assessment - 07/11/18 1018    Subjective  No new complaitns. Using cane with quad tip in house mostly. No falls or pain to report.    Pertinent History  R TTA, DM2, cardiomyopathy, tachycardia, CHF, HTN, obesity, HLD, arthritis,     Limitations  Lifting;Standing;House hold activities;Walking    Patient Stated Goals  Go back to work part-time doing something, walk with prosthesis, get back where I used to be    Currently in Pain?  No/denies    Pain Score  0-No pain          [facility] Adult PT Treatment/Exercise - 07/11/18 1020      Transfers   Transfers  Sit to Stand;Stand to Sit    Sit to Stand  6: Modified independent (Device/Increase time)    Stand to Sit  6: Modified independent (Device/Increase time);With upper extremity assist      Ambulation/Gait   Ambulation/Gait  Yes    Ambulation/Gait Assistance  4: Min guard;5: Supervision    Ambulation/Gait Assistance Details  progressed from min guard to supervision on indoor level surfaces with cane/prosthesis.     Ambulation Distance (Feet)  230 Feet   x1 indoors, 500 x1 in/outdoors   Assistive device  Rolling walker;Prosthesis;Straight cane   cane with rubber quad tip   Gait Pattern  Step-through pattern;Decreased stride length;Trunk flexed;Decreased stance time -  right    Ambulation Surface  Level;Indoor;Unlevel;Outdoor;Paved      Prosthetics   Current prosthetic wear tolerance (days/week)   daily     Current prosthetic wear tolerance (#hours/day)   all awake hours, drying as needed    Residual limb condition   intact with no issues per pt report    Donning Prosthesis  Modified independent (device/increased time)    Doffing Prosthesis  Modified independent (device/increased time)          Balance Exercises - 07/11/18 1054      Balance Exercises: Standing   Standing Eyes Closed  Wide (BOA);Head turns;Foam/compliant surface;Other reps (comment);30 secs;Limitations    Rockerboard   Anterior/posterior;Lateral;EO;EC;Other time (comment);Other reps (comment)      Balance Exercises: Standing   Standing Eyes Closed Limitations  on airex without UE support:     Rebounder Limitations  performed both ways on balance board:          PT Short Term Goals - 06/29/18 1028      PT SHORT TERM GOAL #1   Title  Patient verbalizes proper cleaning & demonstrates proper donning of prosthesis. (All STGs Target Date: 07/05/2018)    Baseline  Per pt 06/29/2018    Time  1    Period  Months    Status  Achieved      PT SHORT TERM GOAL #2   Title  Patient tolerates prosthesis wear >12 hrs total / day without skin issues.     Baseline  Per pt: Has worn prosthesis 12 hrs for last 3 days    Time  1    Period  Months    Status  Achieved      PT SHORT TERM GOAL #3   Title  Patient ambulates 500' with RW & negotiates ramps / curbs with prosthesis modified independent.     Baseline  Patient was able to ambulate 520 ft, perform curb, and ramps mod I    Time  1    Period  Months    Status  Achieved      PT SHORT TERM GOAL #4   Title  Patient ambulates 200' with cane & prosthesis with minA.     Time  1    Period  Months    Status  On-going      PT SHORT TERM GOAL #5   Title  Patient able to pick up object from floor with supervision.     Baseline  Pt able to pick item off floor at supervision without use of UE/AD 06/29/2028    Time  1    Status  Achieved        PT Long Term Goals - 06/13/18 1334      PT LONG TERM GOAL #1   Title  Patient verbalizes & demonstrates proper prosthetic care to enable safe use of prosthesis. (All LTGs Target Date: 08/31/2018)    Time  3    Period  Months    Status  On-going    Target Date  08/31/18      PT LONG TERM GOAL #2   Title  Patient tolerates prosthesis wear >90% of awake hours without skin issues or limb pain to enable function throughout his day.     Time  3    Period  Months    Status  On-going    Target Date  08/31/18      PT LONG  TERM GOAL #3   Title  Berg Balance >45/56 to  indicate low fall risk.     Time  3    Period  Months    Status  On-going    Target Date  08/31/18      PT LONG TERM GOAL #4   Title  Patient ambulates 500' outdoors including grass, ramps & curbs with cane or less & prosthesis modified independent for community mobility.     Time  3    Period  Months    Status  On-going    Target Date  08/31/18      PT LONG TERM GOAL #5   Title  Patient able to perform lifting 25# boxes, carrying, pushing, pulling and climbing A-frame ladder to enable patient goals to return to yard work.     Time  3    Period  Months    Status  On-going    Target Date  08/31/18            Plan - 07/11/18 1020    Clinical Impression Statement  Today's skilled session continued to work on gait with cane/prosthesis on multiple surfaces without any significant issues. Also began to address balance with decreased UE support with pt having a preferance for left LE weight bearing. He needed cues/facilitation to shift onto prosthesis, this decreased as task progressed due to incr self weight shifting/increased awareness of need to weight shift. The pt is progressing toward goals and should benefit from continued PT to progress toward unmet goals.     Rehab Potential  Good    PT Frequency  2x / week    PT Duration  12 weeks    PT Treatment/Interventions  ADLs/Self Care Home Management;Canalith Repostioning;DME Instruction;Gait training;Stair training;Functional mobility training;Therapeutic activities;Therapeutic exercise;Balance training;Neuromuscular re-education;Patient/family education;Prosthetic Training;Manual techniques;Vestibular    PT Next Visit Plan  10 visit progress note due next visit- route to primary PT to complete; continue with gait training with cane (rubber quad tip)/prosthesis, continue to address balance/posture with decreased UE support.    Consulted and Agree with Plan of Care  Patient       Patient  will benefit from skilled therapeutic intervention in order to improve the following deficits and impairments:  Abnormal gait, Decreased activity tolerance, Decreased balance, Decreased endurance, Decreased knowledge of use of DME, Decreased mobility, Decreased strength, Decreased skin integrity, Dizziness, Impaired flexibility, Postural dysfunction, Prosthetic Dependency  Visit Diagnosis: Other abnormalities of gait and mobility  Unsteadiness on feet  Abnormal posture     Problem List Patient Active Problem List   Diagnosis Date Noted  . Dehiscence of amputation stump ([place])   . History of right below knee amputation ([place]) 11/08/2017  . Hypoalbuminemia due to protein-calorie malnutrition ([place])   . Urinary retention   . Acute blood loss anemia   . Status post below knee amputation, right ([place]) 10/20/2017  . Unilateral complete BKA ([place]) 10/20/2017  . Sepsis ([place]) 10/12/2017  . Diabetic foot ulcer ([place]) 10/12/2017  . Diabetic foot infection ([place]) 10/12/2017  . Hyponatremia 10/12/2017  . Chronic systolic CHF (congestive heart failure) ([place])   . Hyperkalemia 10/13/2015  . Congestive dilated cardiomyopathy ([place]) 07/13/2015  . Asymptomatic LV dysfunction 06/25/2015  . Poorly controlled diabetes mellitus ([facility]) 05/31/2015  . HTN (hypertension), benign 07/04/2014  . Viral cardiomyopathy, EF improved to 45-50% 04/02/2013  . Obesity 04/02/2013  . Diabetic foot ulcer with osteomyelitis ([place]) 04/02/2013  . Hyperlipidemia 04/02/2013  . Sinus tachycardia 04/02/2013    Willow Ora, PTA, CLT Outpatient Neuro [facility] [address], [address]  [place], [place] [address] [phone] 07/11/18, 1:45 PM   Name: KENDRIC SINDELAR MRN: [id number] Date of Birth: Jan 18, 1956

## 2018-07-12 ENCOUNTER — Other Ambulatory Visit: Payer: Self-pay | Admitting: Pharmacy Technician

## 2018-07-12 NOTE — Patient Outreach (Signed)
[facility]) Care Management  07/12/2018  Brian Herring 12/29/1955 [id number]    Received all initial signed paperwork and documents for [employer] patient assistance for [name] from both patient and provider.  Submitted completed application to [employer] via fax.  Will followup in 7-10 business days to inquire on status of application.  [name] P. Avenell Sellers, [place] Management [phone]

## 2018-07-13 ENCOUNTER — Ambulatory Visit: Payer: PPO

## 2018-07-13 DIAGNOSIS — Z89511 Acquired absence of right leg below knee: Secondary | ICD-10-CM

## 2018-07-13 DIAGNOSIS — R2689 Other abnormalities of gait and mobility: Secondary | ICD-10-CM | POA: Diagnosis not present

## 2018-07-13 DIAGNOSIS — R2681 Unsteadiness on feet: Secondary | ICD-10-CM

## 2018-07-13 DIAGNOSIS — M6281 Muscle weakness (generalized): Secondary | ICD-10-CM

## 2018-07-13 DIAGNOSIS — R293 Abnormal posture: Secondary | ICD-10-CM

## 2018-07-13 NOTE — Therapy (Addendum)
[facility] [address] [place], [place], [address] Phone: [phone]   Fax:  [phone]  Physical Therapy Treatment  Patient Details  Name: Brian Herring MRN: [id number] Date of Birth: [birth year] Referring Provider (PT): Milas Gain, [place]   Encounter Date: 07/13/2018   Progress Note Reporting Period 06/05/2018 to 07/13/2018  See note below for Objective Data and Assessment of Progress/Goals.   PT Short Term Goals - 07/23/18 0651      PT SHORT TERM GOAL #1   Title  Patient verbalizes adjusting ply socks to accomodate limb volume changes. (All updated STGs Target Date: 08/10/2018)    Status  New    Target Date  08/10/18      PT SHORT TERM GOAL #2   Title  Patient verbalizes prosthesis wear >90% of awake hours with PT guidance on skin issues or limb discomfort.     Status  New    Target Date  08/10/18      PT SHORT TERM GOAL #3   Title  Patient ambulates 500' outdoors on paved surfaces & grass with cane & prosthesis with supervision.     Status  New    Target Date  08/10/18      PT SHORT TERM GOAL #4   Title  Patient ambulates 100' with prosthesis only with minA    Status  New    Target Date  08/10/18      PT SHORT TERM GOAL #5   Title  Patient negotiates ramps & curbs with cane & prosthesis with supervision.     Status  New    Target Date  08/10/18         Jamey Reas, PT, DPT PT Specializing [facility] [place] 07/23/18 6:46 AM Phone:  [phone]  Fax:  [phone] [place], [place] [address]        PT End of Session - 07/13/18 1019    Visit Number  10    Number of Visits  25    Date for PT Re-Evaluation  09/03/18    Authorization Type  [employer] Medicare; $15 co-pay. $3400 oop & $160.49 applied pre-eval    PT Start Time  1018    PT Stop Time  1100    PT Time Calculation (min)  42 min    Equipment  Utilized During Treatment  Gait belt    Activity Tolerance  Patient tolerated treatment well    Behavior During Therapy  WFL for tasks assessed/performed       Past Medical History:  Diagnosis Date  . Arthritis    shoulder  . Cancer ([place])    skin cancer on nose  . Cardiomyopathy ([place])   . Chronic systolic CHF (congestive heart failure) ([place])   . Diabetes mellitus without complication ([place])   . Hyperlipidemia   . Hypertension     Past Surgical History:  Procedure Laterality Date  . AMPUTATION Right 10/14/2017   Procedure: AMPUTATION  GREAT TOE;  Surgeon: [name], MD;  Location: [facility];  Service: Orthopedics;  Laterality: Right;  . AMPUTATION Right 10/18/2017   Procedure: RIGHT BELOW KNEE AMPUTATION;  Surgeon: [name], MD;  Location: [facility];  Service: Orthopedics;  Laterality: Right;  . CYSTOSCOPY WITH INSERTION OF UROLIFT N/A 03/15/2018   Procedure: CYSTOSCOPY WITH INSERTION OF UROLIFT;  Surgeon: Franchot Gallo, MD;  [facility];  Service: Urology;  Laterality: N/A;  .  SHOULDER SURGERY  01/05  . STUMP REVISION Right 12/15/2017   Procedure: REVISION RIGHT BELOW KNEE AMPUTATION;  Surgeon: [name], MD;  Location: [facility];  Service: Orthopedics;  Laterality: Right;    There were no vitals filed for this visit.  Subjective Assessment - 07/13/18 1019    Subjective  No falls to report, HEP is going well. Pt reports going to grocery store to use buggy for UE support with no issues. Uses quad tipped cane in home.     Pertinent History  R TTA, DM2, cardiomyopathy, tachycardia, CHF, HTN, obesity, HLD, arthritis,     Limitations  Lifting;Standing;House hold activities;Walking    Patient Stated Goals  Go back to work part-time doing something, walk with prosthesis, get back where I used to be    Currently in Pain?  No/denies         [facility] Adult PT Treatment/Exercise - 07/13/18 1024      Transfers   Transfers  Sit to Stand;Stand to Sit    Sit to Stand  4: Min  assist    Sit to Stand Details (indicate cue type and reason)  Standing from armed chair with hands on lap pushing through BLE's to transfer into standing. Pt required min assist for slight boost into standing, VC's for proper foot placement, technique, and sequence.     Stand to Sit  4: Min guard    Stand to Sit Details  VC's for controlled descent      Ambulation/Gait   Ambulation/Gait  Yes    Ambulation/Gait Assistance  4: Min guard    Ambulation/Gait Assistance Details  One episode of LOB after 725ft with pt correction, minimal deviation, CGA for safety. Pt began to show some fatigue towards end of ambulation, VC's for breathing technique, posture and forward gaze.     Ambulation Distance (Feet)  1000 Feet    Assistive device  Straight cane;Prosthesis;Other (Comment)   with rubber quad tip   Gait Pattern  Step-through pattern;Decreased stride length;Trunk flexed;Decreased stance time - right    Ambulation Surface  Level;Unlevel;Indoor;Outdoor;Paved      High Level Balance   High Level Balance Activities  Side stepping;Tandem walking    High Level Balance Comments  At counter on blue foam beam side stepping with BUE progressing to SUE support, tandem fwds/bkwds with SUE support, CGA for safety with minimal instability during tandem.         PT Short Term Goals - 06/29/18 1028      PT SHORT TERM GOAL #1   Title  Patient verbalizes proper cleaning & demonstrates proper donning of prosthesis. (All STGs Target Date: 07/05/2018)    Baseline  Per pt 06/29/2018    Time  1    Period  Months    Status  Achieved      PT SHORT TERM GOAL #2   Title  Patient tolerates prosthesis wear >12 hrs total / day without skin issues.     Baseline  Per pt: Has worn prosthesis 12 hrs for last 3 days    Time  1    Period  Months    Status  Achieved      PT SHORT TERM GOAL #3   Title  Patient ambulates 500' with RW & negotiates ramps / curbs with prosthesis modified independent.     Baseline  Patient  was able to ambulate 520 ft, perform curb, and ramps mod I    Time  1    Period  Months    Status  Achieved      PT SHORT TERM GOAL #4   Title  Patient ambulates 200' with cane & prosthesis with minA.     Time  1    Period  Months    Status  On-going      PT SHORT TERM GOAL #5   Title  Patient able to pick up object from floor with supervision.     Baseline  Pt able to pick item off floor at supervision without use of UE/AD 06/29/2028    Time  1    Status  Achieved        PT Long Term Goals - 06/13/18 1334      PT LONG TERM GOAL #1   Title  Patient verbalizes & demonstrates proper prosthetic care to enable safe use of prosthesis. (All LTGs Target Date: 08/31/2018)    Time  3    Period  Months    Status  On-going    Target Date  08/31/18      PT LONG TERM GOAL #2   Title  Patient tolerates prosthesis wear >90% of awake hours without skin issues or limb pain to enable function throughout his day.     Time  3    Period  Months    Status  On-going    Target Date  08/31/18      PT LONG TERM GOAL #3   Title  Berg Balance >45/56 to indicate low fall risk.     Time  3    Period  Months    Status  On-going    Target Date  08/31/18      PT LONG TERM GOAL #4   Title  Patient ambulates 500' outdoors including grass, ramps & curbs with cane or less & prosthesis modified independent for community mobility.     Time  3    Period  Months    Status  On-going    Target Date  08/31/18      PT LONG TERM GOAL #5   Title  Patient able to perform lifting 25# boxes, carrying, pushing, pulling and climbing A-frame ladder to enable patient goals to return to yard work.     Time  3    Period  Months    Status  On-going    Target Date  08/31/18            Plan - 07/13/18 1110    Clinical Impression Statement  Todays skilled session focused on gait training with rubber quad tipped cane on outdoor surfaces for increased distance to improve stability and activty tolerance and High  level balance on blue foam beam while decreasing UE support. Pt presented with minimal fatigue towards end of ambulation and minimal instability. Pt is making progress and should benefit from PT sessions to continue to progress towards goals.     Rehab Potential  Good    PT Frequency  2x / week    PT Duration  12 weeks    PT Treatment/Interventions  ADLs/Self Care Home Management;Canalith Repostioning;DME Instruction;Gait training;Stair training;Functional mobility training;Therapeutic activities;Therapeutic exercise;Balance training;Neuromuscular re-education;Patient/family education;Prosthetic Training;Manual techniques;Vestibular    PT Next Visit Plan  continue with gait training with cane (rubber quad tip)/prosthesis, continue to address balance/posture with decreased UE support.    Consulted and Agree with Plan of Care  Patient       Patient will benefit from skilled therapeutic intervention in order to improve the following deficits and  impairments:  Abnormal gait, Decreased activity tolerance, Decreased balance, Decreased endurance, Decreased knowledge of use of DME, Decreased mobility, Decreased strength, Decreased skin integrity, Dizziness, Impaired flexibility, Postural dysfunction, Prosthetic Dependency  Visit Diagnosis: Other abnormalities of gait and mobility  Unsteadiness on feet  Abnormal posture  Muscle weakness (generalized)  Status post below knee amputation, right [facility])     Problem List Patient Active Problem List   Diagnosis Date Noted  . Dehiscence of amputation stump ([place])   . History of right below knee amputation ([place]) 11/08/2017  . Hypoalbuminemia due to protein-calorie malnutrition ([place])   . Urinary retention   . Acute blood loss anemia   . Status post below knee amputation, right ([place]) 10/20/2017  . Unilateral complete BKA ([place]) 10/20/2017  . Sepsis ([place]) 10/12/2017  . Diabetic foot ulcer ([place]) 10/12/2017  . Diabetic foot infection ([place]) 10/12/2017  .  Hyponatremia 10/12/2017  . Chronic systolic CHF (congestive heart failure) ([place])   . Hyperkalemia 10/13/2015  . Congestive dilated cardiomyopathy ([place]) 07/13/2015  . Asymptomatic LV dysfunction 06/25/2015  . Poorly controlled diabetes mellitus ([place]) 05/31/2015  . HTN (hypertension), benign 07/04/2014  . Viral cardiomyopathy, EF improved to 45-50% 04/02/2013  . Obesity 04/02/2013  . Diabetic foot ulcer with osteomyelitis ([place]) 04/02/2013  . Hyperlipidemia 04/02/2013  . Sinus tachycardia 04/02/2013   Maryiah Olvey, PTA  Nancey Kreitz A Daemien Fronczak 07/13/2018, 11:14 AM  [facility] [address] [place], [place], [address] Phone: [phone]   Fax:  [phone]  Name: DONNA SILVERMAN MRN: [id number] Date of Birth: [birth year]

## 2018-07-15 DIAGNOSIS — S88119A Complete traumatic amputation at level between knee and ankle, unspecified lower leg, initial encounter: Secondary | ICD-10-CM | POA: Diagnosis not present

## 2018-07-16 ENCOUNTER — Ambulatory Visit: Payer: PPO | Admitting: Rehabilitation

## 2018-07-20 ENCOUNTER — Ambulatory Visit: Payer: PPO

## 2018-07-20 DIAGNOSIS — Z89511 Acquired absence of right leg below knee: Secondary | ICD-10-CM

## 2018-07-20 DIAGNOSIS — M6281 Muscle weakness (generalized): Secondary | ICD-10-CM

## 2018-07-20 DIAGNOSIS — R2681 Unsteadiness on feet: Secondary | ICD-10-CM

## 2018-07-20 DIAGNOSIS — R2689 Other abnormalities of gait and mobility: Secondary | ICD-10-CM

## 2018-07-20 NOTE — Therapy (Signed)
[facility] [address] [place], [place], [address] Phone: [phone]   Fax:  [phone]  Physical Therapy Treatment  Patient Details  Name: Brian Herring MRN: [id number] Date of Birth: 28-Nov-1955 Referring Provider (PT): Milas Gain, [place]   Encounter Date: 07/20/2018  PT End of Session - 07/20/18 1111    Visit Number  11    Number of Visits  25    Date for PT Re-Evaluation  09/03/18    Authorization Type  [employer]; $15 co-pay. $3400 oop & $160.49 applied pre-eval    PT Start Time  1108    PT Stop Time  1153    PT Time Calculation (min)  45 min    Equipment Utilized During Treatment  Gait belt    Activity Tolerance  Patient tolerated treatment well    Behavior During Therapy  [facility] for tasks assessed/performed       Past Medical History:  Diagnosis Date  . Arthritis    shoulder  . Cancer ([place])    skin cancer on nose  . Cardiomyopathy ([place])   . Chronic systolic CHF (congestive heart failure) ([place])   . Diabetes mellitus without complication ([place])   . Hyperlipidemia   . Hypertension     Past Surgical History:  Procedure Laterality Date  . AMPUTATION Right 10/14/2017   Procedure: AMPUTATION  GREAT TOE;  Surgeon: [name], MD;  Location: [facility];  Service: Orthopedics;  Laterality: Right;  . AMPUTATION Right 10/18/2017   Procedure: RIGHT BELOW KNEE AMPUTATION;  Surgeon: [name], MD;  Location: [facility];  Service: Orthopedics;  Laterality: Right;  . CYSTOSCOPY WITH INSERTION OF UROLIFT N/A 03/15/2018   Procedure: CYSTOSCOPY WITH INSERTION OF UROLIFT;  Surgeon: Franchot Gallo, MD;  Location: [facility];  Service: Urology;  Laterality: N/A;  . SHOULDER SURGERY  01/05  . STUMP REVISION Right 12/15/2017   Procedure: REVISION RIGHT BELOW KNEE AMPUTATION;  Surgeon: [name], MD;  Location: [facility];  Service: Orthopedics;  Laterality: Right;    There were no vitals filed  for this visit.  Subjective Assessment - 07/20/18 1111    Subjective  No new complaints. No falls or pain to report.     Pertinent History  R TTA, DM2, cardiomyopathy, tachycardia, CHF, HTN, obesity, HLD, arthritis,     Limitations  Lifting;Standing;House hold activities;Walking    Patient Stated Goals  Go back to work part-time doing something, walk with prosthesis, get back where I used to be    Currently in Pain?  No/denies    Pain Score  0-No pain        [facility] Adult PT Treatment/Exercise - 07/20/18 1136      Ambulation/Gait   Ambulation/Gait  Yes    Ambulation/Gait Assistance  5: Supervision    Ambulation/Gait Assistance Details  No LOB noted during ambulation, less fatigue during this session with progression from min guard to Supervision.     Ambulation Distance (Feet)  1000 Feet    Assistive device  Straight cane;Prosthesis;Other (Comment)   with rubber quad tip   Gait Pattern  Step-through pattern;Decreased stride length;Trunk flexed;Decreased stance time - right    Ambulation Surface  Level;Unlevel;Indoor;Outdoor;Paved    Stairs  Yes    Stairs Assistance  4: Min guard    Stairs Assistance Details (indicate cue type and reason)  VC's for proper sequence with quad tipped cane.     Stair Management Technique  One rail Right;With cane;Step  to pattern;Forwards   L quad tipped cane   Number of Stairs  4   x3   Height of Stairs  6    Ramp  6: Modified independent (Device)    Curb  4: Min assist    Curb Details (indicate cue type and reason)  VC's for proper sequence of BLE's/quad tipped cane.      Neuro Re-ed    Neuro Re-ed Details   Pt negotiating around cones and over blue mat with no AD to increase balance reactions and decrease fall risk with no LOB noted, pt presented with narrow BOS and some crossover gait while negotiating around cones.         PT Short Term Goals - 06/29/18 1028      PT SHORT TERM GOAL #1   Title  Patient verbalizes proper cleaning & demonstrates  proper donning of prosthesis. (All STGs Target Date: 07/05/2018)    Baseline  Per pt 06/29/2018    Time  1    Period  Months    Status  Achieved      PT SHORT TERM GOAL #2   Title  Patient tolerates prosthesis wear >12 hrs total / day without skin issues.     Baseline  Per pt: Has worn prosthesis 12 hrs for last 3 days    Time  1    Period  Months    Status  Achieved      PT SHORT TERM GOAL #3   Title  Patient ambulates 500' with RW & negotiates ramps / curbs with prosthesis modified independent.     Baseline  Patient was able to ambulate 520 ft, perform curb, and ramps mod I    Time  1    Period  Months    Status  Achieved      PT SHORT TERM GOAL #4   Title  Patient ambulates 200' with cane & prosthesis with minA.     Time  1    Period  Months    Status  On-going      PT SHORT TERM GOAL #5   Title  Patient able to pick up object from floor with supervision.     Baseline  Pt able to pick item off floor at supervision without use of UE/AD 06/29/2028    Time  1    Status  Achieved        PT Long Term Goals - 06/13/18 1334      PT LONG TERM GOAL #1   Title  Patient verbalizes & demonstrates proper prosthetic care to enable safe use of prosthesis. (All LTGs Target Date: 08/31/2018)    Time  3    Period  Months    Status  On-going    Target Date  08/31/18      PT LONG TERM GOAL #2   Title  Patient tolerates prosthesis wear >90% of awake hours without skin issues or limb pain to enable function throughout his day.     Time  3    Period  Months    Status  On-going    Target Date  08/31/18      PT LONG TERM GOAL #3   Title  Berg Balance >45/56 to indicate low fall risk.     Time  3    Period  Months    Status  On-going    Target Date  08/31/18      PT LONG TERM GOAL #4   Title  Patient ambulates 500' outdoors including grass, ramps & curbs with cane or less & prosthesis modified independent for community mobility.     Time  3    Period  Months    Status  On-going     Target Date  08/31/18      PT LONG TERM GOAL #5   Title  Patient able to perform lifting 25# boxes, carrying, pushing, pulling and climbing A-frame ladder to enable patient goals to return to yard work.     Time  3    Period  Months    Status  On-going    Target Date  08/31/18            Plan - 07/20/18 1111    Clinical Impression Statement  Todays skilled session continued to focus on gait training with rubber quad tipped cane outdoors for 1000' with progression to Supervision for safety, no LOB and increase in activity tolerance, stair training with 1rail/quad tipped cane/prosthesis, ramp & curb with quad tipped cane, and high level balance while negotiating around obstacles/compliant surfaces.     Rehab Potential  Good    PT Frequency  2x / week    PT Duration  12 weeks    PT Treatment/Interventions  ADLs/Self Care Home Management;Canalith Repostioning;DME Instruction;Gait training;Stair training;Functional mobility training;Therapeutic activities;Therapeutic exercise;Balance training;Neuromuscular re-education;Patient/family education;Prosthetic Training;Manual techniques;Vestibular    PT Next Visit Plan  continue with gait training with cane (rubber quad tip)/prosthesis, continue to address balance/posture with decreased UE support.    Consulted and Agree with Plan of Care  Patient       Patient will benefit from skilled therapeutic intervention in order to improve the following deficits and impairments:  Abnormal gait, Decreased activity tolerance, Decreased balance, Decreased endurance, Decreased knowledge of use of DME, Decreased mobility, Decreased strength, Decreased skin integrity, Dizziness, Impaired flexibility, Postural dysfunction, Prosthetic Dependency  Visit Diagnosis: Other abnormalities of gait and mobility  Unsteadiness on feet  Status post below knee amputation, right (HCC)  Muscle weakness (generalized)     Problem List Patient Active Problem List    Diagnosis Date Noted  . Dehiscence of amputation stump ([place])   . History of right below knee amputation ([place]) 11/08/2017  . Hypoalbuminemia due to protein-calorie malnutrition ([place])   . Urinary retention   . Acute blood loss anemia   . Status post below knee amputation, right ([place]) 10/20/2017  . Unilateral complete BKA ([place]) 10/20/2017  . Sepsis ([place]) 10/12/2017  . Diabetic foot ulcer ([place]) 10/12/2017  . Diabetic foot infection ([place]) 10/12/2017  . Hyponatremia 10/12/2017  . Chronic systolic CHF (congestive heart failure) ([place])   . Hyperkalemia 10/13/2015  . Congestive dilated cardiomyopathy ([place]) 07/13/2015  . Asymptomatic LV dysfunction 06/25/2015  . Poorly controlled diabetes mellitus ([place]) 05/31/2015  . HTN (hypertension), benign 07/04/2014  . Viral cardiomyopathy, EF improved to 45-50% 04/02/2013  . Obesity 04/02/2013  . Diabetic foot ulcer with osteomyelitis ([place]) 04/02/2013  . Hyperlipidemia 04/02/2013  . Sinus tachycardia 04/02/2013    , PTA   A  07/20/2018, 12:29 PM  [facility] [address] [place], [place], [address] Phone: [phone]   Fax:  [phone]  Name: JARYN ROSKO MRN: [id number] Date of Birth: 03-Feb-1956

## 2018-07-23 ENCOUNTER — Other Ambulatory Visit: Payer: Self-pay | Admitting: Pharmacy Technician

## 2018-07-23 NOTE — Patient Outreach (Signed)
[facility]) Care Management  07/23/2018  Brian Herring 08/19/1955 [id number]    Care coordination calls place to two patient assistance programs; [employer] for [employer] and [employer] for [employer].  Spoke to [place] at [employer] who said the patient was DENIED because the inruance plan indicates they have coverage for this medication. Jeneen Rinks informed that they will need a copy for the PA letter whether approval or denial from [employer]. IF approval PA letter sent in then it will need to state an end date. Reached out to our contact at [employer] and a denial PA was submitted to [place] [facility] via fax.  Will followup with [employer] in 5-7 business days to confirm receipt of letter and to inquire if a determination has been made.  Spoke to [employer] at [employer] and no determination had been made yet on [place]. [place] suggest calling back in a few days.  Plan to followup with both plans within 5-7 business days.  Donyelle Enyeart P. Sarinah Doetsch, [facility] Management [phone]

## 2018-07-24 ENCOUNTER — Ambulatory Visit: Payer: PPO | Admitting: Physical Therapy

## 2018-07-24 ENCOUNTER — Encounter: Payer: Self-pay | Admitting: Physical Therapy

## 2018-07-24 DIAGNOSIS — M6281 Muscle weakness (generalized): Secondary | ICD-10-CM

## 2018-07-24 DIAGNOSIS — R2689 Other abnormalities of gait and mobility: Secondary | ICD-10-CM | POA: Diagnosis not present

## 2018-07-24 DIAGNOSIS — R2681 Unsteadiness on feet: Secondary | ICD-10-CM

## 2018-07-25 NOTE — Therapy (Signed)
[facility] [address] [place], [place], [address] Phone: [phone]   Fax:  [phone]  Physical Therapy Treatment  Patient Details  Name: Brian Herring MRN: [id number] Date of Birth: Aug 02, 1955 Referring Provider (PT): Milas Gain, [place]   Encounter Date: 07/24/2018   07/24/18 1106  PT Visits / Re-Eval  Visit Number 12  Number of Visits 25  Date for PT Re-Evaluation 09/03/18  Authorization  Authorization Type [employer] Medicare; $15 co-pay. $3400 oop & $160.49 applied pre-eval  PT Time Calculation  PT Start Time 1104  PT Stop Time 1145  PT Time Calculation (min) 41 min  PT - End of Session  Equipment Utilized During Treatment Gait belt  Activity Tolerance Patient tolerated treatment well  Behavior During Therapy [facility] for tasks assessed/performed     Past Medical History:  Diagnosis Date  . Arthritis    shoulder  . Cancer ([place])    skin cancer on nose  . Cardiomyopathy ([place])   . Chronic systolic CHF (congestive heart failure) ([place])   . Diabetes mellitus without complication ([place])   . Hyperlipidemia   . Hypertension     Past Surgical History:  Procedure Laterality Date  . AMPUTATION Right 10/14/2017   Procedure: AMPUTATION  GREAT TOE;  Surgeon: [name], MD;  Location: [facility];  Service: Orthopedics;  Laterality: Right;  . AMPUTATION Right 10/18/2017   Procedure: RIGHT BELOW KNEE AMPUTATION;  Surgeon: [name], MD;  Location: [facility];  Service: Orthopedics;  Laterality: Right;  . CYSTOSCOPY WITH INSERTION OF UROLIFT N/A 03/15/2018   Procedure: CYSTOSCOPY WITH INSERTION OF UROLIFT;  Surgeon: Franchot Gallo, MD;  [facility];  Service: Urology;  Laterality: N/A;  . SHOULDER SURGERY  01/05  . STUMP REVISION Right 12/15/2017   Procedure: REVISION RIGHT BELOW KNEE AMPUTATION;  Surgeon: [name], MD;  Location: [facility];  Service: Orthopedics;  Laterality: Right;     There were no vitals filed for this visit.     07/24/18 1106  Symptoms/Limitations  Subjective No new complaints. No falls or pain to report.   Pertinent History R TTA, DM2, cardiomyopathy, tachycardia, CHF, HTN, obesity, HLD, arthritis,   Limitations Lifting;Standing;House hold activities;Walking  Patient Stated Goals Go back to work part-time doing something, walk with prosthesis, get back where I used to be  Pain Assessment  Currently in Pain? No/denies  Pain Score 0      07/24/18 1107  Transfers  Transfers Sit to Stand;Stand to Sit  Sit to Stand 5: Supervision  Stand to Sit 5: Supervision  Ambulation/Gait  Ambulation/Gait Yes  Ambulation/Gait Assistance 5: Supervision  Ambulation/Gait Assistance Details pt had no balance issues with use of cane/prosthesis in session today  Ambulation Distance (Feet)  (around gym with activities)  Assistive device Straight cane;Prosthesis  Gait Pattern Step-through pattern;Decreased stride length;Trunk flexed;Decreased stance time - right  Ambulation Surface Level;Outdoor  High Level Balance  High Level Balance Activities Negotiating over obstacles  High Level Balance Comments with cane/prosthesis: fwd stepping over bolsters of varied heights with cues on correct sequencing x 6 laps, min guard to min assist for balance;   Therapeutic Activites   Therapeutic Activities Other Therapeutic Activities  Other Therapeutic Activities discussed working out at gym. educated on use of treadmill, including how to get on after starting/off with stopping with prosthesis;  Neuro Re-ed   Neuro Re-ed Details  for balance/NMR: gait along ~50 foot hallway with cane/prosthesis with head movements left<>right,  then up<>down x 2 laps each. min guard assist for balance with minor decrease in gait speed noted. no balance issues noted.   Prosthetics  Prosthetic Care Comments  edcuated on what to look for with changing shoes, effects of having a higher or lower  heel to toe depth.   Current prosthetic wear tolerance (days/week)  daily   Current prosthetic wear tolerance (#hours/day)  all awake hours, drying as needed  Residual limb condition  intact with no issues per pt report  Education Provided Residual limb care;Other (comment) (see prosthetic care comments)  Person(s) Educated Patient  Education Method Explanation;Demonstration;Verbal cues  Education Method Verbalized understanding;Returned demonstration;Verbal cues required  Donning Prosthesis 6  Doffing Prosthesis 6        PT Short Term Goals - 07/23/18 9833      PT SHORT TERM GOAL #1   Title  Patient verbalizes adjusting ply socks to accomodate limb volume changes. (All updated STGs Target Date: 08/10/2018)    Status  New    Target Date  08/10/18      PT SHORT TERM GOAL #2   Title  Patient verbalizes prosthesis wear >90% of awake hours with PT guidance on skin issues or limb discomfort.     Status  New    Target Date  08/10/18      PT SHORT TERM GOAL #3   Title  Patient ambulates 500' outdoors on paved surfaces & grass with cane & prosthesis with supervision.     Status  New    Target Date  08/10/18      PT SHORT TERM GOAL #4   Title  Patient ambulates 100' with prosthesis only with minA    Status  New    Target Date  08/10/18      PT SHORT TERM GOAL #5   Title  Patient negotiates ramps & curbs with cane & prosthesis with supervision.     Status  New    Target Date  08/10/18        PT Long Term Goals - 06/13/18 1334      PT LONG TERM GOAL #1   Title  Patient verbalizes & demonstrates proper prosthetic care to enable safe use of prosthesis. (All LTGs Target Date: 08/31/2018)    Time  3    Period  Months    Status  On-going    Target Date  08/31/18      PT LONG TERM GOAL #2   Title  Patient tolerates prosthesis wear >90% of awake hours without skin issues or limb pain to enable function throughout his day.     Time  3    Period  Months    Status  On-going     Target Date  08/31/18      PT LONG TERM GOAL #3   Title  Berg Balance >45/56 to indicate low fall risk.     Time  3    Period  Months    Status  On-going    Target Date  08/31/18      PT LONG TERM GOAL #4   Title  Patient ambulates 500' outdoors including grass, ramps & curbs with cane or less & prosthesis modified independent for community mobility.     Time  3    Period  Months    Status  On-going    Target Date  08/31/18      PT LONG TERM GOAL #5   Title  Patient able to perform lifting  25# boxes, carrying, pushing, pulling and climbing A-frame ladder to enable patient goals to return to yard work.     Time  3    Period  Months    Status  On-going    Target Date  08/31/18          07/24/18 1106  Plan  Clinical Impression Statement Today's skilled session continued to address use of cane/prosthesis and balance reactions. The pt is progressing well toward goals and should benefit from continued PT to progress toward unmet goals.   Pt will benefit from skilled therapeutic intervention in order to improve on the following deficits Abnormal gait;Decreased activity tolerance;Decreased balance;Decreased endurance;Decreased knowledge of use of DME;Decreased mobility;Decreased strength;Decreased skin integrity;Dizziness;Impaired flexibility;Postural dysfunction;Prosthetic Dependency  Rehab Potential Good  PT Frequency 2x / week  PT Duration 12 weeks  PT Treatment/Interventions ADLs/Self Care Home Management;Canalith Repostioning;DME Instruction;Gait training;Stair training;Functional mobility training;Therapeutic activities;Therapeutic exercise;Balance training;Neuromuscular re-education;Patient/family education;Prosthetic Training;Manual techniques;Vestibular  PT Next Visit Plan continue with gait training with cane (rubber quad tip)/prosthesis, gait for short distances with prosthesis only; continue to address balance/posture with decreased UE support.  Consulted and Agree with Plan of  Care Patient         Patient will benefit from skilled therapeutic intervention in order to improve the following deficits and impairments:  Abnormal gait, Decreased activity tolerance, Decreased balance, Decreased endurance, Decreased knowledge of use of DME, Decreased mobility, Decreased strength, Decreased skin integrity, Dizziness, Impaired flexibility, Postural dysfunction, Prosthetic Dependency  Visit Diagnosis: Other abnormalities of gait and mobility  Unsteadiness on feet  Muscle weakness (generalized)     Problem List Patient Active Problem List   Diagnosis Date Noted  . Dehiscence of amputation stump ([place])   . History of right below knee amputation ([place]) 11/08/2017  . Hypoalbuminemia due to protein-calorie malnutrition ([place])   . Urinary retention   . Acute blood loss anemia   . Status post below knee amputation, right ([place]) 10/20/2017  . Unilateral complete BKA ([place]) 10/20/2017  . Sepsis ([place]) 10/12/2017  . Diabetic foot ulcer ([place]) 10/12/2017  . Diabetic foot infection ([place]) 10/12/2017  . Hyponatremia 10/12/2017  . Chronic systolic CHF (congestive heart failure) ([place])   . Hyperkalemia 10/13/2015  . Congestive dilated cardiomyopathy ([place]) 07/13/2015  . Asymptomatic LV dysfunction 06/25/2015  . Poorly controlled diabetes mellitus ([place]) 05/31/2015  . HTN (hypertension), benign 07/04/2014  . Viral cardiomyopathy, EF improved to 45-50% 04/02/2013  . Obesity 04/02/2013  . Diabetic foot ulcer with osteomyelitis ([place]) 04/02/2013  . Hyperlipidemia 04/02/2013  . Sinus tachycardia 04/02/2013    Willow Ora, PTA, [facility] [address], [place], [place] [address] [phone] 07/25/18, 8:54 PM   Name: BOSCO PAPARELLA MRN: [id number] Date of Birth: [birth year]

## 2018-07-27 ENCOUNTER — Ambulatory Visit: Payer: PPO | Attending: Physician Assistant

## 2018-07-27 DIAGNOSIS — R293 Abnormal posture: Secondary | ICD-10-CM | POA: Insufficient documentation

## 2018-07-27 DIAGNOSIS — M6281 Muscle weakness (generalized): Secondary | ICD-10-CM | POA: Insufficient documentation

## 2018-07-27 DIAGNOSIS — R2681 Unsteadiness on feet: Secondary | ICD-10-CM | POA: Diagnosis not present

## 2018-07-27 DIAGNOSIS — Z89511 Acquired absence of right leg below knee: Secondary | ICD-10-CM | POA: Insufficient documentation

## 2018-07-27 DIAGNOSIS — R2689 Other abnormalities of gait and mobility: Secondary | ICD-10-CM | POA: Diagnosis not present

## 2018-07-27 NOTE — Therapy (Signed)
[facility] [address] [place], [place], [address] Phone: [phone]   Fax:  [phone]  Physical Therapy Treatment  Patient Details  Name: Brian Herring MRN: [id number] Date of Birth: Apr 30, 1956 Referring Provider (PT): Milas Gain, [place]   Encounter Date: 07/27/2018  PT End of Session - 07/27/18 1107    Visit Number  13    Number of Visits  25    Date for PT Re-Evaluation  09/03/18    Authorization Type  [employer]; $15 co-pay. $3400 oop & $160.49 applied pre-eval    PT Start Time  1104    PT Stop Time  1145    PT Time Calculation (min)  41 min    Equipment Utilized During Treatment  Gait belt    Activity Tolerance  Patient tolerated treatment well    Behavior During Therapy  [facility] for tasks assessed/performed       Past Medical History:  Diagnosis Date  . Arthritis    shoulder  . Cancer ([place])    skin cancer on nose  . Cardiomyopathy ([place])   . Chronic systolic CHF (congestive heart failure) ([place])   . Diabetes mellitus without complication ([place])   . Hyperlipidemia   . Hypertension     Past Surgical History:  Procedure Laterality Date  . AMPUTATION Right 10/14/2017   Procedure: AMPUTATION  GREAT TOE;  Surgeon: [name], MD;  Location: [facility];  Service: Orthopedics;  Laterality: Right;  . AMPUTATION Right 10/18/2017   Procedure: RIGHT BELOW KNEE AMPUTATION;  Surgeon: [name], MD;  Location: [facility];  Service: Orthopedics;  Laterality: Right;  . CYSTOSCOPY WITH INSERTION OF UROLIFT N/A 03/15/2018   Procedure: CYSTOSCOPY WITH INSERTION OF UROLIFT;  Surgeon: Franchot Gallo, MD;  [facility];  Service: Urology;  Laterality: N/A;  . SHOULDER SURGERY  01/05  . STUMP REVISION Right 12/15/2017   Procedure: REVISION RIGHT BELOW KNEE AMPUTATION;  Surgeon: [name], MD;  Location: [facility];  Service: Orthopedics;  Laterality: Right;    There were no vitals filed for  this visit.  Subjective Assessment - 07/27/18 1106    Subjective  No new falls, had leg adjusted with padding added & quarter of an inch to the pipe, pt states it feels really good right now.     Pertinent History  R TTA, DM2, cardiomyopathy, tachycardia, CHF, HTN, obesity, HLD, arthritis,     Limitations  Lifting;Standing;House hold activities;Walking    Patient Stated Goals  Go back to work part-time doing something, walk with prosthesis, get back where I used to be    Currently in Pain?  No/denies        [facility] Adult PT Treatment/Exercise - 07/27/18 1108      Ambulation/Gait   Ambulation/Gait  Yes    Ambulation/Gait Assistance  5: Supervision    Ambulation/Gait Assistance Details  No balance issues during outdoor gait training, pt performing head nods/turns while ambulating with no AD indoors with min/mod LOB noted, crossover gait at times with pt able to stabilize.     Ambulation Distance (Feet)  1000 Feet   1000 outdoors with AD, 230 indoors with no AD   Assistive device  Straight cane;Prosthesis    Gait Pattern  Step-through pattern;Decreased stride length;Trunk flexed;Decreased stance time - right    Ambulation Surface  Level;Unlevel;Indoor;Outdoor;Paved    Curb  6: Modified independent (Device/increase time)    Curb Details (indicate cue type and reason)  On  outdoor curb with cane, no LOB noted.       Exercises   Exercises  Knee/Hip      Knee/Hip Exercises: Aerobic   Nustep  L4, 62mins         PT Short Term Goals - 07/23/18 0354      PT SHORT TERM GOAL #1   Title  Patient verbalizes adjusting ply socks to accomodate limb volume changes. (All updated STGs Target Date: 08/10/2018)    Status  New    Target Date  08/10/18      PT SHORT TERM GOAL #2   Title  Patient verbalizes prosthesis wear >90% of awake hours with PT guidance on skin issues or limb discomfort.     Status  New    Target Date  08/10/18      PT SHORT TERM GOAL #3   Title  Patient ambulates 500'  outdoors on paved surfaces & grass with cane & prosthesis with supervision.     Status  New    Target Date  08/10/18      PT SHORT TERM GOAL #4   Title  Patient ambulates 100' with prosthesis only with minA    Status  New    Target Date  08/10/18      PT SHORT TERM GOAL #5   Title  Patient negotiates ramps & curbs with cane & prosthesis with supervision.     Status  New    Target Date  08/10/18        PT Long Term Goals - 06/13/18 1334      PT LONG TERM GOAL #1   Title  Patient verbalizes & demonstrates proper prosthetic care to enable safe use of prosthesis. (All LTGs Target Date: 08/31/2018)    Time  3    Period  Months    Status  On-going    Target Date  08/31/18      PT LONG TERM GOAL #2   Title  Patient tolerates prosthesis wear >90% of awake hours without skin issues or limb pain to enable function throughout his day.     Time  3    Period  Months    Status  On-going    Target Date  08/31/18      PT LONG TERM GOAL #3   Title  Berg Balance >45/56 to indicate low fall risk.     Time  3    Period  Months    Status  On-going    Target Date  08/31/18      PT LONG TERM GOAL #4   Title  Patient ambulates 500' outdoors including grass, ramps & curbs with cane or less & prosthesis modified independent for community mobility.     Time  3    Period  Months    Status  On-going    Target Date  08/31/18      PT LONG TERM GOAL #5   Title  Patient able to perform lifting 25# boxes, carrying, pushing, pulling and climbing A-frame ladder to enable patient goals to return to yard work.     Time  3    Period  Months    Status  On-going    Target Date  08/31/18       Plan - 07/27/18 1313    Clinical Impression Statement  Todays skilled session focused on gait training outdoors with cane/prosthesis, negotiaing pavement, ramps & curbs with Supervision/Mod I, gait indoor with no AD while performing head turns/nod  with some LOB noted, and Nustep for BLE strengthening. Pt is making  steady progress and should benefit from continued PT sessions to progress towards goals.     Rehab Potential  Good    PT Frequency  2x / week    PT Duration  12 weeks    PT Treatment/Interventions  ADLs/Self Care Home Management;Canalith Repostioning;DME Instruction;Gait training;Stair training;Functional mobility training;Therapeutic activities;Therapeutic exercise;Balance training;Neuromuscular re-education;Patient/family education;Prosthetic Training;Manual techniques;Vestibular    PT Next Visit Plan  continue with gait training with cane (rubber quad tip)/prosthesis, gait for short distances with prosthesis only; continue to address balance/posture with decreased UE support.    Consulted and Agree with Plan of Care  Patient       Patient will benefit from skilled therapeutic intervention in order to improve the following deficits and impairments:  Abnormal gait, Decreased activity tolerance, Decreased balance, Decreased endurance, Decreased knowledge of use of DME, Decreased mobility, Decreased strength, Decreased skin integrity, Dizziness, Impaired flexibility, Postural dysfunction, Prosthetic Dependency  Visit Diagnosis: Other abnormalities of gait and mobility  Unsteadiness on feet  Muscle weakness (generalized)     Problem List Patient Active Problem List   Diagnosis Date Noted  . Dehiscence of amputation stump ([place])   . History of right below knee amputation ([place]) 11/08/2017  . Hypoalbuminemia due to protein-calorie malnutrition ([place])   . Urinary retention   . Acute blood loss anemia   . Status post below knee amputation, right ([place]) 10/20/2017  . Unilateral complete BKA ([place]) 10/20/2017  . Sepsis ([place]) 10/12/2017  . Diabetic foot ulcer ([place]) 10/12/2017  . Diabetic foot infection ([place]) 10/12/2017  . Hyponatremia 10/12/2017  . Chronic systolic CHF (congestive heart failure) ([place])   . Hyperkalemia 10/13/2015  . Congestive dilated cardiomyopathy ([place]) 07/13/2015  .  Asymptomatic LV dysfunction 06/25/2015  . Poorly controlled diabetes mellitus ([place]) 05/31/2015  . HTN (hypertension), benign 07/04/2014  . Viral cardiomyopathy, EF improved to 45-50% 04/02/2013  . Obesity 04/02/2013  . Diabetic foot ulcer with osteomyelitis ([place]) 04/02/2013  . Hyperlipidemia 04/02/2013  . Sinus tachycardia 04/02/2013   Chassity Felts, PTA  Chassity A Felts 07/27/2018, 1:16 PM  [facility] [address] [place], [place], [address] Phone: [phone]   Fax:  [phone]  Name: GORDAN GRELL MRN: [id number] Date of Birth: April 11, 1956

## 2018-07-30 DIAGNOSIS — E113412 Type 2 diabetes mellitus with severe nonproliferative diabetic retinopathy with macular edema, left eye: Secondary | ICD-10-CM | POA: Diagnosis not present

## 2018-07-30 DIAGNOSIS — E113411 Type 2 diabetes mellitus with severe nonproliferative diabetic retinopathy with macular edema, right eye: Secondary | ICD-10-CM | POA: Diagnosis not present

## 2018-08-01 ENCOUNTER — Ambulatory Visit: Payer: PPO | Admitting: Physical Therapy

## 2018-08-01 ENCOUNTER — Other Ambulatory Visit: Payer: Self-pay | Admitting: Pharmacy Technician

## 2018-08-01 ENCOUNTER — Encounter: Payer: Self-pay | Admitting: Physical Therapy

## 2018-08-01 DIAGNOSIS — R2689 Other abnormalities of gait and mobility: Secondary | ICD-10-CM

## 2018-08-01 DIAGNOSIS — R2681 Unsteadiness on feet: Secondary | ICD-10-CM

## 2018-08-01 DIAGNOSIS — M6281 Muscle weakness (generalized): Secondary | ICD-10-CM

## 2018-08-01 DIAGNOSIS — R293 Abnormal posture: Secondary | ICD-10-CM

## 2018-08-01 NOTE — Patient Outreach (Signed)
[facility]) Care Management  08/01/2018  Brian Herring August 06, 1955 [id number]   Unsuccessful outreach call placed to patient in regards to his [place] application through [place] patient assistance.  Called to inform patient that he needed to contact [employer] to set up shipment of his medication but unfortunately patient did not answer the phone. HIPPA compliant voicemail left.  Will followup with patient in 3-5 business days if call is not returned.  Brian Herring, [place] Management [phone]

## 2018-08-01 NOTE — Therapy (Signed)
[facility] [address] [place], [place], [address] Phone: [phone]   Fax:  [phone]  Physical Therapy Treatment  Patient Details  Name: Brian Herring MRN: [id number] Date of Birth: [birth year] Referring Provider (PT): Milas Gain, [place]   Encounter Date: 08/01/2018    Past Medical History:  Diagnosis Date  . Arthritis    shoulder  . Cancer ([place])    skin cancer on nose  . Cardiomyopathy ([place])   . Chronic systolic CHF (congestive heart failure) ([place])   . Diabetes mellitus without complication ([place])   . Hyperlipidemia   . Hypertension     Past Surgical History:  Procedure Laterality Date  . AMPUTATION Right 10/14/2017   Procedure: AMPUTATION  GREAT TOE;  Surgeon: [name], MD;  Location: [facility];  Service: Orthopedics;  Laterality: Right;  . AMPUTATION Right 10/18/2017   Procedure: RIGHT BELOW KNEE AMPUTATION;  Surgeon: [name], MD;  Location: [facility];  Service: Orthopedics;  Laterality: Right;  . CYSTOSCOPY WITH INSERTION OF UROLIFT N/A 03/15/2018   Procedure: CYSTOSCOPY WITH INSERTION OF UROLIFT;  Surgeon: Franchot Gallo, MD;  [facility];  Service: Urology;  Laterality: N/A;  . SHOULDER SURGERY  01/05  . STUMP REVISION Right 12/15/2017   Procedure: REVISION RIGHT BELOW KNEE AMPUTATION;  Surgeon: [name], MD;  Location: [facility];  Service: Orthopedics;  Laterality: Right;    There were no vitals filed for this visit.  Subjective Assessment - 08/01/18 1021    Subjective  He plans to join [employer] or other gym and wants instruction in what he can do. He is wearing prosthesis all awake hours. He has been walking around house without device and in community with cane.     Pertinent History  R TTA, DM2, cardiomyopathy, tachycardia, CHF, HTN, obesity, HLD, arthritis,     Limitations  Lifting;Standing;House hold activities;Walking    Patient Stated Goals  Go back to work  part-time doing something, walk with prosthesis, get back where I used to be    Currently in Pain?  No/denies      Therapeutic Exercise PT instructed in safety & technique with treadmill use. Pt ambulated 2.40mph for 5 minutes with cues on step length. PT instructed in design of recumbent (seated with back support) that uses both UEs & LEs to operate is ideal cardio design. PT instructed in use of weight machines with symmetry of LEs positioning. PT recommends 4-6 LE & 4-6 UE machines that work antagonistic muscle groups. PT instructed with pt performing Leg press 50#, 70# too light, 100# proper wt & 120# too heavy. PT recommend 10-15 reps ea machine with 1 set initially and progressing to 2-3 sets after he determines how he responds to weight training.  Also not doing cardio & weight training on same day initially.  PT printed class schedule for [facility] and address for new center planning to open on [place] town where he lives. Pt verbalized understanding PT also gave handout / brochure for [employer] [facility] [place].                            PT Short Term Goals - 07/23/18 0651      PT SHORT TERM GOAL #1   Title  Patient verbalizes adjusting ply socks to accomodate limb volume changes. (All updated STGs Target Date: 08/10/2018)    Status  New  Target Date  08/10/18      PT SHORT TERM GOAL #2   Title  Patient verbalizes prosthesis wear >90% of awake hours with PT guidance on skin issues or limb discomfort.     Status  New    Target Date  08/10/18      PT SHORT TERM GOAL #3   Title  Patient ambulates 500' outdoors on paved surfaces & grass with cane & prosthesis with supervision.     Status  New    Target Date  08/10/18      PT SHORT TERM GOAL #4   Title  Patient ambulates 100' with prosthesis only with minA    Status  New    Target Date  08/10/18      PT SHORT TERM GOAL #5   Title  Patient negotiates ramps & curbs with cane &  prosthesis with supervision.     Status  New    Target Date  08/10/18        PT Long Term Goals - 06/13/18 1334      PT LONG TERM GOAL #1   Title  Patient verbalizes & demonstrates proper prosthetic care to enable safe use of prosthesis. (All LTGs Target Date: 08/31/2018)    Time  3    Period  Months    Status  On-going    Target Date  08/31/18      PT LONG TERM GOAL #2   Title  Patient tolerates prosthesis wear >90% of awake hours without skin issues or limb pain to enable function throughout his day.     Time  3    Period  Months    Status  On-going    Target Date  08/31/18      PT LONG TERM GOAL #3   Title  Berg Balance >45/56 to indicate low fall risk.     Time  3    Period  Months    Status  On-going    Target Date  08/31/18      PT LONG TERM GOAL #4   Title  Patient ambulates 500' outdoors including grass, ramps & curbs with cane or less & prosthesis modified independent for community mobility.     Time  3    Period  Months    Status  On-going    Target Date  08/31/18      PT LONG TERM GOAL #5   Title  Patient able to perform lifting 25# boxes, carrying, pushing, pulling and climbing A-frame ladder to enable patient goals to return to yard work.     Time  3    Period  Months    Status  On-going    Target Date  08/31/18            Plan - 08/01/18 2238    Clinical Impression Statement  Patient appears to understand how to use treadmill & weight machines with plan to join gym. He is going to check out [employer] to determine which equipment they have.     Rehab Potential  Good    PT Frequency  2x / week    PT Duration  12 weeks    PT Treatment/Interventions  ADLs/Self Care Home Management;Canalith Repostioning;DME Instruction;Gait training;Stair training;Functional mobility training;Therapeutic activities;Therapeutic exercise;Balance training;Neuromuscular re-education;Patient/family education;Prosthetic Training;Manual techniques;Vestibular     PT Next Visit Plan  check STGs, check if he joined gym & how things are going    Consulted and Agree with  Plan of Care  Patient       Patient will benefit from skilled therapeutic intervention in order to improve the following deficits and impairments:  Abnormal gait, Decreased activity tolerance, Decreased balance, Decreased endurance, Decreased knowledge of use of DME, Decreased mobility, Decreased strength, Decreased skin integrity, Dizziness, Impaired flexibility, Postural dysfunction, Prosthetic Dependency  Visit Diagnosis: Other abnormalities of gait and mobility  Unsteadiness on feet  Muscle weakness (generalized)  Abnormal posture     Problem List Patient Active Problem List   Diagnosis Date Noted  . Dehiscence of amputation stump ([place])   . History of right below knee amputation ([place]) 11/08/2017  . Hypoalbuminemia due to protein-calorie malnutrition ([place])   . Urinary retention   . Acute blood loss anemia   . Status post below knee amputation, right ([place]) 10/20/2017  . Unilateral complete BKA ([place]) 10/20/2017  . Sepsis ([place]) 10/12/2017  . Diabetic foot ulcer ([place]) 10/12/2017  . Diabetic foot infection ([place]) 10/12/2017  . Hyponatremia 10/12/2017  . Chronic systolic CHF (congestive heart failure) ([place])   . Hyperkalemia 10/13/2015  . Congestive dilated cardiomyopathy ([place]) 07/13/2015  . Asymptomatic LV dysfunction 06/25/2015  . Poorly controlled diabetes mellitus ([place]) 05/31/2015  . HTN (hypertension), benign 07/04/2014  . Viral cardiomyopathy, EF improved to 45-50% 04/02/2013  . Obesity 04/02/2013  . Diabetic foot ulcer with osteomyelitis ([place]) 04/02/2013  . Hyperlipidemia 04/02/2013  . Sinus tachycardia 04/02/2013    Sherhonda Gaspar PT, DPT 08/01/2018, 10:41 PM  [facility] [address] [place], [place], [address] Phone: [phone]   Fax:  [phone]  Name: Brian Herring MRN: [id number] Date  of Birth: [birth year]

## 2018-08-01 NOTE — Patient Outreach (Signed)
[facility]) Care Management  08/01/2018  Brian Herring 1955/10/29 [id number]    Care coordination calls placed to [place] patient assistance for [name] and [employer] patient assistance for [place].  Spoke to [place] at [employer] and patient has been APPROVED from 07/31/2018-07/25/2019. Lattie Haw said patient would need to contact [employer] to set up shipment.  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 Alex informed that once that has been received then a determination could be made. Alex informed that the patient was due for a refill now and that even though his current enrollment ended 07/24/18 that they would be able to do a "gratuitous" fill so that the patient does not run out of medication. Alex informed that the patient would have to call in for the refill.  Successful outreach call placed to Brian Herring. HIPPA identifiers verified.   Informed Brian Herring of the approval news for [employer] as well as the phone number for him to call to set up shipment. Brian Herring verbalized understanding.  Informed Brian Herring that his [employer] application was still in progress but that if he needed a refill then he could call into [employer] and request it. Supplied Brian Herring with the phone number to [employer]. Patient verbalized understanding and said he would call today as he is running low on medication.  Will followup with patient in 10-14 business days to confirm receipt of medication.  Taniesha Glanz P. Tahni Porchia, [place] Management [phone]

## 2018-08-01 NOTE — Patient Outreach (Signed)
[facility]) Care Management  08/01/2018  Brian Herring [birth year] [id number]   Care coordination call placed to [place] patient assistance in regards to [name] application.  Spoke to [place] who said the patient was enrolled in the program and APPROVED from 07/31/2018/-07/25/2019. She informed that patient would need to contact them to set up shipment.  Will followup with patient to make him aware.  Foch Rosenwald P. Naje Rice, [facility] Management [phone]

## 2018-08-03 ENCOUNTER — Encounter: Payer: Self-pay | Admitting: Physical Therapy

## 2018-08-03 ENCOUNTER — Ambulatory Visit: Payer: PPO | Admitting: Physical Therapy

## 2018-08-03 DIAGNOSIS — R2681 Unsteadiness on feet: Secondary | ICD-10-CM

## 2018-08-03 DIAGNOSIS — M6281 Muscle weakness (generalized): Secondary | ICD-10-CM

## 2018-08-03 DIAGNOSIS — R2689 Other abnormalities of gait and mobility: Secondary | ICD-10-CM

## 2018-08-03 NOTE — Therapy (Signed)
[facility] [address] [place], [place], [address] Phone: [phone]   Fax:  [phone]  Physical Therapy Treatment  Patient Details  Name: Brian Herring MRN: [id number] Date of Birth: July 07, 1956 Referring Provider (PT): Milas Gain, [place]   Encounter Date: 08/03/2018   08/03/18 1028  PT Visits / Re-Eval  Visit Number 14  Number of Visits 25  Date for PT Re-Evaluation 09/03/18  Authorization  Authorization Type [employer]; $15 co-pay. $3400 oop & $160.49 applied pre-eval  PT Time Calculation  PT Start Time 1020  PT Stop Time 1100  PT Time Calculation (min) 40 min  PT - End of Session  Equipment Utilized During Treatment Gait belt  Activity Tolerance Patient tolerated treatment well  Behavior During Therapy [facility] for tasks assessed/performed     Past Medical History:  Diagnosis Date  . Arthritis    shoulder  . Cancer ([place])    skin cancer on nose  . Cardiomyopathy ([place])   . Chronic systolic CHF (congestive heart failure) ([place])   . Diabetes mellitus without complication ([place])   . Hyperlipidemia   . Hypertension     Past Surgical History:  Procedure Laterality Date  . AMPUTATION Right 10/14/2017   Procedure: AMPUTATION  GREAT TOE;  Surgeon: [name], MD;  Location: [facility];  Service: Orthopedics;  Laterality: Right;  . AMPUTATION Right 10/18/2017   Procedure: RIGHT BELOW KNEE AMPUTATION;  Surgeon: [name], MD;  Location: [facility];  Service: Orthopedics;  Laterality: Right;  . CYSTOSCOPY WITH INSERTION OF UROLIFT N/A 03/15/2018   Procedure: CYSTOSCOPY WITH INSERTION OF UROLIFT;  Surgeon: Franchot Gallo, MD;  [facility];  Service: Urology;  Laterality: N/A;  . SHOULDER SURGERY  01/05  . STUMP REVISION Right 12/15/2017   Procedure: REVISION RIGHT BELOW KNEE AMPUTATION;  Surgeon: [name], MD;  Location: [facility];  Service: Orthopedics;  Laterality: Right;     There were no vitals filed for this visit.     08/03/18 2146  Symptoms/Limitations  Subjective No new complaints. No falls or pain to report.   Pertinent History R TTA, DM2, cardiomyopathy, tachycardia, CHF, HTN, obesity, HLD, arthritis,   Limitations Lifting;Standing;House hold activities;Walking  Patient Stated Goals Go back to work part-time doing something, walk with prosthesis, get back where I used to be  Pain Assessment  Currently in Pain? No/denies  Pain Score 0       [facility] Adult PT Treatment/Exercise - 08/03/18 1024      Transfers   Transfers  Sit to Stand;Stand to Sit    Sit to Stand  6: Modified independent (Device/Increase time);With upper extremity assist;From chair/3-in-1    Stand to Sit  6: Modified independent (Device/Increase time);With upper extremity assist;To chair/3-in-1      Ambulation/Gait   Ambulation/Gait  Yes    Ambulation/Gait Assistance  5: Supervision    Ambulation Distance (Feet)  500 Feet   x1 with 360 x1 no AD, 500 x1 no AD, + around gym   Assistive device  Straight cane;Prosthesis    Gait Pattern  Step-through pattern;Decreased stride length;Trunk flexed;Decreased stance time - right    Ambulation Surface  Level;Unlevel;Indoor;Outdoor;Paved    Ramp  6: Modified independent (Device);Other (comment)   min guard   Ramp Details (indicate cue type and reason)  Mod I with cane/prosthesis; min guard x2 reps with prostheisis only    Curb  6: Modified independent (Device/increase time);Other (comment)   min guard  Curb Details (indicate cue type and reason)  Mod I with cane/prosthesis; min guard assist with prosthesis only with pt turning sideways to descend/forward to ascend       Prosthetics   Current prosthetic wear tolerance (days/week)   daily     Current prosthetic wear tolerance (#hours/day)   all awake hours, drying as needed    Residual limb condition   intact with no issues per pt report    Donning Prosthesis  Modified independent  (device/increased time)    Doffing Prosthesis  Modified independent (device/increased time)               PT Short Term Goals - 08/03/18 1029      PT SHORT TERM GOAL #1   Title  Patient verbalizes adjusting ply socks to accomodate limb volume changes. (All updated STGs Target Date: 08/10/2018)    Baseline  08/03/18: met to date    Status  Achieved      PT SHORT TERM GOAL #2   Title  Patient verbalizes prosthesis wear >90% of awake hours with PT guidance on skin issues or limb discomfort.     Baseline  08/03/18: all awake hours    Status  Achieved      PT SHORT TERM GOAL #3   Title  Patient ambulates 500' outdoors on paved surfaces & grass with cane & prosthesis with supervision.     Baseline  08/03/18: met today    Status  Achieved      PT SHORT TERM GOAL #4   Title  Patient ambulates 100' with prosthesis only with minA    Baseline  08/03/18: met today    Status  Achieved      PT SHORT TERM GOAL #5   Title  Patient negotiates ramps & curbs with cane & prosthesis with supervision.     Baseline  08/03/18: met today    Status  Achieved        PT Long Term Goals - 08/03/18 1056      PT LONG TERM GOAL #1   Title  Patient verbalizes & demonstrates proper prosthetic care to enable safe use of prosthesis. (All LTGs Target Date: 08/31/2018)    Baseline  08/03/18: met today    Time  3    Status  Achieved      PT LONG TERM GOAL #2   Title  Patient tolerates prosthesis wear >90% of awake hours without skin issues or limb pain to enable function throughout his day.     Baseline  08/03/18: met today    Status  Achieved      PT LONG TERM GOAL #3   Title  Berg Balance >45/56 to indicate low fall risk.     Time  3    Period  Months    Status  On-going      PT LONG TERM GOAL #4   Title  Patient ambulates 500' outdoors including grass, ramps & curbs with cane or less & prosthesis modified independent for community mobility.     Time  3    Period  Months    Status  On-going       PT LONG TERM GOAL #5   Title  Patient able to perform lifting 25# boxes, carrying, pushing, pulling and climbing A-frame ladder to enable patient goals to return to yard work.     Time  3    Period  Months    Status  On-going  08/03/18 1028  Plan  Clinical Impression Statement Today's skilled session focused on progress toward STGs with all goals met, then focused on gait with prosthesis/cane, progressing to prosthesis only with no issues reported or noted in session. The pt is progressing well toward goals and should benefit from continued PT to progress toward unmet goals.   Pt will benefit from skilled therapeutic intervention in order to improve on the following deficits Abnormal gait;Decreased activity tolerance;Decreased balance;Decreased endurance;Decreased knowledge of use of DME;Decreased mobility;Decreased strength;Decreased skin integrity;Dizziness;Impaired flexibility;Postural dysfunction;Prosthetic Dependency  Rehab Potential Good  PT Frequency 2x / week  PT Duration 12 weeks  PT Treatment/Interventions ADLs/Self Care Home Management;Canalith Repostioning;DME Instruction;Gait training;Stair training;Functional mobility training;Therapeutic activities;Therapeutic exercise;Balance training;Neuromuscular re-education;Patient/family education;Prosthetic Training;Manual techniques;Vestibular  PT Next Visit Plan check if he joined gym & how things are going, balance- add balance ex's to HEP  Consulted and Agree with Plan of Care Patient          Patient will benefit from skilled therapeutic intervention in order to improve the following deficits and impairments:  Abnormal gait, Decreased activity tolerance, Decreased balance, Decreased endurance, Decreased knowledge of use of DME, Decreased mobility, Decreased strength, Decreased skin integrity, Dizziness, Impaired flexibility, Postural dysfunction, Prosthetic Dependency  Visit Diagnosis: Other abnormalities of gait and  mobility  Unsteadiness on feet  Muscle weakness (generalized)       Problem List Patient Active Problem List   Diagnosis Date Noted  . Dehiscence of amputation stump ([place])   . History of right below knee amputation ([place]) 11/08/2017  . Hypoalbuminemia due to protein-calorie malnutrition ([place])   . Urinary retention   . Acute blood loss anemia   . Status post below knee amputation, right ([place]) 10/20/2017  . Unilateral complete BKA ([place]) 10/20/2017  . Sepsis ([place]) 10/12/2017  . Diabetic foot ulcer ([place]) 10/12/2017  . Diabetic foot infection ([place]) 10/12/2017  . Hyponatremia 10/12/2017  . Chronic systolic CHF (congestive heart failure) ([place])   . Hyperkalemia 10/13/2015  . Congestive dilated cardiomyopathy ([place]) 07/13/2015  . Asymptomatic LV dysfunction 06/25/2015  . Poorly controlled diabetes mellitus ([place]) 05/31/2015  . HTN (hypertension), benign 07/04/2014  . Viral cardiomyopathy, EF improved to 45-50% 04/02/2013  . Obesity 04/02/2013  . Diabetic foot ulcer with osteomyelitis ([place]) 04/02/2013  . Hyperlipidemia 04/02/2013  . Sinus tachycardia 04/02/2013    Willow Ora, PTA, [facility] [address], [place], [place] [address] [phone] 08/05/18, 9:46 PM   Name: Brian Herring MRN: [id number] Date of Birth: Sep 20, 1955

## 2018-08-07 ENCOUNTER — Other Ambulatory Visit: Payer: Self-pay | Admitting: Pharmacy Technician

## 2018-08-07 NOTE — Patient Outreach (Signed)
[facility]) Care Management  08/07/2018  Brian Herring [birth year] [id number]   Care coordination call placed to [employer] in regards to patient's [facility] patient assistance application.  Spoke to [employer] who said the application is still waiting on insurance verification which could take up until march 31. She informed that if a patient is in need of a refill then they can call in as before and they would send the request out.  Will followup in 21-28 busines days with [employer] to see if a determination has been made.  [name] P. Elvie Maines, [place] Management [phone]

## 2018-08-07 NOTE — Patient Outreach (Signed)
[facility]) Care Management  08/07/2018  Brian Herring 21-Nov-1955 [id number]   ADDENDUM  Successful outreach call placed to patient in regards to his [place] patient assistance applications for [employer] and [employer].  Spoke to Brian Herring, HIPAA identifiers verified. Informed Brian Herring that [employer] was running behind on processing applications but that he could continue to order his refills as per previous note  Brian Herring informed me that his [name] that we reordered last week arrived yesterday. Brian Herring also informed me that his [name] arrived today. He verbalized no additional questions or concerns at this time.  Will followup with Brian Herring on his [name] application with [employer] once [employer] has made a determination which could be as late as March 31.  Brian Herring P. Delorice Bannister, [place] Management [phone]

## 2018-08-08 ENCOUNTER — Encounter: Payer: Self-pay | Admitting: Physical Therapy

## 2018-08-08 ENCOUNTER — Ambulatory Visit: Payer: PPO | Admitting: Physical Therapy

## 2018-08-08 DIAGNOSIS — R2681 Unsteadiness on feet: Secondary | ICD-10-CM

## 2018-08-08 DIAGNOSIS — R2689 Other abnormalities of gait and mobility: Secondary | ICD-10-CM | POA: Diagnosis not present

## 2018-08-08 DIAGNOSIS — M6281 Muscle weakness (generalized): Secondary | ICD-10-CM

## 2018-08-08 DIAGNOSIS — R293 Abnormal posture: Secondary | ICD-10-CM

## 2018-08-09 NOTE — Therapy (Signed)
[facility] [address] [place], [place], [address] Phone: [phone]   Fax:  [phone]  Physical Therapy Treatment  Patient Details  Name: Brian Herring MRN: [id number] Date of Birth: 12-27-1955 Referring Provider (PT): Milas Gain, [place]   Encounter Date: 08/08/2018  PT End of Session - 08/08/18 1105    Visit Number  15    Number of Visits  25    Date for PT Re-Evaluation  09/03/18    Authorization Type  [employer] Medicare; $15 co-pay. $3400 oop & $160.49 applied pre-eval    PT Start Time  1015    PT Stop Time  1100    PT Time Calculation (min)  45 min    Equipment Utilized During Treatment  Gait belt    Activity Tolerance  Patient tolerated treatment well    Behavior During Therapy  [facility] for tasks assessed/performed       Past Medical History:  Diagnosis Date  . Arthritis    shoulder  . Cancer ([place])    skin cancer on nose  . Cardiomyopathy ([place])   . Chronic systolic CHF (congestive heart failure) ([place])   . Diabetes mellitus without complication ([place])   . Hyperlipidemia   . Hypertension     Past Surgical History:  Procedure Laterality Date  . AMPUTATION Right 10/14/2017   Procedure: AMPUTATION  GREAT TOE;  Surgeon: [name], MD;  Location: [facility];  Service: Orthopedics;  Laterality: Right;  . AMPUTATION Right 10/18/2017   Procedure: RIGHT BELOW KNEE AMPUTATION;  Surgeon: [name], MD;  Location: [facility];  Service: Orthopedics;  Laterality: Right;  . CYSTOSCOPY WITH INSERTION OF UROLIFT N/A 03/15/2018   Procedure: CYSTOSCOPY WITH INSERTION OF UROLIFT;  Surgeon: Franchot Gallo, MD;  [facility];  Service: Urology;  Laterality: N/A;  . SHOULDER SURGERY  01/05  . STUMP REVISION Right 12/15/2017   Procedure: REVISION RIGHT BELOW KNEE AMPUTATION;  Surgeon: [name], MD;  Location: [facility];  Service: Orthopedics;  Laterality: Right;    There were no vitals filed  for this visit.  Subjective Assessment - 08/08/18 1015    Subjective  No falls. He has not joined gym. He plans to go to look at [employer], [employer].     Pertinent History  R TTA, DM2, cardiomyopathy, tachycardia, CHF, HTN, obesity, HLD, arthritis,     Limitations  Lifting;Standing;House hold activities;Walking    Patient Stated Goals  Go back to work part-time doing something, walk with prosthesis, get back where I used to be    Currently in Pain?  No/denies                       [facility] Adult PT Treatment/Exercise - 08/08/18 1015      Transfers   Transfers  Sit to Stand;Stand to Sit    Sit to Stand  6: Modified independent (Device/Increase time);With upper extremity assist;From chair/3-in-1    Stand to Sit  6: Modified independent (Device/Increase time);With upper extremity assist;To chair/3-in-1      Ambulation/Gait   Ambulation/Gait  Yes    Ambulation/Gait Assistance  5: Supervision    Ambulation/Gait Assistance Details  visual & verbal cues on step width 2-4" / not abducting,     Ambulation Distance (Feet)  300 Feet   300   Assistive device  Prosthesis;None    Gait Pattern  Step-through pattern;Decreased stride length;Trunk flexed;Decreased stance time - right  Ambulation Surface  Indoor;Level    Ramp  --    Curb  --      Neuro Re-ed    Neuro Re-ed Details   standing upright with equal wt on LEs: red theraband reciprocal & BUEs rows, biceps curls and forward reach      Knee/Hip Exercises: Stretches   Active Hamstring Stretch  Right;Left;1 rep;20 seconds      Knee/Hip Exercises: Machines for Strengthening   Cybex Leg Press  100# 15 reps 2 sets instruction in technique      Prosthetics   Prosthetic Care Comments   educated on cleaning limb in invaginated areas. Removing shoes for MD appts so can be assessed to catch problems early.  When has socket revision, consider using old socket for water leg.     Current prosthetic wear tolerance (days/week)    daily     Current prosthetic wear tolerance (#hours/day)   all awake hours, drying as needed    Residual limb condition   intact with no issues, normal color, temperature & moisture,  dead skin in invaginated incision.     Education Provided  Skin check;Residual limb care;Other (comment)   see prosthetic care comments   Person(s) Educated  Patient    Education Method  Explanation;Verbal cues    Education Method  Verbalized understanding;Verbal cues required;Needs further instruction          Balance Exercises - 08/08/18 1015      Balance Exercises: Standing   Standing Eyes Opened  Wide ([place]);Head turns;Foam/compliant surface;5 reps   4 directions head movements   Standing Eyes Closed  Wide (BOA);Head turns;Solid surface;5 reps   4 directions head movements       PT Education - 08/09/18 0856    Education Details  [employer] may have [place] program that covers membership to fitness center including [employer] that is closer to his home per pt    Person(s) Educated  Patient    Methods  Explanation    Comprehension  Verbalized understanding       PT Short Term Goals - 08/03/18 1029      PT SHORT TERM GOAL #1   Title  Patient verbalizes adjusting ply socks to accomodate limb volume changes. (All updated STGs Target Date: 08/10/2018)    Baseline  08/03/18: met to date    Status  Achieved      PT SHORT TERM GOAL #2   Title  Patient verbalizes prosthesis wear >90% of awake hours with PT guidance on skin issues or limb discomfort.     Baseline  08/03/18: all awake hours    Status  Achieved      PT SHORT TERM GOAL #3   Title  Patient ambulates 500' outdoors on paved surfaces & grass with cane & prosthesis with supervision.     Baseline  08/03/18: met today    Status  Achieved      PT SHORT TERM GOAL #4   Title  Patient ambulates 100' with prosthesis only with minA    Baseline  08/03/18: met today    Status  Achieved      PT SHORT TERM GOAL #5   Title  Patient  negotiates ramps & curbs with cane & prosthesis with supervision.     Baseline  08/03/18: met today    Status  Achieved        PT Long Term Goals - 08/03/18 1056      PT LONG TERM GOAL #1  Title  Patient verbalizes & demonstrates proper prosthetic care to enable safe use of prosthesis. (All LTGs Target Date: 08/31/2018)    Baseline  08/03/18: met today    Time  3    Status  Achieved      PT LONG TERM GOAL #2   Title  Patient tolerates prosthesis wear >90% of awake hours without skin issues or limb pain to enable function throughout his day.     Baseline  08/03/18: met today    Status  Achieved      PT LONG TERM GOAL #3   Title  Berg Balance >45/56 to indicate low fall risk.     Time  3    Period  Months    Status  On-going      PT LONG TERM GOAL #4   Title  Patient ambulates 500' outdoors including grass, ramps & curbs with cane or less & prosthesis modified independent for community mobility.     Time  3    Period  Months    Status  On-going      PT LONG TERM GOAL #5   Title  Patient able to perform lifting 25# boxes, carrying, pushing, pulling and climbing A-frame ladder to enable patient goals to return to yard work.     Time  3    Period  Months    Status  On-going            Plan - 08/08/18 1900    Clinical Impression Statement  Today's skilled session focused on balance activities and prosthetic gait. He improved posture with tactile & verbal cues.     Rehab Potential  Good    PT Frequency  2x / week    PT Duration  12 weeks    PT Treatment/Interventions  ADLs/Self Care Home Management;Canalith Repostioning;DME Instruction;Gait training;Stair training;Functional mobility training;Therapeutic activities;Therapeutic exercise;Balance training;Neuromuscular re-education;Patient/family education;Prosthetic Training;Manual techniques;Vestibular    PT Next Visit Plan  check if he joined gym & how things are going, balance- add balance ex's to HEP    Consulted and Agree  with Plan of Care  Patient       Patient will benefit from skilled therapeutic intervention in order to improve the following deficits and impairments:  Abnormal gait, Decreased activity tolerance, Decreased balance, Decreased endurance, Decreased knowledge of use of DME, Decreased mobility, Decreased strength, Decreased skin integrity, Dizziness, Impaired flexibility, Postural dysfunction, Prosthetic Dependency  Visit Diagnosis: Unsteadiness on feet  Other abnormalities of gait and mobility  Muscle weakness (generalized)  Abnormal posture     Problem List Patient Active Problem List   Diagnosis Date Noted  . Dehiscence of amputation stump ([place])   . History of right below knee amputation ([place]) 11/08/2017  . Hypoalbuminemia due to protein-calorie malnutrition ([place])   . Urinary retention   . Acute blood loss anemia   . Status post below knee amputation, right ([place]) 10/20/2017  . Unilateral complete BKA ([place]) 10/20/2017  . Sepsis ([place]) 10/12/2017  . Diabetic foot ulcer ([place]) 10/12/2017  . Diabetic foot infection ([place]) 10/12/2017  . Hyponatremia 10/12/2017  . Chronic systolic CHF (congestive heart failure) ([place])   . Hyperkalemia 10/13/2015  . Congestive dilated cardiomyopathy ([place]) 07/13/2015  . Asymptomatic LV dysfunction 06/25/2015  . Poorly controlled diabetes mellitus ([place]) 05/31/2015  . HTN (hypertension), benign 07/04/2014  . Viral cardiomyopathy, EF improved to 45-50% 04/02/2013  . Obesity 04/02/2013  . Diabetic foot ulcer with osteomyelitis ([place]) 04/02/2013  . Hyperlipidemia 04/02/2013  . Sinus  tachycardia 04/02/2013    Jannis Atkins PT, DPT 08/09/2018, 9:04 AM  [facility] [address] [place], [place], [address] Phone: [phone]   Fax:  [phone]  Name: Brian Herring MRN: [id number] Date of Birth: 1955-10-17

## 2018-08-10 ENCOUNTER — Encounter: Payer: Self-pay | Admitting: Rehabilitation

## 2018-08-10 ENCOUNTER — Ambulatory Visit: Payer: PPO | Admitting: Rehabilitation

## 2018-08-10 DIAGNOSIS — R293 Abnormal posture: Secondary | ICD-10-CM

## 2018-08-10 DIAGNOSIS — R2689 Other abnormalities of gait and mobility: Secondary | ICD-10-CM | POA: Diagnosis not present

## 2018-08-10 DIAGNOSIS — R2681 Unsteadiness on feet: Secondary | ICD-10-CM

## 2018-08-10 DIAGNOSIS — M6281 Muscle weakness (generalized): Secondary | ICD-10-CM

## 2018-08-10 NOTE — Therapy (Signed)
[facility] [address] [place], [place], [address] Phone: [phone]   Fax:  [phone]  Physical Therapy Treatment  Patient Details  Name: Brian Herring MRN: [id number] Date of Birth: [birth year] Referring Provider (PT): Milas Gain, [place]   Encounter Date: 08/10/2018  PT End of Session - 08/10/18 1007    Visit Number  16    Number of Visits  25    Date for PT Re-Evaluation  09/03/18    Authorization Type  [employer]; $15 co-pay. $3400 oop & $160.49 applied pre-eval    PT Start Time  1010    PT Stop Time  1055    PT Time Calculation (min)  45 min    Equipment Utilized During Treatment  Gait belt    Activity Tolerance  Patient tolerated treatment well    Behavior During Therapy  [facility] for tasks assessed/performed       Past Medical History:  Diagnosis Date  . Arthritis    shoulder  . Cancer ([place])    skin cancer on nose  . Cardiomyopathy ([place])   . Chronic systolic CHF (congestive heart failure) ([place])   . Diabetes mellitus without complication ([place])   . Hyperlipidemia   . Hypertension     Past Surgical History:  Procedure Laterality Date  . AMPUTATION Right 10/14/2017   Procedure: AMPUTATION  GREAT TOE;  Surgeon: [name], MD;  Location: [facility];  Service: Orthopedics;  Laterality: Right;  . AMPUTATION Right 10/18/2017   Procedure: RIGHT BELOW KNEE AMPUTATION;  Surgeon: [name], MD;  Location: [place] [facility];  Service: Orthopedics;  Laterality: Right;  . CYSTOSCOPY WITH INSERTION OF UROLIFT N/A 03/15/2018   Procedure: CYSTOSCOPY WITH INSERTION OF UROLIFT;  Surgeon: Franchot Gallo, MD;  Location: [facility];  Service: Urology;  Laterality: N/A;  . SHOULDER SURGERY  01/05  . STUMP REVISION Right 12/15/2017   Procedure: REVISION RIGHT BELOW KNEE AMPUTATION;  Surgeon: [name], MD;  Location: [facility];  Service: Orthopedics;  Laterality: Right;    There were no vitals filed  for this visit.  Subjective Assessment - 08/10/18 1007    Subjective  Reports he got into [employer] with [employer]akers.     Pertinent History  R TTA, DM2, cardiomyopathy, tachycardia, CHF, HTN, obesity, HLD, arthritis,     Limitations  Lifting;Standing;House hold activities;Walking    Patient Stated Goals  Go back to work part-time doing something, walk with prosthesis, get back where I used to be    Currently in Pain?  No/denies                       [facility] Adult PT Treatment/Exercise - 08/10/18 1018      Ambulation/Gait   Ambulation/Gait  Yes    Ambulation/Gait Assistance  5: Supervision;4: Min guard    Ambulation/Gait Assistance Details  Had pt ambulate outdoors over paved and grassy unlevel surfaces without AD and with prosthesis only.  Pt at mostly S level, however note that on more uneven surfaces pt needs intermittent min/guard for safety.      Ambulation Distance (Feet)  500 Feet    Assistive device  Prosthesis;None    Gait Pattern  Step-through pattern;Decreased stride length;Trunk flexed;Decreased stance time - right    Ambulation Surface  Level;Unlevel;Indoor;Outdoor;Paved;Gravel;Grass      Standardized Balance Assessment   Standardized Balance Assessment  Berg Balance Test      [facility] Balance Test  Sit to Stand  Able to stand without using hands and stabilize independently    Standing Unsupported  Able to stand safely 2 minutes    Sitting with Back Unsupported but Feet Supported on Floor or Stool  Able to sit safely and securely 2 minutes    Stand to Sit  Sits safely with minimal use of hands    Transfers  Able to transfer safely, minor use of hands    Standing Unsupported with Eyes Closed  Able to stand 10 seconds safely    Standing Ubsupported with Feet Together  Able to place feet together independently and stand 1 minute safely    From Standing, Reach Forward with Outstretched Arm  Can reach confidently >25 cm (10")    From Standing Position, Pick up Object  from Floor  Able to pick up shoe safely and easily    From Standing Position, Turn to Look Behind Over each Shoulder  Looks behind from both sides and weight shifts well    Turn 360 Degrees  Able to turn 360 degrees safely but slowly    Standing Unsupported, Alternately Place Feet on Step/Stool  Able to complete 4 steps without aid or supervision    Standing Unsupported, One Foot in Front  Able to plae foot ahead of the other independently and hold 30 seconds    Standing on One Leg  Tries to lift leg/unable to hold 3 seconds but remains standing independently    Total Score  48      High Level Balance   High Level Balance Comments  Initiated corner balance exercises to carryover to improved balance and gait outdoors.  See pt instruction for details.       Therapeutic Activites    Other Therapeutic Activities  Performed lifting during session with 15 lb crate progressing to 25# crate and carrying each x 115' and then placing back on floor.  Min cues for proper body mechanics and placing prosthesis more to the side to decrease distance from item, esp the heavier item gets.  Pt returned demo.  Pt able to retrieve, carry and set up/dlimb ladder today with very min cues (was able to recall what previous PT had taught).  Pt reports that he doesn't plan to get on ladders anymore due to safety concerns.          Access Code: [email or web address]  URL: [email or web address]  Date: 08/10/2018  Prepared by: Cameron Sprang   Exercises  Wide Stance with Head Nods on Foam Pad - 10 reps - 1 sets - 2x daily - 7x weekly  Romberg Stance Eyes Closed on Foam Pad - 3 reps - 1 sets - 15 hold - 2x daily - 7x weekly  Tandem Stance on Foam Pad with Eyes Open - 3 reps - 1 sets - 15 hold - 2x daily - 7x weekly       PT Education - 08/10/18 1059    Education Details  HEP for corner balance    Person(s) Educated  Patient    Methods  Explanation;Demonstration;Handout    Comprehension  Verbalized  understanding;Returned demonstration       PT Short Term Goals - 08/03/18 1029      PT SHORT TERM GOAL #1   Title  Patient verbalizes adjusting ply socks to accomodate limb volume changes. (All updated STGs Target Date: 08/10/2018)    Baseline  08/03/18: met to date    Status  Achieved      PT SHORT TERM GOAL #2  Title  Patient verbalizes prosthesis wear >90% of awake hours with PT guidance on skin issues or limb discomfort.     Baseline  08/03/18: all awake hours    Status  Achieved      PT SHORT TERM GOAL #3   Title  Patient ambulates 500' outdoors on paved surfaces & grass with cane & prosthesis with supervision.     Baseline  08/03/18: met today    Status  Achieved      PT SHORT TERM GOAL #4   Title  Patient ambulates 100' with prosthesis only with minA    Baseline  08/03/18: met today    Status  Achieved      PT SHORT TERM GOAL #5   Title  Patient negotiates ramps & curbs with cane & prosthesis with supervision.     Baseline  08/03/18: met today    Status  Achieved        PT Long Term Goals - 08/10/18 1008      PT LONG TERM GOAL #1   Title  Patient verbalizes & demonstrates proper prosthetic care to enable safe use of prosthesis. (All LTGs Target Date: 08/31/2018)    Baseline  08/03/18: met today    Time  3    Status  Achieved      PT LONG TERM GOAL #2   Title  Patient tolerates prosthesis wear >90% of awake hours without skin issues or limb pain to enable function throughout his day.     Baseline  08/03/18: met today    Status  Achieved      PT LONG TERM GOAL #3   Title  Berg Balance >45/56 to indicate low fall risk.     Baseline  48/56 on 08/10/18    Time  3    Period  Months    Status  Achieved      PT LONG TERM GOAL #4   Title  Patient ambulates 500' outdoors including grass, ramps & curbs with cane or less & prosthesis modified independent for community mobility.     Baseline  S level on 08/10/18    Time  3    Period  Months    Status  On-going      PT LONG  TERM GOAL #5   Title  Patient able to perform lifting 25# boxes, carrying, pushing, pulling and climbing A-frame ladder to enable patient goals to return to yard work.     Baseline  Pt able to lift and carry 25# with min cues and demo for correct body mechanics, climb ladder on 08/10/18    Time  3    Period  Months    Status  On-going            Plan - 08/10/18 1100    Clinical Impression Statement  Skilled session focused on beginning to check remaining LTGs.  Pt has met BERG balance goal, but still having some difficulty with balance on compliant surfaces and outdoor gait without device.  Will continue to work towards these goals but anticipate earlier than expected D/C.     Rehab Potential  Good    PT Frequency  2x / week    PT Duration  12 weeks    PT Treatment/Interventions  ADLs/Self Care Home Management;Canalith Repostioning;DME Instruction;Gait training;Stair training;Functional mobility training;Therapeutic activities;Therapeutic exercise;Balance training;Neuromuscular re-education;Patient/family education;Prosthetic Training;Manual techniques;Vestibular    PT Next Visit Plan  He got Silver Sneakers-has he gone? outdoors without AD, balance on compliant surfaces.  Consulted and Agree with Plan of Care  Patient       Patient will benefit from skilled therapeutic intervention in order to improve the following deficits and impairments:  Abnormal gait, Decreased activity tolerance, Decreased balance, Decreased endurance, Decreased knowledge of use of DME, Decreased mobility, Decreased strength, Decreased skin integrity, Dizziness, Impaired flexibility, Postural dysfunction, Prosthetic Dependency  Visit Diagnosis: Unsteadiness on feet  Other abnormalities of gait and mobility  Muscle weakness (generalized)  Abnormal posture     Problem List Patient Active Problem List   Diagnosis Date Noted  . Dehiscence of amputation stump ([place])   . History of right below knee  amputation ([place]) 11/08/2017  . Hypoalbuminemia due to protein-calorie malnutrition ([place])   . Urinary retention   . Acute blood loss anemia   . Status post below knee amputation, right ([place]) 10/20/2017  . Unilateral complete BKA ([place]) 10/20/2017  . Sepsis ([place]) 10/12/2017  . Diabetic foot ulcer ([place]) 10/12/2017  . Diabetic foot infection ([place]) 10/12/2017  . Hyponatremia 10/12/2017  . Chronic systolic CHF (congestive heart failure) ([place])   . Hyperkalemia 10/13/2015  . Congestive dilated cardiomyopathy ([place]) 07/13/2015  . Asymptomatic LV dysfunction 06/25/2015  . Poorly controlled diabetes mellitus ([place]) 05/31/2015  . HTN (hypertension), benign 07/04/2014  . Viral cardiomyopathy, EF improved to 45-50% 04/02/2013  . Obesity 04/02/2013  . Diabetic foot ulcer with osteomyelitis ([place]) 04/02/2013  . Hyperlipidemia 04/02/2013  . Sinus tachycardia 04/02/2013    Cameron Sprang, PT, MPT [facility] [address] [place], [place], [address] Phone: [phone]   Fax:  [phone] 08/10/18, 11:02 AM  Name: Brian Herring MRN: [id number] Date of Birth: 1956/03/31

## 2018-08-10 NOTE — Patient Instructions (Signed)
Access Code: [email or web address]  URL: [email or web address]  Date: 08/10/2018  Prepared by: Cameron Sprang   Exercises  Wide Stance with Head Nods on Foam Pad - 10 reps - 1 sets - 2x daily - 7x weekly  Romberg Stance Eyes Closed on Foam Pad - 3 reps - 1 sets - 15 hold - 2x daily - 7x weekly  Tandem Stance on Foam Pad with Eyes Open - 3 reps - 1 sets - 15 hold - 2x daily - 7x weekly

## 2018-08-15 ENCOUNTER — Encounter: Payer: Self-pay | Admitting: Physical Therapy

## 2018-08-15 ENCOUNTER — Ambulatory Visit: Payer: PPO | Admitting: Physical Therapy

## 2018-08-15 DIAGNOSIS — S88119A Complete traumatic amputation at level between knee and ankle, unspecified lower leg, initial encounter: Secondary | ICD-10-CM | POA: Diagnosis not present

## 2018-08-15 DIAGNOSIS — M6281 Muscle weakness (generalized): Secondary | ICD-10-CM

## 2018-08-15 DIAGNOSIS — R293 Abnormal posture: Secondary | ICD-10-CM

## 2018-08-15 DIAGNOSIS — R2689 Other abnormalities of gait and mobility: Secondary | ICD-10-CM

## 2018-08-15 DIAGNOSIS — R2681 Unsteadiness on feet: Secondary | ICD-10-CM

## 2018-08-15 DIAGNOSIS — Z89511 Acquired absence of right leg below knee: Secondary | ICD-10-CM

## 2018-08-15 NOTE — Patient Instructions (Signed)
Fitness Plan has 4 components.  1. Endurance - Recommend machines that are sitting with back support that uses both your arms and your legs.Or can do walking program Goal is 20-30 minutes. 2. Strength - weight machines enough weight to have resistance but not so much that you have to strain or cheat.  Goal is to do 15 repetitions for 2-3 sets. Rest 30-60 seconds between sets.  Do 2-4 leg machines, 2-4 arm machines & 2-4 trunk machines. Look for pictures to make sure you exercise both sides of arm, leg or trunk.  Leg machines like leg press (can do both legs or each leg by themselves),   At home can do theraband exercises for arms or legs, floor transfers, sit to stand to sit using arms as little as possible, Yoga positions 3.Flexibility - make sure you include arms, legs & trunk. Can do Yoga also 4. Balance- can work in corner with chair in front - head turns, arm motions, eyes closed; place one foot inside cabinet or on 4-6" block to work on one-legged stance,   Try to get to each component 3-5 times per week.  Going to [facility] or fitness center you want do things you can not do at home.  For example use bikes at [facility] if you don't have one at home. Then on days you don't go to [facility], do exercises at home.  At least 150 minutes a week of moderate intensity exercise. Try not to go more than 2 days in a row without being active for at least 30 minutes a day. Any activity where you are up and moving is good- Walking, bicycling, stationary bicycling, dancing.  (moderate intensity means to get  a little out of breath)   Do resistance exercise at least 2 times a week.  This can be yoga poses or strength training where you lift your own weight (think leg lifts or toe raises)  or light weights like cans of beans with your arms.    Flexibility and balance exercise- safe stretching and practicing balance is very important to health.

## 2018-08-16 NOTE — Therapy (Signed)
[facility] [address] [place], [place], [address] Phone: [phone]   Fax:  [phone]  Physical Therapy Treatment  Patient Details  Name: Brian Herring MRN: [id number] Date of Birth: [birth year] Referring Provider (PT): Milas Gain, [place]   Encounter Date: 08/15/2018  PT End of Session - 08/15/18 1155    Visit Number  17    Number of Visits  25    Date for PT Re-Evaluation  09/03/18    Authorization Type  [employer] Medicare; $15 co-pay. $3400 oop & $160.49 applied pre-eval    PT Start Time  1015    PT Stop Time  1100    PT Time Calculation (min)  45 min    Equipment Utilized During Treatment  Gait belt    Activity Tolerance  Patient tolerated treatment well    Behavior During Therapy  [facility] for tasks assessed/performed       Past Medical History:  Diagnosis Date  . Arthritis    shoulder  . Cancer ([place])    skin cancer on nose  . Cardiomyopathy ([place])   . Chronic systolic CHF (congestive heart failure) ([place])   . Diabetes mellitus without complication ([place])   . Hyperlipidemia   . Hypertension     Past Surgical History:  Procedure Laterality Date  . AMPUTATION Right 10/14/2017   Procedure: AMPUTATION  GREAT TOE;  Surgeon: [name], MD;  Location: [facility];  Service: Orthopedics;  Laterality: Right;  . AMPUTATION Right 10/18/2017   Procedure: RIGHT BELOW KNEE AMPUTATION;  Surgeon: [name], MD;  Location: [facility];  Service: Orthopedics;  Laterality: Right;  . CYSTOSCOPY WITH INSERTION OF UROLIFT N/A 03/15/2018   Procedure: CYSTOSCOPY WITH INSERTION OF UROLIFT;  Surgeon: Franchot Gallo, MD;  [facility];  Service: Urology;  Laterality: N/A;  . SHOULDER SURGERY  01/05  . STUMP REVISION Right 12/15/2017   Procedure: REVISION RIGHT BELOW KNEE AMPUTATION;  Surgeon: [name], MD;  Location: [place];  Service: Orthopedics;  Laterality: Right;    There were no vitals filed  for this visit.  Subjective Assessment - 08/15/18 1015    Subjective  He went thru orientation at [facility] and walked on treadmill for 5 minutes & recumbent bike with BUEs/ BLEs for 10 minutes. No issues.     Pertinent History  R TTA, DM2, cardiomyopathy, tachycardia, CHF, HTN, obesity, HLD, arthritis,     Limitations  Lifting;Standing;House hold activities;Walking    Patient Stated Goals  Go back to work part-time doing something, walk with prosthesis, get back where I used to be    Currently in Pain?  No/denies                       [facility] Adult PT Treatment/Exercise - 08/15/18 1015      Ambulation/Gait   Ambulation/Gait  Yes    Ambulation/Gait Assistance  5: Supervision;4: Min guard    Ambulation/Gait Assistance Details  verbal cues on not abducting & upright posture    Ambulation Distance (Feet)  500 Feet    Assistive device  Prosthesis;None    Gait Pattern  Step-through pattern;Decreased stride length;Trunk flexed;Decreased stance time - right    Ambulation Surface  Indoor;Level    Stairs  Yes    Stairs Assistance  5: Supervision    Stairs Assistance Details (indicate cue type and reason)  demo & verbal cues on alternating pattern    Ramp  5: Supervision  prosthesis only   Ramp Details (indicate cue type and reason)  verbal & tactile cues on upright posture, wt shift & step thru pattern    Curb  5: Supervision   prosthesis only   Curb Details (indicate cue type and reason)  verbal & tactile cues on upright posture, wt shift & step thru pattern      Standardized Balance Assessment   Standardized Balance Assessment  --      Berg Balance Test   Sit to Stand  --    Standing Unsupported  --    Sitting with Back Unsupported but Feet Supported on Floor or Stool  --    Stand to Sit  --    Transfers  --    Standing Unsupported with Eyes Closed  --    Standing Ubsupported with Feet Together  --    From Standing, Reach Forward with Outstretched Arm  --    From Standing  Position, Pick up Object from Floor  --    From Standing Position, Turn to Look Behind Over each Shoulder  --    Turn 360 Degrees  --    Standing Unsupported, Alternately Place Feet on Step/Stool  --    Standing Unsupported, One Foot in Front  --    Standing on One Leg  --    Total Score  --      High Level Balance   High Level Balance Comments  --      Therapeutic Activites    Other Therapeutic Activities  --      Prosthetics   Prosthetic Care Comments   Verbal cues on skin care with drying limb/liner with sweating, rubbing limb with cloth preferably wet when removing prosthesis to decrease scratching with itching feeling, signs/symptoms sweatin and use of cutoff sock below upper liner & over liner distally to increase limb volume management.     Current prosthetic wear tolerance (days/week)   daily     Current prosthetic wear tolerance (#hours/day)   all awake hours, drying as needed    Residual limb condition   scratches & 10m wound proximal to knee appears from scratching    Education Provided  Skin check;Residual limb care;Correct ply sock adjustment;Other (comment)   see prosthetic care comments   Person(s) Educated  Patient    Education Method  Explanation;Verbal cues;Demonstration    Education Method  Verbalized understanding;Needs further instruction    Donning Prosthesis  Supervision    Doffing Prosthesis  Modified independent (device/increased time)             PT Education - 08/15/18 1040    Education Details  components of well-rounded fitness plan    Person(s) Educated  Patient    Methods  Explanation;Verbal cues;Handout    Comprehension  Verbalized understanding       PT Short Term Goals - 08/03/18 1029      PT SHORT TERM GOAL #1   Title  Patient verbalizes adjusting ply socks to accomodate limb volume changes. (All updated STGs Target Date: 08/10/2018)    Baseline  08/03/18: met to date    Status  Achieved      PT SHORT TERM GOAL #2   Title  Patient  verbalizes prosthesis wear >90% of awake hours with PT guidance on skin issues or limb discomfort.     Baseline  08/03/18: all awake hours    Status  Achieved      PT SHORT TERM GOAL #3   Title  Patient  ambulates 500' outdoors on paved surfaces & grass with cane & prosthesis with supervision.     Baseline  08/03/18: met today    Status  Achieved      PT SHORT TERM GOAL #4   Title  Patient ambulates 100' with prosthesis only with minA    Baseline  08/03/18: met today    Status  Achieved      PT SHORT TERM GOAL #5   Title  Patient negotiates ramps & curbs with cane & prosthesis with supervision.     Baseline  08/03/18: met today    Status  Achieved        PT Long Term Goals - 08/15/18 1800      PT LONG TERM GOAL #1   Title  Patient verbalizes & demonstrates proper prosthetic care to enable safe use of prosthesis. (All LTGs Target Date: 08/31/2018)    Time  3    Status  On-going    Target Date  08/31/18      PT LONG TERM GOAL #2   Title  Patient tolerates prosthesis wear >90% of awake hours without skin issues or limb pain to enable function throughout his day.     Status  On-going    Target Date  08/31/18      PT LONG TERM GOAL #3   Title  Berg Balance >45/56 to indicate low fall risk.     Baseline  48/56 on 08/10/2018    Time  3    Period  Months    Status  Achieved      PT LONG TERM GOAL #4   Title  Patient ambulates 500' outdoors including grass, ramps & curbs with cane or less & prosthesis modified independent for community mobility.     Time  3    Period  Months    Status  On-going    Target Date  08/31/18      PT LONG TERM GOAL #5   Title  Patient able to perform lifting 25# boxes, carrying, pushing, pulling and climbing A-frame ladder to enable patient goals to return to yard work.     Time  3    Period  Months    Status  On-going    Target Date  08/31/18            Plan - 08/15/18 1707    Clinical Impression Statement  Patient seems to have better  understanding of well-rounded fitness program between [facility] exercises & HEP.  PT also instructed in skin care with issues of itching / scratching and use of cutoff socks.     Rehab Potential  Good    PT Frequency  2x / week    PT Duration  12 weeks    PT Treatment/Interventions  ADLs/Self Care Home Management;Canalith Repostioning;DME Instruction;Gait training;Stair training;Functional mobility training;Therapeutic activities;Therapeutic exercise;Balance training;Neuromuscular re-education;Patient/family education;Prosthetic Training;Manual techniques;Vestibular    PT Next Visit Plan  work towards [place], high level balance activities and gait on compliant surfaces    Consulted and Agree with Plan of Care  Patient       Patient will benefit from skilled therapeutic intervention in order to improve the following deficits and impairments:  Abnormal gait, Decreased activity tolerance, Decreased balance, Decreased endurance, Decreased knowledge of use of DME, Decreased mobility, Decreased strength, Decreased skin integrity, Dizziness, Impaired flexibility, Postural dysfunction, Prosthetic Dependency  Visit Diagnosis: Unsteadiness on feet  Other abnormalities of gait and mobility  Muscle weakness (generalized)  Abnormal posture  Status  post below knee amputation, right [facility])     Problem List Patient Active Problem List   Diagnosis Date Noted  . Dehiscence of amputation stump ([place])   . History of right below knee amputation ([place]) 11/08/2017  . Hypoalbuminemia due to protein-calorie malnutrition ([place])   . Urinary retention   . Acute blood loss anemia   . Status post below knee amputation, right ([place]) 10/20/2017  . Unilateral complete BKA ([place]) 10/20/2017  . Sepsis ([place]) 10/12/2017  . Diabetic foot ulcer ([place]) 10/12/2017  . Diabetic foot infection ([place]) 10/12/2017  . Hyponatremia 10/12/2017  . Chronic systolic CHF (congestive heart failure) ([place])   . Hyperkalemia 10/13/2015  .  Congestive dilated cardiomyopathy ([place]) 07/13/2015  . Asymptomatic LV dysfunction 06/25/2015  . Poorly controlled diabetes mellitus ([place]) 05/31/2015  . HTN (hypertension), benign 07/04/2014  . Viral cardiomyopathy, EF improved to 45-50% 04/02/2013  . Obesity 04/02/2013  . Diabetic foot ulcer with osteomyelitis ([place]) 04/02/2013  . Hyperlipidemia 04/02/2013  . Sinus tachycardia 04/02/2013    Ceili Boshers PT, DPT 08/16/2018, 7:11 AM  [facility] [address] [place], [place], [address] Phone: [phone]   Fax:  [phone]  Name: Brian Herring MRN: [id number] Date of Birth: 07-07-1956

## 2018-08-17 ENCOUNTER — Encounter: Payer: Self-pay | Admitting: Physical Therapy

## 2018-08-17 ENCOUNTER — Ambulatory Visit: Payer: PPO | Admitting: Physical Therapy

## 2018-08-17 DIAGNOSIS — R293 Abnormal posture: Secondary | ICD-10-CM

## 2018-08-17 DIAGNOSIS — R2689 Other abnormalities of gait and mobility: Secondary | ICD-10-CM | POA: Diagnosis not present

## 2018-08-17 DIAGNOSIS — R2681 Unsteadiness on feet: Secondary | ICD-10-CM

## 2018-08-17 DIAGNOSIS — M6281 Muscle weakness (generalized): Secondary | ICD-10-CM

## 2018-08-19 NOTE — Therapy (Signed)
[facility] [address] [place], [place], [address] Phone: [phone]   Fax:  [phone]  Physical Therapy Treatment  Patient Details  Name: Brian Herring MRN: [id number] Date of Birth: 07-Dec-1955 Referring Provider (PT): Milas Gain, [place]   Encounter Date: 08/17/2018     08/17/18 1020  PT Visits / Re-Eval  Visit Number 18  Number of Visits 25  Date for PT Re-Evaluation 09/03/18  Authorization  Authorization Type [employer]; $15 co-pay. $3400 oop & $160.49 applied pre-eval  PT Time Calculation  PT Start Time 1018  PT Stop Time 1102  PT Time Calculation (min) 44 min  PT - End of Session  Equipment Utilized During Treatment Gait belt  Activity Tolerance Patient tolerated treatment well  Behavior During Therapy [facility] for tasks assessed/performed    Past Medical History:  Diagnosis Date  . Arthritis    shoulder  . Cancer ([place])    skin cancer on nose  . Cardiomyopathy ([place])   . Chronic systolic CHF (congestive heart failure) ([place])   . Diabetes mellitus without complication ([place])   . Hyperlipidemia   . Hypertension     Past Surgical History:  Procedure Laterality Date  . AMPUTATION Right 10/14/2017   Procedure: AMPUTATION  GREAT TOE;  Surgeon: [name], MD;  Location: [facility];  Service: Orthopedics;  Laterality: Right;  . AMPUTATION Right 10/18/2017   Procedure: RIGHT BELOW KNEE AMPUTATION;  Surgeon: [name], MD;  Location: [facility];  Service: Orthopedics;  Laterality: Right;  . CYSTOSCOPY WITH INSERTION OF UROLIFT N/A 03/15/2018   Procedure: CYSTOSCOPY WITH INSERTION OF UROLIFT;  Surgeon: Franchot Gallo, MD;  Location: [facility];  Service: Urology;  Laterality: N/A;  . SHOULDER SURGERY  01/05  . STUMP REVISION Right 12/15/2017   Procedure: REVISION RIGHT BELOW KNEE AMPUTATION;  Surgeon: [name], MD;  Location: [facility];  Service: Orthopedics;  Laterality: Right;     There were no vitals filed for this visit.      08/17/18 1018  Symptoms/Limitations  Subjective No new complaints. No falls or pain to report. Noted his prosthetic foot tends to rotate in. Advised pt to call [facility] and get an appt to have it adjusted.   Pertinent History R TTA, DM2, cardiomyopathy, tachycardia, CHF, HTN, obesity, HLD, arthritis,   Limitations Lifting;Standing;House hold activities;Walking  Patient Stated Goals Go back to work part-time doing something, walk with prosthesis, get back where I used to be  Pain Assessment  Currently in Pain? No/denies  Pain Score 0     08/17/18 1022  High Level Balance  High Level Balance Activities Other (comment);Marching forwards;Marching backwards;Tandem walking  High Level Balance Comments on airex in corner with chair in front for safety, feet hip width apart: EC no head movements, progressing to EC head movements left<>right, up<>down and diagonals both ways. min guard to min assist for balance with cues on posture and weight shifting to assist with balance.    Neuro Re-ed   Neuro Re-ed Details  for balance/NMR: fwd gait along hallway with head movements left<>fwd<>right, then up<>fwd<>down for 4 laps each with min guard assist. minor veering noted, pt with c/o's of left hip pain and noted to have height descrepancy. pelvic height checked in standing and pt noted to be just shy of 1/2 inch short on prosthesis side.   Prosthetics  Prosthetic Care Comments  call placed to prosthetist. pt to go after session today for height and alignment  check.   Current prosthetic wear tolerance (days/week)  daily   Current prosthetic wear tolerance (#hours/day)  all awake hours, drying as needed  Residual limb condition  pt with scratches present on upper thigh (just above knee), otherwise skin is intact  Donning Prosthesis 6  Doffing Prosthesis 6      PT Short Term Goals - 08/03/18 1029      PT SHORT TERM GOAL #1   Title  Patient  verbalizes adjusting ply socks to accomodate limb volume changes. (All updated STGs Target Date: 08/10/2018)    Baseline  08/03/18: met to date    Status  Achieved      PT SHORT TERM GOAL #2   Title  Patient verbalizes prosthesis wear >90% of awake hours with PT guidance on skin issues or limb discomfort.     Baseline  08/03/18: all awake hours    Status  Achieved      PT SHORT TERM GOAL #3   Title  Patient ambulates 500' outdoors on paved surfaces & grass with cane & prosthesis with supervision.     Baseline  08/03/18: met today    Status  Achieved      PT SHORT TERM GOAL #4   Title  Patient ambulates 100' with prosthesis only with minA    Baseline  08/03/18: met today    Status  Achieved      PT SHORT TERM GOAL #5   Title  Patient negotiates ramps & curbs with cane & prosthesis with supervision.     Baseline  08/03/18: met today    Status  Achieved        PT Long Term Goals - 08/15/18 1800      PT LONG TERM GOAL #1   Title  Patient verbalizes & demonstrates proper prosthetic care to enable safe use of prosthesis. (All LTGs Target Date: 08/31/2018)    Time  3    Status  On-going    Target Date  08/31/18      PT LONG TERM GOAL #2   Title  Patient tolerates prosthesis wear >90% of awake hours without skin issues or limb pain to enable function throughout his day.     Status  On-going    Target Date  08/31/18      PT LONG TERM GOAL #3   Title  Berg Balance >45/56 to indicate low fall risk.     Baseline  48/56 on 08/10/2018    Time  3    Period  Months    Status  Achieved      PT LONG TERM GOAL #4   Title  Patient ambulates 500' outdoors including grass, ramps & curbs with cane or less & prosthesis modified independent for community mobility.     Time  3    Period  Months    Status  On-going    Target Date  08/31/18      PT LONG TERM GOAL #5   Title  Patient able to perform lifting 25# boxes, carrying, pushing, pulling and climbing A-frame ladder to enable patient goals to  return to yard work.     Time  3    Period  Months    Status  On-going    Target Date  08/31/18          08/17/18 1020  Plan  Clinical Impression Statement Today's skilled session focused on prosthetic management and balance reactions with no issues reported. The pt is progressing well towards goals  and should benefit from continued PT to progress toward unmet goals.  Pt will benefit from skilled therapeutic intervention in order to improve on the following deficits Abnormal gait;Decreased activity tolerance;Decreased balance;Decreased endurance;Decreased knowledge of use of DME;Decreased mobility;Decreased strength;Decreased skin integrity;Dizziness;Impaired flexibility;Postural dysfunction;Prosthetic Dependency  Rehab Potential Good  PT Frequency 2x / week  PT Duration 12 weeks  PT Treatment/Interventions ADLs/Self Care Home Management;Canalith Repostioning;DME Instruction;Gait training;Stair training;Functional mobility training;Therapeutic activities;Therapeutic exercise;Balance training;Neuromuscular re-education;Patient/family education;Prosthetic Training;Manual techniques;Vestibular  PT Next Visit Plan work towards [place], high level balance activities and gait on compliant surfaces  Consulted and Agree with Plan of Care Patient         Patient will benefit from skilled therapeutic intervention in order to improve the following deficits and impairments:  Abnormal gait, Decreased activity tolerance, Decreased balance, Decreased endurance, Decreased knowledge of use of DME, Decreased mobility, Decreased strength, Decreased skin integrity, Dizziness, Impaired flexibility, Postural dysfunction, Prosthetic Dependency  Visit Diagnosis: Unsteadiness on feet  Other abnormalities of gait and mobility  Muscle weakness (generalized)  Abnormal posture     Problem List Patient Active Problem List   Diagnosis Date Noted  . Dehiscence of amputation stump ([place])   . History of right  below knee amputation ([place]) 11/08/2017  . Hypoalbuminemia due to protein-calorie malnutrition ([place])   . Urinary retention   . Acute blood loss anemia   . Status post below knee amputation, right ([place]) 10/20/2017  . Unilateral complete BKA ([place]) 10/20/2017  . Sepsis ([place]) 10/12/2017  . Diabetic foot ulcer ([place]) 10/12/2017  . Diabetic foot infection ([place]) 10/12/2017  . Hyponatremia 10/12/2017  . Chronic systolic CHF (congestive heart failure) ([place])   . Hyperkalemia 10/13/2015  . Congestive dilated cardiomyopathy ([place]) 07/13/2015  . Asymptomatic LV dysfunction 06/25/2015  . Poorly controlled diabetes mellitus ([place]) 05/31/2015  . HTN (hypertension), benign 07/04/2014  . Viral cardiomyopathy, EF improved to 45-50% 04/02/2013  . Obesity 04/02/2013  . Diabetic foot ulcer with osteomyelitis ([place]) 04/02/2013  . Hyperlipidemia 04/02/2013  . Sinus tachycardia 04/02/2013    Willow Ora, PTA, [facility] [address] [place], [place] [address] [phone] 08/19/18, 9:52 PM   Name: Brian Herring MRN: [id number] Date of Birth: 1955-08-21

## 2018-08-20 DIAGNOSIS — E1165 Type 2 diabetes mellitus with hyperglycemia: Secondary | ICD-10-CM | POA: Diagnosis not present

## 2018-08-20 DIAGNOSIS — I10 Essential (primary) hypertension: Secondary | ICD-10-CM | POA: Diagnosis not present

## 2018-08-20 DIAGNOSIS — I509 Heart failure, unspecified: Secondary | ICD-10-CM | POA: Diagnosis not present

## 2018-08-20 DIAGNOSIS — E1140 Type 2 diabetes mellitus with diabetic neuropathy, unspecified: Secondary | ICD-10-CM | POA: Diagnosis not present

## 2018-08-22 ENCOUNTER — Ambulatory Visit: Payer: PPO | Admitting: Physical Therapy

## 2018-08-24 ENCOUNTER — Ambulatory Visit: Payer: PPO | Admitting: Rehabilitation

## 2018-08-24 ENCOUNTER — Encounter: Payer: Self-pay | Admitting: Rehabilitation

## 2018-08-24 DIAGNOSIS — R2689 Other abnormalities of gait and mobility: Secondary | ICD-10-CM | POA: Diagnosis not present

## 2018-08-24 DIAGNOSIS — M6281 Muscle weakness (generalized): Secondary | ICD-10-CM

## 2018-08-24 DIAGNOSIS — R293 Abnormal posture: Secondary | ICD-10-CM

## 2018-08-24 DIAGNOSIS — R2681 Unsteadiness on feet: Secondary | ICD-10-CM

## 2018-08-24 NOTE — Patient Instructions (Signed)
Access Code: [email or web address]  URL: [email or web address]  Date: 08/24/2018  Prepared by: Cameron Sprang   Exercises  Sidelying Hip Abduction - 10 reps - 1 sets - 2x daily - 7x weekly  Sidelying Hip Abduction wtih Flexion and Extension (BKA) - 5 reps - 2 sets - 2x daily - 7x weekly  Figure 4 Bridge - 10 reps - 1 sets - 2x daily - 7x weekly  Supine Active Straight Leg Raise - 10 reps - 1 sets - 2x daily - 7x weekly

## 2018-08-24 NOTE — Therapy (Signed)
[facility] [address] [place], [place], [address] Phone: [phone]   Fax:  [phone]  Physical Therapy Treatment  Patient Details  Name: Brian Herring MRN: [id number] Date of Birth: 24-Aug-1955 Referring Provider (PT): Milas Gain, [place]   Encounter Date: 08/24/2018  PT End of Session - 08/24/18 1018    Visit Number  19    Number of Visits  25    Date for PT Re-Evaluation  09/03/18    Authorization Type  [employer]; $15 co-pay. $3400 oop & $160.49 applied pre-eval    PT Start Time  0925    PT Stop Time  1010    PT Time Calculation (min)  45 min    Equipment Utilized During Treatment  Gait belt    Activity Tolerance  Patient tolerated treatment well    Behavior During Therapy  [facility] for tasks assessed/performed       Past Medical History:  Diagnosis Date  . Arthritis    shoulder  . Cancer ([place])    skin cancer on nose  . Cardiomyopathy ([place])   . Chronic systolic CHF (congestive heart failure) ([place])   . Diabetes mellitus without complication ([place])   . Hyperlipidemia   . Hypertension     Past Surgical History:  Procedure Laterality Date  . AMPUTATION Right 10/14/2017   Procedure: AMPUTATION  GREAT TOE;  Surgeon: [name], MD;  Location: [facility];  Service: Orthopedics;  Laterality: Right;  . AMPUTATION Right 10/18/2017   Procedure: RIGHT BELOW KNEE AMPUTATION;  Surgeon: [name], MD;  Location: [facility];  Service: Orthopedics;  Laterality: Right;  . CYSTOSCOPY WITH INSERTION OF UROLIFT N/A 03/15/2018   Procedure: CYSTOSCOPY WITH INSERTION OF UROLIFT;  Surgeon: Franchot Gallo, MD;  [facility];  Service: Urology;  Laterality: N/A;  . SHOULDER SURGERY  01/05  . STUMP REVISION Right 12/15/2017   Procedure: REVISION RIGHT BELOW KNEE AMPUTATION;  Surgeon: [name], MD;  Location: [facility];  Service: Orthopedics;  Laterality: Right;    There were no vitals filed  for this visit.  Subjective Assessment - 08/24/18 1017    Subjective  Pt reports increased back pain and feels that leg is still short.     Pertinent History  R TTA, DM2, cardiomyopathy, tachycardia, CHF, HTN, obesity, HLD, arthritis,     Limitations  Lifting;Standing;House hold activities;Walking    Patient Stated Goals  Go back to work part-time doing something, walk with prosthesis, get back where I used to be    Currently in Pain?  No/denies                       [facility] Adult PT Treatment/Exercise - 08/24/18 1057      Ambulation/Gait   Ambulation/Gait  Yes    Ambulation/Gait Assistance  6: Modified independent (Device/Increase time)    Ambulation/Gait Assistance Details  Continue to assess gait indoors over indoor surfaces for alignment/sock issues.  Note medial lean and prosthesis seems to still be short despite having adjustment done last week.  Pt with increasing back pain with both indoor and outdoor gait initially, therefore had pt add 1 ply (for total of 7 ply).  This helped some but still seemed to be sinking too far into socket therefore removed both 1 ply and added 5 ply for total of 10 ply).  This provided decreased pain and improved support vs pt report.  Recommended he continue to adjust  socks as needed and do more walking to see if this helps.  Also still feel there is a component of hip weakness causing unlevel gait as well.  Educated pt to try adjusting socks over the weekend with walking and then if still feels off, contact [facility] and make appt before seeing Robin on Wednesday.  Pt verbalized understanding.     Ambulation Distance (Feet)  500 Feet   x 2 reps outdoors plus 200' and 150 indoors   Assistive device  Prosthesis;None    Gait Pattern  Step-through pattern;Decreased stride length;Trunk flexed;Decreased stance time - right    Ambulation Surface  Level;Unlevel;Indoor;Outdoor;Paved;Grass    Curb  6: Modified independent (Device/increase time)       Exercises   Exercises  Knee/Hip      Knee/Hip Exercises: Supine   Quad Sets  AROM;Strengthening;Right;1 set;10 reps    Single Leg Bridge  Strengthening;Right;1 set;10 reps   figure 4 position     Knee/Hip Exercises: Sidelying   Hip ABduction  Strengthening;Right;1 set;10 reps    Other Sidelying Knee/Hip Exercises  SL R hip abd with hip flex/extension x 10 reps      Prosthetics   Prosthetic Care Comments   Continue to assess for prosthesis being short.  He has had leg adjusted approx 1 week ago, however still feels it is short.  Pelvis feels mostly level in standing, however difficult due to noted medial lean.  Adjusted socks during session which seemed to assist.      Current prosthetic wear tolerance (days/week)   daily     Current prosthetic wear tolerance (#hours/day)   all awake hours, drying as needed    Education Provided  Correct ply sock adjustment    Person(s) Educated  Patient    Education Method  Explanation;Demonstration    Education Method  Verbalized understanding;Returned demonstration    Donning Prosthesis  Modified independent (device/increased time)    Doffing Prosthesis  Modified independent (device/increased time)          Access Code: [email or web address]  URL: [email or web address]  Date: 08/24/2018  Prepared by: Cameron Sprang   Exercises  Sidelying Hip Abduction - 10 reps - 1 sets - 2x daily - 7x weekly  Sidelying Hip Abduction wtih Flexion and Extension (BKA) - 5 reps - 2 sets - 2x daily - 7x weekly  Figure 4 Bridge - 10 reps - 1 sets - 2x daily - 7x weekly  Supine Active Straight Leg Raise - 10 reps - 1 sets - 2x daily - 7x weekly     PT Education - 08/24/18 1250    Education Details  see gait section     Person(s) Educated  Patient    Methods  Explanation    Comprehension  Verbalized understanding       PT Short Term Goals - 08/03/18 1029      PT SHORT TERM GOAL #1   Title  Patient verbalizes adjusting ply socks to accomodate limb volume  changes. (All updated STGs Target Date: 08/10/2018)    Baseline  08/03/18: met to date    Status  Achieved      PT SHORT TERM GOAL #2   Title  Patient verbalizes prosthesis wear >90% of awake hours with PT guidance on skin issues or limb discomfort.     Baseline  08/03/18: all awake hours    Status  Achieved      PT SHORT TERM GOAL #3   Title  Patient ambulates 500' outdoors on paved surfaces &  grass with cane & prosthesis with supervision.     Baseline  08/03/18: met today    Status  Achieved      PT SHORT TERM GOAL #4   Title  Patient ambulates 100' with prosthesis only with minA    Baseline  08/03/18: met today    Status  Achieved      PT SHORT TERM GOAL #5   Title  Patient negotiates ramps & curbs with cane & prosthesis with supervision.     Baseline  08/03/18: met today    Status  Achieved        PT Long Term Goals - 08/24/18 1019      PT LONG TERM GOAL #1   Title  Patient verbalizes & demonstrates proper prosthetic care to enable safe use of prosthesis. (All LTGs Target Date: 08/31/2018)    Baseline  met 08/24/18    Time  3    Status  Achieved      PT LONG TERM GOAL #2   Title  Patient tolerates prosthesis wear >90% of awake hours without skin issues or limb pain to enable function throughout his day.     Baseline  met 08/24/18    Status  Achieved      PT LONG TERM GOAL #3   Title  Berg Balance >45/56 to indicate low fall risk.     Baseline  48/56 on 08/10/2018    Time  3    Period  Months    Status  Achieved      PT LONG TERM GOAL #4   Title  Patient ambulates 500' outdoors including grass, ramps & curbs with cane or less & prosthesis modified independent for community mobility.     Time  3    Period  Months    Status  On-going      PT LONG TERM GOAL #5   Title  Patient able to perform lifting 25# boxes, carrying, pushing, pulling and climbing A-frame ladder to enable patient goals to return to yard work.     Time  3    Period  Months    Status  On-going             Plan - 08/24/18 1251    Clinical Impression Statement  Skilled session focused on continuing to assess gait due to pt reporting feeling that prosthesis is still short despite adjustment last week.  Did have patient adjust ply socks during session which seemed to help but recommend he follow back up with [facility] as his limb continues to shrink due to high activity.  Also continue to work on towards [place].  Pt making excellent progress.      Rehab Potential  Good    PT Frequency  2x / week    PT Duration  12 weeks    PT Treatment/Interventions  ADLs/Self Care Home Management;Canalith Repostioning;DME Instruction;Gait training;Stair training;Functional mobility training;Therapeutic activities;Therapeutic exercise;Balance training;Neuromuscular re-education;Patient/family education;Prosthetic Training;Manual techniques;Vestibular    PT Next Visit Plan  Shirlean Mylar I think he can D/C with you, I just wanted to make sure his leg was right.     Consulted and Agree with Plan of Care  Patient       Patient will benefit from skilled therapeutic intervention in order to improve the following deficits and impairments:  Abnormal gait, Decreased activity tolerance, Decreased balance, Decreased endurance, Decreased knowledge of use of DME, Decreased mobility, Decreased strength, Decreased skin integrity, Dizziness, Impaired flexibility, Postural dysfunction, Prosthetic Dependency  Visit  Diagnosis: Unsteadiness on feet  Other abnormalities of gait and mobility  Muscle weakness (generalized)  Abnormal posture     Problem List Patient Active Problem List   Diagnosis Date Noted  . Dehiscence of amputation stump ([place])   . History of right below knee amputation ([place]) 11/08/2017  . Hypoalbuminemia due to protein-calorie malnutrition ([place])   . Urinary retention   . Acute blood loss anemia   . Status post below knee amputation, right ([place]) 10/20/2017  . Unilateral complete BKA ([place]) 10/20/2017  .  Sepsis ([place]) 10/12/2017  . Diabetic foot ulcer ([place]) 10/12/2017  . Diabetic foot infection ([place]) 10/12/2017  . Hyponatremia 10/12/2017  . Chronic systolic CHF (congestive heart failure) ([place])   . Hyperkalemia 10/13/2015  . Congestive dilated cardiomyopathy ([place]) 07/13/2015  . Asymptomatic LV dysfunction 06/25/2015  . Poorly controlled diabetes mellitus ([place]) 05/31/2015  . HTN (hypertension), benign 07/04/2014  . Viral cardiomyopathy, EF improved to 45-50% 04/02/2013  . Obesity 04/02/2013  . Diabetic foot ulcer with osteomyelitis ([place]) 04/02/2013  . Hyperlipidemia 04/02/2013  . Sinus tachycardia 04/02/2013    Cameron Sprang, PT, MPT [facility] [address] [place], [place], [address] Phone: [phone]   Fax:  [phone] 08/24/18, 12:59 PM  Name: AUGIE VANE MRN: [id number] Date of Birth: 1955/09/09

## 2018-08-29 ENCOUNTER — Ambulatory Visit: Payer: PPO | Attending: Physician Assistant | Admitting: Physical Therapy

## 2018-08-29 ENCOUNTER — Encounter: Payer: Self-pay | Admitting: Physical Therapy

## 2018-08-29 DIAGNOSIS — R293 Abnormal posture: Secondary | ICD-10-CM | POA: Insufficient documentation

## 2018-08-29 DIAGNOSIS — Z89511 Acquired absence of right leg below knee: Secondary | ICD-10-CM | POA: Diagnosis not present

## 2018-08-29 DIAGNOSIS — R2681 Unsteadiness on feet: Secondary | ICD-10-CM | POA: Insufficient documentation

## 2018-08-29 DIAGNOSIS — M6281 Muscle weakness (generalized): Secondary | ICD-10-CM | POA: Diagnosis not present

## 2018-08-29 DIAGNOSIS — R2689 Other abnormalities of gait and mobility: Secondary | ICD-10-CM

## 2018-08-29 NOTE — Therapy (Signed)
[facility] [address] [place], [place], [address] Phone: [phone]   Fax:  [phone]  Physical Therapy Treatment & Discharge Summary  Patient Details  Name: Brian Herring MRN: [id number] Date of Birth: [birth year] Referring Provider (PT): Neta Mends [place], [place]  PHYSICAL THERAPY DISCHARGE SUMMARY  Visits from Start of Care: 20  Current functional level related to goals / functional outcomes: See below   Remaining deficits: See below   Education / Equipment: See below  Plan: Patient agrees to discharge.  Patient goals were met. Patient is being discharged due to meeting the stated rehab goals.  ?????       Encounter Date: 08/29/2018  PT End of Session - 08/29/18 1354    Visit Number  20    Number of Visits  25    Date for PT Re-Evaluation  09/03/18    Authorization Type  [employer]; $15 co-pay. $3400 oop & $160.49 applied pre-eval    PT Start Time  1015    PT Stop Time  1100    PT Time Calculation (min)  45 min    Equipment Utilized During Treatment  Gait belt    Activity Tolerance  Patient tolerated treatment well    Behavior During Therapy  WFL for tasks assessed/performed       Past Medical History:  Diagnosis Date  . Arthritis    shoulder  . Cancer ([place])    skin cancer on nose  . Cardiomyopathy ([place])   . Chronic systolic CHF (congestive heart failure) ([place])   . Diabetes mellitus without complication ([place])   . Hyperlipidemia   . Hypertension     Past Surgical History:  Procedure Laterality Date  . AMPUTATION Right 10/14/2017   Procedure: AMPUTATION  GREAT TOE;  Surgeon: [name], MD;  Location: [facility];  Service: Orthopedics;  Laterality: Right;  . AMPUTATION Right 10/18/2017   Procedure: RIGHT BELOW KNEE AMPUTATION;  Surgeon: [name], MD;  Location: [facility];  Service: Orthopedics;  Laterality: Right;  . CYSTOSCOPY WITH INSERTION OF UROLIFT N/A  03/15/2018   Procedure: CYSTOSCOPY WITH INSERTION OF UROLIFT;  Surgeon: Franchot Gallo, MD;  Location: [facility];  Service: Urology;  Laterality: N/A;  . SHOULDER SURGERY  01/05  . STUMP REVISION Right 12/15/2017   Procedure: REVISION RIGHT BELOW KNEE AMPUTATION;  Surgeon: [name], MD;  Location: [facility];  Service: Orthopedics;  Laterality: Right;    There were no vitals filed for this visit.  Subjective Assessment - 08/29/18 1015    Subjective  He is wearing prosthesis most of awake hours without issues. He has gone to [facility] 3-4 times without issues.     Pertinent History  R TTA, DM2, cardiomyopathy, tachycardia, CHF, HTN, obesity, HLD, arthritis,     Limitations  Lifting;Standing;House hold activities;Walking    Patient Stated Goals  Go back to work part-time doing something, walk with prosthesis, get back where I used to be    Currently in Pain?  No/denies         [facility] PT Assessment - 08/29/18 1015      Assessment   Medical Diagnosis  Right Transtibial Amputation    Referring Provider (PT)  Shawn Annamarie Dawley, PA      Transfers   Transfers  Sit to Stand;Stand to Sit    Sit to Stand  7: Independent;Without upper extremity assist;From chair/3-in-1    Stand to Sit  7: Independent;Without upper extremity assist;To chair/3-in-1  Ambulation/Gait   Ambulation/Gait  Yes    Ambulation/Gait Assistance  6: Modified independent (Device/Increase time)    Ambulation Distance (Feet)  600 Feet    Assistive device  Prosthesis;None    Gait Pattern  Step-through pattern;Decreased stance time - right    Ambulation Surface  Indoor;Level    Stairs  Yes    Stairs Assistance  6: Modified independent (Device/Increase time)    Stairs Assistance Details (indicate cue type and reason)  PT cued on alternate pattern technique with TTA prosthesis    Stair Management Technique  Two rails;One rail Left;One rail Right;Alternating pattern;Forwards    Number of Stairs  4   5 reps   Height of  Stairs  6    Ramp  6: Modified independent (Device)   prosthesis only   Curb  6: Modified independent (Device/increase time)   prosthesis only     Berg Balance Test   Sit to Stand  Able to stand without using hands and stabilize independently    Standing Unsupported  Able to stand safely 2 minutes    Sitting with Back Unsupported but Feet Supported on Floor or Stool  Able to sit safely and securely 2 minutes    Stand to Sit  Sits safely with minimal use of hands    Transfers  Able to transfer safely, minor use of hands    Standing Unsupported with Eyes Closed  Able to stand 10 seconds with supervision    Standing Ubsupported with Feet Together  Able to place feet together independently and stand 1 minute safely    From Standing, Reach Forward with Outstretched Arm  Can reach confidently >25 cm (10")    From Standing Position, Pick up Object from Floor  Able to pick up shoe safely and easily    From Standing Position, Turn to Look Behind Over each Shoulder  Looks behind from both sides and weight shifts well    Turn 360 Degrees  Able to turn 360 degrees safely but slowly    Standing Unsupported, Alternately Place Feet on Step/Stool  Able to stand independently and complete 8 steps >20 seconds    Standing Unsupported, One Foot in Front  Able to plae foot ahead of the other independently and hold 30 seconds    Standing on One Leg  Tries to lift leg/unable to hold 3 seconds but remains standing independently    Total Score  48    Berg comment:  Initial Berg was 21/56      Functional Gait  Assessment   Gait assessed   Yes    Gait Level Surface  Walks 20 ft in less than 5.5 sec, no assistive devices, good speed, no evidence for imbalance, normal gait pattern, deviates no more than 6 in outside of the 12 in walkway width.    Change in Gait Speed  Able to change speed, demonstrates mild gait deviations, deviates 6-10 in outside of the 12 in walkway width, or no gait deviations, unable to achieve a  major change in velocity, or uses a change in velocity, or uses an assistive device.    Gait with Horizontal Head Turns  Performs head turns smoothly with slight change in gait velocity (eg, minor disruption to smooth gait path), deviates 6-10 in outside 12 in walkway width, or uses an assistive device.    Gait with Vertical Head Turns  Performs task with slight change in gait velocity (eg, minor disruption to smooth gait path), deviates 6 - 10 in  outside 12 in walkway width or uses assistive device    Gait and Pivot Turn  Pivot turns safely in greater than 3 sec and stops with no loss of balance, or pivot turns safely within 3 sec and stops with mild imbalance, requires small steps to catch balance.    Step Over Obstacle  Is able to step over one shoe box (4.5 in total height) without changing gait speed. No evidence of imbalance.    Gait with Narrow Base of Support  Ambulates less than 4 steps heel to toe or cannot perform without assistance.    Gait with Eyes Closed  Walks 20 ft, uses assistive device, slower speed, mild gait deviations, deviates 6-10 in outside 12 in walkway width. Ambulates 20 ft in less than 9 sec but greater than 7 sec.    Ambulating Backwards  Walks 20 ft, uses assistive device, slower speed, mild gait deviations, deviates 6-10 in outside 12 in walkway width.    Steps  Alternating feet, must use rail.    Total Score  19      Prosthetics Assessment - 08/29/18 1015      Prosthetics   Prosthetic Care Independent with  Skin check;Residual limb care;Care of non-amputated limb;Prosthetic cleaning;Ply sock cleaning;Correct ply sock adjustment;Proper wear schedule/adjustment;Proper weight-bearing schedule/adjustment    Donning prosthesis   Independent    Doffing prosthesis   Independent    Current prosthetic wear tolerance (days/week)   daily     Current prosthetic wear tolerance (#hours/day)   all awake hours, drying as needed    Current prosthetic weight-bearing tolerance  (hours/day)   pt tolerates >30 minutes of intermittent standing without discomfort    Edema  none    Residual limb condition   normal color, temperature & moisture, cylinderical    K code/activity level with prosthetic use   K3 full community with variable cadence                  [facility] Adult PT Treatment/Exercise - 08/29/18 1015      Ambulation/Gait   Gait velocity  3.90 ft/sec prosthesis      Therapeutic Activites    Therapeutic Activities  Lifting    Lifting  Pt able to lift 25# box & carry 71' X 2 safely. He verbalizes push, pull & climbing ladder per previous instructions.       Prosthetics   Prosthetic Care Comments   follow-up with prosthetist             PT Education - 08/29/18 1050    Education Details  continue ongoing fitness plan. Add Silver Sneakers or low intensity classes after comfortable with machines.    Person(s) Educated  Patient    Methods  Explanation;Verbal cues    Comprehension  Verbalized understanding       PT Short Term Goals - 08/03/18 1029      PT SHORT TERM GOAL #1   Title  Patient verbalizes adjusting ply socks to accomodate limb volume changes. (All updated STGs Target Date: 08/10/2018)    Baseline  08/03/18: met to date    Status  Achieved      PT SHORT TERM GOAL #2   Title  Patient verbalizes prosthesis wear >90% of awake hours with PT guidance on skin issues or limb discomfort.     Baseline  08/03/18: all awake hours    Status  Achieved      PT SHORT TERM GOAL #3   Title  Patient ambulates 500'  outdoors on paved surfaces & grass with cane & prosthesis with supervision.     Baseline  08/03/18: met today    Status  Achieved      PT SHORT TERM GOAL #4   Title  Patient ambulates 100' with prosthesis only with minA    Baseline  08/03/18: met today    Status  Achieved      PT SHORT TERM GOAL #5   Title  Patient negotiates ramps & curbs with cane & prosthesis with supervision.     Baseline  08/03/18: met today    Status   Achieved        PT Long Term Goals - 08/29/18 1354      PT LONG TERM GOAL #1   Title  Patient verbalizes & demonstrates proper prosthetic care to enable safe use of prosthesis. (All LTGs Target Date: 08/31/2018)    Baseline  met 08/24/18    Time  3    Status  Achieved      PT LONG TERM GOAL #2   Title  Patient tolerates prosthesis wear >90% of awake hours without skin issues or limb pain to enable function throughout his day.     Baseline  met 08/24/18    Status  Achieved      PT LONG TERM GOAL #3   Title  Berg Balance >45/56 to indicate low fall risk.     Baseline  MET 08/29/2018 Merrilee Jansky 48/56    Time  3    Period  Months    Status  Achieved      PT LONG TERM GOAL #4   Title  Patient ambulates 500' outdoors including grass, ramps & curbs with cane or less & prosthesis modified independent for community mobility.     Baseline  MET 08/29/2018    Time  3    Period  Months    Status  Achieved      PT LONG TERM GOAL #5   Title  Patient able to perform lifting 25# boxes, carrying, pushing, pulling and climbing A-frame ladder to enable patient goals to return to yard work.     Baseline  MET 08/29/2018    Time  3    Period  Months    Status  Achieved            Plan - 08/29/18 1355    Clinical Impression Statement  Patient met all LTGs. He appears to be functioning at full community level with his transtibial amputation. He reports no issues with fitness program at [facility] utilizing PT instructions.     Rehab Potential  Good    PT Frequency  2x / week    PT Duration  12 weeks    PT Treatment/Interventions  ADLs/Self Care Home Management;Canalith Repostioning;DME Instruction;Gait training;Stair training;Functional mobility training;Therapeutic activities;Therapeutic exercise;Balance training;Neuromuscular re-education;Patient/family education;Prosthetic Training;Manual techniques;Vestibular    PT Next Visit Plan  discharge    Consulted and Agree with Plan of Care  Patient        Patient will benefit from skilled therapeutic intervention in order to improve the following deficits and impairments:  Abnormal gait, Decreased activity tolerance, Decreased balance, Decreased endurance, Decreased knowledge of use of DME, Decreased mobility, Decreased strength, Decreased skin integrity, Dizziness, Impaired flexibility, Postural dysfunction, Prosthetic Dependency  Visit Diagnosis: Unsteadiness on feet  Other abnormalities of gait and mobility  Muscle weakness (generalized)  Abnormal posture  Status post below knee amputation, right [facility])     Problem List Patient Active Problem  List   Diagnosis Date Noted  . Dehiscence of amputation stump ([place])   . History of right below knee amputation ([place]) 11/08/2017  . Hypoalbuminemia due to protein-calorie malnutrition ([place])   . Urinary retention   . Acute blood loss anemia   . Status post below knee amputation, right ([place]) 10/20/2017  . Unilateral complete BKA ([place]) 10/20/2017  . Sepsis ([place]) 10/12/2017  . Diabetic foot ulcer ([place]) 10/12/2017  . Diabetic foot infection ([place]) 10/12/2017  . Hyponatremia 10/12/2017  . Chronic systolic CHF (congestive heart failure) ([place])   . Hyperkalemia 10/13/2015  . Congestive dilated cardiomyopathy ([place]) 07/13/2015  . Asymptomatic LV dysfunction 06/25/2015  . Poorly controlled diabetes mellitus ([place]) 05/31/2015  . HTN (hypertension), benign 07/04/2014  . Viral cardiomyopathy, EF improved to 45-50% 04/02/2013  . Obesity 04/02/2013  . Diabetic foot ulcer with osteomyelitis ([place]) 04/02/2013  . Hyperlipidemia 04/02/2013  . Sinus tachycardia 04/02/2013    Ciel Chervenak PT, DPT 08/29/2018, 2:01 PM  [facility] [address] [place], [place], [address] Phone: [phone]   Fax:  [phone]  Name: Brian Herring MRN: [id number] Date of Birth: 09/02/1955

## 2018-08-31 ENCOUNTER — Ambulatory Visit: Payer: PPO | Admitting: Physical Therapy

## 2018-09-03 ENCOUNTER — Other Ambulatory Visit: Payer: Self-pay | Admitting: Pharmacy Technician

## 2018-09-03 ENCOUNTER — Ambulatory Visit: Payer: PPO | Admitting: Physical Therapy

## 2018-09-03 DIAGNOSIS — H2511 Age-related nuclear cataract, right eye: Secondary | ICD-10-CM | POA: Diagnosis not present

## 2018-09-03 DIAGNOSIS — E113411 Type 2 diabetes mellitus with severe nonproliferative diabetic retinopathy with macular edema, right eye: Secondary | ICD-10-CM | POA: Diagnosis not present

## 2018-09-03 DIAGNOSIS — E113412 Type 2 diabetes mellitus with severe nonproliferative diabetic retinopathy with macular edema, left eye: Secondary | ICD-10-CM | POA: Diagnosis not present

## 2018-09-03 DIAGNOSIS — H2512 Age-related nuclear cataract, left eye: Secondary | ICD-10-CM | POA: Diagnosis not present

## 2018-09-03 NOTE — Patient Outreach (Signed)
[facility]) Care Management  09/03/2018  LORENSO QUIRINO 1956/01/30 [id number]    Care coordination call placed to [employer] in regards to patient's application for Entresto.  Spoke to [place] who said the application was undergoing benefits verification. Inquired of Yong Channel how long that process normally takes as the application was sent in on 07/10/18. Yong Channel said that a determination should hopefully be made by the end of the month.  Will followup with [employer] in 10-14 business days to inquire if a determination has been made.  Harlow Carrizales P. Letrice Pollok, [place] Management [phone]

## 2018-09-05 ENCOUNTER — Encounter: Payer: PPO | Admitting: Physical Therapy

## 2018-09-10 ENCOUNTER — Other Ambulatory Visit: Payer: Self-pay | Admitting: Pharmacy Technician

## 2018-09-10 DIAGNOSIS — E113412 Type 2 diabetes mellitus with severe nonproliferative diabetic retinopathy with macular edema, left eye: Secondary | ICD-10-CM | POA: Diagnosis not present

## 2018-09-10 NOTE — Patient Outreach (Signed)
[facility]) Care Management  09/10/2018  FLEM ENDERLE [birth year] [id number]    Care coordination call placed to [employer] in regards to patient's [facility] application.  Spoke to [place] who said the application is still in the awaiting verification que. Erlene Quan informed that this process could take until April to complete but that patient's could continue to receive their refills during this time (patient is aware of this).  Will followup in approximately 14-21 business days to inquire on status of application.  Jermey Closs P. Kaisey Huseby, [place] Management [phone]

## 2018-09-15 DIAGNOSIS — S88119A Complete traumatic amputation at level between knee and ankle, unspecified lower leg, initial encounter: Secondary | ICD-10-CM | POA: Diagnosis not present

## 2018-09-19 ENCOUNTER — Other Ambulatory Visit: Payer: Self-pay | Admitting: Pharmacy Technician

## 2018-09-19 NOTE — Patient Outreach (Signed)
[place] [facility]) Care Management  09/19/2018  ALFREDDIE CONSALVO August 08, 1955 [id number]   Care coordination call placed to [employer] patient assistance in regards to patient's application for Entresto.  Spoke to [place] who informed that the provider needs to submit a PA for the [place] to [facility] by callin [phone]. The turn around time per [facility] for the determination to be made is 72 hours. Per Patty one a determination has been made then the result, whether approved or denied, needs to be faxed to [employer] at [phone] so that the application process can be completed.  Care coordination call made to Dr. Georgia Lopes office. The receptionist transferred me the voicemail of Valetta Fuller where a voicemail was left asking for a return call.  Will followup with provider's office in 2-3 business days if the call is not returned.  Nissim Fleischer P. Rudolpho Claxton, [place] Management [phone]

## 2018-09-24 ENCOUNTER — Other Ambulatory Visit: Payer: Self-pay | Admitting: Pharmacy Technician

## 2018-09-24 NOTE — Patient Outreach (Signed)
[facility]) Care Management  09/24/2018  GORGE ALMANZA 17-Jun-1956 [id number]    Care coordination fax made to [employer] patient assistance in regards to patient's application for Entresto.  Submitted PA letter to [employer] via fax  as required by patient assistance foundation. Per [employer] this letter was needed to complete the application process.  Will followup with [employer] in 3-5 business days to inquire if a determination has been made.  Kazuki Ingle P. Piercen Covino, [place] Management [phone]

## 2018-09-27 ENCOUNTER — Other Ambulatory Visit: Payer: Self-pay | Admitting: Pharmacy Technician

## 2018-09-27 NOTE — Patient Outreach (Signed)
[facility]) Care Management  09/27/2018  Brian Herring 1956-06-09 [id number]    Care coordination call placed to [employer] patient assistance in regards to patient's application for Entresto.  Spoke to [name] who informed that a determination has not been made. She informed that once information is faxed into [employer] that it can take 3 full business days to be attached to the patient's file. Patty suggested calling back next week.  Will followup with [employer] in 5-7 business days to see if a determination has been made.  Hilmer Aliberti P. Keny Donald, [place] Management [phone]

## 2018-09-28 ENCOUNTER — Other Ambulatory Visit: Payer: Self-pay | Admitting: Cardiology

## 2018-09-28 DIAGNOSIS — I509 Heart failure, unspecified: Secondary | ICD-10-CM

## 2018-10-04 ENCOUNTER — Other Ambulatory Visit: Payer: Self-pay | Admitting: Pharmacy Technician

## 2018-10-04 NOTE — Patient Outreach (Signed)
[facility]) Care Management  10/04/2018  JEREMIAH CURCI 01-07-1956 [id number]   ADDENDUM  Unsuccessful outreach call placed to patient in regards to patient assistance application with [employer] for [employer].  Unfortunately patient did not answer the phone, HIPAA compliant voicemail left.  Was calling to update patient on the status of his [employer] application.  Will followup with patient in 2-3 business days if call is not returned.  Taijah Macrae P. Mariangela Heldt, [place] Management [phone]

## 2018-10-04 NOTE — Patient Outreach (Signed)
[facility]) Care Management  10/04/2018  WILBUR OAKLAND 1955-10-09 [id number]   Care coordination call placed to [employer] patient assistance in regards to patient's application for Entresto.  Spoke to [place] who said patient was TEMPORARILY APPROVED 10/01/2018-12/30/2018. Mallie Mussel informed that the patient would still be available to receive refills during this time but in the meantime he also needs to apply to the Patient Access Network. Their phone number is [phone]. Mallie Mussel informed once the funds are exhausted from this program then they could reapply if the program is still open or they would have to reapply to [employer].  Will followup with patient and relay this information in order to get a plan going forward.  Ketra Duchesne P. Neshawn Aird, [place] Management [phone]

## 2018-10-08 ENCOUNTER — Other Ambulatory Visit: Payer: Self-pay | Admitting: Pharmacy Technician

## 2018-10-08 DIAGNOSIS — E113412 Type 2 diabetes mellitus with severe nonproliferative diabetic retinopathy with macular edema, left eye: Secondary | ICD-10-CM | POA: Diagnosis not present

## 2018-10-08 NOTE — Patient Outreach (Signed)
[place] [facility]) Care Management  10/08/2018  Brian Herring [birth year] [id number]    Incoming call received from patient, HIPAA identifiers verified.  Patient was returning my call from last week. Informed patient that he had temporarily been approved until June for [facility] through [employer]. Informed patient that he would have to apply thru the PAN foundation per phone call with Mallie Mussel at [employer]. Inquired if this was something the patient would be willing to apply to as it could be completed online with the information that the patient provided on the [employer] application. Patient informed that he would be willing to receive any help he could get. Informed patient if approved that he would be getting the medicine at his local pharmacy using a savings type card but that I would go into more detail if he was approved.  Will followup with patient in 5-7 business days to update patient.  Mariana Wiederholt P. Ercole Georg, [name] Management [phone]

## 2018-10-10 ENCOUNTER — Other Ambulatory Visit: Payer: Self-pay | Admitting: Pharmacy Technician

## 2018-10-10 NOTE — Patient Outreach (Signed)
[facility]) Care Management  10/10/2018  Brian Herring 03-31-1956 [id number]  Unsuccessful outreach call placed to patient. HIPAA compliant voicemail left.  Was calling to inform patient that he had been APPROVED for the PAN foundation for [facility] and to go over the program with him.  His billing information is as follow BIN# [id number] PCN# [id number] Group# [id number] ID# [id number]  Will followup with 2nd phone call to patient in 5-7 business days if call is not returned.  [name] P. Inika Bellanger, [place] Management [phone]

## 2018-10-12 ENCOUNTER — Other Ambulatory Visit: Payer: Self-pay | Admitting: Pharmacy Technician

## 2018-10-12 NOTE — Patient Outreach (Signed)
[place] [facility]) Care Management  10/12/2018  Brian Herring 1956-03-19 [id number]   Unsuccesful outreach call placed to patient. HPAA compliant voicemail left.  Was calling patient to inform that he had been approved for the PAN foundation for Entresto and to go over the program with him.  Will followup with 3rd phone call in 7-10 business days if call is not returned.  [name] P. Anita Laguna, [place] Management [phone]

## 2018-10-12 NOTE — Patient Outreach (Signed)
[facility]) Care Management  10/12/2018  Brian Herring 05-16-1956 [id number]   ADDENDUM  Incoming call received from Brian Herring, HIPAA identifiers verified.  Brian Herring was returning my call regarding his Brian Herring assistance for Entresto through [employer] and now [employer].  Informed Brian Herring that he was approved with [employer] only through December 30, 2018. We have also gotten him approved with the PAN foundation with an assistance amount of $1000.00. Mallie Mussel at [employer] informed that Brian Herring needed to apply for this program since [employer] Brian Herring assistance is a last resort option.  Informed Brian Herring he should be receiving a card in the mail within the next 10-14 business days. Informed Brian Herring to call me when he receives the card.Informed Brian Herring to take the card to his local pharmacy along with a prescription if one is not already current to have it filled. Informed Brian Herring that letter states "to ensure your grant remains active, you must request and receive payment for a claim from PAN within 120 days." Brian Herring verbalized understanding.  Will followup with Brian Herring in 14-21 business days if call has not been returned to followup with him about the PAN foundation.  Rondale Nies P. Breean Nannini, [place] Management [phone]

## 2018-10-16 DIAGNOSIS — E113411 Type 2 diabetes mellitus with severe nonproliferative diabetic retinopathy with macular edema, right eye: Secondary | ICD-10-CM | POA: Diagnosis not present

## 2018-10-16 DIAGNOSIS — E113412 Type 2 diabetes mellitus with severe nonproliferative diabetic retinopathy with macular edema, left eye: Secondary | ICD-10-CM | POA: Diagnosis not present

## 2018-10-24 DIAGNOSIS — S88119A Complete traumatic amputation at level between knee and ankle, unspecified lower leg, initial encounter: Secondary | ICD-10-CM | POA: Diagnosis not present

## 2018-10-26 ENCOUNTER — Other Ambulatory Visit: Payer: Self-pay | Admitting: Pharmacy Technician

## 2018-10-26 NOTE — Patient Outreach (Signed)
[facility]) Care Management  10/26/2018  Brian Herring June 05, 1956 [id number]  ADDENDUM  Successful outreach call placed to patient in regards to PAN foundation application for Entresto.  Spoke to patient, HIPAA identifiers verified.  Informed Brian Herring that [name] at [facility] was able to bill his [name] claim successfully and that it would be a zero dollar copay. Also informed Brian Herring that the medication would have to be ordered per [name] but that it would be in Monday October 29, 2018 after 5pm. Brian Herring informed me that was fine as he had enough medication to last. Informed Brian Herring to outreach me when he has picked up the medication.  Will followup with patient in 5-10 business days to confirm receipt of medicaiton.  Arieanna Pressey P. [name], [place] Management [phone]

## 2018-10-26 NOTE — Patient Outreach (Signed)
[facility]) Care Management  10/26/2018  Brian Herring Dec 22, 1955 [id number]  ADDENDUM  Care coordination call placed to [facility]. Spoke to [place].  Inquired if patient had an active order for [facility] which he did. Informed [name] that patient had been approved for assistance with this medication. Informed [name] to bill the claim to the primary insurance and then was able to provide her with the PAN foundation billing [employer]: [id number], PCN: [id number], ID: [id number], Group: [id number]). [name] was able to bill the claim correctly and it will be zero copay for 30 days supply. [name] informed they would have to order the medication and that it would be in after 5pm on Monday October 29, 2018.  Will outreach Brian Herring and update him with this information.  Malakhai Beitler P. Allayna Erlich, [place] Management [phone]

## 2018-10-26 NOTE — Patient Outreach (Signed)
[facility]) Care Management  10/26/2018  Brian Herring 03-20-1956 [id number]  Incoming call received from patient in regards to his PAN foundation application for [employer].  Spoke to Brian Herring, HIPAA identifiers verified. Brian Herring was calling to inform me that he had received his letter and copay card in the mail from the PAN foundation.  Informed Brian Herring that I would reach out to his pharmacy, [facility], on file to inquire if he had an active prescription for Entresto. If he does informed Brian Herring I would have them bill the claim to his primary insurance first and then to the PAN foundation card which should result in a zero copy for him. Informed him that I would outreach him once this has been completed so that he could pick up the medication as it has to once every 120 days to remain active in the program.  Plan:Call [facility] to give them billing information for [facility] and then followup back up with Brian Herring, [place] Management [phone]

## 2018-10-31 ENCOUNTER — Other Ambulatory Visit: Payer: Self-pay | Admitting: Cardiology

## 2018-10-31 ENCOUNTER — Other Ambulatory Visit: Payer: Self-pay | Admitting: Pharmacy Technician

## 2018-10-31 ENCOUNTER — Telehealth: Payer: Self-pay | Admitting: Pharmacist

## 2018-10-31 NOTE — Patient Outreach (Signed)
[facility]) Care Management  10/31/2018  Brian Herring 08-19-1955 [id number]  Patient's case is officially being closed as he received Entresto through the [employer] from his local pharmacy and has been approved through [employer].  Plan: Close patient's case Send closure letters to the patient and his provider.   [name], PharmD, [place] Clinical Pharmacist [phone]

## 2018-10-31 NOTE — Telephone Encounter (Signed)
-----   Message from Jason Fila, CPhT sent at 10/31/2018  3:44 PM EDT ----- fyi case closure

## 2018-10-31 NOTE — Patient Outreach (Signed)
[facility]) Care Management  10/31/2018  Brian Herring May 15, 1956 [id number]  Successful outreach call placed to patient in regards to his Entresto application with [employer]/PAN.  Spoke to patient, HIPAA identifiers verified.  Patient informed that he was able to pick up Entresto up at no charge from [place] today. He informed he also had a text message saying [employer] was sending out his [name] as well. He is temporarily approved with [employer] until June and has 1000 dollars on his PAN card. Patient understands he has to use the PAN card once every 120 days to remain active with the program.  Patient confirms he has our number and denies any other questions or concerns at the present time.  Will route note to [place] for case closure and remove myself from care team as patient assitance is completed.  Tawana Pasch P. Chrishonda Hesch, [place] Management [phone]

## 2018-11-12 DIAGNOSIS — E113412 Type 2 diabetes mellitus with severe nonproliferative diabetic retinopathy with macular edema, left eye: Secondary | ICD-10-CM | POA: Diagnosis not present

## 2018-11-15 DIAGNOSIS — E113411 Type 2 diabetes mellitus with severe nonproliferative diabetic retinopathy with macular edema, right eye: Secondary | ICD-10-CM | POA: Diagnosis not present

## 2018-11-20 ENCOUNTER — Encounter (INDEPENDENT_AMBULATORY_CARE_PROVIDER_SITE_OTHER): Payer: Self-pay | Admitting: Orthopedic Surgery

## 2018-11-20 ENCOUNTER — Ambulatory Visit (INDEPENDENT_AMBULATORY_CARE_PROVIDER_SITE_OTHER): Payer: PPO | Admitting: Orthopedic Surgery

## 2018-11-20 ENCOUNTER — Other Ambulatory Visit: Payer: Self-pay

## 2018-11-20 VITALS — Ht 72.0 in | Wt 223.0 lb

## 2018-11-20 DIAGNOSIS — Z89511 Acquired absence of right leg below knee: Secondary | ICD-10-CM | POA: Diagnosis not present

## 2018-11-20 NOTE — Progress Notes (Signed)
Office Visit Note   Patient: Brian Herring           Date of Birth: [birth year]           MRN: [id number] Visit Date: 11/20/2018              Requested by: [name], [place], [place] [address] PCP: [name], MD  Chief Complaint  Patient presents with  . Right Leg - Follow-up    12/15/17 revision right BKA       HPI: Patient is a 63 year old gentleman who presents in follow-up he is about a year out from his right transtibial amputation.  Patient states he has significant decreased volume in the residual limb he states he is wearing over 18 ply sock and does not have rotational stability due to the looseness of the fitting.  Patient states that he is very active with his prosthetic limb.  Patient denies any restrictions and states that he can climb up a ladder and can ride his lawnmower.  Assessment & Plan: Visit Diagnoses:  1. Status post below knee amputation, right [facility])     Plan: Patient was given a prescription for [employer] for a new socket new liner materials and supplies.  Follow-Up Instructions: Return if symptoms worsen or fail to improve.   Ortho Exam  Patient is alert, oriented, no adenopathy, well-dressed, normal affect, normal respiratory effort. Examination patient has no ulcers or calluses on the residual limb.  The socket is loose and does not have rotational support.  Patient is at risk of falling due to the looseness of the fitting.  Imaging: No results found. No images are attached to the encounter.  Labs: Lab Results  Component Value Date   HGBA1C 7.0 (H) 03/12/2018   HGBA1C 6.9 (H) 12/15/2017   HGBA1C 8.7 (H) 10/12/2017   ESRSEDRATE 125 (H) 10/12/2017   CRP 32.4 (H) 10/12/2017   REPTSTATUS 10/25/2017 FINAL 10/24/2017   GRAMSTAIN  10/14/2017    RARE WBC PRESENT, PREDOMINANTLY PMN FEW RARE GRAM POSITIVE COCCI IN PAIRS    CULT  10/24/2017    NO GROWTH Performed at [facility], [place] [address],  [place], [place] [address]      Lab Results  Component Value Date   ALBUMIN 3.5 01/08/2018   ALBUMIN 2.3 (L) 10/21/2017   ALBUMIN 3.4 (L) 10/12/2017   PREALBUMIN <5 (L) 10/12/2017    Body mass index is 30.24 kg/m.  Orders:  No orders of the defined types were placed in this encounter.  No orders of the defined types were placed in this encounter.    Procedures: No procedures performed  Clinical Data: No additional findings.  ROS:  All other systems negative, except as noted in the HPI. Review of Systems  Objective: Vital Signs: Ht 6' (1.829 m)   Wt 223 lb (101.2 kg)   BMI 30.24 kg/m   Specialty Comments:  No specialty comments available.  PMFS History: Patient Active Problem List   Diagnosis Date Noted  . Dehiscence of amputation stump ([place])   . History of right below knee amputation ([place]) 11/08/2017  . Hypoalbuminemia due to protein-calorie malnutrition ([place])   . Urinary retention   . Acute blood loss anemia   . Status post below knee amputation, right ([place]) 10/20/2017  . Unilateral complete BKA ([place]) 10/20/2017  . Sepsis ([place]) 10/12/2017  . Diabetic foot ulcer ([place]) 10/12/2017  . Diabetic foot infection ([place]) 10/12/2017  . Hyponatremia  10/12/2017  . Chronic systolic CHF (congestive heart failure) ([place])   . Hyperkalemia 10/13/2015  . Congestive dilated cardiomyopathy ([place]) 07/13/2015  . Asymptomatic LV dysfunction 06/25/2015  . Poorly controlled diabetes mellitus ([place]) 05/31/2015  . HTN (hypertension), benign 07/04/2014  . Viral cardiomyopathy, EF improved to 45-50% 04/02/2013  . Obesity 04/02/2013  . Diabetic foot ulcer with osteomyelitis ([place]) 04/02/2013  . Hyperlipidemia 04/02/2013  . Sinus tachycardia 04/02/2013   Past Medical History:  Diagnosis Date  . Arthritis    shoulder  . Cancer ([place])    skin cancer on nose  . Cardiomyopathy ([place])   . Chronic systolic CHF (congestive heart failure) ([place])   . Diabetes mellitus without complication ([place])    . Hyperlipidemia   . Hypertension     Family History  Problem Relation Age of Onset  . Pneumonia Mother        aspiration    Past Surgical History:  Procedure Laterality Date  . AMPUTATION Right 10/14/2017   Procedure: AMPUTATION  GREAT TOE;  Surgeon: [name], MD;  Location: [facility];  Service: Orthopedics;  Laterality: Right;  . AMPUTATION Right 10/18/2017   Procedure: RIGHT BELOW KNEE AMPUTATION;  Surgeon: [name], MD;  Location: [facility];  Service: Orthopedics;  Laterality: Right;  . CYSTOSCOPY WITH INSERTION OF UROLIFT N/A 03/15/2018   Procedure: CYSTOSCOPY WITH INSERTION OF UROLIFT;  Surgeon: Franchot Gallo, MD;  [facility];  Service: Urology;  Laterality: N/A;  . SHOULDER SURGERY  01/05  . STUMP REVISION Right 12/15/2017   Procedure: REVISION RIGHT BELOW KNEE AMPUTATION;  Surgeon: [name], MD;  Location: [facility];  Service: Orthopedics;  Laterality: Right;   Social History   Occupational History  . Not on file  Tobacco Use  . Smoking status: Never Smoker  . Smokeless tobacco: Never Used  Substance and Sexual Activity  . Alcohol use: No    Alcohol/week: 0.0 standard drinks  . Drug use: Never  . Sexual activity: Yes

## 2018-11-23 DIAGNOSIS — S88119A Complete traumatic amputation at level between knee and ankle, unspecified lower leg, initial encounter: Secondary | ICD-10-CM | POA: Diagnosis not present

## 2018-12-06 ENCOUNTER — Ambulatory Visit (INDEPENDENT_AMBULATORY_CARE_PROVIDER_SITE_OTHER): Payer: PPO

## 2018-12-06 ENCOUNTER — Other Ambulatory Visit (HOSPITAL_COMMUNITY): Payer: Self-pay | Admitting: Cardiology

## 2018-12-06 ENCOUNTER — Other Ambulatory Visit: Payer: Self-pay

## 2018-12-06 DIAGNOSIS — I509 Heart failure, unspecified: Secondary | ICD-10-CM

## 2018-12-06 DIAGNOSIS — I10 Essential (primary) hypertension: Secondary | ICD-10-CM | POA: Diagnosis not present

## 2018-12-07 LAB — BASIC METABOLIC PANEL
BUN/Creatinine Ratio: 24 (ref 10–24)
BUN: 26 mg/dL (ref 8–27)
CO2: 23 mmol/L (ref 20–29)
Calcium: 10.3 mg/dL — ABNORMAL HIGH (ref 8.6–10.2)
Chloride: 99 mmol/L (ref 96–106)
Creatinine, Ser: 1.08 mg/dL (ref 0.76–1.27)
GFR calc Af Amer: 85 mL/min/{1.73_m2} (ref >59)
GFR calc non Af Amer: 73 mL/min/{1.73_m2} (ref >59)
Glucose: 200 mg/dL — ABNORMAL HIGH (ref 65–99)
Potassium: 5.1 mmol/L (ref 3.5–5.2)
Sodium: 137 mmol/L (ref 134–144)

## 2018-12-11 DIAGNOSIS — E113412 Type 2 diabetes mellitus with severe nonproliferative diabetic retinopathy with macular edema, left eye: Secondary | ICD-10-CM | POA: Diagnosis not present

## 2018-12-13 DIAGNOSIS — E113411 Type 2 diabetes mellitus with severe nonproliferative diabetic retinopathy with macular edema, right eye: Secondary | ICD-10-CM | POA: Diagnosis not present

## 2018-12-14 ENCOUNTER — Encounter: Payer: Self-pay | Admitting: Cardiology

## 2018-12-14 ENCOUNTER — Other Ambulatory Visit: Payer: Self-pay

## 2018-12-14 ENCOUNTER — Ambulatory Visit (INDEPENDENT_AMBULATORY_CARE_PROVIDER_SITE_OTHER): Payer: PPO | Admitting: Cardiology

## 2018-12-14 VITALS — BP 134/74 | HR 78 | Ht 72.0 in | Wt 245.6 lb

## 2018-12-14 DIAGNOSIS — I428 Other cardiomyopathies: Secondary | ICD-10-CM | POA: Diagnosis not present

## 2018-12-14 DIAGNOSIS — N183 Chronic kidney disease, stage 3 (moderate): Secondary | ICD-10-CM | POA: Diagnosis not present

## 2018-12-14 DIAGNOSIS — I129 Hypertensive chronic kidney disease with stage 1 through stage 4 chronic kidney disease, or unspecified chronic kidney disease: Secondary | ICD-10-CM | POA: Diagnosis not present

## 2018-12-14 DIAGNOSIS — I10 Essential (primary) hypertension: Secondary | ICD-10-CM | POA: Insufficient documentation

## 2018-12-14 DIAGNOSIS — E1165 Type 2 diabetes mellitus with hyperglycemia: Secondary | ICD-10-CM | POA: Diagnosis not present

## 2018-12-14 NOTE — Progress Notes (Signed)
Virtual Visit via Video Note   Subjective:   Brian Herring, male    DOB: [birth year], 63 y.o.   MRN: [id number]    Chief complaint:  Cardiomyopathy   HPI  63 year old Caucasian male with hypertension, uncontrolled type 2 DM, s/p Rt transtibial amputation (2019) for osteomyelitis, nonischemic cardiomyopathy, CKD stage 3-4.  At last visit, I increased his metoprolol succinate to 37.5 mg daily, continued spironolactone to 25 mg daily, Entresto 97-103 mg bid, corlanor 5 mg bid.  Recent echocardiogram in 11/2018 shows LVED improved to 40%. He has been complaint with his medical therapy. He has had steady improvement in his blood sugars, with last A1C 8.4%. He has an appt with his PCP Dr. Harrington Challenger next week.  He is walking up to 30 min wearing his right prosthetic leg. He has a wound on left shun for which he is seeing Dr. Harrington Challenger.   Past Medical History:  Diagnosis Date  . Arthritis    shoulder  . Cancer ([place])    skin cancer on nose  . Cardiomyopathy ([place])   . Chronic systolic CHF (congestive heart failure) ([place])   . Diabetes mellitus without complication ([place])   . Hyperlipidemia   . Hypertension      Past Surgical History:  Procedure Laterality Date  . AMPUTATION Right 10/14/2017   Procedure: AMPUTATION  GREAT TOE;  Surgeon: [name], MD;  Location: [facility];  Service: Orthopedics;  Laterality: Right;  . AMPUTATION Right 10/18/2017   Procedure: RIGHT BELOW KNEE AMPUTATION;  Surgeon: [name], MD;  Location: [facility];  Service: Orthopedics;  Laterality: Right;  . CYSTOSCOPY WITH INSERTION OF UROLIFT N/A 03/15/2018   Procedure: CYSTOSCOPY WITH INSERTION OF UROLIFT;  Surgeon: Franchot Gallo, MD;  [facility];  Service: Urology;  Laterality: N/A;  . SHOULDER SURGERY  01/05  . STUMP REVISION Right 12/15/2017   Procedure: REVISION RIGHT BELOW KNEE AMPUTATION;  Surgeon: [name], MD;  Location: [facility];  Service: Orthopedics;  Laterality: Right;     Social History   Socioeconomic History  . Marital status: Single    Spouse name: Not on file  . Number of children: Not on file  . Years of education: Not on file  . Highest education level: Not on file  Occupational History  . Not on file  Social Needs  . Financial resource strain: Not on file  . Food insecurity:    Worry: Not on file    Inability: Not on file  . Transportation needs:    Medical: Not on file    Non-medical: Not on file  Tobacco Use  . Smoking status: Never Smoker  . Smokeless tobacco: Never Used  Substance and Sexual Activity  . Alcohol use: No    Alcohol/week: 0.0 standard drinks  . Drug use: Never  . Sexual activity: Yes  Lifestyle  . Physical activity:    Days per week: Not on file    Minutes per session: Not on file  . Stress: Not on file  Relationships  . Social connections:    Talks on phone: Not on file    Gets together: Not on file    Attends religious service: Not on file    Active member of club or organization: Not on file    Attends meetings of clubs or organizations: Not on file    Relationship status: Not on file  . Intimate partner violence:    Fear of current or ex partner: Not on  file    Emotionally abused: Not on file    Physically abused: Not on file    Forced sexual activity: Not on file  Other Topics Concern  . Not on file  Social History Narrative  . Not on file     Family History  Problem Relation Age of Onset  . Pneumonia Mother        aspiration     Current Outpatient Medications on File Prior to Visit  Medication Sig Dispense Refill  . acetaminophen (TYLENOL) 325 MG tablet Take 1-2 tablets (325-650 mg total) by mouth every 4 (four) hours as needed for mild pain.    . blood glucose meter kit and supplies KIT Dispense based on patient and insurance preference. Use up to four times daily as directed. (FOR ICD-10: E 11.65). 1 each 0  . furosemide (LASIX) 40 MG tablet Take 40 mg by mouth daily as needed for fluid or edema.     Marland Kitchen  glimepiride (AMARYL) 4 MG tablet Take 0.5 tablets (2 mg total) by mouth every morning. 30 tablet 11  . Insulin Glargine (LANTUS) 100 UNIT/ML Solostar Pen Inject 24 Units into the skin at bedtime. 15 mL 0  . Insulin Pen Needle (CAREFINE PEN NEEDLES) 31G X 6 MM MISC 1 application by Does not apply route daily. 100 each 0  . Lancets (ONETOUCH DELICA PLUS [id number]) MISC Use to test blood sugar daily  3  . metoprolol succinate (TOPROL-XL) 25 MG 24 hr tablet Take 1 tablet (25 mg total) by mouth every evening. 30 tablet 0  . mupirocin ointment (BACTROBAN) 2 % Apply to affected area three times daily for 5 days  0  . ONE TOUCH ULTRA TEST test strip Use to test blood sugar daily  1  . rosuvastatin (CRESTOR) 10 MG tablet TAKE 1 TABLET BY MOUTH DAILY 90 tablet 3  . sacubitril-valsartan (ENTRESTO) 97-103 MG Take 1 tablet by mouth 2 (two) times daily.    Marland Kitchen spironolactone (ALDACTONE) 25 MG tablet Take 1 tablet by mouth daily.  2  . tamsulosin (FLOMAX) 0.4 MG CAPS capsule Take 2 capsules (0.8 mg total) by mouth daily after supper. 90 capsule 0   No current facility-administered medications on file prior to visit.     Cardiovascular studies:  Echocardiogram 12/06/2018: Mildly depressed LV systolic function with EF 40%. Left ventricle cavity is normal in size. Mild concentric hypertrophy of the left ventricle. Mildly decreased global wall motion. Doppler evidence of grade I (impaired) diastolic dysfunction, normal LAP. Calculated EF 40%. No hemodynamically significant valvular abnormalities. Compared to previous study on 05/31/2018, there is significant improvement in LVEF.  ABI 06/21/2017:  This exam reveals normal perfusion of the lower extremity (ABI) with bilateral ABI 1.08 with triphasic waveform (normal).  Lexiscan myoview stress test 04/28/2017: 1. Abnormal pharmacologic stress nuclear study but negative for ischemia.     2. Low risk of hemodynamically significant coronary artery disease.  Prognostically, this is a high risk study. 3. Non diagnostic stress EKG due to pharmacologic stress.  4. Fixed perfusion defect due to diaphragmatic attenuation. The left ventricle is severely dilated. Gated SPECT imaging demonstrates severe global hypokinesis. The gated study showed the ejection fraction was 10-15%. Findings consistent with non-ischemic  cardiomyopathy.  Recent labs: Results for Mcbreen, SHONDALE QUINLEY (MRN [id number]) as of 12/14/2018 14:06  Ref. Range 12/06/2018 56:97  BASIC METABOLIC PANEL Unknown Rpt (A)  Sodium Latest Ref Range: 134 - 144 mmol/L 137  Potassium Latest Ref Range: 3.5 - 5.2 mmol/L  5.1  Chloride Latest Ref Range: 96 - 106 mmol/L 99  CO2 Latest Ref Range: 20 - 29 mmol/L 23  Glucose Latest Ref Range: 65 - 99 mg/dL 200 (H)  BUN Latest Ref Range: 8 - 27 mg/dL 26  Creatinine Latest Ref Range: 0.76 - 1.27 mg/dL 1.08  Calcium Latest Ref Range: 8.6 - 10.2 mg/dL 10.3 (H)  BUN/Creatinine Ratio Latest Ref Range: 10 - 24  24  GFR, Est Non African American Latest Ref Range: >59 mL/min/1.73 73  GFR, Est African American Latest Ref Range: >59 mL/min/1.73 85   Results for Caissie, BURREL LEGRAND (MRN [id number]) as of 12/14/2018 14:06  Ref. Range 03/12/2018 14:30  WBC Latest Ref Range: 4.0 - 10.5 K/uL 7.3  RBC Latest Ref Range: 4.22 - 5.81 MIL/uL 4.32  Hemoglobin Latest Ref Range: 13.0 - 17.0 g/dL 11.1 (L)  HCT Latest Ref Range: 39.0 - 52.0 % 35.4 (L)  MCV Latest Ref Range: 78.0 - 100.0 fL 81.9  MCH Latest Ref Range: 26.0 - 34.0 pg 25.7 (L)  MCHC Latest Ref Range: 30.0 - 36.0 g/dL 31.4  RDW Latest Ref Range: 11.5 - 15.5 % 14.3  Platelets Latest Ref Range: 150 - 400 K/uL 345   Results for Loja, HOYT LEANOS (MRN [id number]) as of 12/14/2018 14:06  Ref. Range 12/15/2017 08:38 01/08/2018 19:30 03/12/2018 14:30 12/06/2018 10:24  Glucose Latest Ref Range: 65 - 99 mg/dL 147 (H) 176 (H) 124 (H) 200 (H)  Hemoglobin A1C Latest Ref Range: 4.8 - 5.6 % 6.9 (H)  7.0 (H)     Review of Systems   Constitution: Negative for decreased appetite, malaise/fatigue, weight gain and weight loss.  HENT: Negative for congestion.   Eyes: Negative for visual disturbance.  Cardiovascular: Negative for chest pain, dyspnea on exertion, leg swelling, palpitations and syncope.  Respiratory: Negative for cough.   Endocrine: Negative for cold intolerance.  Hematologic/Lymphatic: Does not bruise/bleed easily.  Skin: Negative for itching and rash.       Left shin wound. Dressing in place.  Musculoskeletal: Negative for myalgias.  Gastrointestinal: Negative for abdominal pain, nausea and vomiting.  Genitourinary: Negative for dysuria.  Neurological: Negative for dizziness and weakness.  Psychiatric/Behavioral: The patient is not nervous/anxious.   All other systems reviewed and are negative.        Vitals:   12/14/18 1020  BP: 134/74  Pulse: 78  SpO2: 97%   (Measured by the patient using a home BP monitor)   Observation/findings during video visit   Objective:    Physical Exam  Constitutional: He is oriented to person, place, and time. He appears well-developed and well-nourished. No distress.  HENT:  Head: Normocephalic and atraumatic.  Eyes: Pupils are equal, round, and reactive to light. Conjunctivae are normal.  Neck: No JVD present.  Cardiovascular: Normal rate and regular rhythm.  Pulses:      Dorsalis pedis pulses are 2+ on the left side.       Posterior tibial pulses are 2+ on the left side.  Right prosthetic leg   Pulmonary/Chest: Effort normal and breath sounds normal. He has no wheezes. He has no rales.  Abdominal: Soft. Bowel sounds are normal. There is no rebound.  Musculoskeletal:        General: No edema.  Lymphadenopathy:    He has no cervical adenopathy.  Neurological: He is alert and oriented to person, place, and time. No cranial nerve deficit.  Skin: Skin is warm and dry.  Psychiatric: He has a normal mood and  affect.  Nursing note and vitals reviewed.          Assessment & Recommendations:   63 year old Caucasian male with hypertension, uncontrolled type 2 DM, s/p Rt transtibial amputation (2019) for osteomyelitis, nonischemic cardiomyopathy, CKD stage 3-4.  Nonischemic cardiomyopathy: EF improved to 40% in 11/2018. ContinueIncrease metoprolol succinate to 12.5 mg daily.(Patient taking differently than 37. Mg previously prescribed, yet good BP control noted) Continue spironolactone to 25 mg daily, Entresto 97-103 mg bid, corlanor 5 mg bid. Reviewed recent BMP and echocardiogram in 11/2018.  Hypertension: Controlled.  Type 2 DM: Continue follow up with PCP. I have given him Jardiance 10 mg daily samplex (14 pills) and encouraged to discuss with his PCP further, if Jardiance could be added to his baseline diabetes management.   H/o osetomyelitis: S/p right transtibial amputation Walking well with prosthetic leg.  I have encouraged the patient to follow up closely with his PCP re: Lt shin wound, especially given his h/o ostemoelytic in Rt foot.   CKD stage 3: Stable  I will see him back in 6 months   Lamar, MD [facility]. [facility] Pager: [phone] Office: [phone] If no answer Cell [phone]

## 2018-12-19 DIAGNOSIS — R609 Edema, unspecified: Secondary | ICD-10-CM | POA: Diagnosis not present

## 2018-12-19 DIAGNOSIS — G479 Sleep disorder, unspecified: Secondary | ICD-10-CM | POA: Diagnosis not present

## 2018-12-19 DIAGNOSIS — Z125 Encounter for screening for malignant neoplasm of prostate: Secondary | ICD-10-CM | POA: Diagnosis not present

## 2018-12-19 DIAGNOSIS — Z0000 Encounter for general adult medical examination without abnormal findings: Secondary | ICD-10-CM | POA: Diagnosis not present

## 2018-12-19 DIAGNOSIS — E1169 Type 2 diabetes mellitus with other specified complication: Secondary | ICD-10-CM | POA: Diagnosis not present

## 2018-12-19 DIAGNOSIS — E7800 Pure hypercholesterolemia, unspecified: Secondary | ICD-10-CM | POA: Diagnosis not present

## 2018-12-19 DIAGNOSIS — I420 Dilated cardiomyopathy: Secondary | ICD-10-CM | POA: Diagnosis not present

## 2018-12-19 DIAGNOSIS — I10 Essential (primary) hypertension: Secondary | ICD-10-CM | POA: Diagnosis not present

## 2018-12-19 DIAGNOSIS — Z89511 Acquired absence of right leg below knee: Secondary | ICD-10-CM | POA: Diagnosis not present

## 2018-12-20 ENCOUNTER — Other Ambulatory Visit: Payer: Self-pay | Admitting: Pharmacy Technician

## 2018-12-20 NOTE — Patient Outreach (Signed)
[facility]) Care Management  12/20/2018  Brian Herring 10-Jun-1956 [id number]    Incoming call received from patient in regards to requesting help for patient assistance for Jardiance.  Spoke to patient, HIPAA identifiers verified.  Patient informed he went to the provider's office yesterday, Dr. Harrington Challenger, and the provider put him on Jardiance and is going to stop his insulin.  Patient called to inquire if we could help him with the cost of the medication like we did his Entresto and [name] earlier in the year.  Will outreach [name] for assistance and advice as patient's provider should have an embedded pharmacist on staff from [facility]. Jadyn Brasher, [place] Management [phone]

## 2018-12-21 DIAGNOSIS — Z89511 Acquired absence of right leg below knee: Secondary | ICD-10-CM | POA: Diagnosis not present

## 2018-12-24 ENCOUNTER — Telehealth: Payer: Self-pay

## 2018-12-24 DIAGNOSIS — S88119A Complete traumatic amputation at level between knee and ankle, unspecified lower leg, initial encounter: Secondary | ICD-10-CM | POA: Diagnosis not present

## 2018-12-24 MED ORDER — SPIRONOLACTONE 25 MG PO TABS: 25.0000 mg | ORAL_TABLET | Freq: Every day | ORAL | 2 refills | Status: DC

## 2018-12-24 NOTE — Telephone Encounter (Signed)
Pt called stating that the pharmacy filled his spironolactone 1/2 tab qd but he thought you said to take a whole tab and that's whats in the chart. Please clarify.//ah

## 2018-12-24 NOTE — Telephone Encounter (Signed)
Pt aware.//ah

## 2018-12-26 ENCOUNTER — Ambulatory Visit: Payer: Self-pay | Admitting: Pharmacist

## 2018-12-26 ENCOUNTER — Other Ambulatory Visit: Payer: Self-pay | Admitting: Pharmacist

## 2018-12-27 NOTE — Patient Outreach (Addendum)
[facility]) Care Management  12/27/2018  Brian Herring 05/11/1956 [id number]   Patient was called to complete a medication review as he is receiving medication assistance services but has not had a medication review this year.  Unfortunately, patient did not answer the phone. HIPAA compliant message was left on his voicemail.  Plan: Await a call back from the patient. Call patient back in 7-10 business days. Send patient an unsuccessful Economist.   [name], PharmD, [place] Clinical Pharmacist [phone]

## 2019-01-08 ENCOUNTER — Ambulatory Visit: Payer: Self-pay | Admitting: Pharmacist

## 2019-01-08 ENCOUNTER — Other Ambulatory Visit: Payer: Self-pay | Admitting: Pharmacist

## 2019-01-08 NOTE — Addendum Note (Signed)
Addended by: [name] on: 01/08/2019 11:28 AM   Modules accepted: Orders

## 2019-01-08 NOTE — Patient Outreach (Addendum)
[facility]) Care Management  01/08/2019  Brian Herring 10-Jul-1956 [id number]   Patient was called to complete a medication review as he is receiving medication assistance services but has not had a medication review this year.  Unfortunately, patient did not answer the phone. HIPAA compliant message was left on his voicemail.Today's call was the 2nd unsuccessful call.  Plan: Await a call back from the patient. Call patient back in 6-8 weeks (since he is already receiving care).    [name], PharmD, BCACP [facility] Clinical Pharmacist [phone]   [facility]) Care Management  [place]   01/08/2019  Brian Herring [birth year] [id number]  Reason for referral: medication assistance  Referral source: Susy Frizzle, CPhT Referral medication(s): Jardiance Current insurance:  HTA   HPI:  CHF, type 2 diabetes, history of diabetic foot infection with secondary leg amputation, hypertension, hyperlipidemia, Obesity and urinary retention. Patient reported his provider recently started him on Jardiance and he wondered if there was a patient assistance program to help with the cost.     Objective: Allergies  Allergen Reactions  . Sulfur Rash  . Norvasc [Amlodipine Besylate] Rash  . Sulfa Antibiotics Rash    Medications Reviewed Today    Reviewed by Nigel Mormon, MD (Physician) on 12/14/18 at 1413  Med List Status: <None>  Medication Order Taking? Sig Documenting Provider Last Dose Status Informant  acetaminophen (TYLENOL) 325 MG tablet [id number] Yes Take 1-2 tablets (325-650 mg total) by mouth every 4 (four) hours as needed for mild pain. Bary Leriche, PA-C Taking Active Self  aspirin EC 81 MG tablet [id number] Yes Take 81 mg by mouth daily. [provider] Taking Active   blood glucose meter kit and supplies KIT [id number] Yes Dispense based on patient and insurance preference. Use up to four times  daily as directed. (FOR ICD-10: E 11.65). Bary Leriche, PA-C Taking Active Self  furosemide (LASIX) 40 MG tablet [id number] Yes Take 40 mg by mouth daily.  [provider] Taking Active Self  glimepiride (AMARYL) 4 MG tablet [id number] Yes Take 0.5 tablets (2 mg total) by mouth every morning. Bary Leriche, PA-C Taking Active Self  Insulin Glargine (LANTUS) 100 UNIT/ML Solostar Pen [id number] Yes Inject 24 Units into the skin at bedtime. Bary Leriche, PA-C Taking Active Self  Insulin Pen Needle (CAREFINE PEN NEEDLES) 31G X 6 MM MISC [id number] Yes 1 application by Does not apply route daily. Bary Leriche, PA-C Taking Active Self  ivabradine ([name]) 5 MG TABS tablet [id number] Yes Take 5 mg by mouth 2 (two) times daily with a meal. [provider] Taking Active   Lancets (ONETOUCH DELICA PLUS [id number]) [place] [id number] Yes Use to test blood sugar daily [provider] Taking Active   metoprolol succinate (TOPROL-XL) 25 MG 24 hr tablet [id number] Yes Take 1 tablet (25 mg total) by mouth every evening.  Patient taking differently: Take 12.5 mg by mouth every evening.    Bary Leriche, PA-C Taking Active Self  mupirocin ointment (BACTROBAN) 2 % [id number] Yes Apply to affected area three times daily for 5 days [provider] Taking Active   ONE TOUCH ULTRA TEST test strip [id number] Yes Use to test blood sugar daily [provider] Taking Active   rosuvastatin (CRESTOR) 10 MG tablet [id number] Yes TAKE 1 TABLET BY MOUTH DAILY Adrian Prows, MD Taking Active   sacubitril-valsartan [facility]) 97-103 MG [id number] Yes Take 1 tablet by mouth  2 (two) times daily. [provider] Taking Active Self  spironolactone (ALDACTONE) 25 MG tablet [id number] Yes Take 1 tablet by mouth daily. Nigel Mormon, MD Taking Active           Assessment:  Drugs sorted by system:  Cardiovascular: Entresto, Aspirin, Furosemide, Metoprolol, [name], Spironolactone,  rosuvastatin  Endocrine: Glimepiride, Jardiance,  Topical: Mupirocin  Pain: Acetaminophen  Medication Review Findings:  . Patient reported his HgA1c is 9%. He said his provider discontinued Lantus and started Jardiance. It may be prudent to watch his A1c and if it does not come down to restart low dose Lantus at bedtime.  Medication Assistance Findings:  Medication assistance needs identified: Jardiance     Additional medication assistance options reviewed with patient as warranted:  No other options identified  Plan: I will route patient assistance letter to [place] technician who will coordinate patient assistance program application process for medications listed above.  [facility] pharmacy technician will assist with obtaining all required documents from both patient and provider(s) and submit application(s) once completed.

## 2019-01-10 ENCOUNTER — Encounter: Payer: Self-pay | Admitting: Pharmacist

## 2019-01-10 ENCOUNTER — Other Ambulatory Visit: Payer: Self-pay | Admitting: Pharmacy Technician

## 2019-01-10 DIAGNOSIS — H2511 Age-related nuclear cataract, right eye: Secondary | ICD-10-CM | POA: Diagnosis not present

## 2019-01-10 DIAGNOSIS — E113412 Type 2 diabetes mellitus with severe nonproliferative diabetic retinopathy with macular edema, left eye: Secondary | ICD-10-CM | POA: Diagnosis not present

## 2019-01-10 DIAGNOSIS — E113411 Type 2 diabetes mellitus with severe nonproliferative diabetic retinopathy with macular edema, right eye: Secondary | ICD-10-CM | POA: Diagnosis not present

## 2019-01-10 NOTE — Patient Outreach (Signed)
[facility]) Care Management  01/10/2019  Brian Herring September 01, 1955 [id number]                          Medication Assistance Referral  Referral From: [place]  Medication/Company: [name] / BI Patient application portion:  Mailed Provider application portion: Faxed  to [name]   Follow up:  Will follow up with patient in 5-10 business days to confirm application(s) have been received.  [name] P. Roza Creamer, [place] Management [phone]

## 2019-01-16 DIAGNOSIS — E113412 Type 2 diabetes mellitus with severe nonproliferative diabetic retinopathy with macular edema, left eye: Secondary | ICD-10-CM | POA: Diagnosis not present

## 2019-01-17 ENCOUNTER — Other Ambulatory Visit: Payer: Self-pay | Admitting: Pharmacy Technician

## 2019-01-17 NOTE — Patient Outreach (Signed)
[facility]) Care Management  01/17/2019  Brian Herring 1955/09/14 [id number]    Incoming call received from patient in regards to [facility] application for Jardiance.  Spoke to patient, HIPAA identifiers verified.  Patient was calling with questions concerning his application. Discussed application process with patient. Patient verbalized understanding and said he would place the application in the mail.  Patient also verbalized that he was in need of a refill of his Glimepiride as the provider increased his dose from 1/2 tablet to 1 tablet qd. Patient informed his pharmacy said the insuranace would not pay for the medication because the refill was too soon.  Care coordination call placed to [facility]. Spoke to a pharmacy representative who informed they had the updated script of 1 tablet per day. However, even after trying an override, the medicaton would not be able to be filled until 01/22/2019 on insurance. Inquired if patient could purchase a 5 days supply until the insurance could cover the medication and she replied that patient could purchase the smaller amount and that a 5 days supply would cost around $10.00 or less. She also informed patient would have to call in for the refill on 01/22/2019 and request the full 90 days supply.  Call placed to Brian Herring, HIPAA identifiers verified. Informed patient of the information provided by [facility] and patient verbalized understanding and said he would go by the pharmacy today and request the smaller supply and then pick up the regular 90 days supply on 6/30.  Informed patient I would be awaiting the return of his application and would keep him abreast of the determination by BI.  Will followup with patient in 10-14 business days if application has not been received.  [name] P. Aamir Mclinden, [place] Management [phone]

## 2019-01-22 ENCOUNTER — Other Ambulatory Visit: Payer: Self-pay | Admitting: Pharmacy Technician

## 2019-01-22 NOTE — Patient Outreach (Signed)
[place] [facility]) Care Management  01/22/2019  Brian Herring 1955/10/17 [id number]    Received all necessary documents and signatures for BI patient assistance for Jardiance.  Submitted completed application via fax to [facility].  Will followup with BI in 7-10 business days to inquire about status of application.  Afsa Meany P. Khalani Novoa, [place] Management [phone]

## 2019-01-23 DIAGNOSIS — S88119A Complete traumatic amputation at level between knee and ankle, unspecified lower leg, initial encounter: Secondary | ICD-10-CM | POA: Diagnosis not present

## 2019-01-30 ENCOUNTER — Other Ambulatory Visit: Payer: Self-pay | Admitting: Pharmacy Technician

## 2019-01-30 NOTE — Patient Outreach (Signed)
[place] [facility]) Care Management  01/30/2019  VICENT FEBLES 11-16-1955 [id number]  Care coordination call placed to BI cares in regards to patient's application for Jardiance.  Spoke to Manuela Schwartz who informed patient had been APPROVED 01/29/2019-07/25/2019. Manuela Schwartz informed 90 days supply of medication was shipped today 01/30/2019 and that patient could expect the shipment in 7-10 business days.  Will followup with patient in 10-14 business days to inquire if he has received the medication.  Katy Brickell P. Karel Mowers, [place] Management [phone]

## 2019-01-30 NOTE — Patient Outreach (Signed)
[facility]) Care Management  01/30/2019  ZACHRY HOPFENSPERGER April 28, 1956 [id number]   Incoming call received from patient in regards to [facility] application forJardiance.  Spoke to patient, HIPAA identifiers verified.  Patient was calling to get an update on his patient assistance application as he only has 1 days supply of medication remaining.  Informed patient he was approved yesterday and his medication shipped today. Informed patient to either check with the provider's office for samples or purchase 1 month supply from the pharmacy to avoid running out of medication. Patient verbalized understanding.  Will followup with patient in 10-14 business days.  Deniah Saia P. Brigg Cape, [place] Management [phone]

## 2019-02-14 DIAGNOSIS — E113411 Type 2 diabetes mellitus with severe nonproliferative diabetic retinopathy with macular edema, right eye: Secondary | ICD-10-CM | POA: Diagnosis not present

## 2019-02-15 ENCOUNTER — Other Ambulatory Visit: Payer: Self-pay | Admitting: Pharmacy Technician

## 2019-02-15 ENCOUNTER — Other Ambulatory Visit: Payer: Self-pay | Admitting: Pharmacist

## 2019-02-15 NOTE — Patient Outreach (Signed)
[facility]) Care Management  02/15/2019  SNEIJDER BERNARDS [birth year] [id number]   Patient's case is being closed as he received a 90 day supply of Jardiance from patient assistance programming.   Plan: Send closure letters to patient and his PCP Will gladly reopen patient's case of pharmacy needs in the future.   [name], PharmD, [place] Clinical Pharmacist [phone]

## 2019-02-15 NOTE — Patient Outreach (Signed)
[facility]) Care Management  02/15/2019  Brian Herring 1956/06/20 [id number]  Successful outreach call placed to patient in regards to [facility] application for Jardiance.  Spoke to patient, HIPAA identifiers verified.  Patient informed he did receive 90 day supply of Jardiance in the mail. Discussed refill procedure with patient and he verbalized understanding. Patient informed he had no further questions relating to medication or patient assistance. Confirmed patient had name and number.  Will route note to [place] that patient assistance has been completed and will remove myself from care team.  Brian Herring. Brian Herring, [place] Management [phone]

## 2019-02-20 DIAGNOSIS — E113412 Type 2 diabetes mellitus with severe nonproliferative diabetic retinopathy with macular edema, left eye: Secondary | ICD-10-CM | POA: Diagnosis not present

## 2019-02-20 DIAGNOSIS — E113411 Type 2 diabetes mellitus with severe nonproliferative diabetic retinopathy with macular edema, right eye: Secondary | ICD-10-CM | POA: Diagnosis not present

## 2019-02-20 DIAGNOSIS — H2512 Age-related nuclear cataract, left eye: Secondary | ICD-10-CM | POA: Diagnosis not present

## 2019-02-20 DIAGNOSIS — H2511 Age-related nuclear cataract, right eye: Secondary | ICD-10-CM | POA: Diagnosis not present

## 2019-02-23 DIAGNOSIS — S88119A Complete traumatic amputation at level between knee and ankle, unspecified lower leg, initial encounter: Secondary | ICD-10-CM | POA: Diagnosis not present

## 2019-03-06 ENCOUNTER — Ambulatory Visit: Payer: Self-pay | Admitting: Pharmacist

## 2019-03-11 ENCOUNTER — Encounter: Payer: Self-pay | Admitting: Pharmacist

## 2019-03-21 DIAGNOSIS — E113411 Type 2 diabetes mellitus with severe nonproliferative diabetic retinopathy with macular edema, right eye: Secondary | ICD-10-CM | POA: Diagnosis not present

## 2019-03-21 DIAGNOSIS — H2511 Age-related nuclear cataract, right eye: Secondary | ICD-10-CM | POA: Diagnosis not present

## 2019-03-26 DIAGNOSIS — S88119A Complete traumatic amputation at level between knee and ankle, unspecified lower leg, initial encounter: Secondary | ICD-10-CM | POA: Diagnosis not present

## 2019-04-03 DIAGNOSIS — H2512 Age-related nuclear cataract, left eye: Secondary | ICD-10-CM | POA: Diagnosis not present

## 2019-04-03 DIAGNOSIS — E113412 Type 2 diabetes mellitus with severe nonproliferative diabetic retinopathy with macular edema, left eye: Secondary | ICD-10-CM | POA: Diagnosis not present

## 2019-04-25 DIAGNOSIS — E113411 Type 2 diabetes mellitus with severe nonproliferative diabetic retinopathy with macular edema, right eye: Secondary | ICD-10-CM | POA: Diagnosis not present

## 2019-04-25 DIAGNOSIS — H2511 Age-related nuclear cataract, right eye: Secondary | ICD-10-CM | POA: Diagnosis not present

## 2019-04-25 DIAGNOSIS — S88119A Complete traumatic amputation at level between knee and ankle, unspecified lower leg, initial encounter: Secondary | ICD-10-CM | POA: Diagnosis not present

## 2019-05-13 DIAGNOSIS — E113412 Type 2 diabetes mellitus with severe nonproliferative diabetic retinopathy with macular edema, left eye: Secondary | ICD-10-CM | POA: Diagnosis not present

## 2019-05-27 DIAGNOSIS — E113411 Type 2 diabetes mellitus with severe nonproliferative diabetic retinopathy with macular edema, right eye: Secondary | ICD-10-CM | POA: Diagnosis not present

## 2019-05-27 DIAGNOSIS — E113412 Type 2 diabetes mellitus with severe nonproliferative diabetic retinopathy with macular edema, left eye: Secondary | ICD-10-CM | POA: Diagnosis not present

## 2019-05-30 ENCOUNTER — Other Ambulatory Visit: Payer: Self-pay | Admitting: Pharmacy Technician

## 2019-05-30 NOTE — Patient Outreach (Signed)
[facility]) Care Management  05/30/2019  Brian Herring 17-Jun-1956 [id number]   ADDENDUM  Incoming call received from patient. HIPAA identifiers verified.  Patient informed he called his insurance provider [employer] but informed "he got the run around" and did not get his questions answered concerning the new care management vendor.Confirmed patient had correct concierge number to [employer].  Patient inquired about the payors that were accepted at [facility]. Per [place] Karrie Meres, I was able to provide the patient with the payors that we accepted.   Informed patient I would send a message to Karrie Meres, [place] Manager to have someone outreach him concerning patient assistance 2021.  Jasleen Riepe P. Floriene Jeschke, [place] Management [phone]

## 2019-05-30 NOTE — Patient Outreach (Signed)
[place] [facility]) Care Management  05/30/2019  Brian Herring 13-Oct-1955 [id number]    Incoming call received from patient in regards to questions he had related to patient assistance for 2021.  Spoke to patient, HIPAA identifiers verified.  Informed patient that his health plan has decided to use a different vendor for care management concerns for 2021 including patient assistance. Informed patient to call the concierge number on the back of his insurance card and inquire from them who the new vendor would be and request a phone number for him to call. Patient verbalized understanding.  Koula Venier P. Paije Goodhart, [place] Management [phone]

## 2019-06-03 DIAGNOSIS — E113412 Type 2 diabetes mellitus with severe nonproliferative diabetic retinopathy with macular edema, left eye: Secondary | ICD-10-CM | POA: Diagnosis not present

## 2019-06-11 ENCOUNTER — Other Ambulatory Visit: Payer: Self-pay | Admitting: Cardiology

## 2019-06-18 DIAGNOSIS — E7800 Pure hypercholesterolemia, unspecified: Secondary | ICD-10-CM | POA: Diagnosis not present

## 2019-06-18 DIAGNOSIS — I10 Essential (primary) hypertension: Secondary | ICD-10-CM | POA: Diagnosis not present

## 2019-06-18 DIAGNOSIS — E1169 Type 2 diabetes mellitus with other specified complication: Secondary | ICD-10-CM | POA: Diagnosis not present

## 2019-06-24 DIAGNOSIS — E1169 Type 2 diabetes mellitus with other specified complication: Secondary | ICD-10-CM | POA: Diagnosis not present

## 2019-06-24 DIAGNOSIS — Z23 Encounter for immunization: Secondary | ICD-10-CM | POA: Diagnosis not present

## 2019-06-24 DIAGNOSIS — D729 Disorder of white blood cells, unspecified: Secondary | ICD-10-CM | POA: Diagnosis not present

## 2019-06-25 DIAGNOSIS — E113412 Type 2 diabetes mellitus with severe nonproliferative diabetic retinopathy with macular edema, left eye: Secondary | ICD-10-CM | POA: Diagnosis not present

## 2019-06-25 DIAGNOSIS — H2511 Age-related nuclear cataract, right eye: Secondary | ICD-10-CM | POA: Diagnosis not present

## 2019-06-25 DIAGNOSIS — H2512 Age-related nuclear cataract, left eye: Secondary | ICD-10-CM | POA: Diagnosis not present

## 2019-06-25 DIAGNOSIS — E113411 Type 2 diabetes mellitus with severe nonproliferative diabetic retinopathy with macular edema, right eye: Secondary | ICD-10-CM | POA: Diagnosis not present

## 2019-07-01 ENCOUNTER — Ambulatory Visit: Payer: PPO | Admitting: Cardiology

## 2019-07-03 ENCOUNTER — Encounter: Payer: Self-pay | Admitting: Cardiology

## 2019-07-03 ENCOUNTER — Other Ambulatory Visit: Payer: Self-pay

## 2019-07-03 ENCOUNTER — Telehealth: Payer: PPO | Admitting: Cardiology

## 2019-07-03 VITALS — Ht 72.0 in | Wt 231.0 lb

## 2019-07-03 DIAGNOSIS — I10 Essential (primary) hypertension: Secondary | ICD-10-CM

## 2019-07-03 DIAGNOSIS — I428 Other cardiomyopathies: Secondary | ICD-10-CM | POA: Diagnosis not present

## 2019-07-03 DIAGNOSIS — E1152 Type 2 diabetes mellitus with diabetic peripheral angiopathy with gangrene: Secondary | ICD-10-CM

## 2019-07-03 DIAGNOSIS — Z794 Long term (current) use of insulin: Secondary | ICD-10-CM

## 2019-07-03 NOTE — Progress Notes (Signed)
Virtual Visit via Video Note   Subjective:   Brian Herring, male    DOB: 1956-07-02, 63 y.o.   MRN: [id number]    Chief complaint:  Cardiomyopathy   HPI  63 year old Caucasian male with hypertension, uncontrolled type 2 DM, s/p Rt transtibial amputation (2019) for osteomyelitis, nonischemic cardiomyopathy, CKD stage 3-4.  He is doing well. HbA1c down to 7.4%. He is compliant with his medical therapy. He denies chest pain, shortness of breath, palpitations, leg edema, orthopnea, PND, TIA/syncope.  Past Medical History:  Diagnosis Date  . Arthritis    shoulder  . Cancer ([place])    skin cancer on nose  . Cardiomyopathy ([place])   . Chronic systolic CHF (congestive heart failure) ([place])   . Diabetes mellitus without complication ([place])   . Hyperlipidemia   . Hypertension      Past Surgical History:  Procedure Laterality Date  . AMPUTATION Right 10/14/2017   Procedure: AMPUTATION  GREAT TOE;  Surgeon: Brian Minion, MD;  Location: [place];  Service: Orthopedics;  Laterality: Right;  . AMPUTATION Right 10/18/2017   Procedure: RIGHT BELOW KNEE AMPUTATION;  Surgeon: Brian Minion, MD;  Location: [facility];  Service: Orthopedics;  Laterality: Right;  . CYSTOSCOPY WITH INSERTION OF UROLIFT N/A 03/15/2018   Procedure: CYSTOSCOPY WITH INSERTION OF UROLIFT;  Surgeon: Brian Gallo, MD;  Location: [facility];  Service: Urology;  Laterality: N/A;  . SHOULDER SURGERY  01/05  . STUMP REVISION Right 12/15/2017   Procedure: REVISION RIGHT BELOW KNEE AMPUTATION;  Surgeon: Brian Minion, MD;  Location: [place];  Service: Orthopedics;  Laterality: Right;     Social History   Socioeconomic History  . Marital status: Single    Spouse name: Not on file  . Number of children: 0  . Years of education: Not on file  . Highest education level: Not on file  Occupational History  . Not on file  Social Needs  . Financial resource strain: Not on file  . Food insecurity    Worry: Not on file   Inability: Not on file  . Transportation needs    Medical: Not on file    Non-medical: Not on file  Tobacco Use  . Smoking status: Never Smoker  . Smokeless tobacco: Never Used  Substance and Sexual Activity  . Alcohol use: No    Alcohol/week: 0.0 standard drinks  . Drug use: Never  . Sexual activity: Yes  Lifestyle  . Physical activity    Days per week: Not on file    Minutes per session: Not on file  . Stress: Not on file  Relationships  . Social Herbalist on phone: Not on file    Gets together: Not on file    Attends religious service: Not on file    Active member of club or organization: Not on file    Attends meetings of clubs or organizations: Not on file    Relationship status: Not on file  . Intimate partner violence    Fear of current or ex partner: Not on file    Emotionally abused: Not on file    Physically abused: Not on file    Forced sexual activity: Not on file  Other Topics Concern  . Not on file  Social History Narrative  . Not on file     Family History  Problem Relation Age of Onset  . Pneumonia Mother        aspiration  Current Outpatient Medications on File Prior to Visit  Medication Sig Dispense Refill  . aspirin EC 81 MG tablet Take 81 mg by mouth daily.    . furosemide (LASIX) 40 MG tablet Take 40 mg by mouth daily.     . ivabradine ([name]) 5 MG TABS tablet Take 5 mg by mouth 2 (two) times daily with a meal.    . JARDIANCE 25 MG TABS tablet Take 1 tablet by mouth daily.    . metoprolol succinate (TOPROL-XL) 25 MG 24 hr tablet TAKE 1 AND 1/2 TABLETS BY MOUTH DAILY 135 tablet 3  . mupirocin ointment (BACTROBAN) 2 % Apply to affected area three times daily for 5 days  0  . rosuvastatin (CRESTOR) 10 MG tablet TAKE 1 TABLET BY MOUTH DAILY 90 tablet 3  . sacubitril-valsartan (ENTRESTO) 97-103 MG Take 1 tablet by mouth 2 (two) times daily.    Marland Kitchen spironolactone (ALDACTONE) 25 MG tablet Take 1 tablet (25 mg total) by mouth daily. 90  tablet 2  . acetaminophen (TYLENOL) 325 MG tablet Take 1-2 tablets (325-650 mg total) by mouth every 4 (four) hours as needed for mild pain.    . blood glucose meter kit and supplies KIT Dispense based on patient and insurance preference. Use up to four times daily as directed. (FOR ICD-10: E 11.65). 1 each 0  . glimepiride (AMARYL) 4 MG tablet Take 0.5 tablets (2 mg total) by mouth every morning. (Patient taking differently: Take 4 mg by mouth every morning. ) 30 tablet 11  . Lancets (ONETOUCH DELICA PLUS [id number]) MISC Use to test blood sugar daily  3  . ONE TOUCH ULTRA TEST test strip Use to test blood sugar daily  1   No current facility-administered medications on file prior to visit.     Cardiovascular studies:  Echocardiogram 12/06/2018: Mildly depressed LV systolic function with EF 40%. Left ventricle cavity is normal in size. Mild concentric hypertrophy of the left ventricle. Mildly decreased global wall motion. Doppler evidence of grade I (impaired) diastolic dysfunction, normal LAP. Calculated EF 40%. No hemodynamically significant valvular abnormalities. Compared to previous study on 05/31/2018, there is significant improvement in LVEF.  ABI 06/21/2017:  This exam reveals normal perfusion of the lower extremity (ABI) with bilateral ABI 1.08 with triphasic waveform (normal).  Lexiscan myoview stress test 04/28/2017: 1. Abnormal pharmacologic stress nuclear study but negative for ischemia.     2. Low risk of hemodynamically significant coronary artery disease. Prognostically, this is a high risk study. 3. Non diagnostic stress EKG due to pharmacologic stress.  4. Fixed perfusion defect due to diaphragmatic attenuation. The left ventricle is severely dilated. Gated SPECT imaging demonstrates severe global hypokinesis. The gated study showed the ejection fraction was 10-15%. Findings consistent with non-ischemic  cardiomyopathy.  Recent labs: Results for Brian Herring, Brian Herring (MRN  [id number]) as of 12/14/2018 14:06  Ref. Range 12/06/2018 25:49  BASIC METABOLIC PANEL Unknown Rpt (A)  Sodium Latest Ref Range: 134 - 144 mmol/L 137  Potassium Latest Ref Range: 3.5 - 5.2 mmol/L 5.1  Chloride Latest Ref Range: 96 - 106 mmol/L 99  CO2 Latest Ref Range: 20 - 29 mmol/L 23  Glucose Latest Ref Range: 65 - 99 mg/dL 200 (H)  BUN Latest Ref Range: 8 - 27 mg/dL 26  Creatinine Latest Ref Range: 0.76 - 1.27 mg/dL 1.08  Calcium Latest Ref Range: 8.6 - 10.2 mg/dL 10.3 (H)  BUN/Creatinine Ratio Latest Ref Range: 10 - 24  24  GFR, Est Non African  American Latest Ref Range: >59 mL/min/1.73 73  GFR, Est African American Latest Ref Range: >59 mL/min/1.73 85   Results for Falzone, DURANT SCIBILIA (MRN [id number]) as of 12/14/2018 14:06  Ref. Range 03/12/2018 14:30  WBC Latest Ref Range: 4.0 - 10.5 K/uL 7.3  RBC Latest Ref Range: 4.22 - 5.81 MIL/uL 4.32  Hemoglobin Latest Ref Range: 13.0 - 17.0 g/dL 11.1 (L)  HCT Latest Ref Range: 39.0 - 52.0 % 35.4 (L)  MCV Latest Ref Range: 78.0 - 100.0 fL 81.9  MCH Latest Ref Range: 26.0 - 34.0 pg 25.7 (L)  MCHC Latest Ref Range: 30.0 - 36.0 g/dL 31.4  RDW Latest Ref Range: 11.5 - 15.5 % 14.3  Platelets Latest Ref Range: 150 - 400 K/uL 345   Results for Hoon, BENEDICTO CAPOZZI (MRN [id number]) as of 12/14/2018 14:06  Ref. Range 12/15/2017 08:38 01/08/2018 19:30 03/12/2018 14:30 12/06/2018 10:24  Glucose Latest Ref Range: 65 - 99 mg/dL 147 (H) 176 (H) 124 (H) 200 (H)  Hemoglobin A1C Latest Ref Range: 4.8 - 5.6 % 6.9 (H)  7.0 (H)     Review of Systems  Constitution: Negative for decreased appetite, malaise/fatigue, weight gain and weight loss.  HENT: Negative for congestion.   Eyes: Negative for visual disturbance.  Cardiovascular: Negative for chest pain, dyspnea on exertion, leg swelling, palpitations and syncope.  Respiratory: Negative for cough.   Endocrine: Negative for cold intolerance.  Hematologic/Lymphatic: Does not bruise/bleed easily.  Skin: Negative for  itching and rash.       Left shin wound. Dressing in place.  Musculoskeletal: Negative for myalgias.  Gastrointestinal: Negative for abdominal pain, nausea and vomiting.  Genitourinary: Negative for dysuria.  Neurological: Negative for dizziness and weakness.  Psychiatric/Behavioral: The patient is not nervous/anxious.   All other systems reviewed and are negative.      No vitals available.  Objective:    Physical Exam   Not performed. Telephone visit.        Assessment & Recommendations:   63 year old Caucasian male with hypertension, uncontrolled type 2 DM, s/p Rt transtibial amputation (2019) for osteomyelitis, nonischemic cardiomyopathy, CKD stage 3-4.  Nonischemic cardiomyopathy: EF improved to 40% in 11/2018. ContinueIncrease metoprolol succinate to 37.5 mg daily, spironolactone to 25 mg daily, Entresto 97-103 mg bid, corlanor 5 mg bid.  Hypertension: Controlled.  Type 2 DM: Continue follow up with PCP. Now on Jardiance.   H/o osetomyelitis: S/p right transtibial amputation Walking well with prosthetic leg.  CKD stage 3: Stable  I will see him back in 3 months after labs.    Nigel Mormon, MD [facility] Cardiovascular. [place] Pager: [phone] Office: [phone] If no answer Cell [phone]

## 2019-07-05 DIAGNOSIS — E1169 Type 2 diabetes mellitus with other specified complication: Secondary | ICD-10-CM | POA: Diagnosis not present

## 2019-07-05 DIAGNOSIS — E7800 Pure hypercholesterolemia, unspecified: Secondary | ICD-10-CM | POA: Diagnosis not present

## 2019-07-05 DIAGNOSIS — I10 Essential (primary) hypertension: Secondary | ICD-10-CM | POA: Diagnosis not present

## 2019-07-08 DIAGNOSIS — H2512 Age-related nuclear cataract, left eye: Secondary | ICD-10-CM | POA: Diagnosis not present

## 2019-07-08 DIAGNOSIS — E113411 Type 2 diabetes mellitus with severe nonproliferative diabetic retinopathy with macular edema, right eye: Secondary | ICD-10-CM | POA: Diagnosis not present

## 2019-07-08 DIAGNOSIS — H2511 Age-related nuclear cataract, right eye: Secondary | ICD-10-CM | POA: Diagnosis not present

## 2019-07-08 DIAGNOSIS — E113412 Type 2 diabetes mellitus with severe nonproliferative diabetic retinopathy with macular edema, left eye: Secondary | ICD-10-CM | POA: Diagnosis not present

## 2019-07-12 ENCOUNTER — Other Ambulatory Visit: Payer: Self-pay | Admitting: Cardiology

## 2019-08-06 DIAGNOSIS — I10 Essential (primary) hypertension: Secondary | ICD-10-CM | POA: Diagnosis not present

## 2019-08-06 DIAGNOSIS — E1169 Type 2 diabetes mellitus with other specified complication: Secondary | ICD-10-CM | POA: Diagnosis not present

## 2019-08-06 DIAGNOSIS — E7800 Pure hypercholesterolemia, unspecified: Secondary | ICD-10-CM | POA: Diagnosis not present

## 2019-08-19 DIAGNOSIS — H2512 Age-related nuclear cataract, left eye: Secondary | ICD-10-CM | POA: Diagnosis not present

## 2019-08-19 DIAGNOSIS — H2511 Age-related nuclear cataract, right eye: Secondary | ICD-10-CM | POA: Diagnosis not present

## 2019-08-19 DIAGNOSIS — E113412 Type 2 diabetes mellitus with severe nonproliferative diabetic retinopathy with macular edema, left eye: Secondary | ICD-10-CM | POA: Diagnosis not present

## 2019-08-19 DIAGNOSIS — E113411 Type 2 diabetes mellitus with severe nonproliferative diabetic retinopathy with macular edema, right eye: Secondary | ICD-10-CM | POA: Diagnosis not present

## 2019-08-20 DIAGNOSIS — H2512 Age-related nuclear cataract, left eye: Secondary | ICD-10-CM | POA: Diagnosis not present

## 2019-08-20 DIAGNOSIS — E113412 Type 2 diabetes mellitus with severe nonproliferative diabetic retinopathy with macular edema, left eye: Secondary | ICD-10-CM | POA: Diagnosis not present

## 2019-08-21 DIAGNOSIS — Z89511 Acquired absence of right leg below knee: Secondary | ICD-10-CM | POA: Diagnosis not present

## 2019-08-21 DIAGNOSIS — W19XXXA Unspecified fall, initial encounter: Secondary | ICD-10-CM | POA: Diagnosis not present

## 2019-08-21 DIAGNOSIS — S7011XA Contusion of right thigh, initial encounter: Secondary | ICD-10-CM | POA: Diagnosis not present

## 2019-08-21 DIAGNOSIS — S300XXA Contusion of lower back and pelvis, initial encounter: Secondary | ICD-10-CM | POA: Diagnosis not present

## 2019-08-31 IMAGING — DX DG FOOT COMPLETE 3+V*R*
3 series · 3 of 3 positions shown · non-contrast
Comparison: None.

CLINICAL DATA: Right great toe wound. Redness, painful and swollen.

EXAM:
RIGHT FOOT COMPLETE - 3+ VIEW

[foot ap]
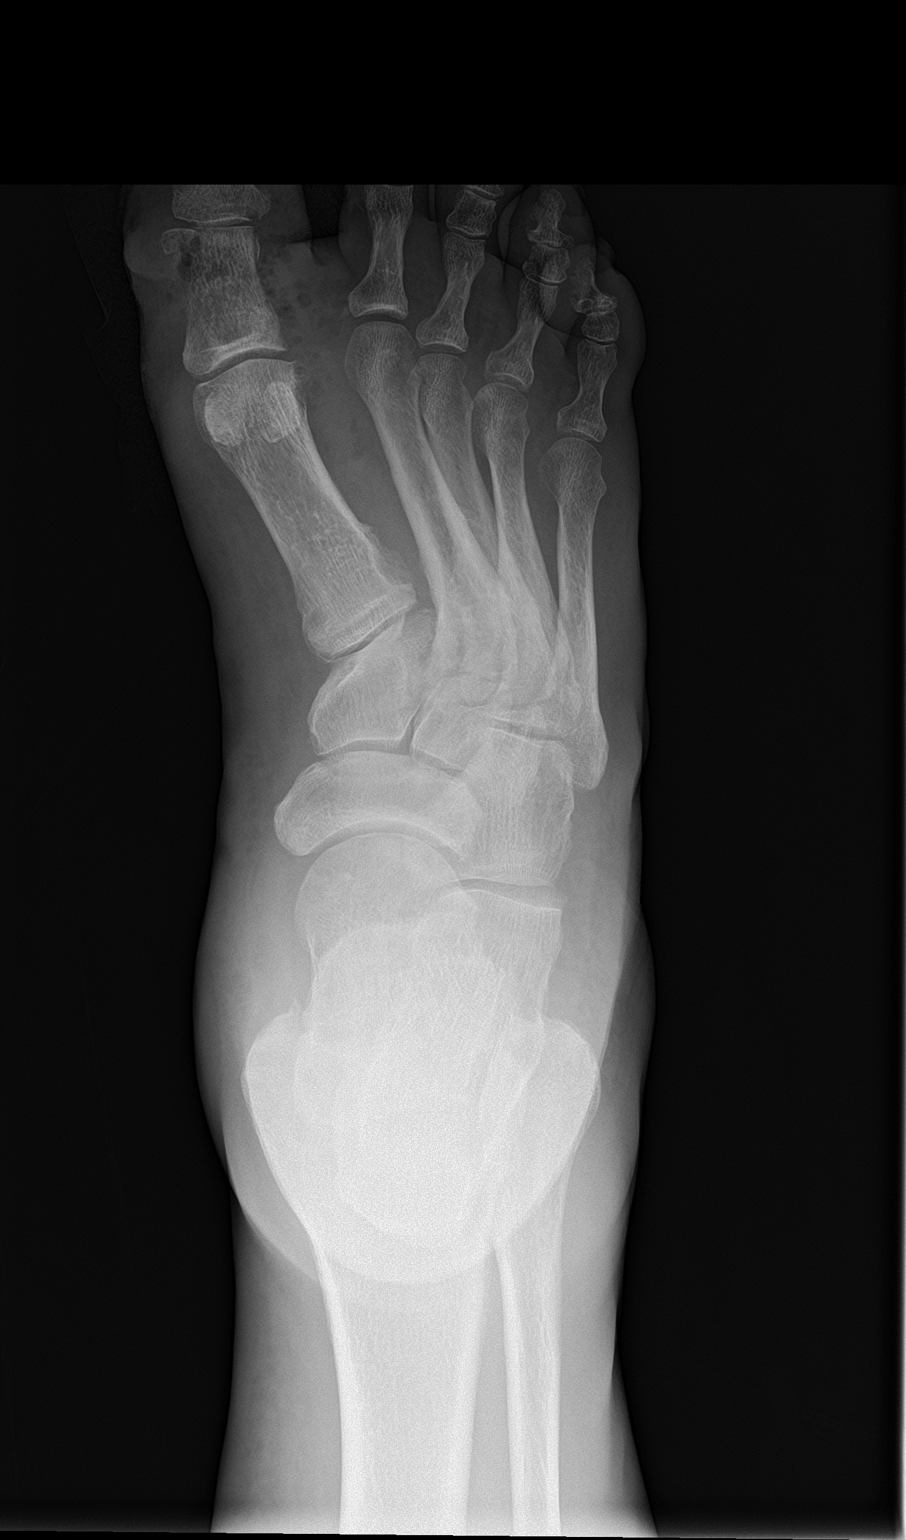

[foot obl]
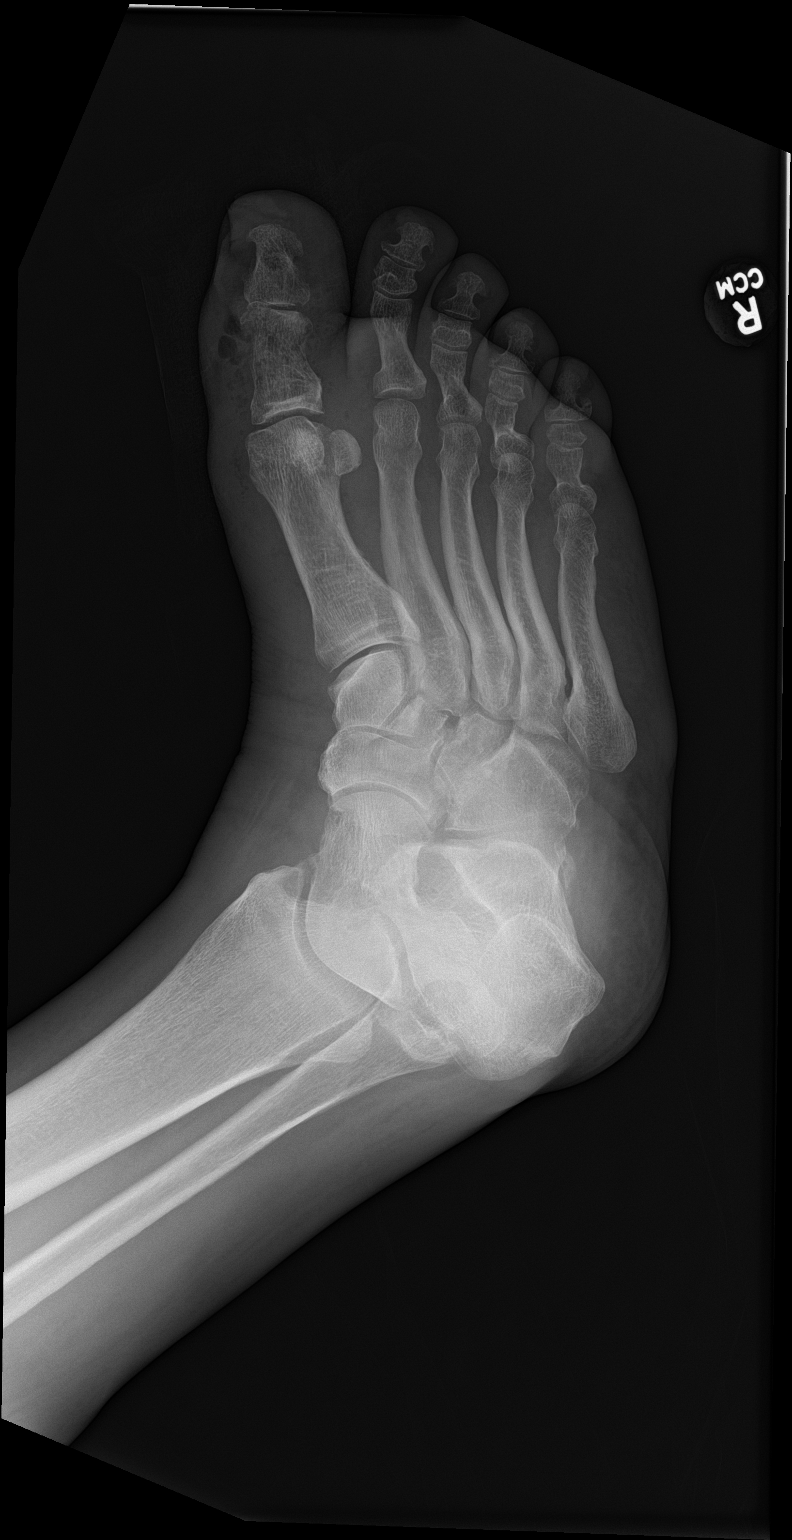

[foot lat]
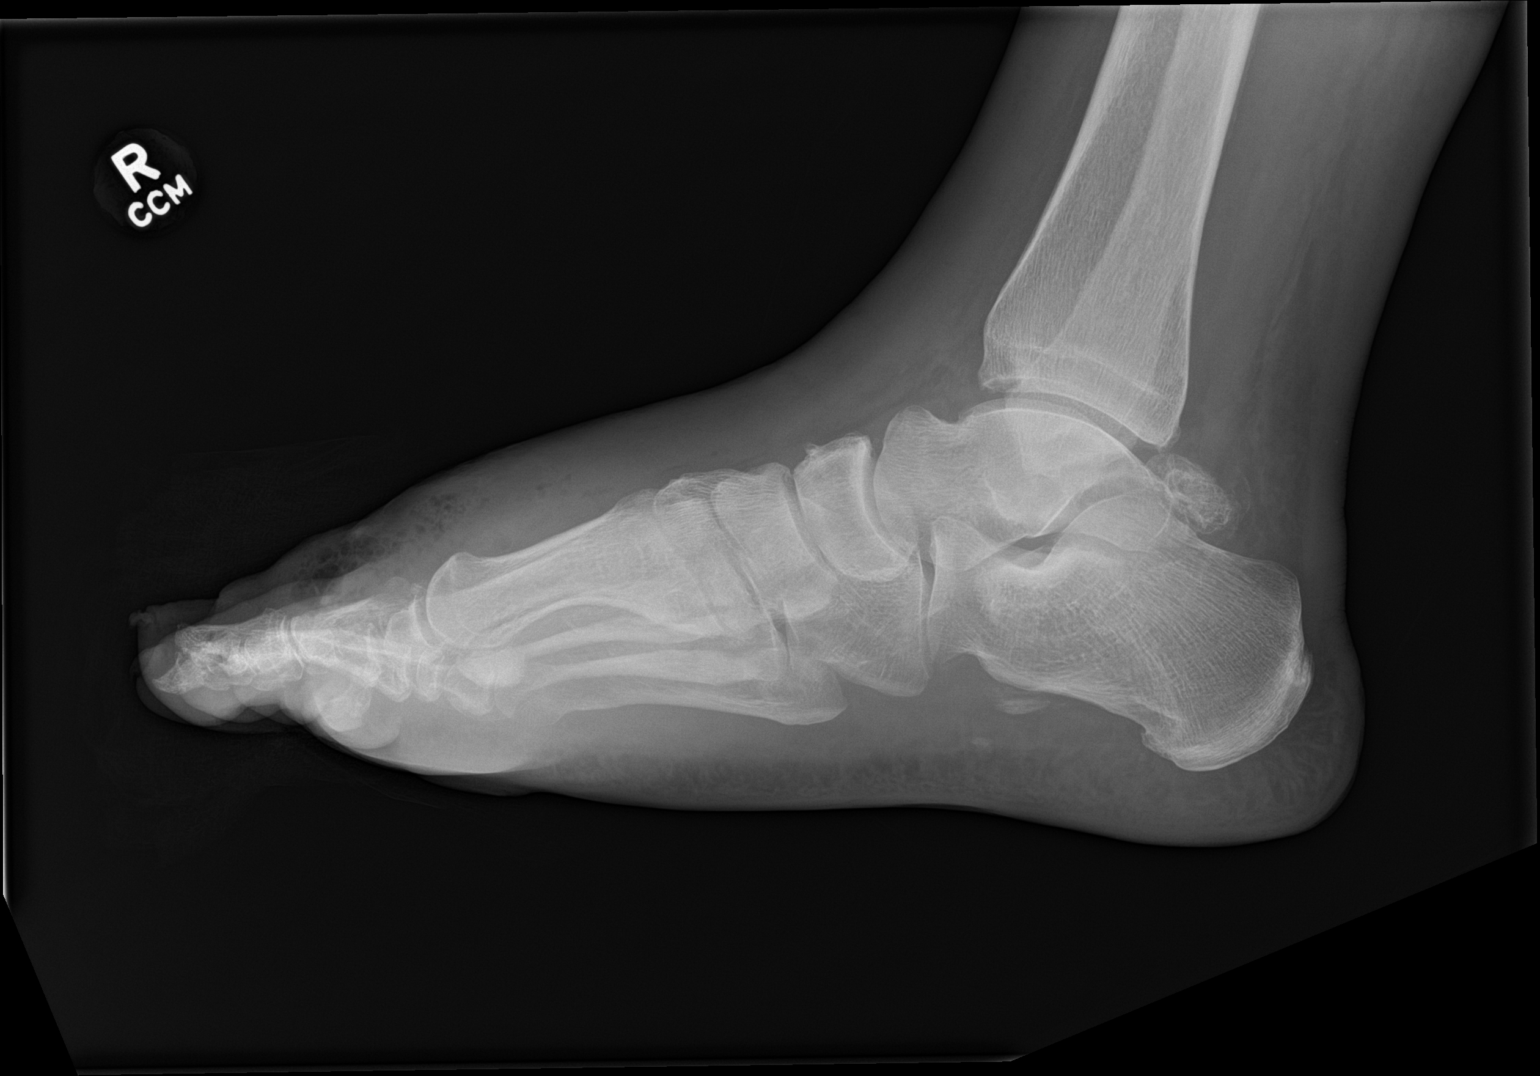

[3 of 3 positions shown; findings below may reference images not displayed]

FINDINGS: No acute fracture or dislocation. Partial osteolysis of distal
medial corner of the first proximal phalanx with a nondisplaced
fracture. Soft tissue emphysema in the soft tissue surrounding the
great toe extending into the remainder of the forefoot along the
medial aspect with soft tissue swelling.

No other fracture or dislocation. No periosteal reaction. Normal
alignment.
IMPRESSION: 1. Soft tissue emphysema in the soft tissue surrounding the great
toe extending into the remainder of the forefoot concerning for
gas-forming infection.
2. Partial osteolysis of the distal medial corner of the first
proximal phalanx with a nondisplaced fracture concerning for
underlying osteomyelitis.

## 2019-09-01 IMAGING — MR MR FOOT*R* WO/W CM
11 of 12 series · 39 of 40 positions shown · IV contrast (multihance)
Comparison: Right foot x-rays from yesterday.

CLINICAL DATA: Chronic right great toe ulcer with new
discoloration, swelling, and foul odor. Evaluate for osteomyelitis.

EXAM:
MRI OF THE RIGHT FOREFOOT WITHOUT AND WITH CONTRAST
TECHNIQUE: Multiplanar, multisequence MR imaging of the right forefoot was
performed before and after the administration of intravenous
contrast.
CONTRAST:  20mL MULTIHANCE GADOBENATE DIMEGLUMINE 529 MG/ML IV SOLN

[Series 3001: T1 · coronal · right · 3.0mm · 0.51mm/px · 4 of 47 slices shown (1 of 4)]
[im 1/47]
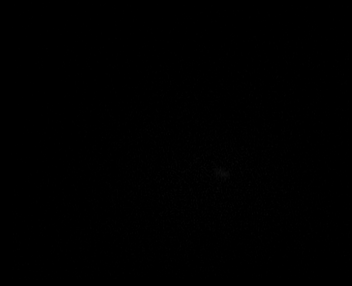
[im 16/47]
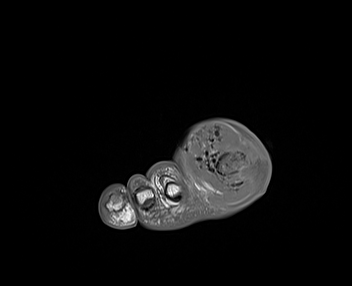
[im 31/47]
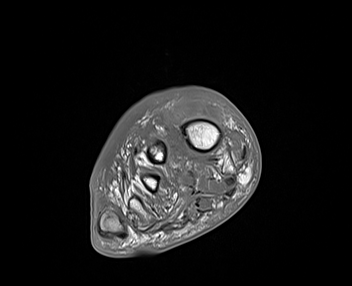
[im 47/47]
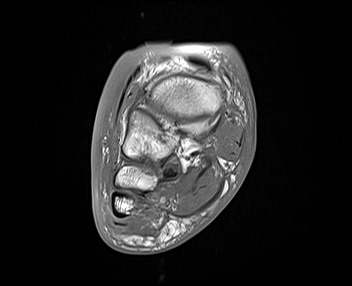

[Series 4001: T1 · sagittal · right · 3.0mm · 0.48mm/px · 3 of 36 slices shown (2 of 4)]
[im 1/36]
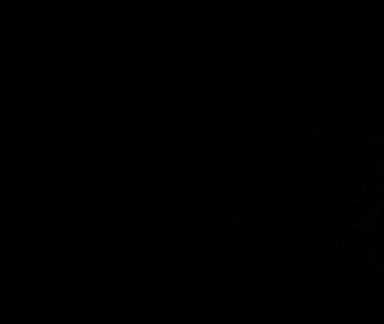
[im 18/36]
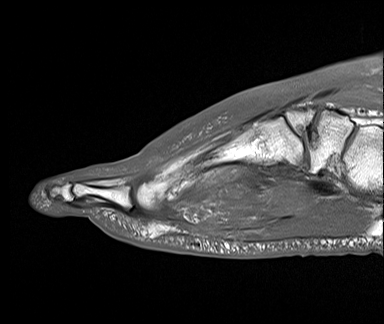
[im 36/36]
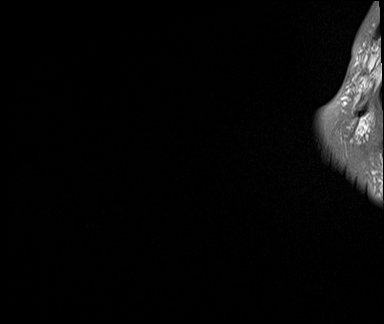

[Series 5001: STIR · sagittal · right · 3.0mm · 0.70mm/px · 3 of 34 slices shown (1 of 3)]
[im 1/34]
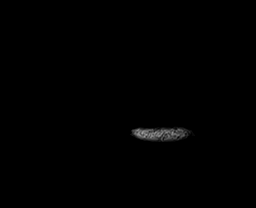
[im 17/34]
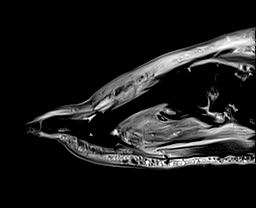
[im 34/34]
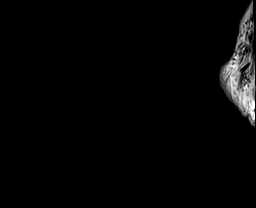

[Series 6001: STIR · coronal · right · 3.0mm · 0.70mm/px · 5 of 47 slices shown (2 of 3)]
[im 1/47]
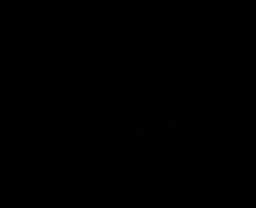
[im 12/47]
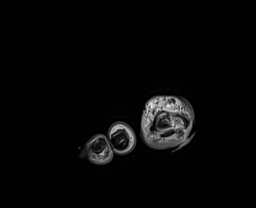
[im 24/47]
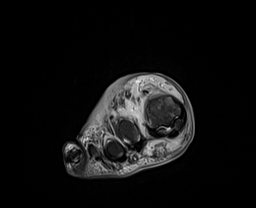
[im 35/47]
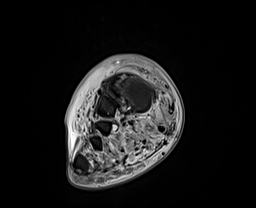
[im 47/47]
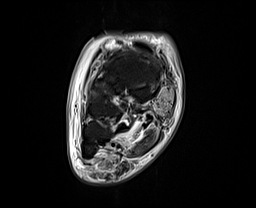

[Series 8001: T1 · axial · right · 3.0mm · 0.68mm/px · 1 of 9 slices shown (3 of 4)]
[im 1/9]
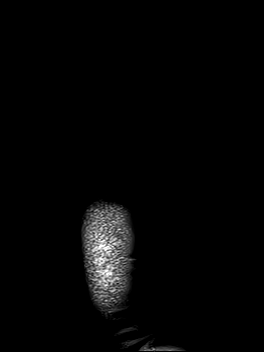

[Series 9001: STIR · axial · right · 3.0mm · 0.94mm/px · z∈[-125,-33]mm · 3 of 32 slices shown (3 of 3)]
[im 1/32]
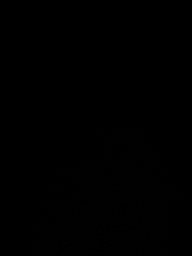
[im 16/32]
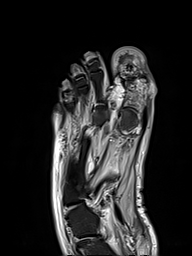
[im 32/32]
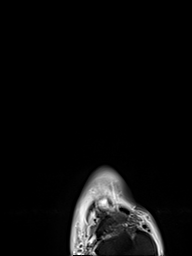

[T1 · axial · right · 3.0mm · 0.68mm/px · z∈[-125,-33]mm · 3 of 32 slices shown (4 of 4)]
[im 1/32]
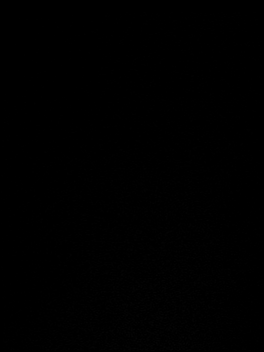
[im 16/32]
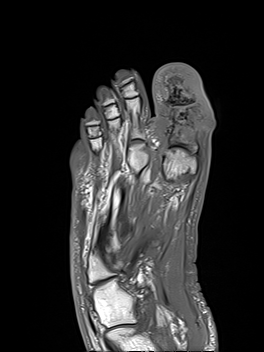
[im 32/32]
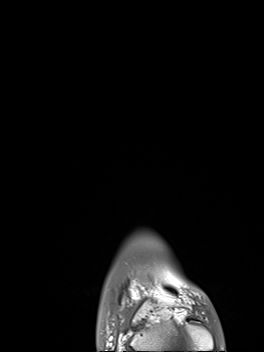

[T1 fat-sat · coronal · non-contrast · right · 3.0mm · 0.59mm/px · 5 of 47 slices shown (1 of 3)]
[im 1/47]
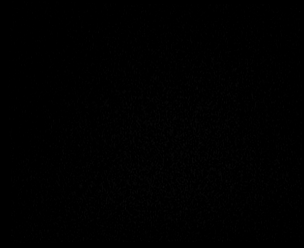
[im 12/47]
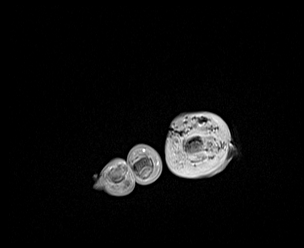
[im 24/47]
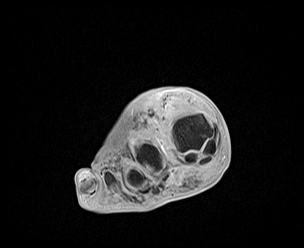
[im 35/47]
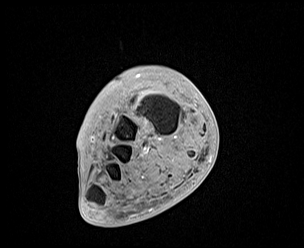
[im 47/47]
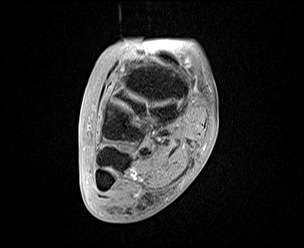

[T1 fat-sat post-contrast · coronal · right · 3.0mm · 0.59mm/px · 5 of 47 slices shown]
[im 1/47]
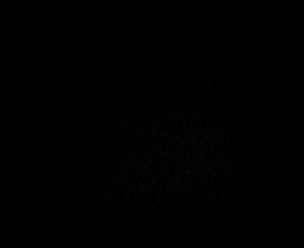
[im 12/47]
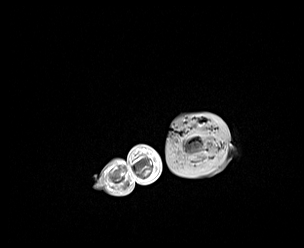
[im 24/47]
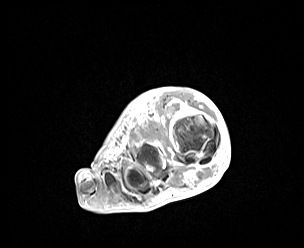
[im 35/47]
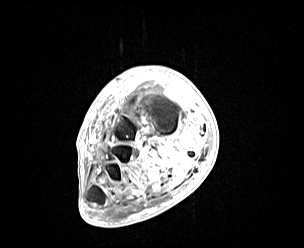
[im 47/47]
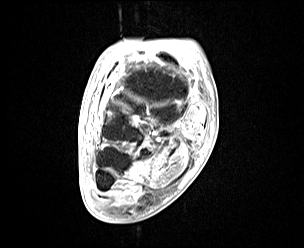

[T1 fat-sat · sagittal · right · 3.0mm · 0.61mm/px · 4 of 36 slices shown (2 of 3)]
[im 1/36]
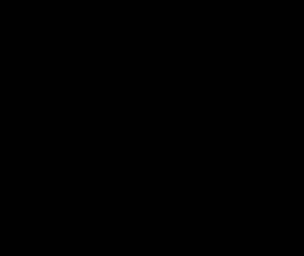
[im 12/36]
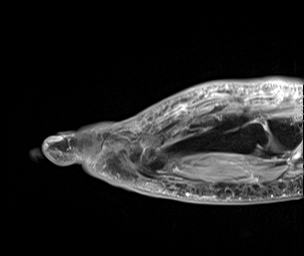
[im 24/36]
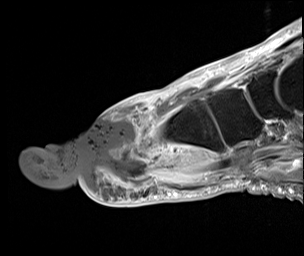
[im 36/36]
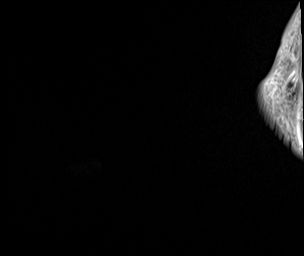

[T1 fat-sat · axial · right · 3.0mm · 0.75mm/px · z∈[-125,-33]mm · 3 of 32 slices shown (3 of 3)]
[im 1/32]
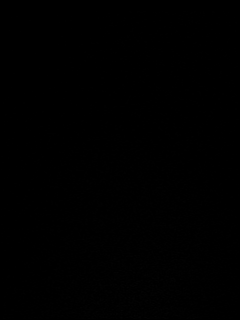
[im 16/32]
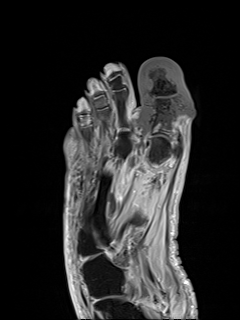
[im 32/32]
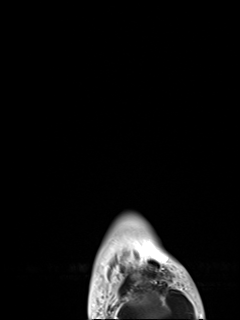

[39 of 40 positions shown; findings below may reference images not displayed]

FINDINGS: Bones/Joint/Cartilage

There is prominent edema and enhancement with corresponding T1
marrow signal intensity involving the first proximal and distal
phalanges, consistent with osteomyelitis. No fracture or
dislocation. Normal alignment. Small first IP joint and first MTP
joint effusions.

Ligaments

Collateral ligaments are intact.  Lisfranc ligament is intact.

Muscles and Tendons
There is prominent fluid and enhancement within the extensor
hallucis longus tendon sheath, consistent with tenosynovitis. This
extends proximally to the level of the navicular. Increased T2
signal within the intrinsic muscles of the forefoot, nonspecific,
but likely related to diabetic muscle changes.

Soft tissue
Soft tissue ulceration along the medial great toe. Large gas
containing fluid collection about the great toe, measuring
approximately 6.0 x 3.6 x 4.5 cm (AP by transverse by CC), extending
along the dorsal forefoot to the mid first metatarsal.
IMPRESSION: 1. Soft tissue ulceration along the medial aspect of the great toe
with extensive gas-forming infection of the great toe and
osteomyelitis of the first proximal and distal phalanges. Large
cm abscess about the great toe extending proximally to the mid first
metatarsal.
2. Infectious tenosynovitis of the extensor hallucis longus tendon.

## 2019-09-06 DIAGNOSIS — I10 Essential (primary) hypertension: Secondary | ICD-10-CM | POA: Diagnosis not present

## 2019-09-06 DIAGNOSIS — E7800 Pure hypercholesterolemia, unspecified: Secondary | ICD-10-CM | POA: Diagnosis not present

## 2019-09-06 DIAGNOSIS — E1169 Type 2 diabetes mellitus with other specified complication: Secondary | ICD-10-CM | POA: Diagnosis not present

## 2019-09-23 DIAGNOSIS — H2512 Age-related nuclear cataract, left eye: Secondary | ICD-10-CM | POA: Diagnosis not present

## 2019-09-23 DIAGNOSIS — H2511 Age-related nuclear cataract, right eye: Secondary | ICD-10-CM | POA: Diagnosis not present

## 2019-09-23 DIAGNOSIS — E113412 Type 2 diabetes mellitus with severe nonproliferative diabetic retinopathy with macular edema, left eye: Secondary | ICD-10-CM | POA: Diagnosis not present

## 2019-09-30 DIAGNOSIS — E113411 Type 2 diabetes mellitus with severe nonproliferative diabetic retinopathy with macular edema, right eye: Secondary | ICD-10-CM | POA: Diagnosis not present

## 2019-09-30 DIAGNOSIS — H2511 Age-related nuclear cataract, right eye: Secondary | ICD-10-CM | POA: Diagnosis not present

## 2019-10-01 ENCOUNTER — Other Ambulatory Visit: Payer: Self-pay

## 2019-10-01 DIAGNOSIS — E1169 Type 2 diabetes mellitus with other specified complication: Secondary | ICD-10-CM | POA: Diagnosis not present

## 2019-10-01 DIAGNOSIS — I10 Essential (primary) hypertension: Secondary | ICD-10-CM | POA: Diagnosis not present

## 2019-10-01 DIAGNOSIS — E7800 Pure hypercholesterolemia, unspecified: Secondary | ICD-10-CM | POA: Diagnosis not present

## 2019-10-01 MED ORDER — IVABRADINE HCL 5 MG PO TABS: 5.0000 mg | ORAL_TABLET | Freq: Two times a day (BID) | ORAL | 1 refills | Status: DC

## 2019-10-01 MED ORDER — FUROSEMIDE 40 MG PO TABS: 40.0000 mg | ORAL_TABLET | Freq: Every day | ORAL | 1 refills | Status: DC

## 2019-10-01 MED ORDER — SPIRONOLACTONE 25 MG PO TABS: 25.0000 mg | ORAL_TABLET | Freq: Every day | ORAL | 2 refills | Status: DC

## 2019-10-01 MED ORDER — ROSUVASTATIN CALCIUM 10 MG PO TABS: 10.0000 mg | ORAL_TABLET | Freq: Every day | ORAL | 1 refills | Status: AC

## 2019-10-14 DIAGNOSIS — I428 Other cardiomyopathies: Secondary | ICD-10-CM | POA: Diagnosis not present

## 2019-10-15 LAB — BASIC METABOLIC PANEL
BUN/Creatinine Ratio: 20 (ref 10–24)
BUN: 25 mg/dL (ref 8–27)
CO2: 24 mmol/L (ref 20–29)
Calcium: 9.9 mg/dL (ref 8.6–10.2)
Chloride: 99 mmol/L (ref 96–106)
Creatinine, Ser: 1.23 mg/dL (ref 0.76–1.27)
GFR calc Af Amer: 72 mL/min/{1.73_m2} (ref >59)
GFR calc non Af Amer: 62 mL/min/{1.73_m2} (ref >59)
Glucose: 110 mg/dL — ABNORMAL HIGH (ref 65–99)
Potassium: 5.3 mmol/L — ABNORMAL HIGH (ref 3.5–5.2)
Sodium: 141 mmol/L (ref 134–144)

## 2019-10-28 ENCOUNTER — Encounter: Payer: Self-pay | Admitting: Cardiology

## 2019-10-28 ENCOUNTER — Ambulatory Visit: Payer: PPO | Admitting: Cardiology

## 2019-10-28 ENCOUNTER — Other Ambulatory Visit: Payer: Self-pay

## 2019-10-28 VITALS — BP 125/79 | HR 85 | Temp 97.9°F | Ht 72.0 in | Wt 233.0 lb

## 2019-10-28 DIAGNOSIS — E1152 Type 2 diabetes mellitus with diabetic peripheral angiopathy with gangrene: Secondary | ICD-10-CM

## 2019-10-28 DIAGNOSIS — I10 Essential (primary) hypertension: Secondary | ICD-10-CM | POA: Diagnosis not present

## 2019-10-28 DIAGNOSIS — Z794 Long term (current) use of insulin: Secondary | ICD-10-CM

## 2019-10-28 DIAGNOSIS — I428 Other cardiomyopathies: Secondary | ICD-10-CM | POA: Diagnosis not present

## 2019-10-28 NOTE — Progress Notes (Signed)
Subjective:   Brian Herring, male    DOB: 02-23-1956, 64 y.o.   MRN: [id number]    Chief complaint:  Cardiomyopathy   HPI  64 year old Caucasian male with hypertension, uncontrolled type 2 DM, s/p Rt transtibial amputation (2019) for osteomyelitis, nonischemic cardiomyopathy, CKD stage 3-4.  He is doing well. HbA1c down to 7.4%. He is compliant with his medical therapy. He denies chest pain, shortness of breath, palpitations, leg edema, orthopnea, PND, TIA/syncope.    Current Outpatient Medications on File Prior to Visit  Medication Sig Dispense Refill  . acetaminophen (TYLENOL) 325 MG tablet Take 1-2 tablets (325-650 mg total) by mouth every 4 (four) hours as needed for mild pain.    Marland Kitchen aspirin EC 81 MG tablet Take 81 mg by mouth daily.    . blood glucose meter kit and supplies KIT Dispense based on patient and insurance preference. Use up to four times daily as directed. (FOR ICD-10: E 11.65). 1 each 0  . ENTRESTO 97-103 MG TAKE 1 TABLET BY MOUTH TWICE DAILY 60 tablet 3  . furosemide (LASIX) 40 MG tablet Take 1 tablet (40 mg total) by mouth daily. 90 tablet 1  . glimepiride (AMARYL) 4 MG tablet Take 0.5 tablets (2 mg total) by mouth every morning. (Patient taking differently: Take 4 mg by mouth every morning. ) 30 tablet 11  . ivabradine ([name]) 5 MG TABS tablet Take 1 tablet (5 mg total) by mouth 2 (two) times daily with a meal. 180 tablet 1  . JARDIANCE 25 MG TABS tablet Take 1 tablet by mouth daily.    . Lancets (ONETOUCH DELICA PLUS [id number]) MISC Use to test blood sugar daily  3  . metoprolol succinate (TOPROL-XL) 25 MG 24 hr tablet TAKE 1 AND 1/2 TABLETS BY MOUTH DAILY 135 tablet 3  . mupirocin ointment (BACTROBAN) 2 % Apply to affected area three times daily for 5 days  0  . ONE TOUCH ULTRA TEST test strip Use to test blood sugar daily  1  . rosuvastatin (CRESTOR) 10 MG tablet Take 1 tablet (10 mg total) by mouth daily. 90 tablet 1  . spironolactone (ALDACTONE) 25  MG tablet Take 1 tablet (25 mg total) by mouth daily. 90 tablet 2   No current facility-administered medications on file prior to visit.    Cardiovascular studies:  EKG 10/28/2019: Sinus rhythm 66 bpm. Left anterior fascicular block.  Echocardiogram 12/06/2018: Mildly depressed LV systolic function with EF 40%. Left ventricle cavity is normal in size. Mild concentric hypertrophy of the left ventricle. Mildly decreased global wall motion. Doppler evidence of grade I (impaired) diastolic dysfunction, normal LAP. Calculated EF 40%. No hemodynamically significant valvular abnormalities. Compared to previous study on 05/31/2018, there is significant improvement in LVEF.  ABI 06/21/2017:  This exam reveals normal perfusion of the lower extremity (ABI) with bilateral ABI 1.08 with triphasic waveform (normal).  Lexiscan myoview stress test 04/28/2017: 1. Abnormal pharmacologic stress nuclear study but negative for ischemia.     2. Low risk of hemodynamically significant coronary artery disease. Prognostically, this is a high risk study. 3. Non diagnostic stress EKG due to pharmacologic stress.  4. Fixed perfusion defect due to diaphragmatic attenuation. The left ventricle is severely dilated. Gated SPECT imaging demonstrates severe global hypokinesis. The gated study showed the ejection fraction was 10-15%. Findings consistent with non-ischemic  cardiomyopathy.  Recent labs: 10/14/2019: Glucose 110, BUN/Cr 25/1.23. EGFR 62. Na/K 141/5.3.   06/24/2019: HbA1C 7.4%   Review of Systems  Cardiovascular: Negative for chest pain, dyspnea on exertion, leg swelling, palpitations and syncope.        Vitals:   10/28/19 1015  BP: 125/79  Pulse: 85  Temp: 97.9 F (36.6 C)  SpO2: 97%     Objective:    Physical Exam  Constitutional: He appears well-developed and well-nourished.  Neck: No JVD present.  Cardiovascular: Normal rate, regular rhythm, normal heart sounds and intact distal  pulses.  RLE  Pulmonary/Chest: Effort normal and breath sounds normal. He has no wheezes. He has no rales.  Musculoskeletal:        General: No edema.     Comments: Rt transtibial amputation.  Nursing note and vitals reviewed.         Assessment & Recommendations:   64 year old Caucasian male with hypertension, uncontrolled type 2 DM, s/p Rt transtibial amputation (2019) for osteomyelitis, nonischemic cardiomyopathy, CKD stage 3-4.  Nonischemic cardiomyopathy: NYHA class I-II. EF improved to 40% in 11/2018. Will repeat echocardiogram now.  Currently on metoprolol succinate to 37.5 mg daily, spironolactone to 25 mg daily, Entresto 97-103 mg bid, corlanor 5 mg bid. Tolerating well.  Hypertension: Controlled.  Type 2 DM: Continue follow up with PCP. Now on Jardiance.   H/o osetomyelitis: S/p right transtibial amputation Walking well with prosthetic leg.  CKD stage 3: Stable  F/u in 1 year.   Nigel Mormon, MD [facility] Pager: [phone] Office: [phone] If no answer Cell [phone]

## 2019-10-30 ENCOUNTER — Ambulatory Visit (INDEPENDENT_AMBULATORY_CARE_PROVIDER_SITE_OTHER): Payer: PPO | Admitting: Ophthalmology

## 2019-10-30 ENCOUNTER — Other Ambulatory Visit: Payer: Self-pay

## 2019-10-30 ENCOUNTER — Encounter (INDEPENDENT_AMBULATORY_CARE_PROVIDER_SITE_OTHER): Payer: Self-pay | Admitting: Ophthalmology

## 2019-10-30 DIAGNOSIS — E113412 Type 2 diabetes mellitus with severe nonproliferative diabetic retinopathy with macular edema, left eye: Secondary | ICD-10-CM | POA: Insufficient documentation

## 2019-10-30 DIAGNOSIS — H2512 Age-related nuclear cataract, left eye: Secondary | ICD-10-CM

## 2019-10-30 DIAGNOSIS — E113411 Type 2 diabetes mellitus with severe nonproliferative diabetic retinopathy with macular edema, right eye: Secondary | ICD-10-CM | POA: Insufficient documentation

## 2019-10-30 DIAGNOSIS — H2511 Age-related nuclear cataract, right eye: Secondary | ICD-10-CM | POA: Insufficient documentation

## 2019-10-30 MED ORDER — AFLIBERCEPT 2MG/0.05ML IZ SOLN FOR KALEIDOSCOPE
2.0000 mg | INTRAVITREAL | Status: AC | PRN
  Administered 2019-10-30: 11:00:00 2 mg via INTRAVITREAL

## 2019-10-30 NOTE — Assessment & Plan Note (Signed)
The nature of severe nonproliferative diabetic retinopathy discussed with the patient as well as the need for more frequent follow up and likely progression to proliferative disease in the near future. The options of continued observation versus panretinal photocoagulation at this time were reviewed as well as the risks, benefits, and alternatives. More recent option includes the use of ocular injectable medications to slow progression of retinal disease. Tight control of glucose, blood pressure, and serum lipid levels were recommended under the direction of general physician or endocrinologist, as well as avoidance of smoking and maintenance of normal body weight. The 2-year risk of progression to proliferative diabetic retinopathy is 60%. The nature of diabetic macular edema was discussed with the patient. Treatment options were outlined including medical therapy, laser & vitrectomy. The use of injectable medications reviewed, including Avastin, Lucentis, and Eylea. Periodic injections into the eye are likely to resolve diabetic macular edema (swelling in the center of vision). Initially, injections are delivered are delivered every 4-6 weeks, and the interval extended as the condition improves. On average, 8-9 injections the first year, and 5 in year 2. Improvement in the condition most often improves on medical therapy. Occasional use of focal laser is also recommended for residual macular edema (swelling). Excellent control of blood glucose and blood pressure are encouraged under the care of a primary physician or endocrinologist. Similarly, attempts to maintain serum cholesterol, low density lipoproteins, and high-density lipoproteins in a favorable range were recommended.  

## 2019-10-30 NOTE — Patient Instructions (Addendum)
Diabetic Retinopathy Diabetic retinopathy is a disease of the retina. The retina is a light-sensitive membrane at the back of the eye. Retinopathy is a complication of diabetes (diabetes mellitus) and a common cause of bad eyesight (visual impairment). It can eventually cause blindness. Early detection and treatment of diabetic retinopathy is important in keeping your eyes healthy and preventing further damage to them. What are the causes? Diabetic retinopathy is caused by blood sugar (glucose) levels that are too high for an extended period of time. High blood glucose over an extended period of time can:  Damage small blood vessels in the retina, allowing blood to leak through the vessel walls.  Cause new, abnormal blood vessels to grow on the retina. This can scar the retina in the advanced stage of diabetic retinopathy. What increases the risk? You are more likely to develop this condition if:  You have had diabetes for a long time.  You have poorly controlled blood glucose.  You have high blood pressure. What are the signs or symptoms? In the early stages of diabetic retinopathy, there are often no symptoms. As the condition gets worse, symptoms may include:  Blurred vision. This is usually caused by swelling due to abnormal blood glucose levels. The blurriness may go away when blood glucose levels return to normal.  Moving specks or dark spots (floaters) in your vision. These can be caused by a small amount of bleeding (hemorrhage) from retinal blood vessels.  Missing parts of your field of vision, such as vision at the sides of the eyes. This can be caused by larger retinal hemorrhages.  Difficulty reading.  Double vision.  Pain in one or both eyes.  Feeling pressure in one or both eyes.  Trouble seeing straight lines. Straight lines may not look straight.  Redness of the eyes that does not go away. How is this diagnosed? This condition may be diagnosed with an eye exam in  which your eye care specialist puts drops in your eyes that enlarge (dilate) your pupils. This lets your health care provider examine your retina and check for changes in your retinal blood vessels. How is this treated? This condition may be treated by:  Keeping your blood glucose and blood pressure within a target range.  Using a type of laser beam to seal your retinal blood vessels. This stops them from bleeding and decreases pressure in your eye.  Getting shots of medicine in the eye to reduce swelling of the center of the retina (macula). You may be given: ? Anti-VEGF medicine. This medicine can help slow vision loss, and may even improve vision. ? Steroid medicine. Follow these instructions at home:   Follow your diabetes management plan as directed by your health care provider. This may include exercising regularly and eating a healthy diet.  Keep your blood glucose level and your blood pressure in your target range, as directed by your health care provider.  Check your blood glucose as often as directed.  Take over the counter and prescription medicines only as told by your health care provider. This includes insulin and oral diabetes medicine.  Get your eyes checked at least once every year. An eye specialist can usually see diabetic retinopathy developing long before it starts to cause problems. In many cases, it can be treated to prevent complications from occurring.  Do not use any products that contain nicotine or tobacco, such as cigarettes and e-cigarettes. If you need help quitting, ask your health care provider.  Keep all follow-up   visits as told by your health care provider. This is important. Contact a health care provider if:  You notice gradual blurring or other changes in your vision over time.  You notice that your glasses or contact lenses do not make things look as sharp as they once did.  You have trouble reading or seeing details at a distance with either  eye.  You notice a change in your vision or notice that parts of your field of vision appear missing or hazy.  You suddenly see moving specks or dark spots in the field of vision of either eye. Get help right away if:  You have sudden pain or pressure in one or both eyes.  You suddenly lose vision or a curtain or veil seems to come across your eyes.  You have a sudden burst of floaters in your vision. Summary  Diabetic retinopathy is a disease of the retina. The retina is a light-sensitive membrane at the back of the eye. Retinopathy is a complication of diabetes.  Get your eyes checked at least once every year. An eye specialist can usually see diabetic retinopathy developing long before it starts to cause problems. In many cases, it can be treated to prevent complications from occurring.  Keep your blood glucose and your blood pressure in target range. Follow your diabetes management plan as directed by your health care provider.  Protect your eyes. Wear sunglasses and eye protection when needed. This information is not intended to replace advice given to you by your health care provider. Make sure you discuss any questions you have with your health care provider. Document Revised: 08/23/2017 Document Reviewed: 08/15/2016 Elsevier Patient Education  [employer].  The nature of diabetic macular edema was discussed with the patient. Treatment options were outlined including medical therapy, laser & vitrectomy. The use of injectable medications reviewed, including Avastin, Lucentis, and Eylea. Periodic injections into the eye are likely to resolve diabetic macular edema (swelling in the center of vision). Initially, injections are delivered are delivered every 4-6 weeks, and the interval extended as the condition improves. On average, 8-9 injections the first year, and 5 in year 2. Improvement in the condition most often improves on medical therapy. Occasional use of focal laser is also  recommended for residual macular edema (swelling). Excellent control of blood glucose and blood pressure are encouraged under the care of a primary physician or endocrinologist. Similarly, attempts to maintain serum cholesterol, low density lipoproteins, and high-density lipoproteins in a favorable range were recommended.    Cataract  A cataract is a buildup of protein that causes the lens of your eye to become cloudy. The lens is normally clear. It is the part of the eye that is behind your iris and pupil. The lens focuses light on the retina, which lets you see clearly. When a lens becomes cloudy, your vision may become blurry. The clouding can range from a tiny dot to complete cloudiness. As some cataracts develop, they can make it harder for you to see things that are far away. (You become more nearsighted.) Other cataracts increase glare. Cataracts can worsen over time, and sometimes the pupil can look white. As cataracts get worse, they cloud more of the lens, making it difficult to see. Cataracts can affect one eye or both eyes. What are the causes? This condition may be caused by age-related eye changes. The lens of the eye is mostly made up of water and protein. Normally, this protein is arranged in a way  that keeps the lens clear. Cataracts develop when protein begins to clump together over time. This buildup of protein clouds the lens and lets less light pass through to the retina, which causes blurry vision. What increases the risk? You are more likely to develop this condition if you:  Are 70 years of age or older.  Have diabetes.  Have high blood pressure.  Take certain medicines, such as steroids or hormone replacement therapy.  Have had an eye injury.  Have or have had eye inflammation.  Have a family history of cataracts.  Smoke.  Drink alcohol heavily.  Are frequently exposed to sun or very strong light without eye protection.  Are obese.  Have been exposed to  large amounts of radiation, lead, or other toxic substances.  Have had eye surgery. What are the signs or symptoms? The main symptom of a cataract is blurry vision. Your vision may change or get worse over time. Other symptoms include:  Increased glare.  Seeing a bright ring or halo around light.  Poor night vision.  Double or "shadow" vision in one eye or both eyes.  Having trouble seeing, even while wearing contact lenses or glasses.  Seeing colors that appear faded.  Having trouble telling the difference between blue and purple.  Needing frequent changes to your prescription glasses or contacts. How is this diagnosed? This condition is diagnosed with a medical history and eye exam.  You should see an eye specialist (optometrist or ophthalmologist).  Your health care provider may enlarge (dilate) your pupils with eye drops to see the back of your eye more clearly and look for signs of cataracts or other eye damage. You may also have tests, including:  A visual acuity test. This uses a chart to determine the smallest letters that you can see from a specific distance.  A slit-lamp exam. This uses a microscope to examine small sections of your eye for abnormalities.  Tonometry. This test measures the pressure of the fluid inside your eye.  Glare testing. This test shines a light in your eye while you view letters to see whether the bright light affects your vision. How is this treated? Treatment depends on the stage of your cataract. You may:  Wear eyeglasses or use stronger light. This is for an early-stage cataract.  Have surgery if the condition is severely affecting your vision. This is needed for late-stage cataract.  Stop or change certain medicines. This is recommended if your health care provider thinks your cataract may be linked to your medicines. Follow these instructions at home: Lifestyle  Use stronger or brighter lighting.  Consider using a magnifying  glass for reading or other activities.  Become familiar with your surroundings. Having poor vision can put you at greater risk for tripping, falling, or bumping into things.  Wear sunglasses and a hat if you are sensitive to bright light or are having problems with glare.  Do not use any products that contain nicotine or tobacco, such as cigarettes, e-cigarettes, and chewing tobacco. If you need help quitting, ask your health care provider. General instructions  If you are prescribed new eyeglasses, wear them as told by your health care provider.  Take over-the-counter and prescription medicines only as told by your health care provider. Do not change your medicines unless told by your health care provider.  Do not drive or use heavy machinery if your vision is blurry, particularly at night.  Keep your blood sugar under control if you have diabetes.  Keep all follow-up visits as told by your health care provider. This is important. Contact a health care provider if:  Your symptoms get worse.  Your vision affects your ability to perform daily activities.  You have new symptoms.  You have a fever. Get help right away if:  You have sudden vision loss.  You have redness, swelling, or increasing pain in your eye.  You develop a headache and sensitivity to light. Summary  A cataract is a buildup of protein that causes the lens of your eye to become cloudy. Cataracts are very common, especially as people age.  Mild cataracts cause mild visual symptoms, while more severe cataracts can cause a significant decrease in quality of life.  Mild cataracts can often be treated with a prescription for new glasses or contact lenses, while surgery is often recommended for more severe cataracts.  Contact a health care provider if your symptoms get worse, your vision affects your ability to do daily activities, or you have a fever.  Get help right away if you have sudden vision loss, redness,  swelling, or increasing pain in the eye, or you develop a headache or sensitivity to light. This information is not intended to replace advice given to you by your health care provider. Make sure you discuss any questions you have with your health care provider. Document Revised: 01/08/2018 Document Reviewed: 01/08/2018 Elsevier Patient Education  [employer].

## 2019-10-30 NOTE — Progress Notes (Signed)
10/30/2019     CHIEF COMPLAINT Patient presents for Retina Follow Up   HISTORY OF PRESENT ILLNESS: Brian Herring is a 64 y.o. male who presents to the clinic today for:   HPI    Retina Follow Up    Patient presents with  Diabetic Retinopathy.  In left eye.  Duration of 5 weeks.  Since onset it is stable.          Comments    5 week /fu - OCT OU, Possible Eylea OS,, VISION NOT AS BLURRED, F/U OF CSME OS, OD   Patient denies change in vision and overall has no complaints. LBS 97 this AM A1C 7.4 'beginning of year'       Last edited by Hurman Horn, MD on 10/30/2019  4:16 PM. (History)      Referring physician: [name], MD [place],  [place] [address]  HISTORICAL INFORMATION:   Selected notes from the MEDICAL RECORD NUMBER    Lab Results  Component Value Date   HGBA1C 7.0 (H) 03/12/2018     CURRENT MEDICATIONS: No current outpatient medications on file. (Ophthalmic Drugs)   No current facility-administered medications for this visit. (Ophthalmic Drugs)   Current Outpatient Medications (Other)  Medication Sig  . aspirin EC 81 MG tablet Take 81 mg by mouth daily.  . blood glucose meter kit and supplies KIT Dispense based on patient and insurance preference. Use up to four times daily as directed. (FOR ICD-10: E 11.65).  Marland Kitchen ENTRESTO 97-103 MG TAKE 1 TABLET BY MOUTH TWICE DAILY  . furosemide (LASIX) 40 MG tablet Take 1 tablet (40 mg total) by mouth daily.  . ivabradine ([name]) 5 MG TABS tablet Take 1 tablet (5 mg total) by mouth 2 (two) times daily with a meal.  . JARDIANCE 25 MG TABS tablet Take 1 tablet by mouth daily.  . Lancets (ONETOUCH DELICA PLUS [id number]) MISC Use to test blood sugar daily  . metoprolol succinate (TOPROL-XL) 25 MG 24 hr tablet TAKE 1 AND 1/2 TABLETS BY MOUTH DAILY  . mupirocin ointment (BACTROBAN) 2 % Apply to affected area three times daily for 5 days  . ONE TOUCH ULTRA TEST test strip Use to test blood sugar  daily  . rosuvastatin (CRESTOR) 10 MG tablet Take 1 tablet (10 mg total) by mouth daily.  Marland Kitchen spironolactone (ALDACTONE) 25 MG tablet Take 1 tablet (25 mg total) by mouth daily.  Marland Kitchen glimepiride (AMARYL) 4 MG tablet Take 0.5 tablets (2 mg total) by mouth every morning. (Patient taking differently: Take 4 mg by mouth every morning. )   No current facility-administered medications for this visit. (Other)      REVIEW OF SYSTEMS:    ALLERGIES Allergies  Allergen Reactions  . Sulfur Rash  . Norvasc [Amlodipine Besylate] Rash  . Sulfa Antibiotics Rash    PAST MEDICAL HISTORY Past Medical History:  Diagnosis Date  . Arthritis    shoulder  . Cancer ([place])    skin cancer on nose  . Cardiomyopathy ([facility])   . Chronic systolic CHF (congestive heart failure) ([facility])   . Diabetes mellitus without complication ([facility])   . Hyperlipidemia   . Hypertension    Past Surgical History:  Procedure Laterality Date  . AMPUTATION Right 10/14/2017   Procedure: AMPUTATION  GREAT TOE;  Surgeon: [name], MD;  Location: [facility];  Service: Orthopedics;  Laterality: Right;  . AMPUTATION Right 10/18/2017   Procedure: RIGHT BELOW KNEE AMPUTATION;  Surgeon: [name], MD;  Location: [facility];  Service: Orthopedics;  Laterality: Right;  . CYSTOSCOPY WITH INSERTION OF UROLIFT N/A 03/15/2018   Procedure: CYSTOSCOPY WITH INSERTION OF UROLIFT;  Surgeon: Franchot Gallo, MD;  Location: [facility];  Service: Urology;  Laterality: N/A;  . SHOULDER SURGERY  01/05  . STUMP REVISION Right 12/15/2017   Procedure: REVISION RIGHT BELOW KNEE AMPUTATION;  Surgeon: [name], MD;  Location: [facility];  Service: Orthopedics;  Laterality: Right;    FAMILY HISTORY Family History  Problem Relation Age of Onset  . Pneumonia Mother        aspiration    SOCIAL HISTORY Social History   Tobacco Use  . Smoking status: Never Smoker  . Smokeless tobacco: Never Used  Substance Use Topics  . Alcohol use: No    Alcohol/week:  0.0 standard drinks  . Drug use: Never         OPHTHALMIC EXAM:  Base Eye Exam    Visual Acuity (Snellen - Linear)      Right Left   Dist cc 20/40 -2 20/70 +2   Dist ph cc 20/30 -2 20/60 -1   Correction: Glasses       Tonometry (Tonopen, 10:02 AM)      Right Left   Pressure 9 10       Pupils      Pupils Dark Light Shape React APD   Right PERRL 4 3 Round Slow None   Left PERRL 4 3 Round Slow None       Visual Fields (Counting fingers)      Left Right    Full Full       Extraocular Movement      Right Left    Full Full       Neuro/Psych    Oriented x3: Yes   Mood/Affect: Normal       Dilation    Left eye: 1.0% Mydriacyl, 2.5% Phenylephrine @ 10:09 AM        Slit Lamp and Fundus Exam    External Exam      Right Left   External Normal Normal       Slit Lamp Exam      Right Left   Lids/Lashes Normal Normal   Conjunctiva/Sclera White and quiet White and quiet   Cornea Clear Clear   Anterior Chamber Deep and quiet Deep and quiet   Iris Round and reactive Round and reactive   Lens 2+ Nuclear sclerosis 2+ Nuclear sclerosis   Vitreous Normal Normal       Fundus Exam      Right Left   Disc  Normal   C/D Ratio  0.2   Macula  Clinically significant macular edema, Mild clinically significant macular edema   Vessels  Retinopathy, very severe nonproliferative diabetic retinopathy   Periphery  Extensive peripheral retinal nonperfusion          IMAGING AND PROCEDURES  Imaging and Procedures for 10/30/19  OCT, Retina - OU - Both Eyes       Right Eye Quality was good. Scan locations included subfoveal.   Left Eye Quality was good. Scan locations included subfoveal.   Notes Macular thickening OU with clinically significant macular edema overall stable and slightly improved since last visit left eye some 5 weeks previous  Repeat Eylea OS today and exam OD as scheduled       Intravitreal Injection, Pharmacologic Agent - OS - Left Eye  Time Out 10/30/2019. 11:00 AM. Confirmed correct patient, procedure, site, and patient consented.   Anesthesia Topical anesthesia was used. Anesthetic medications included Akten 3.5%.   Procedure Preparation included 5% betadine to ocular surface, 10% betadine to eyelids. A 30 gauge needle was used.   Injection:  2 mg aflibercept Alfonse Flavors) SOLN   NDC: [id number], Lot: [id number]   Route: Intravitreal, Site: Left Eye, Waste: 0 mg  Post-op Post injection exam found visual acuity of at least counting fingers. The patient tolerated the procedure well. There were no complications. The patient received written and verbal post procedure care education.                 ASSESSMENT/PLAN:    ICD-10-CM   1. Severe nonproliferative diabetic retinopathy of right eye, with macular edema, associated with type 2 diabetes mellitus ([facility])  E11.3411   2. Severe nonproliferative diabetic retinopathy of left eye, with macular edema, associated with type 2 diabetes mellitus ([facility])  W73.7106 OCT, Retina - OU - Both Eyes    Intravitreal Injection, Pharmacologic Agent - OS - Left Eye    aflibercept (EYLEA) SOLN 2 mg  3. Nuclear sclerotic cataract of right eye  H25.11   4. Nuclear sclerotic cataract of left eye  H25.12     1.  2.  3.  Ophthalmic Meds Ordered this visit:  Meds ordered this encounter  Medications  . aflibercept (EYLEA) SOLN 2 mg       Return in about 3 weeks (around 11/20/2019) for dilate, OS, PRP.  Patient Instructions  Diabetic Retinopathy Diabetic retinopathy is a disease of the retina. The retina is a light-sensitive membrane at the back of the eye. Retinopathy is a complication of diabetes (diabetes mellitus) and a common cause of bad eyesight (visual impairment). It can eventually cause blindness. Early detection and treatment of diabetic retinopathy is important in keeping your eyes healthy and preventing further damage to them. What are the causes? Diabetic  retinopathy is caused by blood sugar (glucose) levels that are too high for an extended period of time. High blood glucose over an extended period of time can:  Damage small blood vessels in the retina, allowing blood to leak through the vessel walls.  Cause new, abnormal blood vessels to grow on the retina. This can scar the retina in the advanced stage of diabetic retinopathy. What increases the risk? You are more likely to develop this condition if:  You have had diabetes for a long time.  You have poorly controlled blood glucose.  You have high blood pressure. What are the signs or symptoms? In the early stages of diabetic retinopathy, there are often no symptoms. As the condition gets worse, symptoms may include:  Blurred vision. This is usually caused by swelling due to abnormal blood glucose levels. The blurriness may go away when blood glucose levels return to normal.  Moving specks or dark spots (floaters) in your vision. These can be caused by a small amount of bleeding (hemorrhage) from retinal blood vessels.  Missing parts of your field of vision, such as vision at the sides of the eyes. This can be caused by larger retinal hemorrhages.  Difficulty reading.  Double vision.  Pain in one or both eyes.  Feeling pressure in one or both eyes.  Trouble seeing straight lines. Straight lines may not look straight.  Redness of the eyes that does not go away. How is this diagnosed? This condition may be diagnosed with an eye exam in which your eye  care specialist puts drops in your eyes that enlarge (dilate) your pupils. This lets your health care provider examine your retina and check for changes in your retinal blood vessels. How is this treated? This condition may be treated by:  Keeping your blood glucose and blood pressure within a target range.  Using a type of laser beam to seal your retinal blood vessels. This stops them from bleeding and decreases pressure in your  eye.  Getting shots of medicine in the eye to reduce swelling of the center of the retina (macula). You may be given: ? Anti-VEGF medicine. This medicine can help slow vision loss, and may even improve vision. ? Steroid medicine. Follow these instructions at home:   Follow your diabetes management plan as directed by your health care provider. This may include exercising regularly and eating a healthy diet.  Keep your blood glucose level and your blood pressure in your target range, as directed by your health care provider.  Check your blood glucose as often as directed.  Take over the counter and prescription medicines only as told by your health care provider. This includes insulin and oral diabetes medicine.  Get your eyes checked at least once every year. An eye specialist can usually see diabetic retinopathy developing long before it starts to cause problems. In many cases, it can be treated to prevent complications from occurring.  Do not use any products that contain nicotine or tobacco, such as cigarettes and e-cigarettes. If you need help quitting, ask your health care provider.  Keep all follow-up visits as told by your health care provider. This is important. Contact a health care provider if:  You notice gradual blurring or other changes in your vision over time.  You notice that your glasses or contact lenses do not make things look as sharp as they once did.  You have trouble reading or seeing details at a distance with either eye.  You notice a change in your vision or notice that parts of your field of vision appear missing or hazy.  You suddenly see moving specks or dark spots in the field of vision of either eye. Get help right away if:  You have sudden pain or pressure in one or both eyes.  You suddenly lose vision or a curtain or veil seems to come across your eyes.  You have a sudden burst of floaters in your vision. Summary  Diabetic retinopathy is a  disease of the retina. The retina is a light-sensitive membrane at the back of the eye. Retinopathy is a complication of diabetes.  Get your eyes checked at least once every year. An eye specialist can usually see diabetic retinopathy developing long before it starts to cause problems. In many cases, it can be treated to prevent complications from occurring.  Keep your blood glucose and your blood pressure in target range. Follow your diabetes management plan as directed by your health care provider.  Protect your eyes. Wear sunglasses and eye protection when needed. This information is not intended to replace advice given to you by your health care provider. Make sure you discuss any questions you have with your health care provider. Document Revised: 08/23/2017 Document Reviewed: 08/15/2016 Elsevier Patient Education  [employer].  The nature of diabetic macular edema was discussed with the patient. Treatment options were outlined including medical therapy, laser & vitrectomy. The use of injectable medications reviewed, including Avastin, Lucentis, and Eylea. Periodic injections into the eye are likely to resolve diabetic  macular edema (swelling in the center of vision). Initially, injections are delivered are delivered every 4-6 weeks, and the interval extended as the condition improves. On average, 8-9 injections the first year, and 5 in year 2. Improvement in the condition most often improves on medical therapy. Occasional use of focal laser is also recommended for residual macular edema (swelling). Excellent control of blood glucose and blood pressure are encouraged under the care of a primary physician or endocrinologist. Similarly, attempts to maintain serum cholesterol, low density lipoproteins, and high-density lipoproteins in a favorable range were recommended.    Cataract  A cataract is a buildup of protein that causes the lens of your eye to become cloudy. The lens is normally  clear. It is the part of the eye that is behind your iris and pupil. The lens focuses light on the retina, which lets you see clearly. When a lens becomes cloudy, your vision may become blurry. The clouding can range from a tiny dot to complete cloudiness. As some cataracts develop, they can make it harder for you to see things that are far away. (You become more nearsighted.) Other cataracts increase glare. Cataracts can worsen over time, and sometimes the pupil can look white. As cataracts get worse, they cloud more of the lens, making it difficult to see. Cataracts can affect one eye or both eyes. What are the causes? This condition may be caused by age-related eye changes. The lens of the eye is mostly made up of water and protein. Normally, this protein is arranged in a way that keeps the lens clear. Cataracts develop when protein begins to clump together over time. This buildup of protein clouds the lens and lets less light pass through to the retina, which causes blurry vision. What increases the risk? You are more likely to develop this condition if you:  Are 87 years of age or older.  Have diabetes.  Have high blood pressure.  Take certain medicines, such as steroids or hormone replacement therapy.  Have had an eye injury.  Have or have had eye inflammation.  Have a family history of cataracts.  Smoke.  Drink alcohol heavily.  Are frequently exposed to sun or very strong light without eye protection.  Are obese.  Have been exposed to large amounts of radiation, lead, or other toxic substances.  Have had eye surgery. What are the signs or symptoms? The main symptom of a cataract is blurry vision. Your vision may change or get worse over time. Other symptoms include:  Increased glare.  Seeing a bright ring or halo around light.  Poor night vision.  Double or "shadow" vision in one eye or both eyes.  Having trouble seeing, even while wearing contact lenses or  glasses.  Seeing colors that appear faded.  Having trouble telling the difference between blue and purple.  Needing frequent changes to your prescription glasses or contacts. How is this diagnosed? This condition is diagnosed with a medical history and eye exam.  You should see an eye specialist (optometrist or ophthalmologist).  Your health care provider may enlarge (dilate) your pupils with eye drops to see the back of your eye more clearly and look for signs of cataracts or other eye damage. You may also have tests, including:  A visual acuity test. This uses a chart to determine the smallest letters that you can see from a specific distance.  A slit-lamp exam. This uses a microscope to examine small sections of your eye for abnormalities.  Tonometry.  This test measures the pressure of the fluid inside your eye.  Glare testing. This test shines a light in your eye while you view letters to see whether the bright light affects your vision. How is this treated? Treatment depends on the stage of your cataract. You may:  Wear eyeglasses or use stronger light. This is for an early-stage cataract.  Have surgery if the condition is severely affecting your vision. This is needed for late-stage cataract.  Stop or change certain medicines. This is recommended if your health care provider thinks your cataract may be linked to your medicines. Follow these instructions at home: Lifestyle  Use stronger or brighter lighting.  Consider using a magnifying glass for reading or other activities.  Become familiar with your surroundings. Having poor vision can put you at greater risk for tripping, falling, or bumping into things.  Wear sunglasses and a hat if you are sensitive to bright light or are having problems with glare.  Do not use any products that contain nicotine or tobacco, such as cigarettes, e-cigarettes, and chewing tobacco. If you need help quitting, ask your health care  provider. General instructions  If you are prescribed new eyeglasses, wear them as told by your health care provider.  Take over-the-counter and prescription medicines only as told by your health care provider. Do not change your medicines unless told by your health care provider.  Do not drive or use heavy machinery if your vision is blurry, particularly at night.  Keep your blood sugar under control if you have diabetes.  Keep all follow-up visits as told by your health care provider. This is important. Contact a health care provider if:  Your symptoms get worse.  Your vision affects your ability to perform daily activities.  You have new symptoms.  You have a fever. Get help right away if:  You have sudden vision loss.  You have redness, swelling, or increasing pain in your eye.  You develop a headache and sensitivity to light. Summary  A cataract is a buildup of protein that causes the lens of your eye to become cloudy. Cataracts are very common, especially as people age.  Mild cataracts cause mild visual symptoms, while more severe cataracts can cause a significant decrease in quality of life.  Mild cataracts can often be treated with a prescription for new glasses or contact lenses, while surgery is often recommended for more severe cataracts.  Contact a health care provider if your symptoms get worse, your vision affects your ability to do daily activities, or you have a fever.  Get help right away if you have sudden vision loss, redness, swelling, or increasing pain in the eye, or you develop a headache or sensitivity to light. This information is not intended to replace advice given to you by your health care provider. Make sure you discuss any questions you have with your health care provider. Document Revised: 01/08/2018 Document Reviewed: 01/08/2018 Elsevier Patient Education  [employer] the diagnoses, plan, and follow up with the  patient and they expressed understanding.  Patient expressed understanding of the importance of proper follow up care.   Clent Demark Jaran Sainz M.D. Diseases & Surgery of the Retina and Vitreous [facility] 10/30/19     Abbreviations: M myopia (nearsighted); A astigmatism; H hyperopia (farsighted); P presbyopia; Mrx spectacle prescription;  CTL contact lenses; OD right eye; OS left eye; OU both eyes  XT exotropia; ET esotropia; PEK punctate epithelial  keratitis; PEE punctate epithelial erosions; DES dry eye syndrome; MGD meibomian gland dysfunction; ATs artificial tears; PFAT's preservative free artificial tears; [place] nuclear sclerotic cataract; PSC posterior subcapsular cataract; ERM epi-retinal membrane; PVD posterior vitreous detachment; RD retinal detachment; DM diabetes mellitus; DR diabetic retinopathy; NPDR non-proliferative diabetic retinopathy; PDR proliferative diabetic retinopathy; CSME clinically significant macular edema; DME diabetic macular edema; dbh dot blot hemorrhages; CWS cotton wool spot; POAG primary open angle glaucoma; C/D cup-to-disc ratio; HVF humphrey visual field; GVF goldmann visual field; OCT optical coherence tomography; IOP intraocular pressure; BRVO Branch retinal vein occlusion; CRVO central retinal vein occlusion; CRAO central retinal artery occlusion; BRAO branch retinal artery occlusion; RT retinal tear; SB scleral buckle; PPV pars plana vitrectomy; VH Vitreous hemorrhage; PRP panretinal laser photocoagulation; IVK intravitreal kenalog; VMT vitreomacular traction; MH Macular hole;  NVD neovascularization of the disc; NVE neovascularization elsewhere; AREDS age related eye disease study; ARMD age related macular degeneration; POAG primary open angle glaucoma; EBMD epithelial/anterior basement membrane dystrophy; ACIOL anterior chamber intraocular lens; IOL intraocular lens; PCIOL posterior chamber intraocular lens; Phaco/IOL phacoemulsification with intraocular  lens placement; PRK photorefractive keratectomy; LASIK laser assisted in situ keratomileusis; HTN hypertension; DM diabetes mellitus; COPD chronic obstructive pulmonary disease   10/30/2019     CHIEF COMPLAINT Patient presents for Retina Follow Up   HISTORY OF PRESENT ILLNESS: Brian Herring is a 64 y.o. male who presents to the clinic today for:   HPI    Retina Follow Up    Patient presents with  Diabetic Retinopathy.  In left eye.  Duration of 5 weeks.  Since onset it is stable.          Comments    5 week /fu - OCT OU, Possible Eylea OS,, VISION NOT AS BLURRED, F/U OF CSME OS, OD   Patient denies change in vision and overall has no complaints. LBS 97 this AM A1C 7.4 'beginning of year'       Last edited by Hurman Horn, MD on 10/30/2019  4:16 PM. (History)      Referring physician: [name], MD [place],  [place] [id number]  HISTORICAL INFORMATION:   Selected notes from the MEDICAL RECORD NUMBER    Lab Results  Component Value Date   HGBA1C 7.0 (H) 03/12/2018     CURRENT MEDICATIONS: No current outpatient medications on file. (Ophthalmic Drugs)   No current facility-administered medications for this visit. (Ophthalmic Drugs)   Current Outpatient Medications (Other)  Medication Sig  . aspirin EC 81 MG tablet Take 81 mg by mouth daily.  . blood glucose meter kit and supplies KIT Dispense based on patient and insurance preference. Use up to four times daily as directed. (FOR ICD-10: E 11.65).  Marland Kitchen ENTRESTO 97-103 MG TAKE 1 TABLET BY MOUTH TWICE DAILY  . furosemide (LASIX) 40 MG tablet Take 1 tablet (40 mg total) by mouth daily.  . ivabradine ([name]) 5 MG TABS tablet Take 1 tablet (5 mg total) by mouth 2 (two) times daily with a meal.  . JARDIANCE 25 MG TABS tablet Take 1 tablet by mouth daily.  . Lancets (ONETOUCH DELICA PLUS [id number]) MISC Use to test blood sugar daily  . metoprolol succinate (TOPROL-XL) 25 MG 24 hr tablet TAKE 1 AND  1/2 TABLETS BY MOUTH DAILY  . mupirocin ointment (BACTROBAN) 2 % Apply to affected area three times daily for 5 days  . ONE TOUCH ULTRA TEST test strip Use to test blood sugar daily  . rosuvastatin (  CRESTOR) 10 MG tablet Take 1 tablet (10 mg total) by mouth daily.  Marland Kitchen spironolactone (ALDACTONE) 25 MG tablet Take 1 tablet (25 mg total) by mouth daily.  Marland Kitchen glimepiride (AMARYL) 4 MG tablet Take 0.5 tablets (2 mg total) by mouth every morning. (Patient taking differently: Take 4 mg by mouth every morning. )   No current facility-administered medications for this visit. (Other)      REVIEW OF SYSTEMS:    ALLERGIES Allergies  Allergen Reactions  . Sulfur Rash  . Norvasc [Amlodipine Besylate] Rash  . Sulfa Antibiotics Rash    PAST MEDICAL HISTORY Past Medical History:  Diagnosis Date  . Arthritis    shoulder  . Cancer ([facility])    skin cancer on nose  . Cardiomyopathy ([facility])   . Chronic systolic CHF (congestive heart failure) ([facility])   . Diabetes mellitus without complication ([facility])   . Hyperlipidemia   . Hypertension    Past Surgical History:  Procedure Laterality Date  . AMPUTATION Right 10/14/2017   Procedure: AMPUTATION  GREAT TOE;  Surgeon: [name], MD;  Location: [place];  Service: Orthopedics;  Laterality: Right;  . AMPUTATION Right 10/18/2017   Procedure: RIGHT BELOW KNEE AMPUTATION;  Surgeon: [name], MD;  Location: [place];  Service: Orthopedics;  Laterality: Right;  . CYSTOSCOPY WITH INSERTION OF UROLIFT N/A 03/15/2018   Procedure: CYSTOSCOPY WITH INSERTION OF UROLIFT;  Surgeon: Franchot Gallo, MD;  Location: [facility];  Service: Urology;  Laterality: N/A;  . SHOULDER SURGERY  01/05  . STUMP REVISION Right 12/15/2017   Procedure: REVISION RIGHT BELOW KNEE AMPUTATION;  Surgeon: [name], MD;  Location: [place];  Service: Orthopedics;  Laterality: Right;    FAMILY HISTORY Family History  Problem Relation Age of Onset  . Pneumonia Mother        aspiration     SOCIAL HISTORY Social History   Tobacco Use  . Smoking status: Never Smoker  . Smokeless tobacco: Never Used  Substance Use Topics  . Alcohol use: No    Alcohol/week: 0.0 standard drinks  . Drug use: Never         OPHTHALMIC EXAM:  Base Eye Exam    Visual Acuity (Snellen - Linear)      Right Left   Dist cc 20/40 -2 20/70 +2   Dist ph cc 20/30 -2 20/60 -1   Correction: Glasses       Tonometry (Tonopen, 10:02 AM)      Right Left   Pressure 9 10       Pupils      Pupils Dark Light Shape React APD   Right PERRL 4 3 Round Slow None   Left PERRL 4 3 Round Slow None       Visual Fields (Counting fingers)      Left Right    Full Full       Extraocular Movement      Right Left    Full Full       Neuro/Psych    Oriented x3: Yes   Mood/Affect: Normal       Dilation    Left eye: 1.0% Mydriacyl, 2.5% Phenylephrine @ 10:09 AM        Slit Lamp and Fundus Exam    External Exam      Right Left   External Normal Normal       Slit Lamp Exam      Right Left   Lids/Lashes Normal Normal  Conjunctiva/Sclera White and quiet White and quiet   Cornea Clear Clear   Anterior Chamber Deep and quiet Deep and quiet   Iris Round and reactive Round and reactive   Lens 2+ Nuclear sclerosis 2+ Nuclear sclerosis   Vitreous Normal Normal       Fundus Exam      Right Left   Disc  Normal   C/D Ratio  0.2   Macula  Clinically significant macular edema, Mild clinically significant macular edema   Vessels  Retinopathy, very severe nonproliferative diabetic retinopathy   Periphery  Extensive peripheral retinal nonperfusion          IMAGING AND PROCEDURES  Imaging and Procedures for 10/30/19  OCT, Retina - OU - Both Eyes       Right Eye Quality was good. Scan locations included subfoveal.   Left Eye Quality was good. Scan locations included subfoveal.   Notes Macular thickening OU with clinically significant macular edema overall stable and slightly  improved since last visit left eye some 5 weeks previous  Repeat Eylea OS today and exam OD as scheduled       Intravitreal Injection, Pharmacologic Agent - OS - Left Eye       Time Out 10/30/2019. 11:00 AM. Confirmed correct patient, procedure, site, and patient consented.   Anesthesia Topical anesthesia was used. Anesthetic medications included Akten 3.5%.   Procedure Preparation included 5% betadine to ocular surface, 10% betadine to eyelids. A 30 gauge needle was used.   Injection:  2 mg aflibercept Alfonse Flavors) SOLN   NDC: [id number], Lot: [id number]   Route: Intravitreal, Site: Left Eye, Waste: 0 mg  Post-op Post injection exam found visual acuity of at least counting fingers. The patient tolerated the procedure well. There were no complications. The patient received written and verbal post procedure care education.                 ASSESSMENT/PLAN:    ICD-10-CM   1. Severe nonproliferative diabetic retinopathy of right eye, with macular edema, associated with type 2 diabetes mellitus ([place])  E11.3411   2. Severe nonproliferative diabetic retinopathy of left eye, with macular edema, associated with type 2 diabetes mellitus ([facility])  N82.9562 OCT, Retina - OU - Both Eyes    Intravitreal Injection, Pharmacologic Agent - OS - Left Eye    aflibercept (EYLEA) SOLN 2 mg  3. Nuclear sclerotic cataract of right eye  H25.11   4. Nuclear sclerotic cataract of left eye  H25.12     1.  2.  3.  Ophthalmic Meds Ordered this visit:  Meds ordered this encounter  Medications  . aflibercept (EYLEA) SOLN 2 mg       Return in about 3 weeks (around 11/20/2019) for dilate, OS, PRP.  Patient Instructions  Diabetic Retinopathy Diabetic retinopathy is a disease of the retina. The retina is a light-sensitive membrane at the back of the eye. Retinopathy is a complication of diabetes (diabetes mellitus) and a common cause of bad eyesight (visual impairment). It can eventually cause  blindness. Early detection and treatment of diabetic retinopathy is important in keeping your eyes healthy and preventing further damage to them. What are the causes? Diabetic retinopathy is caused by blood sugar (glucose) levels that are too high for an extended period of time. High blood glucose over an extended period of time can:  Damage small blood vessels in the retina, allowing blood to leak through the vessel walls.  Cause new, abnormal blood vessels to grow on  the retina. This can scar the retina in the advanced stage of diabetic retinopathy. What increases the risk? You are more likely to develop this condition if:  You have had diabetes for a long time.  You have poorly controlled blood glucose.  You have high blood pressure. What are the signs or symptoms? In the early stages of diabetic retinopathy, there are often no symptoms. As the condition gets worse, symptoms may include:  Blurred vision. This is usually caused by swelling due to abnormal blood glucose levels. The blurriness may go away when blood glucose levels return to normal.  Moving specks or dark spots (floaters) in your vision. These can be caused by a small amount of bleeding (hemorrhage) from retinal blood vessels.  Missing parts of your field of vision, such as vision at the sides of the eyes. This can be caused by larger retinal hemorrhages.  Difficulty reading.  Double vision.  Pain in one or both eyes.  Feeling pressure in one or both eyes.  Trouble seeing straight lines. Straight lines may not look straight.  Redness of the eyes that does not go away. How is this diagnosed? This condition may be diagnosed with an eye exam in which your eye care specialist puts drops in your eyes that enlarge (dilate) your pupils. This lets your health care provider examine your retina and check for changes in your retinal blood vessels. How is this treated? This condition may be treated by:  Keeping your blood  glucose and blood pressure within a target range.  Using a type of laser beam to seal your retinal blood vessels. This stops them from bleeding and decreases pressure in your eye.  Getting shots of medicine in the eye to reduce swelling of the center of the retina (macula). You may be given: ? Anti-VEGF medicine. This medicine can help slow vision loss, and may even improve vision. ? Steroid medicine. Follow these instructions at home:   Follow your diabetes management plan as directed by your health care provider. This may include exercising regularly and eating a healthy diet.  Keep your blood glucose level and your blood pressure in your target range, as directed by your health care provider.  Check your blood glucose as often as directed.  Take over the counter and prescription medicines only as told by your health care provider. This includes insulin and oral diabetes medicine.  Get your eyes checked at least once every year. An eye specialist can usually see diabetic retinopathy developing long before it starts to cause problems. In many cases, it can be treated to prevent complications from occurring.  Do not use any products that contain nicotine or tobacco, such as cigarettes and e-cigarettes. If you need help quitting, ask your health care provider.  Keep all follow-up visits as told by your health care provider. This is important. Contact a health care provider if:  You notice gradual blurring or other changes in your vision over time.  You notice that your glasses or contact lenses do not make things look as sharp as they once did.  You have trouble reading or seeing details at a distance with either eye.  You notice a change in your vision or notice that parts of your field of vision appear missing or hazy.  You suddenly see moving specks or dark spots in the field of vision of either eye. Get help right away if:  You have sudden pain or pressure in one or both  eyes.  You  suddenly lose vision or a curtain or veil seems to come across your eyes.  You have a sudden burst of floaters in your vision. Summary  Diabetic retinopathy is a disease of the retina. The retina is a light-sensitive membrane at the back of the eye. Retinopathy is a complication of diabetes.  Get your eyes checked at least once every year. An eye specialist can usually see diabetic retinopathy developing long before it starts to cause problems. In many cases, it can be treated to prevent complications from occurring.  Keep your blood glucose and your blood pressure in target range. Follow your diabetes management plan as directed by your health care provider.  Protect your eyes. Wear sunglasses and eye protection when needed. This information is not intended to replace advice given to you by your health care provider. Make sure you discuss any questions you have with your health care provider. Document Revised: 08/23/2017 Document Reviewed: 08/15/2016 Elsevier Patient Education  [employer].  The nature of diabetic macular edema was discussed with the patient. Treatment options were outlined including medical therapy, laser & vitrectomy. The use of injectable medications reviewed, including Avastin, Lucentis, and Eylea. Periodic injections into the eye are likely to resolve diabetic macular edema (swelling in the center of vision). Initially, injections are delivered are delivered every 4-6 weeks, and the interval extended as the condition improves. On average, 8-9 injections the first year, and 5 in year 2. Improvement in the condition most often improves on medical therapy. Occasional use of focal laser is also recommended for residual macular edema (swelling). Excellent control of blood glucose and blood pressure are encouraged under the care of a primary physician or endocrinologist. Similarly, attempts to maintain serum cholesterol, low density lipoproteins, and high-density  lipoproteins in a favorable range were recommended.    Cataract  A cataract is a buildup of protein that causes the lens of your eye to become cloudy. The lens is normally clear. It is the part of the eye that is behind your iris and pupil. The lens focuses light on the retina, which lets you see clearly. When a lens becomes cloudy, your vision may become blurry. The clouding can range from a tiny dot to complete cloudiness. As some cataracts develop, they can make it harder for you to see things that are far away. (You become more nearsighted.) Other cataracts increase glare. Cataracts can worsen over time, and sometimes the pupil can look white. As cataracts get worse, they cloud more of the lens, making it difficult to see. Cataracts can affect one eye or both eyes. What are the causes? This condition may be caused by age-related eye changes. The lens of the eye is mostly made up of water and protein. Normally, this protein is arranged in a way that keeps the lens clear. Cataracts develop when protein begins to clump together over time. This buildup of protein clouds the lens and lets less light pass through to the retina, which causes blurry vision. What increases the risk? You are more likely to develop this condition if you:  Are 110 years of age or older.  Have diabetes.  Have high blood pressure.  Take certain medicines, such as steroids or hormone replacement therapy.  Have had an eye injury.  Have or have had eye inflammation.  Have a family history of cataracts.  Smoke.  Drink alcohol heavily.  Are frequently exposed to sun or very strong light without eye protection.  Are obese.  Have been exposed  to large amounts of radiation, lead, or other toxic substances.  Have had eye surgery. What are the signs or symptoms? The main symptom of a cataract is blurry vision. Your vision may change or get worse over time. Other symptoms include:  Increased glare.  Seeing a  bright ring or halo around light.  Poor night vision.  Double or "shadow" vision in one eye or both eyes.  Having trouble seeing, even while wearing contact lenses or glasses.  Seeing colors that appear faded.  Having trouble telling the difference between blue and purple.  Needing frequent changes to your prescription glasses or contacts. How is this diagnosed? This condition is diagnosed with a medical history and eye exam.  You should see an eye specialist (optometrist or ophthalmologist).  Your health care provider may enlarge (dilate) your pupils with eye drops to see the back of your eye more clearly and look for signs of cataracts or other eye damage. You may also have tests, including:  A visual acuity test. This uses a chart to determine the smallest letters that you can see from a specific distance.  A slit-lamp exam. This uses a microscope to examine small sections of your eye for abnormalities.  Tonometry. This test measures the pressure of the fluid inside your eye.  Glare testing. This test shines a light in your eye while you view letters to see whether the bright light affects your vision. How is this treated? Treatment depends on the stage of your cataract. You may:  Wear eyeglasses or use stronger light. This is for an early-stage cataract.  Have surgery if the condition is severely affecting your vision. This is needed for late-stage cataract.  Stop or change certain medicines. This is recommended if your health care provider thinks your cataract may be linked to your medicines. Follow these instructions at home: Lifestyle  Use stronger or brighter lighting.  Consider using a magnifying glass for reading or other activities.  Become familiar with your surroundings. Having poor vision can put you at greater risk for tripping, falling, or bumping into things.  Wear sunglasses and a hat if you are sensitive to bright light or are having problems with  glare.  Do not use any products that contain nicotine or tobacco, such as cigarettes, e-cigarettes, and chewing tobacco. If you need help quitting, ask your health care provider. General instructions  If you are prescribed new eyeglasses, wear them as told by your health care provider.  Take over-the-counter and prescription medicines only as told by your health care provider. Do not change your medicines unless told by your health care provider.  Do not drive or use heavy machinery if your vision is blurry, particularly at night.  Keep your blood sugar under control if you have diabetes.  Keep all follow-up visits as told by your health care provider. This is important. Contact a health care provider if:  Your symptoms get worse.  Your vision affects your ability to perform daily activities.  You have new symptoms.  You have a fever. Get help right away if:  You have sudden vision loss.  You have redness, swelling, or increasing pain in your eye.  You develop a headache and sensitivity to light. Summary  A cataract is a buildup of protein that causes the lens of your eye to become cloudy. Cataracts are very common, especially as people age.  Mild cataracts cause mild visual symptoms, while more severe cataracts can cause a significant decrease in quality of  life.  Mild cataracts can often be treated with a prescription for new glasses or contact lenses, while surgery is often recommended for more severe cataracts.  Contact a health care provider if your symptoms get worse, your vision affects your ability to do daily activities, or you have a fever.  Get help right away if you have sudden vision loss, redness, swelling, or increasing pain in the eye, or you develop a headache or sensitivity to light. This information is not intended to replace advice given to you by your health care provider. Make sure you discuss any questions you have with your health care  provider. Document Revised: 01/08/2018 Document Reviewed: 01/08/2018 Elsevier Patient Education  [place] the diagnoses, plan, and follow up with the patient and they expressed understanding.  Patient expressed understanding of the importance of proper follow up care.   Clent Demark Andreana Klingerman M.D. Diseases & Surgery of the Retina and Vitreous [facility] 10/30/19     Abbreviations: M myopia (nearsighted); A astigmatism; H hyperopia (farsighted); P presbyopia; Mrx spectacle prescription;  CTL contact lenses; OD right eye; OS left eye; OU both eyes  XT exotropia; ET esotropia; PEK punctate epithelial keratitis; PEE punctate epithelial erosions; DES dry eye syndrome; MGD meibomian gland dysfunction; ATs artificial tears; PFAT's preservative free artificial tears; [place] nuclear sclerotic cataract; PSC posterior subcapsular cataract; ERM epi-retinal membrane; PVD posterior vitreous detachment; RD retinal detachment; DM diabetes mellitus; DR diabetic retinopathy; NPDR non-proliferative diabetic retinopathy; PDR proliferative diabetic retinopathy; CSME clinically significant macular edema; DME diabetic macular edema; dbh dot blot hemorrhages; CWS cotton wool spot; POAG primary open angle glaucoma; C/D cup-to-disc ratio; HVF humphrey visual field; GVF goldmann visual field; OCT optical coherence tomography; IOP intraocular pressure; BRVO Branch retinal vein occlusion; CRVO central retinal vein occlusion; CRAO central retinal artery occlusion; BRAO branch retinal artery occlusion; RT retinal tear; SB scleral buckle; PPV pars plana vitrectomy; VH Vitreous hemorrhage; PRP panretinal laser photocoagulation; IVK intravitreal kenalog; VMT vitreomacular traction; MH Macular hole;  NVD neovascularization of the disc; NVE neovascularization elsewhere; AREDS age related eye disease study; ARMD age related macular degeneration; POAG primary open angle glaucoma; EBMD epithelial/anterior  basement membrane dystrophy; ACIOL anterior chamber intraocular lens; IOL intraocular lens; PCIOL posterior chamber intraocular lens; Phaco/IOL phacoemulsification with intraocular lens placement; [place] photorefractive keratectomy; LASIK laser assisted in situ keratomileusis; HTN hypertension; DM diabetes mellitus; COPD chronic obstructive pulmonary disease

## 2019-11-01 ENCOUNTER — Ambulatory Visit: Payer: PPO

## 2019-11-01 ENCOUNTER — Other Ambulatory Visit: Payer: Self-pay

## 2019-11-01 DIAGNOSIS — I428 Other cardiomyopathies: Secondary | ICD-10-CM

## 2019-11-04 ENCOUNTER — Encounter (INDEPENDENT_AMBULATORY_CARE_PROVIDER_SITE_OTHER): Payer: Self-pay | Admitting: Ophthalmology

## 2019-11-04 ENCOUNTER — Other Ambulatory Visit: Payer: Self-pay

## 2019-11-04 ENCOUNTER — Ambulatory Visit (INDEPENDENT_AMBULATORY_CARE_PROVIDER_SITE_OTHER): Payer: PPO | Admitting: Ophthalmology

## 2019-11-04 DIAGNOSIS — E1169 Type 2 diabetes mellitus with other specified complication: Secondary | ICD-10-CM | POA: Diagnosis not present

## 2019-11-04 DIAGNOSIS — E7800 Pure hypercholesterolemia, unspecified: Secondary | ICD-10-CM | POA: Diagnosis not present

## 2019-11-04 DIAGNOSIS — I10 Essential (primary) hypertension: Secondary | ICD-10-CM | POA: Diagnosis not present

## 2019-11-04 DIAGNOSIS — E113411 Type 2 diabetes mellitus with severe nonproliferative diabetic retinopathy with macular edema, right eye: Secondary | ICD-10-CM

## 2019-11-04 MED ORDER — AFLIBERCEPT 2MG/0.05ML IZ SOLN FOR KALEIDOSCOPE
2.0000 mg | INTRAVITREAL | Status: AC | PRN
  Administered 2019-11-04: 12:00:00 2 mg via INTRAVITREAL

## 2019-11-04 NOTE — Progress Notes (Signed)
11/04/2019     CHIEF COMPLAINT Patient presents for Retina Follow Up   HISTORY OF PRESENT ILLNESS: Brian Herring is a 64 y.o. male who presents to the clinic today for:   HPI    Retina Follow Up    Patient presents with  Diabetic Retinopathy.  In right eye.  This started 5 weeks ago.  Severity is mild.  Duration of 5 weeks.  Since onset it is stable.          Comments    5 Week Diabetic Exam OD, poss Eylea OD  Pt denies noticeable changes to [place] OU since last visit. Pt denies ocular pain, flashes of light, or floaters OU.  LBS: 113 this AM       Last edited by Rockie Neighbours, [place] on 11/04/2019  9:45 AM. (History)      Referring physician: [name], MD [place],  [place] [id number]  HISTORICAL INFORMATION:   Selected notes from the MEDICAL RECORD NUMBER    Lab Results  Component Value Date   HGBA1C 7.0 (H) 03/12/2018     CURRENT MEDICATIONS: No current outpatient medications on file. (Ophthalmic Drugs)   No current facility-administered medications for this visit. (Ophthalmic Drugs)   Current Outpatient Medications (Other)  Medication Sig  . aspirin EC 81 MG tablet Take 81 mg by mouth daily.  . blood glucose meter kit and supplies KIT Dispense based on patient and insurance preference. Use up to four times daily as directed. (FOR ICD-10: E 11.65).  Marland Kitchen ENTRESTO 97-103 MG TAKE 1 TABLET BY MOUTH TWICE DAILY  . furosemide (LASIX) 40 MG tablet Take 1 tablet (40 mg total) by mouth daily.  Marland Kitchen glimepiride (AMARYL) 4 MG tablet Take 0.5 tablets (2 mg total) by mouth every morning. (Patient taking differently: Take 4 mg by mouth every morning. )  . ivabradine ([name]) 5 MG TABS tablet Take 1 tablet (5 mg total) by mouth 2 (two) times daily with a meal.  . JARDIANCE 25 MG TABS tablet Take 1 tablet by mouth daily.  . Lancets (ONETOUCH DELICA PLUS [id number]) MISC Use to test blood sugar daily  . metoprolol succinate (TOPROL-XL) 25 MG 24 hr tablet  TAKE 1 AND 1/2 TABLETS BY MOUTH DAILY  . mupirocin ointment (BACTROBAN) 2 % Apply to affected area three times daily for 5 days  . ONE TOUCH ULTRA TEST test strip Use to test blood sugar daily  . rosuvastatin (CRESTOR) 10 MG tablet Take 1 tablet (10 mg total) by mouth daily.  Marland Kitchen spironolactone (ALDACTONE) 25 MG tablet Take 1 tablet (25 mg total) by mouth daily.   No current facility-administered medications for this visit. (Other)      REVIEW OF SYSTEMS: ROS    Negative for: Constitutional, Gastrointestinal, Neurological, Skin, Genitourinary, Musculoskeletal, HENT, Endocrine, Cardiovascular, Eyes, Respiratory, Psychiatric, Allergic/Imm, Heme/Lymph   Last edited by Hurman Horn, MD on 11/04/2019 11:19 AM. (History)       ALLERGIES Allergies  Allergen Reactions  . Sulfur Rash  . Norvasc [Amlodipine Besylate] Rash  . Sulfa Antibiotics Rash    PAST MEDICAL HISTORY Past Medical History:  Diagnosis Date  . Arthritis    shoulder  . Cancer ([facility])    skin cancer on nose  . Cardiomyopathy ([place])   . Chronic systolic CHF (congestive heart failure) ([place])   . Diabetes mellitus without complication ([place])   . Hyperlipidemia   . Hypertension    Past Surgical History:  Procedure Laterality  Date  . AMPUTATION Right 10/14/2017   Procedure: AMPUTATION  GREAT TOE;  Surgeon: [name], MD;  Location: [facility];  Service: Orthopedics;  Laterality: Right;  . AMPUTATION Right 10/18/2017   Procedure: RIGHT BELOW KNEE AMPUTATION;  Surgeon: [name], MD;  Location: [facility];  Service: Orthopedics;  Laterality: Right;  . CYSTOSCOPY WITH INSERTION OF UROLIFT N/A 03/15/2018   Procedure: CYSTOSCOPY WITH INSERTION OF UROLIFT;  Surgeon: Franchot Gallo, MD;  Location: [facility];  Service: Urology;  Laterality: N/A;  . SHOULDER SURGERY  01/05  . STUMP REVISION Right 12/15/2017   Procedure: REVISION RIGHT BELOW KNEE AMPUTATION;  Surgeon: [name], MD;  Location: [facility];  Service: Orthopedics;   Laterality: Right;    FAMILY HISTORY Family History  Problem Relation Age of Onset  . Pneumonia Mother        aspiration    SOCIAL HISTORY Social History   Tobacco Use  . Smoking status: Never Smoker  . Smokeless tobacco: Never Used  Substance Use Topics  . Alcohol use: No    Alcohol/week: 0.0 standard drinks  . Drug use: Never         OPHTHALMIC EXAM:  Base Eye Exam    Visual Acuity (Snellen - Linear)      Right Left   Dist cc 20/50 +2 20/60 +2   Dist ph cc 20/30 -2 20/40 -2   Correction: Glasses       Tonometry (Tonopen, 9:52 AM)      Right Left   Pressure 13 12       Pupils      Pupils Dark Light Shape React APD   Right PERRL 4 3 Round Brisk None   Left PERRL 4 3 Round Brisk None       Visual Fields (Counting fingers)      Left Right    Full Full       Extraocular Movement      Right Left    Full Full       Neuro/Psych    Oriented x3: Yes   Mood/Affect: Normal       Dilation    Right eye: 1.0% Mydriacyl, 2.5% Phenylephrine @ 9:52 AM        Slit Lamp and Fundus Exam    External Exam      Right Left   External Normal Normal       Slit Lamp Exam      Right Left   Lids/Lashes Normal Normal   Conjunctiva/Sclera White and quiet White and quiet   Cornea Clear Clear   Anterior Chamber Deep and quiet Deep and quiet   Iris Round and reactive Round and reactive   Lens 2+ Nuclear sclerosis 2+ Nuclear sclerosis   Anterior Vitreous Normal Normal       Fundus Exam      Right Left   Posterior Vitreous Posterior vitreous detachment    Disc Normal    C/D Ratio 0.2    Macula Clinically significant macular edema, Mild clinically significant macular edema, Hemorrhage, Microaneurysms, Macular thickening    Vessels Severe nonproliferative diabetic retinopathy    Periphery Normal           IMAGING AND PROCEDURES  Imaging and Procedures for _0 @  OCT, Retina - OU - Both Eyes       Right Eye Quality was good. Scan locations included  subfoveal. Progression has been stable.   Left Eye Quality was good. Scan locations included subfoveal.  Progression has been stable.   Notes CS M E     OD and OS now stable.  Left eye only 5 days status post recent Eylea.     Will repeat Eylea OD today.       Intravitreal Injection, Pharmacologic Agent - OD - Right Eye       Time Out 11/04/2019. 11:38 AM. Confirmed correct patient, procedure, site, and patient consented.   Anesthesia Anesthetic medications included [place] 3.5%.   Procedure Preparation included Ofloxacin , 10% betadine to eyelids. A 30 gauge needle was used.   Injection:  2 mg aflibercept Alfonse Flavors) SOLN   NDC: [id number], Lot: [id number]   Route: Intravitreal, Site: Right Eye, Waste: 0 mg  Post-op Post injection exam found visual acuity of at least counting fingers. The patient tolerated the procedure well. There were no complications. The patient received written and verbal post procedure care education. Post injection medications were not given.                 ASSESSMENT/PLAN:  _0 @    ICD-10-CM   1. Severe nonproliferative diabetic retinopathy of right eye, with macular edema, associated with type 2 diabetes mellitus (HCC)  E11.3411 OCT, Retina - OU - Both Eyes    Intravitreal Injection, Pharmacologic Agent - OD - Right Eye    aflibercept (EYLEA) SOLN 2 mg    1.  2.  3.  Ophthalmic Meds Ordered this visit:  Meds ordered this encounter  Medications  . aflibercept (EYLEA) SOLN 2 mg       Return in about 5 weeks (around 12/09/2019) for DILATE OU, OPTOS FFA R/L, EYLEA OCT, OD.  Patient Instructions   The nature of diabetic macular edema was discussed with the patient. Treatment options were outlined including medical therapy, laser & vitrectomy. The use of injectable medications reviewed, including Avastin, Lucentis, and Eylea. Periodic injections into the eye are likely to resolve diabetic macular edema (swelling in the  center of vision). Initially, injections are delivered are delivered every 4-6 weeks, and the interval extended as the condition improves. On average, 8-9 injections the first year, and 5 in year 2. Improvement in the condition most often improves on medical therapy. Occasional use of focal laser is also recommended for residual macular edema (swelling). Excellent control of blood glucose and blood pressure are encouraged under the care of a primary physician or endocrinologist. Similarly, attempts to maintain serum cholesterol, low density lipoproteins, and high-density lipoproteins in a favorable range were recommended.     Explained the diagnoses, plan, and follow up with the patient and they expressed understanding.  Patient expressed understanding of the importance of proper follow up care.   Brian Herring M.D. Diseases & Surgery of the Retina and Vitreous Retina & Diabetic [place] _1 @     Abbreviations: M myopia (nearsighted); A astigmatism; H hyperopia (farsighted); P presbyopia; Mrx spectacle prescription;  CTL contact lenses; OD right eye; OS left eye; OU both eyes  XT exotropia; ET esotropia; PEK punctate epithelial keratitis; PEE punctate epithelial erosions; DES dry eye syndrome; MGD meibomian gland dysfunction; ATs artificial tears; PFAT's preservative free artificial tears; [place] nuclear sclerotic cataract; PSC posterior subcapsular cataract; ERM epi-retinal membrane; PVD posterior vitreous detachment; RD retinal detachment; DM diabetes mellitus; DR diabetic retinopathy; NPDR non-proliferative diabetic retinopathy; PDR proliferative diabetic retinopathy; CSME clinically significant macular edema; DME diabetic macular edema; dbh dot blot hemorrhages; CWS cotton wool spot; POAG primary open angle glaucoma; C/D cup-to-disc ratio; HVF humphrey visual field; GVF  goldmann visual field; OCT optical coherence tomography; IOP intraocular pressure; BRVO Branch retinal vein occlusion; CRVO  central retinal vein occlusion; CRAO central retinal artery occlusion; BRAO branch retinal artery occlusion; RT retinal tear; SB scleral buckle; PPV pars plana vitrectomy; VH Vitreous hemorrhage; PRP panretinal laser photocoagulation; IVK intravitreal kenalog; VMT vitreomacular traction; MH Macular hole;  NVD neovascularization of the disc; NVE neovascularization elsewhere; AREDS age related eye disease study; ARMD age related macular degeneration; POAG primary open angle glaucoma; EBMD epithelial/anterior basement membrane dystrophy; ACIOL anterior chamber intraocular lens; IOL intraocular lens; PCIOL posterior chamber intraocular lens; Phaco/IOL phacoemulsification with intraocular lens placement; [place] photorefractive keratectomy; LASIK laser assisted in situ keratomileusis; HTN hypertension; DM diabetes mellitus; COPD chronic obstructive pulmonary disease

## 2019-11-04 NOTE — Patient Instructions (Signed)
The nature of diabetic macular edema was discussed with the patient. Treatment options were outlined including medical therapy, laser & vitrectomy. The use of injectable medications reviewed, including Avastin, Lucentis, and Eylea. Periodic injections into the eye are likely to resolve diabetic macular edema (swelling in the center of vision). Initially, injections are delivered are delivered every 4-6 weeks, and the interval extended as the condition improves. On average, 8-9 injections the first year, and 5 in year 2. Improvement in the condition most often improves on medical therapy. Occasional use of focal laser is also recommended for residual macular edema (swelling). Excellent control of blood glucose and blood pressure are encouraged under the care of a primary physician or endocrinologist. Similarly, attempts to maintain serum cholesterol, low density lipoproteins, and high-density lipoproteins in a favorable range were recommended.

## 2019-11-20 ENCOUNTER — Ambulatory Visit (INDEPENDENT_AMBULATORY_CARE_PROVIDER_SITE_OTHER): Payer: PPO | Admitting: Ophthalmology

## 2019-11-20 ENCOUNTER — Telehealth: Payer: Self-pay

## 2019-11-20 ENCOUNTER — Other Ambulatory Visit: Payer: Self-pay

## 2019-11-20 ENCOUNTER — Encounter (INDEPENDENT_AMBULATORY_CARE_PROVIDER_SITE_OTHER): Payer: Self-pay | Admitting: Ophthalmology

## 2019-11-20 DIAGNOSIS — E113412 Type 2 diabetes mellitus with severe nonproliferative diabetic retinopathy with macular edema, left eye: Secondary | ICD-10-CM

## 2019-11-20 NOTE — Assessment & Plan Note (Signed)
And retinal photo coagulation #2 applied today, for retinal nonperfusion

## 2019-11-20 NOTE — Telephone Encounter (Signed)
Patient assistance faxed to [employer]

## 2019-11-20 NOTE — Progress Notes (Signed)
11/20/2019     CHIEF COMPLAINT Patient presents for Retina Follow Up   HISTORY OF PRESENT ILLNESS: Brian Herring is a 64 y.o. male who presents to the clinic today for:   HPI    Retina Follow Up    Patient presents with  Diabetic Retinopathy.  In left eye.  Duration of 3 weeks.  Since onset it is gradually improving.          Comments    3 week follow up -Possible PRP OS Patient states that his vision has improved since last visit. Patient states he is able to use his glasses less. LBS 106 this AM A1C 7.29 Jul 2019       Last edited by Gerda Diss on 11/20/2019 10:49 AM. (History)      Referring physician: [name], MD [name],  [name] [address]  HISTORICAL INFORMATION:   Selected notes from the MEDICAL RECORD NUMBER    Lab Results  Component Value Date   HGBA1C 7.0 (H) 03/12/2018     CURRENT MEDICATIONS: No current outpatient medications on file. (Ophthalmic Drugs)   No current facility-administered medications for this visit. (Ophthalmic Drugs)   Current Outpatient Medications (Other)  Medication Sig  . aspirin EC 81 MG tablet Take 81 mg by mouth daily.  . blood glucose meter kit and supplies KIT Dispense based on patient and insurance preference. Use up to four times daily as directed. (FOR ICD-10: E 11.65).  Marland Kitchen ENTRESTO 97-103 MG TAKE 1 TABLET BY MOUTH TWICE DAILY  . furosemide (LASIX) 40 MG tablet Take 1 tablet (40 mg total) by mouth daily.  Marland Kitchen glimepiride (AMARYL) 4 MG tablet Take 0.5 tablets (2 mg total) by mouth every morning. (Patient taking differently: Take 4 mg by mouth every morning. )  . ivabradine ([name]) 5 MG TABS tablet Take 1 tablet (5 mg total) by mouth 2 (two) times daily with a meal.  . JARDIANCE 25 MG TABS tablet Take 1 tablet by mouth daily.  . Lancets (ONETOUCH DELICA PLUS [id number]) MISC Use to test blood sugar daily  . metoprolol succinate (TOPROL-XL) 25 MG 24 hr tablet TAKE 1 AND 1/2 TABLETS BY MOUTH  DAILY  . mupirocin ointment (BACTROBAN) 2 % Apply to affected area three times daily for 5 days  . ONE TOUCH ULTRA TEST test strip Use to test blood sugar daily  . rosuvastatin (CRESTOR) 10 MG tablet Take 1 tablet (10 mg total) by mouth daily.  Marland Kitchen spironolactone (ALDACTONE) 25 MG tablet Take 1 tablet (25 mg total) by mouth daily.   No current facility-administered medications for this visit. (Other)      REVIEW OF SYSTEMS:    ALLERGIES Allergies  Allergen Reactions  . Sulfur Rash  . Norvasc [Amlodipine Besylate] Rash  . Sulfa Antibiotics Rash    PAST MEDICAL HISTORY Past Medical History:  Diagnosis Date  . Arthritis    shoulder  . Cancer ([place])    skin cancer on nose  . Cardiomyopathy ([place])   . Chronic systolic CHF (congestive heart failure) ([place])   . Diabetes mellitus without complication ([place])   . Hyperlipidemia   . Hypertension    Past Surgical History:  Procedure Laterality Date  . AMPUTATION Right 10/14/2017   Procedure: AMPUTATION  GREAT TOE;  Surgeon: [name], MD;  Location: [facility];  Service: Orthopedics;  Laterality: Right;  . AMPUTATION Right 10/18/2017   Procedure: RIGHT BELOW KNEE AMPUTATION;  Surgeon: [name], MD;  Location: [facility];  Service: Orthopedics;  Laterality: Right;  . CYSTOSCOPY WITH INSERTION OF UROLIFT N/A 03/15/2018   Procedure: CYSTOSCOPY WITH INSERTION OF UROLIFT;  Surgeon: Franchot Gallo, MD;  Location: [facility];  Service: Urology;  Laterality: N/A;  . SHOULDER SURGERY  01/05  . STUMP REVISION Right 12/15/2017   Procedure: REVISION RIGHT BELOW KNEE AMPUTATION;  Surgeon: [name], MD;  Location: [facility];  Service: Orthopedics;  Laterality: Right;    FAMILY HISTORY Family History  Problem Relation Age of Onset  . Pneumonia Mother        aspiration    SOCIAL HISTORY Social History   Tobacco Use  . Smoking status: Never Smoker  . Smokeless tobacco: Never Used  Substance Use Topics  . Alcohol use: No    Alcohol/week:  0.0 standard drinks  . Drug use: Never         OPHTHALMIC EXAM:  Base Eye Exam    Visual Acuity (Snellen - Linear)      Right Left   Dist cc 20/50+2 20/60+2   Dist ph cc NI 20/50-2       Tonometry (Tonopen, 10:55 AM)      Right Left   Pressure 10 11       Dilation    Left eye: 1.0% Mydriacyl, 2.5% Phenylephrine @ 10:56 AM        Slit Lamp and Fundus Exam    External Exam      Right Left   External Normal Normal       Slit Lamp Exam      Right Left   Lids/Lashes Normal Normal   Conjunctiva/Sclera White and quiet White and quiet   Cornea Clear Clear   Anterior Chamber Deep and quiet Deep and quiet   Iris Round and reactive Round and reactive   Vitreous Normal Normal          IMAGING AND PROCEDURES  Imaging and Procedures for 11/20/19  Panretinal Photocoagulation - OS - Left Eye       Anesthesia Topical anesthesia was used. Anesthetic medications included Proparacaine 0.5%.   Laser Information The type of laser was diode. Color was yellow. The duration in seconds was 0.02. The spot size was 390 microns. Laser power was 280. Total spots was 646.   Post-op The patient tolerated the procedure well. There were no complications. The patient received written and verbal post procedure care education.                 ASSESSMENT/PLAN:  Severe nonproliferative diabetic retinopathy of left eye, with macular edema, associated with type 2 diabetes mellitus ([place]) And retinal photo coagulation #2 applied today, for retinal nonperfusion      ICD-10-CM   1. Severe nonproliferative diabetic retinopathy of left eye, with macular edema, associated with type 2 diabetes mellitus (HCC)  N82.9562 Panretinal Photocoagulation - OS - Left Eye    1.  2.  3.  Ophthalmic Meds Ordered this visit:  No orders of the defined types were placed in this encounter.      No follow-ups on file.  There are no Patient Instructions on file for this visit.   Explained  the diagnoses, plan, and follow up with the patient and they expressed understanding.  Patient expressed understanding of the importance of proper follow up care.   Clent Demark Loraine Bhullar M.D. Diseases & Surgery of the Retina and Vitreous Retina & Diabetic [place] 11/20/19     Abbreviations: M myopia (nearsighted); A  astigmatism; H hyperopia (farsighted); P presbyopia; Mrx spectacle prescription;  CTL contact lenses; OD right eye; OS left eye; OU both eyes  XT exotropia; ET esotropia; PEK punctate epithelial keratitis; PEE punctate epithelial erosions; DES dry eye syndrome; MGD meibomian gland dysfunction; ATs artificial tears; PFAT's preservative free artificial tears; [place] nuclear sclerotic cataract; PSC posterior subcapsular cataract; ERM epi-retinal membrane; PVD posterior vitreous detachment; RD retinal detachment; DM diabetes mellitus; DR diabetic retinopathy; NPDR non-proliferative diabetic retinopathy; PDR proliferative diabetic retinopathy; CSME clinically significant macular edema; DME diabetic macular edema; dbh dot blot hemorrhages; CWS cotton wool spot; POAG primary open angle glaucoma; C/D cup-to-disc ratio; HVF humphrey visual field; GVF goldmann visual field; OCT optical coherence tomography; IOP intraocular pressure; BRVO Branch retinal vein occlusion; CRVO central retinal vein occlusion; CRAO central retinal artery occlusion; BRAO branch retinal artery occlusion; RT retinal tear; SB scleral buckle; PPV pars plana vitrectomy; VH Vitreous hemorrhage; PRP panretinal laser photocoagulation; IVK intravitreal kenalog; VMT vitreomacular traction; MH Macular hole;  NVD neovascularization of the disc; NVE neovascularization elsewhere; AREDS age related eye disease study; ARMD age related macular degeneration; POAG primary open angle glaucoma; EBMD epithelial/anterior basement membrane dystrophy; ACIOL anterior chamber intraocular lens; IOL intraocular lens; PCIOL posterior chamber intraocular lens;  Phaco/IOL phacoemulsification with intraocular lens placement; PRK photorefractive keratectomy; LASIK laser assisted in situ keratomileusis; HTN hypertension; DM diabetes mellitus; COPD chronic obstructive pulmonary disease   11/20/2019     CHIEF COMPLAINT Patient presents for Retina Follow Up   HISTORY OF PRESENT ILLNESS: Brian Herring is a 64 y.o. male who presents to the clinic today for:   HPI    Retina Follow Up    Patient presents with  Diabetic Retinopathy.  In left eye.  Duration of 3 weeks.  Since onset it is gradually improving.          Comments    3 week follow up -Possible PRP OS Patient states that his vision has improved since last visit. Patient states he is able to use his glasses less. LBS 106 this AM A1C 7.29 Jul 2019       Last edited by Gerda Diss on 11/20/2019 10:49 AM. (History)      Referring physician: [name], MD [place],  [place] [id number]  HISTORICAL INFORMATION:   Selected notes from the MEDICAL RECORD NUMBER    Lab Results  Component Value Date   HGBA1C 7.0 (H) 03/12/2018     CURRENT MEDICATIONS: No current outpatient medications on file. (Ophthalmic Drugs)   No current facility-administered medications for this visit. (Ophthalmic Drugs)   Current Outpatient Medications (Other)  Medication Sig  . aspirin EC 81 MG tablet Take 81 mg by mouth daily.  . blood glucose meter kit and supplies KIT Dispense based on patient and insurance preference. Use up to four times daily as directed. (FOR ICD-10: E 11.65).  Marland Kitchen ENTRESTO 97-103 MG TAKE 1 TABLET BY MOUTH TWICE DAILY  . furosemide (LASIX) 40 MG tablet Take 1 tablet (40 mg total) by mouth daily.  Marland Kitchen glimepiride (AMARYL) 4 MG tablet Take 0.5 tablets (2 mg total) by mouth every morning. (Patient taking differently: Take 4 mg by mouth every morning. )  . ivabradine ([name]) 5 MG TABS tablet Take 1 tablet (5 mg total) by mouth 2 (two) times daily with a meal.  .  JARDIANCE 25 MG TABS tablet Take 1 tablet by mouth daily.  . Lancets (ONETOUCH DELICA PLUS [id number]) MISC Use to test blood sugar daily  .  metoprolol succinate (TOPROL-XL) 25 MG 24 hr tablet TAKE 1 AND 1/2 TABLETS BY MOUTH DAILY  . mupirocin ointment (BACTROBAN) 2 % Apply to affected area three times daily for 5 days  . ONE TOUCH ULTRA TEST test strip Use to test blood sugar daily  . rosuvastatin (CRESTOR) 10 MG tablet Take 1 tablet (10 mg total) by mouth daily.  Marland Kitchen spironolactone (ALDACTONE) 25 MG tablet Take 1 tablet (25 mg total) by mouth daily.   No current facility-administered medications for this visit. (Other)      REVIEW OF SYSTEMS:    ALLERGIES Allergies  Allergen Reactions  . Sulfur Rash  . Norvasc [Amlodipine Besylate] Rash  . Sulfa Antibiotics Rash    PAST MEDICAL HISTORY Past Medical History:  Diagnosis Date  . Arthritis    shoulder  . Cancer ([facility])    skin cancer on nose  . Cardiomyopathy ([facility])   . Chronic systolic CHF (congestive heart failure) ([place])   . Diabetes mellitus without complication ([place])   . Hyperlipidemia   . Hypertension    Past Surgical History:  Procedure Laterality Date  . AMPUTATION Right 10/14/2017   Procedure: AMPUTATION  GREAT TOE;  Surgeon: [name], MD;  Location: [facility];  Service: Orthopedics;  Laterality: Right;  . AMPUTATION Right 10/18/2017   Procedure: RIGHT BELOW KNEE AMPUTATION;  Surgeon: [name], MD;  Location: [facility];  Service: Orthopedics;  Laterality: Right;  . CYSTOSCOPY WITH INSERTION OF UROLIFT N/A 03/15/2018   Procedure: CYSTOSCOPY WITH INSERTION OF UROLIFT;  Surgeon: Franchot Gallo, MD;  [facility];  Service: Urology;  Laterality: N/A;  . SHOULDER SURGERY  01/05  . STUMP REVISION Right 12/15/2017   Procedure: REVISION RIGHT BELOW KNEE AMPUTATION;  Surgeon: [name], MD;  Location: [facility];  Service: Orthopedics;  Laterality: Right;    FAMILY HISTORY Family History  Problem Relation Age of  Onset  . Pneumonia Mother        aspiration    SOCIAL HISTORY Social History   Tobacco Use  . Smoking status: Never Smoker  . Smokeless tobacco: Never Used  Substance Use Topics  . Alcohol use: No    Alcohol/week: 0.0 standard drinks  . Drug use: Never         OPHTHALMIC EXAM:  Base Eye Exam    Visual Acuity (Snellen - Linear)      Right Left   Dist cc 20/50+2 20/60+2   Dist ph cc NI 20/50-2       Tonometry (Tonopen, 10:55 AM)      Right Left   Pressure 10 11       Dilation    Left eye: 1.0% Mydriacyl, 2.5% Phenylephrine @ 10:56 AM        Slit Lamp and Fundus Exam    External Exam      Right Left   External Normal Normal       Slit Lamp Exam      Right Left   Lids/Lashes Normal Normal   Conjunctiva/Sclera White and quiet White and quiet   Cornea Clear Clear   Anterior Chamber Deep and quiet Deep and quiet   Iris Round and reactive Round and reactive   Vitreous Normal Normal          IMAGING AND PROCEDURES  Imaging and Procedures for 11/20/19  Panretinal Photocoagulation - OS - Left Eye       Anesthesia Topical anesthesia was used. Anesthetic medications included Proparacaine 0.5%.   Laser  Information The type of laser was diode. Color was yellow. The duration in seconds was 0.02. The spot size was 390 microns. Laser power was 280. Total spots was 646.   Post-op The patient tolerated the procedure well. There were no complications. The patient received written and verbal post procedure care education.                 ASSESSMENT/PLAN:  Severe nonproliferative diabetic retinopathy of left eye, with macular edema, associated with type 2 diabetes mellitus ([place]) And retinal photo coagulation #2 applied today, for retinal nonperfusion      ICD-10-CM   1. Severe nonproliferative diabetic retinopathy of left eye, with macular edema, associated with type 2 diabetes mellitus (HCC)  V42.5956 Panretinal Photocoagulation - OS - Left Eye     1.  2.  3.  Ophthalmic Meds Ordered this visit:  No orders of the defined types were placed in this encounter.      No follow-ups on file.  There are no Patient Instructions on file for this visit.   Explained the diagnoses, plan, and follow up with the patient and they expressed understanding.  Patient expressed understanding of the importance of proper follow up care.   Clent Demark Francisco Ostrovsky M.D. Diseases & Surgery of the Retina and Vitreous Retina & Diabetic [place] 11/20/19     Abbreviations: M myopia (nearsighted); A astigmatism; H hyperopia (farsighted); P presbyopia; Mrx spectacle prescription;  CTL contact lenses; OD right eye; OS left eye; OU both eyes  XT exotropia; ET esotropia; PEK punctate epithelial keratitis; PEE punctate epithelial erosions; DES dry eye syndrome; MGD meibomian gland dysfunction; ATs artificial tears; PFAT's preservative free artificial tears; [place] nuclear sclerotic cataract; PSC posterior subcapsular cataract; ERM epi-retinal membrane; PVD posterior vitreous detachment; RD retinal detachment; DM diabetes mellitus; DR diabetic retinopathy; NPDR non-proliferative diabetic retinopathy; PDR proliferative diabetic retinopathy; CSME clinically significant macular edema; DME diabetic macular edema; dbh dot blot hemorrhages; CWS cotton wool spot; POAG primary open angle glaucoma; C/D cup-to-disc ratio; HVF humphrey visual field; GVF goldmann visual field; OCT optical coherence tomography; IOP intraocular pressure; BRVO Branch retinal vein occlusion; CRVO central retinal vein occlusion; CRAO central retinal artery occlusion; BRAO branch retinal artery occlusion; RT retinal tear; SB scleral buckle; PPV pars plana vitrectomy; VH Vitreous hemorrhage; PRP panretinal laser photocoagulation; IVK intravitreal kenalog; VMT vitreomacular traction; MH Macular hole;  NVD neovascularization of the disc; NVE neovascularization elsewhere; AREDS age related eye disease study;  ARMD age related macular degeneration; POAG primary open angle glaucoma; EBMD epithelial/anterior basement membrane dystrophy; ACIOL anterior chamber intraocular lens; IOL intraocular lens; PCIOL posterior chamber intraocular lens; Phaco/IOL phacoemulsification with intraocular lens placement; [place] photorefractive keratectomy; LASIK laser assisted in situ keratomileusis; HTN hypertension; DM diabetes mellitus; COPD chronic obstructive pulmonary disease

## 2019-11-28 DIAGNOSIS — I10 Essential (primary) hypertension: Secondary | ICD-10-CM | POA: Diagnosis not present

## 2019-11-28 DIAGNOSIS — E7800 Pure hypercholesterolemia, unspecified: Secondary | ICD-10-CM | POA: Diagnosis not present

## 2019-11-28 DIAGNOSIS — E1169 Type 2 diabetes mellitus with other specified complication: Secondary | ICD-10-CM | POA: Diagnosis not present

## 2019-12-04 ENCOUNTER — Other Ambulatory Visit: Payer: Self-pay

## 2019-12-04 ENCOUNTER — Encounter (INDEPENDENT_AMBULATORY_CARE_PROVIDER_SITE_OTHER): Payer: Self-pay | Admitting: Ophthalmology

## 2019-12-04 ENCOUNTER — Ambulatory Visit (INDEPENDENT_AMBULATORY_CARE_PROVIDER_SITE_OTHER): Payer: PPO | Admitting: Ophthalmology

## 2019-12-04 DIAGNOSIS — E113412 Type 2 diabetes mellitus with severe nonproliferative diabetic retinopathy with macular edema, left eye: Secondary | ICD-10-CM | POA: Diagnosis not present

## 2019-12-04 DIAGNOSIS — E113411 Type 2 diabetes mellitus with severe nonproliferative diabetic retinopathy with macular edema, right eye: Secondary | ICD-10-CM

## 2019-12-04 MED ORDER — AFLIBERCEPT 2MG/0.05ML IZ SOLN FOR KALEIDOSCOPE
2.0000 mg | INTRAVITREAL | Status: AC | PRN
  Administered 2019-12-04: 2 mg via INTRAVITREAL

## 2019-12-04 NOTE — Progress Notes (Signed)
12/04/2019     CHIEF COMPLAINT Patient presents for Retina Follow Up   HISTORY OF PRESENT ILLNESS: Brian Herring is a 64 y.o. male who presents to the clinic today for:   HPI    Retina Follow Up    Patient presents with  Diabetic Retinopathy.  In left eye.  This started 5 weeks ago.  Severity is mild.  Duration of 5 weeks.  Since onset it is stable.          Comments    5 Week F/U OS, poss Eylea OS  Pt reports gradually improving VA OU. Pt sts VA improvement is occurring very slowly OU. Pt sts he is able to read street signs now. Pt denies ocular pain, flashes, or floaters OU. LBS: 101 this AM       Last edited by Rockie Neighbours, [place] on 12/04/2019 10:45 AM. (History)      Referring physician: [name], MD [place],  [place] [address]  HISTORICAL INFORMATION:   Selected notes from the MEDICAL RECORD NUMBER    Lab Results  Component Value Date   HGBA1C 7.0 (H) 03/12/2018     CURRENT MEDICATIONS: No current outpatient medications on file. (Ophthalmic Drugs)   No current facility-administered medications for this visit. (Ophthalmic Drugs)   Current Outpatient Medications (Other)  Medication Sig  . aspirin EC 81 MG tablet Take 81 mg by mouth daily.  . blood glucose meter kit and supplies KIT Dispense based on patient and insurance preference. Use up to four times daily as directed. (FOR ICD-10: E 11.65).  Marland Kitchen ENTRESTO 97-103 MG TAKE 1 TABLET BY MOUTH TWICE DAILY  . furosemide (LASIX) 40 MG tablet Take 1 tablet (40 mg total) by mouth daily.  Marland Kitchen glimepiride (AMARYL) 4 MG tablet Take 0.5 tablets (2 mg total) by mouth every morning. (Patient taking differently: Take 4 mg by mouth every morning. )  . ivabradine ([name]) 5 MG TABS tablet Take 1 tablet (5 mg total) by mouth 2 (two) times daily with a meal.  . JARDIANCE 25 MG TABS tablet Take 1 tablet by mouth daily.  . Lancets (ONETOUCH DELICA PLUS [id number]) MISC Use to test blood sugar daily  .  metoprolol succinate (TOPROL-XL) 25 MG 24 hr tablet TAKE 1 AND 1/2 TABLETS BY MOUTH DAILY  . mupirocin ointment (BACTROBAN) 2 % Apply to affected area three times daily for 5 days  . ONE TOUCH ULTRA TEST test strip Use to test blood sugar daily  . rosuvastatin (CRESTOR) 10 MG tablet Take 1 tablet (10 mg total) by mouth daily.  Marland Kitchen spironolactone (ALDACTONE) 25 MG tablet Take 1 tablet (25 mg total) by mouth daily.   No current facility-administered medications for this visit. (Other)      REVIEW OF SYSTEMS:    ALLERGIES Allergies  Allergen Reactions  . Sulfur Rash  . Norvasc [Amlodipine Besylate] Rash  . Sulfa Antibiotics Rash    PAST MEDICAL HISTORY Past Medical History:  Diagnosis Date  . Arthritis    shoulder  . Cancer ([place])    skin cancer on nose  . Cardiomyopathy ([place])   . Chronic systolic CHF (congestive heart failure) ([place])   . Diabetes mellitus without complication ([place])   . Hyperlipidemia   . Hypertension    Past Surgical History:  Procedure Laterality Date  . AMPUTATION Right 10/14/2017   Procedure: AMPUTATION  GREAT TOE;  Surgeon: [name], MD;  Location: [facility];  Service: Orthopedics;  Laterality: Right;  . AMPUTATION Right 10/18/2017   Procedure: RIGHT BELOW KNEE AMPUTATION;  Surgeon: [name], MD;  Location: [facility];  Service: Orthopedics;  Laterality: Right;  . CYSTOSCOPY WITH INSERTION OF UROLIFT N/A 03/15/2018   Procedure: CYSTOSCOPY WITH INSERTION OF UROLIFT;  Surgeon: Franchot Gallo, MD;  Location: [facility];  Service: Urology;  Laterality: N/A;  . SHOULDER SURGERY  01/05  . STUMP REVISION Right 12/15/2017   Procedure: REVISION RIGHT BELOW KNEE AMPUTATION;  Surgeon: [name], MD;  Location: [facility];  Service: Orthopedics;  Laterality: Right;    FAMILY HISTORY Family History  Problem Relation Age of Onset  . Pneumonia Mother        aspiration    SOCIAL HISTORY Social History   Tobacco Use  . Smoking status: Never Smoker  .  Smokeless tobacco: Never Used  Substance Use Topics  . Alcohol use: No    Alcohol/week: 0.0 standard drinks  . Drug use: Never         OPHTHALMIC EXAM:  Base Eye Exam    Visual Acuity (ETDRS)      Right Left   Dist cc 20/40ecc +1 20/70ecc +2   Dist ph cc 20/40ecc +2 20/50 +2   Correction: Glasses       Tonometry (Tonopen, 10:53 AM)      Right Left   Pressure 12 13       Pupils      Pupils Dark Light Shape React APD   Right PERRL 4 3 Round Brisk None   Left PERRL 4 3 Round Brisk None       Visual Fields (Counting fingers)      Left Right    Full Full       Extraocular Movement      Right Left    Full Full       Neuro/Psych    Oriented x3: Yes   Mood/Affect: Normal       Dilation    Left eye: 1.0% Mydriacyl, 2.5% Phenylephrine @ 10:53 AM        Slit Lamp and Fundus Exam    External Exam      Right Left   External Normal Normal       Slit Lamp Exam      Right Left   Lids/Lashes Normal Normal   Conjunctiva/Sclera White and quiet White and quiet   Cornea Clear Clear   Anterior Chamber Deep and quiet Deep and quiet   Iris Round and reactive Round and reactive   Lens 2+ Nuclear sclerosis 2+ Nuclear sclerosis   Anterior Vitreous Normal Normal       Fundus Exam      Right Left   Posterior Vitreous  Normal   Disc  Normal   C/D Ratio  0.3   Macula  Clinically significant macular edema   Vessels  Severe NPDR   Periphery  Mild scatter pattern laser          IMAGING AND PROCEDURES  Imaging and Procedures for 12/04/19  OCT, Retina - OU - Both Eyes       Right Eye Quality was good. Scan locations included subfoveal. Central Foveal Thickness: 386. Progression has improved.   Left Eye Central Foveal Thickness: 403. Progression has improved.   Notes Retinal thickening OU, CSME OU overall improved from baseline yet still active disease.       Intravitreal Injection, Pharmacologic Agent - OS - Left Eye  Time Out 12/04/2019. 11:12  AM. Confirmed correct patient, procedure, site, and patient consented.   Anesthesia Topical anesthesia was used. Anesthetic medications included Akten 3.5%.   Procedure Preparation included Tobramycin 0.3%, 10% betadine to eyelids.   Injection:  2 mg aflibercept Alfonse Flavors) SOLN   NDC: [id number], Lot: [id number]   Route: Intravitreal, Site: Left Eye, Waste: 0 mg  Post-op Post injection exam found visual acuity of at least counting fingers. The patient tolerated the procedure well. There were no complications. The patient received written and verbal post procedure care education. Post injection medications were not given.                 ASSESSMENT/PLAN:  Severe nonproliferative diabetic retinopathy of left eye, with macular edema, associated with type 2 diabetes mellitus (HCC) OS, severe NPDR, status post PRP session #2 OS.  CSME continues to slowly improve on intravitreal Eylea.  Currently at 5-week interval.  Repeat intravitreal injection today      ICD-10-CM   1. Severe nonproliferative diabetic retinopathy of left eye, with macular edema, associated with type 2 diabetes mellitus (HCC)  C94.4967 OCT, Retina - OU - Both Eyes    Intravitreal Injection, Pharmacologic Agent - OS - Left Eye    aflibercept (EYLEA) SOLN 2 mg  2. Severe nonproliferative diabetic retinopathy of right eye, with macular edema, associated with type 2 diabetes mellitus ([place])  E11.3411     1.OS, severe NPDR, status post PRP session #2 OS.  CSME continues to slowly improve on intravitreal Eylea.  Currently at 5-week interval.  Repeat intravitreal injection today  2.  3.  Ophthalmic Meds Ordered this visit:  Meds ordered this encounter  Medications  . aflibercept (EYLEA) SOLN 2 mg       Return in about 5 weeks (around 01/08/2020) for dilate, OS, EYLEA OCT, COLOR FP.  There are no Patient Instructions on file for this visit.   Explained the diagnoses, plan, and follow up with the patient and  they expressed understanding.  Patient expressed understanding of the importance of proper follow up care.   Clent Demark Alphons Burgert M.D. Diseases & Surgery of the Retina and Vitreous Retina & Diabetic [place] 12/04/19     Abbreviations: M myopia (nearsighted); A astigmatism; H hyperopia (farsighted); P presbyopia; Mrx spectacle prescription;  CTL contact lenses; OD right eye; OS left eye; OU both eyes  XT exotropia; ET esotropia; PEK punctate epithelial keratitis; PEE punctate epithelial erosions; DES dry eye syndrome; MGD meibomian gland dysfunction; ATs artificial tears; PFAT's preservative free artificial tears; [place] nuclear sclerotic cataract; PSC posterior subcapsular cataract; ERM epi-retinal membrane; PVD posterior vitreous detachment; RD retinal detachment; DM diabetes mellitus; DR diabetic retinopathy; NPDR non-proliferative diabetic retinopathy; PDR proliferative diabetic retinopathy; CSME clinically significant macular edema; DME diabetic macular edema; dbh dot blot hemorrhages; CWS cotton wool spot; POAG primary open angle glaucoma; C/D cup-to-disc ratio; HVF humphrey visual field; GVF goldmann visual field; OCT optical coherence tomography; IOP intraocular pressure; BRVO Branch retinal vein occlusion; CRVO central retinal vein occlusion; CRAO central retinal artery occlusion; BRAO branch retinal artery occlusion; RT retinal tear; SB scleral buckle; PPV pars plana vitrectomy; VH Vitreous hemorrhage; PRP panretinal laser photocoagulation; IVK intravitreal kenalog; VMT vitreomacular traction; MH Macular hole;  NVD neovascularization of the disc; NVE neovascularization elsewhere; AREDS age related eye disease study; ARMD age related macular degeneration; POAG primary open angle glaucoma; EBMD epithelial/anterior basement membrane dystrophy; ACIOL anterior chamber intraocular lens; IOL intraocular lens; PCIOL posterior chamber intraocular lens; Phaco/IOL phacoemulsification with  intraocular lens  placement; [place] photorefractive keratectomy; LASIK laser assisted in situ keratomileusis; HTN hypertension; DM diabetes mellitus; COPD chronic obstructive pulmonary disease

## 2019-12-04 NOTE — Assessment & Plan Note (Signed)
OS, severe NPDR, status post PRP session #2 OS.  CSME continues to slowly improve on intravitreal Eylea.  Currently at 5-week interval.  Repeat intravitreal injection today

## 2019-12-12 ENCOUNTER — Other Ambulatory Visit: Payer: Self-pay

## 2019-12-12 ENCOUNTER — Encounter (INDEPENDENT_AMBULATORY_CARE_PROVIDER_SITE_OTHER): Payer: Self-pay | Admitting: Ophthalmology

## 2019-12-12 ENCOUNTER — Ambulatory Visit (INDEPENDENT_AMBULATORY_CARE_PROVIDER_SITE_OTHER): Payer: PPO | Admitting: Ophthalmology

## 2019-12-12 DIAGNOSIS — E113411 Type 2 diabetes mellitus with severe nonproliferative diabetic retinopathy with macular edema, right eye: Secondary | ICD-10-CM | POA: Diagnosis not present

## 2019-12-12 DIAGNOSIS — E113412 Type 2 diabetes mellitus with severe nonproliferative diabetic retinopathy with macular edema, left eye: Secondary | ICD-10-CM

## 2019-12-12 MED ORDER — AFLIBERCEPT 2MG/0.05ML IZ SOLN FOR KALEIDOSCOPE
2.0000 mg | INTRAVITREAL | Status: AC | PRN
  Administered 2019-12-12: 2 mg via INTRAVITREAL

## 2019-12-12 NOTE — Progress Notes (Signed)
12/12/2019     CHIEF COMPLAINT Patient presents for Retina Follow Up   HISTORY OF PRESENT ILLNESS: Brian Herring is a 64 y.o. male who presents to the clinic today for:   HPI    Retina Follow Up    Patient presents with  Diabetic Retinopathy.  In both eyes.  Duration of 5 weeks.  Since onset it is stable.          Comments    5 week follow up - OCT OU, Possible Eylea OD Patient denies change in vision and overall has no complaints. LBS 114 this AM A1C 7.27 Jun 2020        Last edited by Gerda Diss on 12/12/2019  1:03 PM. (History)      Referring physician: [name], MD [facility],  [place] [address]  HISTORICAL INFORMATION:   Selected notes from the MEDICAL RECORD NUMBER    Lab Results  Component Value Date   HGBA1C 7.0 (H) 03/12/2018     CURRENT MEDICATIONS: No current outpatient medications on file. (Ophthalmic Drugs)   No current facility-administered medications for this visit. (Ophthalmic Drugs)   Current Outpatient Medications (Other)  Medication Sig  . aspirin EC 81 MG tablet Take 81 mg by mouth daily.  . blood glucose meter kit and supplies KIT Dispense based on patient and insurance preference. Use up to four times daily as directed. (FOR ICD-10: E 11.65).  Marland Kitchen ENTRESTO 97-103 MG TAKE 1 TABLET BY MOUTH TWICE DAILY  . furosemide (LASIX) 40 MG tablet Take 1 tablet (40 mg total) by mouth daily.  Marland Kitchen glimepiride (AMARYL) 4 MG tablet Take 0.5 tablets (2 mg total) by mouth every morning. (Patient taking differently: Take 4 mg by mouth every morning. )  . ivabradine ([name]) 5 MG TABS tablet Take 1 tablet (5 mg total) by mouth 2 (two) times daily with a meal.  . JARDIANCE 25 MG TABS tablet Take 1 tablet by mouth daily.  . Lancets (ONETOUCH DELICA PLUS [id number]) MISC Use to test blood sugar daily  . metoprolol succinate (TOPROL-XL) 25 MG 24 hr tablet TAKE 1 AND 1/2 TABLETS BY MOUTH DAILY  . mupirocin ointment (BACTROBAN) 2 % Apply  to affected area three times daily for 5 days  . ONE TOUCH ULTRA TEST test strip Use to test blood sugar daily  . rosuvastatin (CRESTOR) 10 MG tablet Take 1 tablet (10 mg total) by mouth daily.  Marland Kitchen spironolactone (ALDACTONE) 25 MG tablet Take 1 tablet (25 mg total) by mouth daily.   No current facility-administered medications for this visit. (Other)      REVIEW OF SYSTEMS:    ALLERGIES Allergies  Allergen Reactions  . Sulfur Rash  . Norvasc [Amlodipine Besylate] Rash  . Sulfa Antibiotics Rash    PAST MEDICAL HISTORY Past Medical History:  Diagnosis Date  . Arthritis    shoulder  . Cancer ([place])    skin cancer on nose  . Cardiomyopathy ([place])   . Chronic systolic CHF (congestive heart failure) ([place])   . Diabetes mellitus without complication ([place])   . Hyperlipidemia   . Hypertension    Past Surgical History:  Procedure Laterality Date  . AMPUTATION Right 10/14/2017   Procedure: AMPUTATION  GREAT TOE;  Surgeon: [name], MD;  Location: [facility];  Service: Orthopedics;  Laterality: Right;  . AMPUTATION Right 10/18/2017   Procedure: RIGHT BELOW KNEE AMPUTATION;  Surgeon: [name], MD;  Location: [facility];  Service:  Orthopedics;  Laterality: Right;  . CYSTOSCOPY WITH INSERTION OF UROLIFT N/A 03/15/2018   Procedure: CYSTOSCOPY WITH INSERTION OF UROLIFT;  Surgeon: Franchot Gallo, MD;  [facility];  Service: Urology;  Laterality: N/A;  . SHOULDER SURGERY  01/05  . STUMP REVISION Right 12/15/2017   Procedure: REVISION RIGHT BELOW KNEE AMPUTATION;  Surgeon: [name], MD;  Location: [facility];  Service: Orthopedics;  Laterality: Right;    FAMILY HISTORY Family History  Problem Relation Age of Onset  . Pneumonia Mother        aspiration    SOCIAL HISTORY Social History   Tobacco Use  . Smoking status: Never Smoker  . Smokeless tobacco: Never Used  Substance Use Topics  . Alcohol use: No    Alcohol/week: 0.0 standard drinks  . Drug use: Never          OPHTHALMIC EXAM:  Base Eye Exam    Visual Acuity (Snellen - Linear)      Right Left   Dist cc 20/30+2 20/60+2   Dist ph cc 20/25-2 20/50+2   Correction: Glasses       Tonometry (Tonopen, 1:10 PM)      Right Left   Pressure 10 9       Pupils      Pupils Dark Light Shape React APD   Right PERRL 4 3 Round Slow None   Left PERRL 4 3 Round Slow None       Visual Fields (Counting fingers)      Left Right    Full Full       Extraocular Movement      Right Left    Full Full       Neuro/Psych    Oriented x3: Yes   Mood/Affect: Normal       Dilation    Both eyes: 1.0% Mydriacyl, 2.5% Phenylephrine @ 1:10 PM        Slit Lamp and Fundus Exam    External Exam      Right Left   External Normal Normal       Slit Lamp Exam      Right Left   Lids/Lashes Normal Normal   Conjunctiva/Sclera White and quiet White and quiet   Cornea Clear Clear   Anterior Chamber Deep and quiet Deep and quiet   Iris Round and reactive Round and reactive   Vitreous Normal Normal          IMAGING AND PROCEDURES  Imaging and Procedures for 12/12/19  Fluorescein Angiography Optos (Transit OD)       Right Eye   Progression has improved. Mid/Late phase findings include leakage, microaneurysm, vascular perfusion defect. Choroidal neovascularization is not present.   Left Eye   Progression has improved. Early phase findings include leakage. Mid/Late phase findings include leakage, microaneurysm, vascular perfusion defect. Choroidal neovascularization is not present.   Notes Clinically significant MAC edema persist and is strikingly of few microaneurysms in the parafoveal region each eye however there is a concentration temporally which does raise the specter of possible MAC-TEL superimposed here.  We will continue with ongoing therapy to try to controlled clinically significant macular edema OU while delivering peripheral PRP to regions of retinal nonperfusion       OCT,  Retina - OU - Both Eyes       Right Eye Quality was good. Scan locations included subfoveal. Central Foveal Thickness: 369. Progression has improved. Findings include abnormal foveal contour.   Left Eye Quality was good.  Scan locations included subfoveal. Central Foveal Thickness: 392. Findings include abnormal foveal contour.   Notes Bilateral CSME, improved though chronically active       Color Fundus Photography Optos - OU - Both Eyes       Right Eye Progression has been stable. Disc findings include normal observations. Macula : microaneurysms, edema.   Left Eye Progression has been stable. Disc findings include normal observations. Macula : microaneurysms, edema.   Notes Very NPDR OU.  CSME without exudate persist and has improved on intravitreal Eylea yet angiographically there still is a relative paucity of late clinically significant macular edema.  More over there is not much in the way of peripheral retinal nonperfusion.       Intravitreal Injection, Pharmacologic Agent - OD - Right Eye       Time Out 12/12/2019. 2:11 PM. Confirmed correct patient, procedure, site, and patient consented.   Anesthesia Topical anesthesia was used. Anesthetic medications included Akten 3.5%.   Procedure Preparation included Ofloxacin , 10% betadine to eyelids, 5% betadine to ocular surface. A 30 gauge needle was used.   Injection:  2 mg aflibercept Alfonse Flavors) SOLN   NDC: [id number], Lot: [id number]   Route: Intravitreal, Site: Right Eye, Waste: 0 mg  Post-op Post injection exam found visual acuity of at least counting fingers. The patient tolerated the procedure well. There were no complications. The patient received written and verbal post procedure care education. Post injection medications were not given.                 ASSESSMENT/PLAN:  No problem-specific Assessment & Plan notes found for this encounter.      ICD-10-CM   1. Severe nonproliferative diabetic  retinopathy of right eye, with macular edema, associated with type 2 diabetes mellitus (HCC)  E11.3411 OCT, Retina - OU - Both Eyes    Intravitreal Injection, Pharmacologic Agent - OD - Right Eye    aflibercept (EYLEA) SOLN 2 mg  2. Severe nonproliferative diabetic retinopathy of left eye, with macular edema, associated with type 2 diabetes mellitus (HCC)  N19.1660 Fluorescein Angiography Optos (Transit OD)    OCT, Retina - OU - Both Eyes    Color Fundus Photography Optos - OU - Both Eyes    1.Very NPDR OU.  CSME without exudate persist and has improved on intravitreal Eylea yet angiographically there still is a relative paucity of late clinically significant macular edema.  More over there is not much in the way of peripheral retinal nonperfusion.  2.  There is good PRP nasally in each eye.  For this reason we will simply observe.  There are no large areas of capillary dropout in the retinal periphery therefore we will not deliver more PRP in either eye today.  3.  You have managed the macular edema component as we are with intravitreal Eylea periodically.  Review of systems  is borderline for sleep apnea with only snoring but not does not awaken once nightly near early morning.  Only occasionally naps.  Follow-up as scheduled for intravitreal Eylea OS OD  Ophthalmic Meds Ordered this visit:  Meds ordered this encounter  Medications  . aflibercept (EYLEA) SOLN 2 mg       No follow-ups on file.  There are no Patient Instructions on file for this visit.   Explained the diagnoses, plan, and follow up with the patient and they expressed understanding.  Patient expressed understanding of the importance of proper follow up care.   Clent Demark Rankin M.D.  Diseases & Surgery of the Retina and Vitreous Retina & Diabetic [place] 12/12/19     Abbreviations: M myopia (nearsighted); A astigmatism; H hyperopia (farsighted); P presbyopia; Mrx spectacle prescription;  CTL contact lenses; OD  right eye; OS left eye; OU both eyes  XT exotropia; ET esotropia; PEK punctate epithelial keratitis; PEE punctate epithelial erosions; DES dry eye syndrome; MGD meibomian gland dysfunction; ATs artificial tears; PFAT's preservative free artificial tears; [place] nuclear sclerotic cataract; PSC posterior subcapsular cataract; ERM epi-retinal membrane; PVD posterior vitreous detachment; RD retinal detachment; DM diabetes mellitus; DR diabetic retinopathy; NPDR non-proliferative diabetic retinopathy; PDR proliferative diabetic retinopathy; CSME clinically significant macular edema; DME diabetic macular edema; dbh dot blot hemorrhages; CWS cotton wool spot; POAG primary open angle glaucoma; C/D cup-to-disc ratio; HVF humphrey visual field; GVF goldmann visual field; OCT optical coherence tomography; IOP intraocular pressure; BRVO Branch retinal vein occlusion; CRVO central retinal vein occlusion; CRAO central retinal artery occlusion; BRAO branch retinal artery occlusion; RT retinal tear; SB scleral buckle; PPV pars plana vitrectomy; VH Vitreous hemorrhage; PRP panretinal laser photocoagulation; IVK intravitreal kenalog; VMT vitreomacular traction; MH Macular hole;  NVD neovascularization of the disc; NVE neovascularization elsewhere; AREDS age related eye disease study; ARMD age related macular degeneration; POAG primary open angle glaucoma; EBMD epithelial/anterior basement membrane dystrophy; ACIOL anterior chamber intraocular lens; IOL intraocular lens; PCIOL posterior chamber intraocular lens; Phaco/IOL phacoemulsification with intraocular lens placement; [place] photorefractive keratectomy; LASIK laser assisted in situ keratomileusis; HTN hypertension; DM diabetes mellitus; COPD chronic obstructive pulmonary disease

## 2019-12-24 DIAGNOSIS — B351 Tinea unguium: Secondary | ICD-10-CM | POA: Diagnosis not present

## 2019-12-24 DIAGNOSIS — E7800 Pure hypercholesterolemia, unspecified: Secondary | ICD-10-CM | POA: Diagnosis not present

## 2019-12-24 DIAGNOSIS — Z0000 Encounter for general adult medical examination without abnormal findings: Secondary | ICD-10-CM | POA: Diagnosis not present

## 2019-12-24 DIAGNOSIS — I10 Essential (primary) hypertension: Secondary | ICD-10-CM | POA: Diagnosis not present

## 2019-12-24 DIAGNOSIS — D729 Disorder of white blood cells, unspecified: Secondary | ICD-10-CM | POA: Diagnosis not present

## 2019-12-24 DIAGNOSIS — G546 Phantom limb syndrome with pain: Secondary | ICD-10-CM | POA: Diagnosis not present

## 2019-12-24 DIAGNOSIS — E1169 Type 2 diabetes mellitus with other specified complication: Secondary | ICD-10-CM | POA: Diagnosis not present

## 2019-12-24 DIAGNOSIS — I420 Dilated cardiomyopathy: Secondary | ICD-10-CM | POA: Diagnosis not present

## 2019-12-24 DIAGNOSIS — Z89511 Acquired absence of right leg below knee: Secondary | ICD-10-CM | POA: Diagnosis not present

## 2019-12-24 DIAGNOSIS — Z125 Encounter for screening for malignant neoplasm of prostate: Secondary | ICD-10-CM | POA: Diagnosis not present

## 2020-01-14 DIAGNOSIS — E1169 Type 2 diabetes mellitus with other specified complication: Secondary | ICD-10-CM | POA: Diagnosis not present

## 2020-01-15 ENCOUNTER — Ambulatory Visit (INDEPENDENT_AMBULATORY_CARE_PROVIDER_SITE_OTHER): Payer: PPO | Admitting: Ophthalmology

## 2020-01-15 ENCOUNTER — Other Ambulatory Visit: Payer: Self-pay

## 2020-01-15 ENCOUNTER — Encounter (INDEPENDENT_AMBULATORY_CARE_PROVIDER_SITE_OTHER): Payer: Self-pay | Admitting: Ophthalmology

## 2020-01-15 DIAGNOSIS — E113411 Type 2 diabetes mellitus with severe nonproliferative diabetic retinopathy with macular edema, right eye: Secondary | ICD-10-CM

## 2020-01-15 DIAGNOSIS — E113412 Type 2 diabetes mellitus with severe nonproliferative diabetic retinopathy with macular edema, left eye: Secondary | ICD-10-CM | POA: Diagnosis not present

## 2020-01-15 DIAGNOSIS — E7800 Pure hypercholesterolemia, unspecified: Secondary | ICD-10-CM | POA: Diagnosis not present

## 2020-01-15 DIAGNOSIS — E1169 Type 2 diabetes mellitus with other specified complication: Secondary | ICD-10-CM | POA: Diagnosis not present

## 2020-01-15 DIAGNOSIS — I10 Essential (primary) hypertension: Secondary | ICD-10-CM | POA: Diagnosis not present

## 2020-01-15 MED ORDER — AFLIBERCEPT 2MG/0.05ML IZ SOLN FOR KALEIDOSCOPE
2.0000 mg | INTRAVITREAL | Status: AC | PRN
  Administered 2020-01-15: 2 mg via INTRAVITREAL

## 2020-01-15 NOTE — Progress Notes (Signed)
01/15/2020     CHIEF COMPLAINT Patient presents for Retina Follow Up   HISTORY OF PRESENT ILLNESS: Brian Herring is a 64 y.o. male who presents to the clinic today for:   HPI    Retina Follow Up    Patient presents with  Diabetic Retinopathy.  In left eye.  This started 6 weeks ago.  Severity is mild.  Duration of 6 weeks.  Since onset it is stable.          Comments    6 Week Diabetic F/U OS, poss Eylea OS  Pt denies noticeable changes to [place] OU since last visit. Pt denies ocular pain, flashes of light, or floaters OU.         Last edited by Rockie Neighbours, [place] on 01/15/2020 10:03 AM. (History)      Referring physician: [name], MD [place],  [place] [address]  HISTORICAL INFORMATION:   Selected notes from the MEDICAL RECORD NUMBER    Lab Results  Component Value Date   HGBA1C 7.0 (H) 03/12/2018     CURRENT MEDICATIONS: No current outpatient medications on file. (Ophthalmic Drugs)   No current facility-administered medications for this visit. (Ophthalmic Drugs)   Current Outpatient Medications (Other)  Medication Sig  . aspirin EC 81 MG tablet Take 81 mg by mouth daily.  . blood glucose meter kit and supplies KIT Dispense based on patient and insurance preference. Use up to four times daily as directed. (FOR ICD-10: E 11.65).  Marland Kitchen ENTRESTO 97-103 MG TAKE 1 TABLET BY MOUTH TWICE DAILY  . furosemide (LASIX) 40 MG tablet Take 1 tablet (40 mg total) by mouth daily.  Marland Kitchen glimepiride (AMARYL) 4 MG tablet Take 0.5 tablets (2 mg total) by mouth every morning. (Patient taking differently: Take 4 mg by mouth every morning. )  . ivabradine ([name]) 5 MG TABS tablet Take 1 tablet (5 mg total) by mouth 2 (two) times daily with a meal.  . JARDIANCE 25 MG TABS tablet Take 1 tablet by mouth daily.  . Lancets (ONETOUCH DELICA PLUS [id number]) MISC Use to test blood sugar daily  . metoprolol succinate (TOPROL-XL) 25 MG 24 hr tablet TAKE 1 AND 1/2  TABLETS BY MOUTH DAILY  . mupirocin ointment (BACTROBAN) 2 % Apply to affected area three times daily for 5 days  . ONE TOUCH ULTRA TEST test strip Use to test blood sugar daily  . rosuvastatin (CRESTOR) 10 MG tablet Take 1 tablet (10 mg total) by mouth daily.  Marland Kitchen spironolactone (ALDACTONE) 25 MG tablet Take 1 tablet (25 mg total) by mouth daily.   No current facility-administered medications for this visit. (Other)      REVIEW OF SYSTEMS:    ALLERGIES Allergies  Allergen Reactions  . Sulfur Rash  . Norvasc [Amlodipine Besylate] Rash  . Sulfa Antibiotics Rash    PAST MEDICAL HISTORY Past Medical History:  Diagnosis Date  . Arthritis    shoulder  . Cancer ([facility])    skin cancer on nose  . Cardiomyopathy ([place])   . Chronic systolic CHF (congestive heart failure) ([place])   . Diabetes mellitus without complication ([facility] [place])   . Hyperlipidemia   . Hypertension    Past Surgical History:  Procedure Laterality Date  . AMPUTATION Right 10/14/2017   Procedure: AMPUTATION  GREAT TOE;  Surgeon: [name], MD;  Location: [facility];  Service: Orthopedics;  Laterality: Right;  . AMPUTATION Right 10/18/2017   Procedure: RIGHT BELOW KNEE AMPUTATION;  Surgeon: [name], MD;  Location: [facility];  Service: Orthopedics;  Laterality: Right;  . CYSTOSCOPY WITH INSERTION OF UROLIFT N/A 03/15/2018   Procedure: CYSTOSCOPY WITH INSERTION OF UROLIFT;  Surgeon: Franchot Gallo, MD;  Location: [facility];  Service: Urology;  Laterality: N/A;  . SHOULDER SURGERY  01/05  . STUMP REVISION Right 12/15/2017   Procedure: REVISION RIGHT BELOW KNEE AMPUTATION;  Surgeon: [name], MD;  Location: [facility];  Service: Orthopedics;  Laterality: Right;    FAMILY HISTORY Family History  Problem Relation Age of Onset  . Pneumonia Mother        aspiration    SOCIAL HISTORY Social History   Tobacco Use  . Smoking status: Never Smoker  . Smokeless tobacco: Never Used  Vaping Use  . Vaping Use: Never used    Substance Use Topics  . Alcohol use: No    Alcohol/week: 0.0 standard drinks  . Drug use: Never         OPHTHALMIC EXAM:  Base Eye Exam    Visual Acuity (ETDRS)      Right Left   Dist cc 20/40 +2 20/60 +2   Dist ph cc 20/30 +2 20/50 +2   Correction: Glasses       Tonometry (Tonopen, 10:11 AM)      Right Left   Pressure 12 12       Pupils      Pupils Dark Light Shape React APD   Right PERRL 4 3 Round Brisk None   Left PERRL 4 3 Round Brisk None       Visual Fields (Counting fingers)      Left Right    Full Full       Extraocular Movement      Right Left    Full Full       Neuro/Psych    Oriented x3: Yes   Mood/Affect: Normal       Dilation    Left eye: 1.0% Mydriacyl, 2.5% Phenylephrine @ 10:11 AM        Slit Lamp and Fundus Exam    External Exam      Right Left   External Normal Normal       Slit Lamp Exam      Right Left   Lids/Lashes Normal Normal   Conjunctiva/Sclera White and quiet White and quiet   Cornea Clear Clear   Anterior Chamber Deep and quiet Deep and quiet   Iris Round and reactive Round and reactive   Lens 2+ Nuclear sclerosis 2+ Nuclear sclerosis   Anterior Vitreous Normal Normal       Fundus Exam      Right Left   Posterior Vitreous  Normal   Disc  Normal   C/D Ratio  0.35   Macula  Clinically significant macular edema   Vessels  Severe NPDR   Periphery  Mild scatter pattern laser          IMAGING AND PROCEDURES  Imaging and Procedures for 01/15/20  OCT, Retina - OU - Both Eyes       Right Eye Quality was good. Scan locations included subfoveal. Central Foveal Thickness: 357. Progression has improved.   Left Eye Quality was good. Scan locations included subfoveal. Central Foveal Thickness: 384. Progression has improved. Findings include abnormal foveal contour.   Notes To me, OD improved overall after recent injection Eylea.  OS with improved retinal thickening on repeat Eylea.  Early at 6-week exam  interval OS, will  repeat injection OS today       Intravitreal Injection, Pharmacologic Agent - OS - Left Eye       Time Out 01/15/2020. 11:17 AM. Confirmed correct patient, procedure, site, and patient consented.   Anesthesia Topical anesthesia was used. Anesthetic medications included Akten 3.5%.   Procedure Preparation included Tobramycin 0.3%, 10% betadine to eyelids. A 30 gauge needle was used.   Injection:  2 mg aflibercept Alfonse Flavors) SOLN   NDC: [id number], Lot: [id number]   Route: Intravitreal, Site: Left Eye, Waste: 0 mg  Post-op Post injection exam found visual acuity of at least counting fingers. The patient tolerated the procedure well. There were no complications. The patient received written and verbal post procedure care education. Post injection medications were not given.                 ASSESSMENT/PLAN:  Severe nonproliferative diabetic retinopathy of left eye, with macular edema, associated with type 2 diabetes mellitus (HCC) CSME active, OS yet of central thickening has diminished by over 25 m since last injection left eye 6 weeks previous.  We will repeat injection intravitreal Eylea today.  Exam in 6 weeks left eye  Severe nonproliferative diabetic retinopathy of right eye, with macular edema, associated with type 2 diabetes mellitus ([place]) Follow-up OD as scheduled      ICD-10-CM   1. Severe nonproliferative diabetic retinopathy of left eye, with macular edema, associated with type 2 diabetes mellitus (HCC)  B16.9450 OCT, Retina - OU - Both Eyes    Color Fundus Photography Optos - OU - Both Eyes    Intravitreal Injection, Pharmacologic Agent - OS - Left Eye    aflibercept (EYLEA) SOLN 2 mg  2. Severe nonproliferative diabetic retinopathy of right eye, with macular edema, associated with type 2 diabetes mellitus ([facility])  E11.3411     1.  Injection of intravitreal Eylea OS today  2.  Follow-up for examination OD tomorrow and possible Eylea  injection.  3.  Ophthalmic Meds Ordered this visit:  Meds ordered this encounter  Medications  . aflibercept (EYLEA) SOLN 2 mg       Return in about 6 weeks (around 02/26/2020) for dilate, OS, EYLEA OCT.  There are no Patient Instructions on file for this visit.   Explained the diagnoses, plan, and follow up with the patient and they expressed understanding.  Patient expressed understanding of the importance of proper follow up care.   Clent Demark Rankin M.D. Diseases & Surgery of the Retina and Vitreous Retina & Diabetic [place] 01/15/20     Abbreviations: M myopia (nearsighted); A astigmatism; H hyperopia (farsighted); P presbyopia; Mrx spectacle prescription;  CTL contact lenses; OD right eye; OS left eye; OU both eyes  XT exotropia; ET esotropia; PEK punctate epithelial keratitis; PEE punctate epithelial erosions; DES dry eye syndrome; MGD meibomian gland dysfunction; ATs artificial tears; PFAT's preservative free artificial tears; [place] nuclear sclerotic cataract; PSC posterior subcapsular cataract; ERM epi-retinal membrane; PVD posterior vitreous detachment; RD retinal detachment; DM diabetes mellitus; DR diabetic retinopathy; NPDR non-proliferative diabetic retinopathy; PDR proliferative diabetic retinopathy; CSME clinically significant macular edema; DME diabetic macular edema; dbh dot blot hemorrhages; CWS cotton wool spot; POAG primary open angle glaucoma; C/D cup-to-disc ratio; HVF humphrey visual field; GVF goldmann visual field; OCT optical coherence tomography; IOP intraocular pressure; BRVO Branch retinal vein occlusion; CRVO central retinal vein occlusion; CRAO central retinal artery occlusion; BRAO branch retinal artery occlusion; RT retinal tear; SB scleral buckle; PPV pars plana vitrectomy; VH Vitreous  hemorrhage; PRP panretinal laser photocoagulation; IVK intravitreal kenalog; VMT vitreomacular traction; MH Macular hole;  NVD neovascularization of the disc; NVE  neovascularization elsewhere; AREDS age related eye disease study; ARMD age related macular degeneration; POAG primary open angle glaucoma; EBMD epithelial/anterior basement membrane dystrophy; ACIOL anterior chamber intraocular lens; IOL intraocular lens; PCIOL posterior chamber intraocular lens; Phaco/IOL phacoemulsification with intraocular lens placement; [place] photorefractive keratectomy; LASIK laser assisted in situ keratomileusis; HTN hypertension; DM diabetes mellitus; COPD chronic obstructive pulmonary disease

## 2020-01-15 NOTE — Assessment & Plan Note (Signed)
Follow-up OD as scheduled °

## 2020-01-15 NOTE — Assessment & Plan Note (Signed)
CSME active, OS yet of central thickening has diminished by over 25 m since last injection left eye 6 weeks previous.  We will repeat injection intravitreal Eylea today.  Exam in 6 weeks left eye

## 2020-01-16 ENCOUNTER — Ambulatory Visit (INDEPENDENT_AMBULATORY_CARE_PROVIDER_SITE_OTHER): Payer: PPO | Admitting: Ophthalmology

## 2020-01-16 ENCOUNTER — Encounter (INDEPENDENT_AMBULATORY_CARE_PROVIDER_SITE_OTHER): Payer: Self-pay | Admitting: Ophthalmology

## 2020-01-16 DIAGNOSIS — E113411 Type 2 diabetes mellitus with severe nonproliferative diabetic retinopathy with macular edema, right eye: Secondary | ICD-10-CM | POA: Diagnosis not present

## 2020-01-16 MED ORDER — AFLIBERCEPT 2MG/0.05ML IZ SOLN FOR KALEIDOSCOPE
2.0000 mg | INTRAVITREAL | Status: AC | PRN
  Administered 2020-01-16: 2 mg via INTRAVITREAL

## 2020-01-16 NOTE — Assessment & Plan Note (Signed)
CSME OD continues to slowly improve with ongoing intravitreal Eylea injection.  Currently at 5-week interval will repeat injection OD today and examination in 5 to 6 weeks

## 2020-01-16 NOTE — Progress Notes (Signed)
01/16/2020     CHIEF COMPLAINT Patient presents for Retina Follow Up   HISTORY OF PRESENT ILLNESS: Brian Herring is a 64 y.o. male who presents to the clinic today for:   HPI    Retina Follow Up    Patient presents with  Diabetic Retinopathy.  In right eye.  Duration of 5 weeks.  Since onset it is stable.          Comments    5 Week follow up - OCT OU, Poss Eylea OD Patient denies change in vision and overall has no complaints. LBS /// A1C        Last edited by Gerda Diss on 01/16/2020  1:53 PM. (History)      Referring physician: [name], MD [place],  [place] [address]  HISTORICAL INFORMATION:   Selected notes from the MEDICAL RECORD NUMBER    Lab Results  Component Value Date   HGBA1C 7.0 (H) 03/12/2018     CURRENT MEDICATIONS: No current outpatient medications on file. (Ophthalmic Drugs)   No current facility-administered medications for this visit. (Ophthalmic Drugs)   Current Outpatient Medications (Other)  Medication Sig  . aspirin EC 81 MG tablet Take 81 mg by mouth daily.  . blood glucose meter kit and supplies KIT Dispense based on patient and insurance preference. Use up to four times daily as directed. (FOR ICD-10: E 11.65).  Marland Kitchen ENTRESTO 97-103 MG TAKE 1 TABLET BY MOUTH TWICE DAILY  . furosemide (LASIX) 40 MG tablet Take 1 tablet (40 mg total) by mouth daily.  Marland Kitchen glimepiride (AMARYL) 4 MG tablet Take 0.5 tablets (2 mg total) by mouth every morning. (Patient taking differently: Take 4 mg by mouth every morning. )  . ivabradine ([name]) 5 MG TABS tablet Take 1 tablet (5 mg total) by mouth 2 (two) times daily with a meal.  . JARDIANCE 25 MG TABS tablet Take 1 tablet by mouth daily.  . Lancets (ONETOUCH DELICA PLUS [id number]) MISC Use to test blood sugar daily  . metoprolol succinate (TOPROL-XL) 25 MG 24 hr tablet TAKE 1 AND 1/2 TABLETS BY MOUTH DAILY  . mupirocin ointment (BACTROBAN) 2 % Apply to affected area three  times daily for 5 days  . ONE TOUCH ULTRA TEST test strip Use to test blood sugar daily  . rosuvastatin (CRESTOR) 10 MG tablet Take 1 tablet (10 mg total) by mouth daily.  Marland Kitchen spironolactone (ALDACTONE) 25 MG tablet Take 1 tablet (25 mg total) by mouth daily.   No current facility-administered medications for this visit. (Other)      REVIEW OF SYSTEMS:    ALLERGIES Allergies  Allergen Reactions  . Sulfur Rash  . Norvasc [Amlodipine Besylate] Rash  . Sulfa Antibiotics Rash    PAST MEDICAL HISTORY Past Medical History:  Diagnosis Date  . Arthritis    shoulder  . Cancer ([facility])    skin cancer on nose  . Cardiomyopathy ([place])   . Chronic systolic CHF (congestive heart failure) ([facility])   . Diabetes mellitus without complication ([facility])   . Hyperlipidemia   . Hypertension    Past Surgical History:  Procedure Laterality Date  . AMPUTATION Right 10/14/2017   Procedure: AMPUTATION  GREAT TOE;  Surgeon: [name], MD;  Location: [facility];  Service: Orthopedics;  Laterality: Right;  . AMPUTATION Right 10/18/2017   Procedure: RIGHT BELOW KNEE AMPUTATION;  Surgeon: [name], MD;  Location: [facility];  Service: Orthopedics;  Laterality: Right;  .  CYSTOSCOPY WITH INSERTION OF UROLIFT N/A 03/15/2018   Procedure: CYSTOSCOPY WITH INSERTION OF UROLIFT;  Surgeon: Franchot Gallo, MD;  [facility];  Service: Urology;  Laterality: N/A;  . SHOULDER SURGERY  01/05  . STUMP REVISION Right 12/15/2017   Procedure: REVISION RIGHT BELOW KNEE AMPUTATION;  Surgeon: [name], MD;  Location: [facility];  Service: Orthopedics;  Laterality: Right;    FAMILY HISTORY Family History  Problem Relation Age of Onset  . Pneumonia Mother        aspiration    SOCIAL HISTORY Social History   Tobacco Use  . Smoking status: Never Smoker  . Smokeless tobacco: Never Used  Vaping Use  . Vaping Use: Never used  Substance Use Topics  . Alcohol use: No    Alcohol/week: 0.0 standard drinks  . Drug use:  Never         OPHTHALMIC EXAM:  Base Eye Exam    Visual Acuity (Snellen - Linear)      Right Left   Dist cc 20/40-2 20/60-2   Dist ph cc NI 20/50-2   Correction: Glasses       Tonometry (Tonopen, 2:01 PM)      Right Left   Pressure 14 13       Pupils      Pupils Dark Light Shape React APD   Right PERRL 4 3 Round Brisk None   Left PERRL 4 3 Round Brisk None       Visual Fields (Counting fingers)      Left Right    Full Full       Extraocular Movement      Right Left    Full Full       Neuro/Psych    Oriented x3: Yes   Mood/Affect: Normal       Dilation    Right eye: 1.0% Mydriacyl, 2.5% Phenylephrine @ 2:01 PM        Slit Lamp and Fundus Exam    External Exam      Right Left   External Normal Normal       Slit Lamp Exam      Right Left   Lids/Lashes Normal Normal   Conjunctiva/Sclera White and quiet White and quiet   Cornea Clear Clear   Anterior Chamber Deep and quiet Deep and quiet   Iris Round and reactive Round and reactive   Lens 2+ Nuclear sclerosis 2+ Nuclear sclerosis   Anterior Vitreous Normal Normal       Fundus Exam      Right Left   Posterior Vitreous Posterior vitreous detachment    Disc Normal    C/D Ratio 0.2    Macula Clinically significant macular edema, Mild clinically significant macular edema, Hemorrhage, Microaneurysms, Macular thickening    Vessels Severe nonproliferative diabetic retinopathy    Periphery Normal           IMAGING AND PROCEDURES  Imaging and Procedures for 01/16/20  Intravitreal Injection, Pharmacologic Agent - OD - Right Eye       Time Out 01/16/2020. 2:59 PM. Confirmed correct patient, procedure, site, and patient consented.   Anesthesia Topical anesthesia was used. Anesthetic medications included Akten 3.5%.   Procedure Preparation included Ofloxacin , 10% betadine to eyelids. A 30 gauge needle was used.   Injection:  2 mg aflibercept Alfonse Flavors) SOLN   NDC: [id number], Lot:  [id number]   Route: Intravitreal, Site: Right Eye, Waste: 0 mg  Post-op Post injection exam found visual acuity of  at least counting fingers. The patient tolerated the procedure well. There were no complications. The patient received written and verbal post procedure care education. Post injection medications were not given.                 ASSESSMENT/PLAN:  Severe nonproliferative diabetic retinopathy of right eye, with macular edema, associated with type 2 diabetes mellitus (HCC) CSME OD continues to slowly improve with ongoing intravitreal Eylea injection.  Currently at 5-week interval will repeat injection OD today and examination in 5 to 6 weeks      ICD-10-CM   1. Severe nonproliferative diabetic retinopathy of right eye, with macular edema, associated with type 2 diabetes mellitus (HCC)  E11.3411 Intravitreal Injection, Pharmacologic Agent - OD - Right Eye    aflibercept (EYLEA) SOLN 2 mg    1.  Repeat intravitreal injection Eylea OD today and examination in 5 weeks  2.  OS, examination is scheduled and possible injection intravitreal Eylea 3.  Ophthalmic Meds Ordered this visit:  Meds ordered this encounter  Medications  . aflibercept (EYLEA) SOLN 2 mg       Return in about 5 weeks (around 02/20/2020) for dilate, OD, EYLEA OCT.  There are no Patient Instructions on file for this visit.   Explained the diagnoses, plan, and follow up with the patient and they expressed understanding.  Patient expressed understanding of the importance of proper follow up care.   Clent Demark Adali Pennings M.D. Diseases & Surgery of the Retina and Vitreous Retina & Diabetic [place] 01/16/20     Abbreviations: M myopia (nearsighted); A astigmatism; H hyperopia (farsighted); P presbyopia; Mrx spectacle prescription;  CTL contact lenses; OD right eye; OS left eye; OU both eyes  XT exotropia; ET esotropia; PEK punctate epithelial keratitis; PEE punctate epithelial erosions; DES dry eye  syndrome; MGD meibomian gland dysfunction; ATs artificial tears; PFAT's preservative free artificial tears; [place] nuclear sclerotic cataract; PSC posterior subcapsular cataract; ERM epi-retinal membrane; PVD posterior vitreous detachment; RD retinal detachment; DM diabetes mellitus; DR diabetic retinopathy; NPDR non-proliferative diabetic retinopathy; PDR proliferative diabetic retinopathy; CSME clinically significant macular edema; DME diabetic macular edema; dbh dot blot hemorrhages; CWS cotton wool spot; POAG primary open angle glaucoma; C/D cup-to-disc ratio; HVF humphrey visual field; GVF goldmann visual field; OCT optical coherence tomography; IOP intraocular pressure; BRVO Branch retinal vein occlusion; CRVO central retinal vein occlusion; CRAO central retinal artery occlusion; BRAO branch retinal artery occlusion; RT retinal tear; SB scleral buckle; PPV pars plana vitrectomy; VH Vitreous hemorrhage; PRP panretinal laser photocoagulation; IVK intravitreal kenalog; VMT vitreomacular traction; MH Macular hole;  NVD neovascularization of the disc; NVE neovascularization elsewhere; AREDS age related eye disease study; ARMD age related macular degeneration; POAG primary open angle glaucoma; EBMD epithelial/anterior basement membrane dystrophy; ACIOL anterior chamber intraocular lens; IOL intraocular lens; PCIOL posterior chamber intraocular lens; Phaco/IOL phacoemulsification with intraocular lens placement; [place] photorefractive keratectomy; LASIK laser assisted in situ keratomileusis; HTN hypertension; DM diabetes mellitus; COPD chronic obstructive pulmonary disease

## 2020-02-19 DIAGNOSIS — E7800 Pure hypercholesterolemia, unspecified: Secondary | ICD-10-CM | POA: Diagnosis not present

## 2020-02-19 DIAGNOSIS — I10 Essential (primary) hypertension: Secondary | ICD-10-CM | POA: Diagnosis not present

## 2020-02-19 DIAGNOSIS — E1169 Type 2 diabetes mellitus with other specified complication: Secondary | ICD-10-CM | POA: Diagnosis not present

## 2020-02-20 ENCOUNTER — Ambulatory Visit (INDEPENDENT_AMBULATORY_CARE_PROVIDER_SITE_OTHER): Payer: PPO | Admitting: Ophthalmology

## 2020-02-20 ENCOUNTER — Other Ambulatory Visit: Payer: Self-pay

## 2020-02-20 ENCOUNTER — Encounter (INDEPENDENT_AMBULATORY_CARE_PROVIDER_SITE_OTHER): Payer: Self-pay | Admitting: Ophthalmology

## 2020-02-20 DIAGNOSIS — E113411 Type 2 diabetes mellitus with severe nonproliferative diabetic retinopathy with macular edema, right eye: Secondary | ICD-10-CM | POA: Diagnosis not present

## 2020-02-20 MED ORDER — AFLIBERCEPT 2MG/0.05ML IZ SOLN FOR KALEIDOSCOPE
2.0000 mg | INTRAVITREAL | Status: AC | PRN
  Administered 2020-02-20: 2 mg via INTRAVITREAL

## 2020-02-20 NOTE — Assessment & Plan Note (Signed)
The nature of severe nonproliferative diabetic retinopathy discussed with the patient as well as the need for more frequent follow up and likely progression to proliferative disease in the near future. The options of continued observation versus panretinal photocoagulation at this time were reviewed as well as the risks, benefits, and alternatives. More recent option includes the use of ocular injectable medications to slow progression of retinal disease. Tight control of glucose, blood pressure, and serum lipid levels were recommended under the direction of general physician or endocrinologist, as well as avoidance of smoking and maintenance of normal body weight. The 2-year risk of progression to proliferative diabetic retinopathy is 60%.  OD, status post PRP #1, will need to complete PRP 1 day to decrease in vegF load

## 2020-02-20 NOTE — Progress Notes (Signed)
02/20/2020     CHIEF COMPLAINT Patient presents for Retina Follow Up   HISTORY OF PRESENT ILLNESS: Brian Herring is a 64 y.o. male who presents to the clinic today for:   HPI    Retina Follow Up    Patient presents with  Diabetic Retinopathy.  In both eyes.  This started 5 weeks ago.  Severity is mild.  Duration of 5 weeks.  Since onset it is stable.          Comments    5 Week Diabetic F/U OD, poss Eylea OD  Pt denies noticeable changes to [place] OU since last visit, but sts, "it might be a bit better." Pt denies ocular pain, flashes of light, or floaters OU.  LBS: 101 this AM       Last edited by Rockie Neighbours, [place] on 02/20/2020  2:14 PM. (History)      Referring physician: [name], MD [place],  [place] [id number]  HISTORICAL INFORMATION:   Selected notes from the MEDICAL RECORD NUMBER    Lab Results  Component Value Date   HGBA1C 7.0 (H) 03/12/2018     CURRENT MEDICATIONS: No current outpatient medications on file. (Ophthalmic Drugs)   No current facility-administered medications for this visit. (Ophthalmic Drugs)   Current Outpatient Medications (Other)  Medication Sig  . aspirin EC 81 MG tablet Take 81 mg by mouth daily.  . blood glucose meter kit and supplies KIT Dispense based on patient and insurance preference. Use up to four times daily as directed. (FOR ICD-10: E 11.65).  Marland Kitchen ENTRESTO 97-103 MG TAKE 1 TABLET BY MOUTH TWICE DAILY  . furosemide (LASIX) 40 MG tablet Take 1 tablet (40 mg total) by mouth daily.  Marland Kitchen glimepiride (AMARYL) 4 MG tablet Take 0.5 tablets (2 mg total) by mouth every morning. (Patient taking differently: Take 4 mg by mouth every morning. )  . ivabradine ([name]) 5 MG TABS tablet Take 1 tablet (5 mg total) by mouth 2 (two) times daily with a meal.  . JARDIANCE 25 MG TABS tablet Take 1 tablet by mouth daily.  . Lancets (ONETOUCH DELICA PLUS [id number]) MISC Use to test blood sugar daily  . metoprolol  succinate (TOPROL-XL) 25 MG 24 hr tablet TAKE 1 AND 1/2 TABLETS BY MOUTH DAILY  . mupirocin ointment (BACTROBAN) 2 % Apply to affected area three times daily for 5 days  . ONE TOUCH ULTRA TEST test strip Use to test blood sugar daily  . rosuvastatin (CRESTOR) 10 MG tablet Take 1 tablet (10 mg total) by mouth daily.  Marland Kitchen spironolactone (ALDACTONE) 25 MG tablet Take 1 tablet (25 mg total) by mouth daily.   No current facility-administered medications for this visit. (Other)      REVIEW OF SYSTEMS:    ALLERGIES Allergies  Allergen Reactions  . Sulfur Rash  . Norvasc [Amlodipine Besylate] Rash  . Sulfa Antibiotics Rash    PAST MEDICAL HISTORY Past Medical History:  Diagnosis Date  . Arthritis    shoulder  . Cancer ([facility])    skin cancer on nose  . Cardiomyopathy ([facility])   . Chronic systolic CHF (congestive heart failure) ([facility])   . Diabetes mellitus without complication ([facility])   . Hyperlipidemia   . Hypertension    Past Surgical History:  Procedure Laterality Date  . AMPUTATION Right 10/14/2017   Procedure: AMPUTATION  GREAT TOE;  Surgeon: [name], MD;  Location: [facility];  Service: Orthopedics;  Laterality: Right;  .  AMPUTATION Right 10/18/2017   Procedure: RIGHT BELOW KNEE AMPUTATION;  Surgeon: [name], MD;  Location: [facility];  Service: Orthopedics;  Laterality: Right;  . CYSTOSCOPY WITH INSERTION OF UROLIFT N/A 03/15/2018   Procedure: CYSTOSCOPY WITH INSERTION OF UROLIFT;  Surgeon: Franchot Gallo, MD;  Location: [facility];  Service: Urology;  Laterality: N/A;  . SHOULDER SURGERY  01/05  . STUMP REVISION Right 12/15/2017   Procedure: REVISION RIGHT BELOW KNEE AMPUTATION;  Surgeon: [name], MD;  Location: [facility];  Service: Orthopedics;  Laterality: Right;    FAMILY HISTORY Family History  Problem Relation Age of Onset  . Pneumonia Mother        aspiration    SOCIAL HISTORY Social History   Tobacco Use  . Smoking status: Never Smoker  . Smokeless tobacco:  Never Used  Vaping Use  . Vaping Use: Never used  Substance Use Topics  . Alcohol use: No    Alcohol/week: 0.0 standard drinks  . Drug use: Never         OPHTHALMIC EXAM:  Base Eye Exam    Visual Acuity (ETDRS)      Right Left   Dist cc 20/40 20/50 -2   Dist ph cc 20/25 +2 20/50 +2   Correction: Glasses       Tonometry (Tonopen, 2:17 PM)      Right Left   Pressure 10 11       Pupils      Pupils Dark Light Shape React APD   Right PERRL 4 3 Round Brisk None   Left PERRL 4 3 Round Brisk None       Visual Fields (Counting fingers)      Left Right    Full Full       Extraocular Movement      Right Left    Full Full       Neuro/Psych    Oriented x3: Yes   Mood/Affect: Normal       Dilation    Right eye: 1.0% Mydriacyl, 2.5% Phenylephrine @ 2:17 PM        Slit Lamp and Fundus Exam    External Exam      Right Left   External Normal Normal       Slit Lamp Exam      Right Left   Lids/Lashes Normal Normal   Conjunctiva/Sclera White and quiet White and quiet   Cornea Clear Clear   Anterior Chamber Deep and quiet Deep and quiet   Iris Round and reactive Round and reactive   Lens 2+ Nuclear sclerosis 2+ Nuclear sclerosis   Anterior Vitreous Normal Normal       Fundus Exam      Right Left   Posterior Vitreous Posterior vitreous detachment    Disc Normal    C/D Ratio 0.2    Macula Clinically significant macular edema, Mild clinically significant macular edema, Hemorrhage, Microaneurysms, Macular thickening    Vessels Severe nonproliferative diabetic retinopathy    Periphery Normal           IMAGING AND PROCEDURES  Imaging and Procedures for 02/20/20  OCT, Retina - OU - Both Eyes       Right Eye Quality was good. Scan locations included subfoveal. Central Foveal Thickness: 352. Progression has improved. Findings include cystoid macular edema.   Left Eye Quality was good. Scan locations included subfoveal. Central Foveal Thickness: 375.  Progression has improved. Findings include abnormal foveal contour, cystoid macular edema.  Intravitreal Injection, Pharmacologic Agent - OD - Right Eye       Time Out 02/20/2020. 3:16 PM. Confirmed correct patient, procedure, site, and patient consented.   Anesthesia Topical anesthesia was used. Anesthetic medications included Akten 3.5%.   Procedure Preparation included Tobramycin 0.3%, 10% betadine to eyelids, 5% betadine to ocular surface. A 30 gauge needle was used.   Injection:  2 mg aflibercept Alfonse Flavors) SOLN   NDC: [id number], Lot: [id number]   Route: Intravitreal, Site: Right Eye, Waste: 0 mg  Post-op Post injection exam found visual acuity of at least counting fingers. The patient tolerated the procedure well. There were no complications. The patient received written and verbal post procedure care education. Post injection medications were not given.                 ASSESSMENT/PLAN:  Severe nonproliferative diabetic retinopathy of right eye, with macular edema, associated with type 2 diabetes mellitus ([facility]) The nature of severe nonproliferative diabetic retinopathy discussed with the patient as well as the need for more frequent follow up and likely progression to proliferative disease in the near future. The options of continued observation versus panretinal photocoagulation at this time were reviewed as well as the risks, benefits, and alternatives. More recent option includes the use of ocular injectable medications to slow progression of retinal disease. Tight control of glucose, blood pressure, and serum lipid levels were recommended under the direction of general physician or endocrinologist, as well as avoidance of smoking and maintenance of normal body weight. The 2-year risk of progression to proliferative diabetic retinopathy is 60%.  OD, status post PRP #1, will need to complete PRP 1 day to decrease in vegF load      ICD-10-CM   1. Severe  nonproliferative diabetic retinopathy of right eye, with macular edema, associated with type 2 diabetes mellitus (HCC)  E11.3411 OCT, Retina - OU - Both Eyes    Intravitreal Injection, Pharmacologic Agent - OD - Right Eye    aflibercept (EYLEA) SOLN 2 mg    1.  CSME OD, improved at 5-week interval post Eylea injection, will repeat injection today and examination in 5 to 6 weeks  2.  Follow-up OS as scheduled possible injection  3.  Ophthalmic Meds Ordered this visit:  Meds ordered this encounter  Medications  . aflibercept (EYLEA) SOLN 2 mg       Return in about 5 weeks (around 03/26/2020) for dilate, OD, EYLEA OCT.  There are no Patient Instructions on file for this visit.   Explained the diagnoses, plan, and follow up with the patient and they expressed understanding.  Patient expressed understanding of the importance of proper follow up care.   Clent Demark Naly Schwanz M.D. Diseases & Surgery of the Retina and Vitreous Retina & Diabetic [facility] 02/20/20     Abbreviations: M myopia (nearsighted); A astigmatism; H hyperopia (farsighted); P presbyopia; Mrx spectacle prescription;  CTL contact lenses; OD right eye; OS left eye; OU both eyes  XT exotropia; ET esotropia; PEK punctate epithelial keratitis; PEE punctate epithelial erosions; DES dry eye syndrome; MGD meibomian gland dysfunction; ATs artificial tears; PFAT's preservative free artificial tears; [place] nuclear sclerotic cataract; PSC posterior subcapsular cataract; ERM epi-retinal membrane; PVD posterior vitreous detachment; RD retinal detachment; DM diabetes mellitus; DR diabetic retinopathy; NPDR non-proliferative diabetic retinopathy; PDR proliferative diabetic retinopathy; CSME clinically significant macular edema; DME diabetic macular edema; dbh dot blot hemorrhages; CWS cotton wool spot; POAG primary open angle glaucoma; C/D cup-to-disc ratio; HVF humphrey visual field;  GVF goldmann visual field; OCT optical coherence tomography;  IOP intraocular pressure; BRVO Branch retinal vein occlusion; CRVO central retinal vein occlusion; CRAO central retinal artery occlusion; BRAO branch retinal artery occlusion; RT retinal tear; SB scleral buckle; PPV pars plana vitrectomy; VH Vitreous hemorrhage; PRP panretinal laser photocoagulation; IVK intravitreal kenalog; VMT vitreomacular traction; MH Macular hole;  NVD neovascularization of the disc; NVE neovascularization elsewhere; AREDS age related eye disease study; ARMD age related macular degeneration; POAG primary open angle glaucoma; EBMD epithelial/anterior basement membrane dystrophy; ACIOL anterior chamber intraocular lens; IOL intraocular lens; PCIOL posterior chamber intraocular lens; Phaco/IOL phacoemulsification with intraocular lens placement; [place] photorefractive keratectomy; LASIK laser assisted in situ keratomileusis; HTN hypertension; DM diabetes mellitus; COPD chronic obstructive pulmonary disease

## 2020-02-21 ENCOUNTER — Other Ambulatory Visit: Payer: Self-pay | Admitting: Cardiology

## 2020-02-26 ENCOUNTER — Encounter (INDEPENDENT_AMBULATORY_CARE_PROVIDER_SITE_OTHER): Payer: Self-pay | Admitting: Ophthalmology

## 2020-02-26 ENCOUNTER — Other Ambulatory Visit: Payer: Self-pay

## 2020-02-26 ENCOUNTER — Ambulatory Visit (INDEPENDENT_AMBULATORY_CARE_PROVIDER_SITE_OTHER): Payer: PPO | Admitting: Ophthalmology

## 2020-02-26 DIAGNOSIS — E113412 Type 2 diabetes mellitus with severe nonproliferative diabetic retinopathy with macular edema, left eye: Secondary | ICD-10-CM | POA: Diagnosis not present

## 2020-02-26 MED ORDER — AFLIBERCEPT 2MG/0.05ML IZ SOLN FOR KALEIDOSCOPE
2.0000 mg | INTRAVITREAL | Status: AC | PRN
Start: 2020-02-26 — End: 2020-02-26
  Administered 2020-02-26: 2 mg via INTRAVITREAL

## 2020-02-26 NOTE — Patient Instructions (Signed)
Patient notify the office should new visual difficulties, decline occur

## 2020-02-26 NOTE — Progress Notes (Signed)
02/26/2020     CHIEF COMPLAINT Patient presents for Retina Follow Up   HISTORY OF PRESENT ILLNESS: Brian Herring is a 64 y.o. male who presents to the clinic today for:   HPI    Retina Follow Up    Patient presents with  Wet AMD.  In left eye.  This started 6 weeks ago.  Severity is mild.  Duration of 6 weeks.  Since onset it is stable.          Comments    6 Week Diabetic F/U OS, poss Eylea OS  Pt denies noticeable changes to [place] OU since last visit. Pt denies ocular pain, flashes of light, or floaters OU.  LBS: 117 this AM        Last edited by Rockie Neighbours, [place] on 02/26/2020 10:53 AM. (History)      Referring physician: [name], MD [place],  [place] [id number]  HISTORICAL INFORMATION:   Selected notes from the MEDICAL RECORD NUMBER    Lab Results  Component Value Date   HGBA1C 7.0 (H) 03/12/2018     CURRENT MEDICATIONS: No current outpatient medications on file. (Ophthalmic Drugs)   No current facility-administered medications for this visit. (Ophthalmic Drugs)   Current Outpatient Medications (Other)  Medication Sig  . aspirin EC 81 MG tablet Take 81 mg by mouth daily.  . blood glucose meter kit and supplies KIT Dispense based on patient and insurance preference. Use up to four times daily as directed. (FOR ICD-10: E 11.65).  Marland Kitchen ENTRESTO 97-103 MG TAKE 1 TABLET BY MOUTH TWICE DAILY  . furosemide (LASIX) 40 MG tablet Take 1 tablet (40 mg total) by mouth daily.  Marland Kitchen glimepiride (AMARYL) 4 MG tablet Take 0.5 tablets (2 mg total) by mouth every morning. (Patient taking differently: Take 4 mg by mouth every morning. )  . ivabradine ([name]) 5 MG TABS tablet Take 1 tablet (5 mg total) by mouth 2 (two) times daily with a meal.  . JARDIANCE 25 MG TABS tablet Take 1 tablet by mouth daily.  . Lancets (ONETOUCH DELICA PLUS [id number]) MISC Use to test blood sugar daily  . metoprolol succinate (TOPROL-XL) 25 MG 24 hr tablet TAKE 1 AND 1/2  TABLETS BY MOUTH DAILY  . mupirocin ointment (BACTROBAN) 2 % Apply to affected area three times daily for 5 days  . ONE TOUCH ULTRA TEST test strip Use to test blood sugar daily  . rosuvastatin (CRESTOR) 10 MG tablet Take 1 tablet (10 mg total) by mouth daily.  Marland Kitchen spironolactone (ALDACTONE) 25 MG tablet Take 1 tablet (25 mg total) by mouth daily.   No current facility-administered medications for this visit. (Other)      REVIEW OF SYSTEMS:    ALLERGIES Allergies  Allergen Reactions  . Sulfur Rash  . Norvasc [Amlodipine Besylate] Rash  . Sulfa Antibiotics Rash    PAST MEDICAL HISTORY Past Medical History:  Diagnosis Date  . Arthritis    shoulder  . Cancer ([facility])    skin cancer on nose  . Cardiomyopathy ([facility])   . Chronic systolic CHF (congestive heart failure) ([facility])   . Diabetes mellitus without complication ([facility])   . Hyperlipidemia   . Hypertension    Past Surgical History:  Procedure Laterality Date  . AMPUTATION Right 10/14/2017   Procedure: AMPUTATION  GREAT TOE;  Surgeon: [name], MD;  Location: [facility];  Service: Orthopedics;  Laterality: Right;  . AMPUTATION Right 10/18/2017   Procedure:  RIGHT BELOW KNEE AMPUTATION;  Surgeon: [name], MD;  Location: [facility];  Service: Orthopedics;  Laterality: Right;  . CYSTOSCOPY WITH INSERTION OF UROLIFT N/A 03/15/2018   Procedure: CYSTOSCOPY WITH INSERTION OF UROLIFT;  Surgeon: Franchot Gallo, MD;  [facility];  Service: Urology;  Laterality: N/A;  . SHOULDER SURGERY  01/05  . STUMP REVISION Right 12/15/2017   Procedure: REVISION RIGHT BELOW KNEE AMPUTATION;  Surgeon: [name], MD;  Location: [facility];  Service: Orthopedics;  Laterality: Right;    FAMILY HISTORY Family History  Problem Relation Age of Onset  . Pneumonia Mother        aspiration    SOCIAL HISTORY Social History   Tobacco Use  . Smoking status: Never Smoker  . Smokeless tobacco: Never Used  Vaping Use  . Vaping Use: Never used    Substance Use Topics  . Alcohol use: No    Alcohol/week: 0.0 standard drinks  . Drug use: Never         OPHTHALMIC EXAM:  Base Eye Exam    Visual Acuity (ETDRS)      Right Left   Dist cc 20/40 20/60 +2   Dist ph cc 20/25 +1 20/50 +2   Correction: Glasses       Tonometry (Tonopen, 11:02 AM)      Right Left   Pressure 12 15       Pupils      Pupils Dark Light Shape React APD   Right PERRL 4 3 Round Brisk None   Left PERRL 4 3 Round Brisk None       Visual Fields (Counting fingers)      Left Right    Full Full       Extraocular Movement      Right Left    Full Full       Neuro/Psych    Oriented x3: Yes   Mood/Affect: Normal       Dilation    Left eye: 1.0% Mydriacyl, 2.5% Phenylephrine @ 11:02 AM        Slit Lamp and Fundus Exam    External Exam      Right Left   External Normal Normal       Slit Lamp Exam      Right Left   Lids/Lashes Normal Normal   Conjunctiva/Sclera White and quiet White and quiet   Cornea Clear Clear   Anterior Chamber Deep and quiet Deep and quiet   Iris Round and reactive Round and reactive   Lens 2+ Nuclear sclerosis 2+ Nuclear sclerosis   Anterior Vitreous Normal Normal       Fundus Exam      Right Left   Posterior Vitreous  Normal   Disc  Normal   C/D Ratio  0.35   Macula  Clinically significant macular edema, Mild clinically significant macular edema   Vessels  Severe NPDR   Periphery  Moderate scatter pattern laser          IMAGING AND PROCEDURES  Imaging and Procedures for 02/26/20  OCT, Retina - OU - Both Eyes       Right Eye Quality was good. Scan locations included subfoveal. Central Foveal Thickness: 349. Progression has improved.   Left Eye Quality was good. Scan locations included subfoveal. Central Foveal Thickness: 381. Progression has been stable.   Notes CSME OD, has improved post recent injection, yet with some features of chronicity.  Overall vastly improved  OS, CSME similarly  improved, at 6-week interval injection today.  Some features are chronicity overall improved, repeat injection Eylea today       Intravitreal Injection, Pharmacologic Agent - OS - Left Eye       Time Out 02/26/2020. 11:35 AM. Confirmed correct patient, procedure, site, and patient consented.   Anesthesia Topical anesthesia was used. Anesthetic medications included Akten 3.5%.   Procedure Preparation included Ofloxacin , 10% betadine to eyelids, 5% betadine to ocular surface. A 30 gauge needle was used.   Injection:  2 mg aflibercept Alfonse Flavors) SOLN   NDC: [id number], Lot: [id number]   Route: Intravitreal, Site: Left Eye, Waste: 0 mg  Post-op Post injection exam found visual acuity of at least counting fingers. The patient tolerated the procedure well. There were no complications. Post injection medications were not given.                 ASSESSMENT/PLAN:  Severe nonproliferative diabetic retinopathy of left eye, with macular edema, associated with type 2 diabetes mellitus ([place]) Vastly improved retinal and macular thickening overall since inception of intravitreal injections.  Repeat intravitreal Eylea OS today and examination in 6 weeks      ICD-10-CM   1. Severe nonproliferative diabetic retinopathy of left eye, with macular edema, associated with type 2 diabetes mellitus (HCC)  U98.1191 OCT, Retina - OU - Both Eyes    Intravitreal Injection, Pharmacologic Agent - OS - Left Eye    aflibercept (EYLEA) SOLN 2 mg    1.  OS overall improved, repeat intravitreal Eylea today at 6-week interval.  Repeat examination OS in 6 weeks  2.  E, CSME overall improved after recent injection.  Return visit as scheduled right eye  3.  Ophthalmic Meds Ordered this visit:  Meds ordered this encounter  Medications  . aflibercept (EYLEA) SOLN 2 mg       Return in about 6 weeks (around 04/08/2020) for dilate, OS, EYLEA OCT.  Patient Instructions  Patient notify the office should  new visual difficulties, decline occur    Explained the diagnoses, plan, and follow up with the patient and they expressed understanding.  Patient expressed understanding of the importance of proper follow up care.   Clent Demark Janiyha Montufar M.D. Diseases & Surgery of the Retina and Vitreous Retina & Diabetic [facility] 02/26/20     Abbreviations: M myopia (nearsighted); A astigmatism; H hyperopia (farsighted); P presbyopia; Mrx spectacle prescription;  CTL contact lenses; OD right eye; OS left eye; OU both eyes  XT exotropia; ET esotropia; PEK punctate epithelial keratitis; PEE punctate epithelial erosions; DES dry eye syndrome; MGD meibomian gland dysfunction; ATs artificial tears; PFAT's preservative free artificial tears; [place] nuclear sclerotic cataract; PSC posterior subcapsular cataract; ERM epi-retinal membrane; PVD posterior vitreous detachment; RD retinal detachment; DM diabetes mellitus; DR diabetic retinopathy; NPDR non-proliferative diabetic retinopathy; PDR proliferative diabetic retinopathy; CSME clinically significant macular edema; DME diabetic macular edema; dbh dot blot hemorrhages; CWS cotton wool spot; POAG primary open angle glaucoma; C/D cup-to-disc ratio; HVF humphrey visual field; GVF goldmann visual field; OCT optical coherence tomography; IOP intraocular pressure; BRVO Branch retinal vein occlusion; CRVO central retinal vein occlusion; CRAO central retinal artery occlusion; BRAO branch retinal artery occlusion; RT retinal tear; SB scleral buckle; PPV pars plana vitrectomy; VH Vitreous hemorrhage; PRP panretinal laser photocoagulation; IVK intravitreal kenalog; VMT vitreomacular traction; MH Macular hole;  NVD neovascularization of the disc; NVE neovascularization elsewhere; AREDS age related eye disease study; ARMD age related macular degeneration; POAG primary open angle glaucoma; EBMD epithelial/anterior  basement membrane dystrophy; ACIOL anterior chamber intraocular lens; IOL  intraocular lens; PCIOL posterior chamber intraocular lens; Phaco/IOL phacoemulsification with intraocular lens placement; [place] photorefractive keratectomy; LASIK laser assisted in situ keratomileusis; HTN hypertension; DM diabetes mellitus; COPD chronic obstructive pulmonary disease

## 2020-02-26 NOTE — Assessment & Plan Note (Signed)
Vastly improved retinal and macular thickening overall since inception of intravitreal injections.  Repeat intravitreal Eylea OS today and examination in 6 weeks

## 2020-03-04 DIAGNOSIS — E1169 Type 2 diabetes mellitus with other specified complication: Secondary | ICD-10-CM | POA: Diagnosis not present

## 2020-03-04 DIAGNOSIS — I10 Essential (primary) hypertension: Secondary | ICD-10-CM | POA: Diagnosis not present

## 2020-03-04 DIAGNOSIS — E7800 Pure hypercholesterolemia, unspecified: Secondary | ICD-10-CM | POA: Diagnosis not present

## 2020-03-24 DIAGNOSIS — E1169 Type 2 diabetes mellitus with other specified complication: Secondary | ICD-10-CM | POA: Diagnosis not present

## 2020-03-26 ENCOUNTER — Encounter (INDEPENDENT_AMBULATORY_CARE_PROVIDER_SITE_OTHER): Payer: PPO | Admitting: Ophthalmology

## 2020-03-27 DIAGNOSIS — E1169 Type 2 diabetes mellitus with other specified complication: Secondary | ICD-10-CM | POA: Diagnosis not present

## 2020-03-27 DIAGNOSIS — E7800 Pure hypercholesterolemia, unspecified: Secondary | ICD-10-CM | POA: Diagnosis not present

## 2020-03-27 DIAGNOSIS — I10 Essential (primary) hypertension: Secondary | ICD-10-CM | POA: Diagnosis not present

## 2020-04-06 ENCOUNTER — Ambulatory Visit (INDEPENDENT_AMBULATORY_CARE_PROVIDER_SITE_OTHER): Payer: PPO | Admitting: Ophthalmology

## 2020-04-06 ENCOUNTER — Encounter (INDEPENDENT_AMBULATORY_CARE_PROVIDER_SITE_OTHER): Payer: Self-pay | Admitting: Ophthalmology

## 2020-04-06 ENCOUNTER — Other Ambulatory Visit: Payer: Self-pay

## 2020-04-06 DIAGNOSIS — E113411 Type 2 diabetes mellitus with severe nonproliferative diabetic retinopathy with macular edema, right eye: Secondary | ICD-10-CM

## 2020-04-06 MED ORDER — AFLIBERCEPT 2MG/0.05ML IZ SOLN FOR KALEIDOSCOPE
2.0000 mg | INTRAVITREAL | Status: AC | PRN
  Administered 2020-04-06: 2 mg via INTRAVITREAL

## 2020-04-06 NOTE — Progress Notes (Signed)
04/06/2020     CHIEF COMPLAINT Patient presents for Retina Follow Up   HISTORY OF PRESENT ILLNESS: Brian Herring is a 64 y.o. male who presents to the clinic today for:   HPI    Retina Follow Up    Patient presents with  Diabetic Retinopathy.  In right eye.  This started 7 weeks ago.  Severity is mild.  Duration of 7 weeks.  Since onset it is stable.          Comments    7 Week Diabetic F/U OD, poss Eylea OD  Pt reports stable [facility] OU. Pt sts he is now able to read road signs OU. LBS: 126 this AM       Last edited by Rockie Neighbours, [place] on 04/06/2020 10:15 AM. (History)      Referring physician: [name], MD [place],  [place] [address]  HISTORICAL INFORMATION:   Selected notes from the MEDICAL RECORD NUMBER    Lab Results  Component Value Date   HGBA1C 7.0 (H) 03/12/2018     CURRENT MEDICATIONS: No current outpatient medications on file. (Ophthalmic Drugs)   No current facility-administered medications for this visit. (Ophthalmic Drugs)   Current Outpatient Medications (Other)  Medication Sig  . aspirin EC 81 MG tablet Take 81 mg by mouth daily.  . blood glucose meter kit and supplies KIT Dispense based on patient and insurance preference. Use up to four times daily as directed. (FOR ICD-10: E 11.65).  Marland Kitchen ENTRESTO 97-103 MG TAKE 1 TABLET BY MOUTH TWICE DAILY  . furosemide (LASIX) 40 MG tablet Take 1 tablet (40 mg total) by mouth daily.  Marland Kitchen glimepiride (AMARYL) 4 MG tablet Take 0.5 tablets (2 mg total) by mouth every morning. (Patient taking differently: Take 4 mg by mouth every morning. )  . ivabradine ([name]) 5 MG TABS tablet Take 1 tablet (5 mg total) by mouth 2 (two) times daily with a meal.  . JARDIANCE 25 MG TABS tablet Take 1 tablet by mouth daily.  . Lancets (ONETOUCH DELICA PLUS [id number]) MISC Use to test blood sugar daily  . metoprolol succinate (TOPROL-XL) 25 MG 24 hr tablet TAKE 1 AND 1/2 TABLETS BY MOUTH DAILY  .  mupirocin ointment (BACTROBAN) 2 % Apply to affected area three times daily for 5 days  . ONE TOUCH ULTRA TEST test strip Use to test blood sugar daily  . rosuvastatin (CRESTOR) 10 MG tablet Take 1 tablet (10 mg total) by mouth daily.  Marland Kitchen spironolactone (ALDACTONE) 25 MG tablet Take 1 tablet (25 mg total) by mouth daily.   No current facility-administered medications for this visit. (Other)      REVIEW OF SYSTEMS:    ALLERGIES Allergies  Allergen Reactions  . Sulfur Rash  . Norvasc [Amlodipine Besylate] Rash  . Sulfa Antibiotics Rash    PAST MEDICAL HISTORY Past Medical History:  Diagnosis Date  . Arthritis    shoulder  . Cancer ([place])    skin cancer on nose  . Cardiomyopathy ([place])   . Chronic systolic CHF (congestive heart failure) ([place])   . Diabetes mellitus without complication ([place])   . Hyperlipidemia   . Hypertension    Past Surgical History:  Procedure Laterality Date  . AMPUTATION Right 10/14/2017   Procedure: AMPUTATION  GREAT TOE;  Surgeon: [name], MD;  Location: [facility];  Service: Orthopedics;  Laterality: Right;  . AMPUTATION Right 10/18/2017   Procedure: RIGHT BELOW KNEE AMPUTATION;  Surgeon:  [name], MD;  Location: [facility];  Service: Orthopedics;  Laterality: Right;  . CYSTOSCOPY WITH INSERTION OF UROLIFT N/A 03/15/2018   Procedure: CYSTOSCOPY WITH INSERTION OF UROLIFT;  Surgeon: Franchot Gallo, MD;  Location: [facility];  Service: Urology;  Laterality: N/A;  . SHOULDER SURGERY  01/05  . STUMP REVISION Right 12/15/2017   Procedure: REVISION RIGHT BELOW KNEE AMPUTATION;  Surgeon: [name], MD;  Location: [facility];  Service: Orthopedics;  Laterality: Right;    FAMILY HISTORY Family History  Problem Relation Age of Onset  . Pneumonia Mother        aspiration    SOCIAL HISTORY Social History   Tobacco Use  . Smoking status: Never Smoker  . Smokeless tobacco: Never Used  Vaping Use  . Vaping Use: Never used  Substance Use Topics  .  Alcohol use: No    Alcohol/week: 0.0 standard drinks  . Drug use: Never         OPHTHALMIC EXAM:  Base Eye Exam    Visual Acuity (ETDRS)      Right Left   Dist cc 20/40 -2 20/60 +2   Dist ph cc 20/30 +2 20/50 +2   Correction: Glasses       Tonometry (Tonopen, 10:16 AM)      Right Left   Pressure 10 10       Pupils      Pupils Dark Light Shape React APD   Right PERRL 4 3 Round Brisk None   Left PERRL 4 3 Round Brisk None       Visual Fields (Counting fingers)      Left Right    Full Full       Extraocular Movement      Right Left    Full Full       Neuro/Psych    Oriented x3: Yes   Mood/Affect: Normal       Dilation    Right eye: 1.0% Mydriacyl, 2.5% Phenylephrine @ 10:22 AM        Slit Lamp and Fundus Exam    External Exam      Right Left   External Normal Normal       Slit Lamp Exam      Right Left   Lids/Lashes Normal Normal   Conjunctiva/Sclera White and quiet White and quiet   Cornea Clear Clear   Anterior Chamber Deep and quiet Deep and quiet   Iris Round and reactive Round and reactive   Lens 2+ Nuclear sclerosis 2+ Nuclear sclerosis   Anterior Vitreous Normal Normal       Fundus Exam      Right Left   Posterior Vitreous Posterior vitreous detachment    Disc Normal    C/D Ratio 0.2    Macula Clinically significant macular edema, Mild clinically significant macular edema, Hemorrhage, Microaneurysms, Macular thickening    Vessels Severe nonproliferative diabetic retinopathy    Periphery Normal, moderate scatter PRP nasal.  Room temporally and superiorly for additional laser           IMAGING AND PROCEDURES  Imaging and Procedures for 04/06/20  OCT, Retina - OU - Both Eyes       Right Eye Quality was good. Scan locations included subfoveal. Central Foveal Thickness: 327. Progression has improved. Findings include cystoid macular edema.   Left Eye Quality was good. Scan locations included subfoveal. Central Foveal Thickness:  360. Progression has improved. Findings include cystoid macular edema.   Notes Much  less CSME OU, on intravitreal Eylea, will repeat injection today OD.       Intravitreal Injection, Pharmacologic Agent - OD - Right Eye       Time Out 04/06/2020. 11:26 AM. Confirmed correct patient, procedure, site, and patient consented.   Anesthesia Topical anesthesia was used. Anesthetic medications included Akten 3.5%.   Procedure Preparation included 10% betadine to eyelids, 5% betadine to ocular surface, Tobramycin 0.3%, Ofloxacin . A 30 gauge needle was used.   Injection:  2 mg aflibercept Alfonse Flavors) SOLN   NDC: [id number], Lot: [id number]   Route: Intravitreal, Site: Right Eye, Waste: 0 mg  Post-op Post injection exam found visual acuity of at least counting fingers. The patient tolerated the procedure well. There were no complications. The patient received written and verbal post procedure care education. Post injection medications were not given.                 ASSESSMENT/PLAN:  Severe nonproliferative diabetic retinopathy of right eye, with macular edema, associated with type 2 diabetes mellitus (HCC) CSME OD improved, will deliver intravitreal Eylea today, will likely need additional PRP temporally OD      ICD-10-CM   1. Severe nonproliferative diabetic retinopathy of right eye, with macular edema, associated with type 2 diabetes mellitus (HCC)  E11.3411 OCT, Retina - OU - Both Eyes    Intravitreal Injection, Pharmacologic Agent - OD - Right Eye    aflibercept (EYLEA) SOLN 2 mg    1.  OD follow-up in 2 weeks for PRP  2.  OD follow-up in 5 weeks for OCT dilation OD and possible injection Eylea  3.  OS, follow-up as scheduled  Ophthalmic Meds Ordered this visit:  Meds ordered this encounter  Medications  . aflibercept (EYLEA) SOLN 2 mg       Return ,, And 2 weeks for PRP right eye, for dilate, OD, EYLEA OCT.  There are no Patient Instructions on file for this  visit.   Explained the diagnoses, plan, and follow up with the patient and they expressed understanding.  Patient expressed understanding of the importance of proper follow up care.   Clent Demark Rylah Fukuda M.D. Diseases & Surgery of the Retina and Vitreous Retina & Diabetic [place] 04/06/20     Abbreviations: M myopia (nearsighted); A astigmatism; H hyperopia (farsighted); P presbyopia; Mrx spectacle prescription;  CTL contact lenses; OD right eye; OS left eye; OU both eyes  XT exotropia; ET esotropia; PEK punctate epithelial keratitis; PEE punctate epithelial erosions; DES dry eye syndrome; MGD meibomian gland dysfunction; ATs artificial tears; PFAT's preservative free artificial tears; [place] nuclear sclerotic cataract; PSC posterior subcapsular cataract; ERM epi-retinal membrane; PVD posterior vitreous detachment; RD retinal detachment; DM diabetes mellitus; DR diabetic retinopathy; NPDR non-proliferative diabetic retinopathy; PDR proliferative diabetic retinopathy; CSME clinically significant macular edema; DME diabetic macular edema; dbh dot blot hemorrhages; CWS cotton wool spot; POAG primary open angle glaucoma; C/D cup-to-disc ratio; HVF humphrey visual field; GVF goldmann visual field; OCT optical coherence tomography; IOP intraocular pressure; BRVO Branch retinal vein occlusion; CRVO central retinal vein occlusion; CRAO central retinal artery occlusion; BRAO branch retinal artery occlusion; RT retinal tear; SB scleral buckle; PPV pars plana vitrectomy; VH Vitreous hemorrhage; PRP panretinal laser photocoagulation; IVK intravitreal kenalog; VMT vitreomacular traction; MH Macular hole;  NVD neovascularization of the disc; NVE neovascularization elsewhere; AREDS age related eye disease study; ARMD age related macular degeneration; POAG primary open angle glaucoma; EBMD epithelial/anterior basement membrane dystrophy; ACIOL anterior chamber intraocular lens; IOL intraocular  lens; PCIOL posterior chamber  intraocular lens; Phaco/IOL phacoemulsification with intraocular lens placement; [place] photorefractive keratectomy; LASIK laser assisted in situ keratomileusis; HTN hypertension; DM diabetes mellitus; COPD chronic obstructive pulmonary disease

## 2020-04-06 NOTE — Assessment & Plan Note (Signed)
CSME OD improved, will deliver intravitreal Eylea today, will likely need additional PRP temporally OD

## 2020-04-09 ENCOUNTER — Ambulatory Visit (INDEPENDENT_AMBULATORY_CARE_PROVIDER_SITE_OTHER): Payer: PPO | Admitting: Ophthalmology

## 2020-04-09 ENCOUNTER — Other Ambulatory Visit: Payer: Self-pay

## 2020-04-09 ENCOUNTER — Encounter (INDEPENDENT_AMBULATORY_CARE_PROVIDER_SITE_OTHER): Payer: Self-pay | Admitting: Ophthalmology

## 2020-04-09 DIAGNOSIS — E113412 Type 2 diabetes mellitus with severe nonproliferative diabetic retinopathy with macular edema, left eye: Secondary | ICD-10-CM

## 2020-04-09 MED ORDER — AFLIBERCEPT 2MG/0.05ML IZ SOLN FOR KALEIDOSCOPE
2.0000 mg | INTRAVITREAL | Status: AC | PRN
  Administered 2020-04-09: 2 mg via INTRAVITREAL

## 2020-04-09 NOTE — Assessment & Plan Note (Addendum)
History of massive CSME OS, now vastly improved overall, currently at 6-week follow-up.  Only minor improvements are occurring at 6-week interval now, repeat injection OS today and examination OS in 8 weeks

## 2020-04-09 NOTE — Patient Instructions (Signed)
Patient asked to report any new onset visual acuity decline or distortions

## 2020-04-09 NOTE — Progress Notes (Signed)
04/09/2020     CHIEF COMPLAINT Patient presents for Retina Follow Up   HISTORY OF PRESENT ILLNESS: Brian Herring is a 64 y.o. male who presents to the clinic today for:   HPI    Retina Follow Up    Patient presents with  Diabetic Retinopathy.  In left eye.  This started 6 weeks ago.  Severity is mild.  Duration of 6 weeks.  Since onset it is stable.          Comments    6 Week Diabetic F/U OS, poss Eylea OS  Pt denies noticeable changes to [place] OU since last visit. Pt denies ocular pain, flashes of light, or floaters OU.  LBS: 108 this AM       Last edited by Rockie Neighbours, [place] on 04/09/2020 10:27 AM. (History)      Referring physician: [name], MD [place],  [place] [address]  HISTORICAL INFORMATION:   Selected notes from the MEDICAL RECORD NUMBER    Lab Results  Component Value Date   HGBA1C 7.0 (H) 03/12/2018     CURRENT MEDICATIONS: No current outpatient medications on file. (Ophthalmic Drugs)   No current facility-administered medications for this visit. (Ophthalmic Drugs)   Current Outpatient Medications (Other)  Medication Sig  . aspirin EC 81 MG tablet Take 81 mg by mouth daily.  . blood glucose meter kit and supplies KIT Dispense based on patient and insurance preference. Use up to four times daily as directed. (FOR ICD-10: E 11.65).  Marland Kitchen ENTRESTO 97-103 MG TAKE 1 TABLET BY MOUTH TWICE DAILY  . furosemide (LASIX) 40 MG tablet Take 1 tablet (40 mg total) by mouth daily.  Marland Kitchen glimepiride (AMARYL) 4 MG tablet Take 0.5 tablets (2 mg total) by mouth every morning. (Patient taking differently: Take 4 mg by mouth every morning. )  . ivabradine ([name]) 5 MG TABS tablet Take 1 tablet (5 mg total) by mouth 2 (two) times daily with a meal.  . JARDIANCE 25 MG TABS tablet Take 1 tablet by mouth daily.  . Lancets (ONETOUCH DELICA PLUS [id number]) MISC Use to test blood sugar daily  . metoprolol succinate (TOPROL-XL) 25 MG 24 hr tablet TAKE  1 AND 1/2 TABLETS BY MOUTH DAILY  . mupirocin ointment (BACTROBAN) 2 % Apply to affected area three times daily for 5 days  . ONE TOUCH ULTRA TEST test strip Use to test blood sugar daily  . rosuvastatin (CRESTOR) 10 MG tablet Take 1 tablet (10 mg total) by mouth daily.  Marland Kitchen spironolactone (ALDACTONE) 25 MG tablet Take 1 tablet (25 mg total) by mouth daily.   No current facility-administered medications for this visit. (Other)      REVIEW OF SYSTEMS:    ALLERGIES Allergies  Allergen Reactions  . Sulfur Rash  . Norvasc [Amlodipine Besylate] Rash  . Sulfa Antibiotics Rash    PAST MEDICAL HISTORY Past Medical History:  Diagnosis Date  . Arthritis    shoulder  . Cancer ([place])    skin cancer on nose  . Cardiomyopathy ([place])   . Chronic systolic CHF (congestive heart failure) ([place])   . Diabetes mellitus without complication ([place])   . Hyperlipidemia   . Hypertension    Past Surgical History:  Procedure Laterality Date  . AMPUTATION Right 10/14/2017   Procedure: AMPUTATION  GREAT TOE;  Surgeon: [name], MD;  Location: [facility];  Service: Orthopedics;  Laterality: Right;  . AMPUTATION Right 10/18/2017   Procedure: RIGHT  BELOW KNEE AMPUTATION;  Surgeon: [name], MD;  Location: [facility];  Service: Orthopedics;  Laterality: Right;  . CYSTOSCOPY WITH INSERTION OF UROLIFT N/A 03/15/2018   Procedure: CYSTOSCOPY WITH INSERTION OF UROLIFT;  Surgeon: Franchot Gallo, MD;  [facility];  Service: Urology;  Laterality: N/A;  . SHOULDER SURGERY  01/05  . STUMP REVISION Right 12/15/2017   Procedure: REVISION RIGHT BELOW KNEE AMPUTATION;  Surgeon: [name], MD;  Location: [facility];  Service: Orthopedics;  Laterality: Right;    FAMILY HISTORY Family History  Problem Relation Age of Onset  . Pneumonia Mother        aspiration    SOCIAL HISTORY Social History   Tobacco Use  . Smoking status: Never Smoker  . Smokeless tobacco: Never Used  Vaping Use  . Vaping Use: Never  used  Substance Use Topics  . Alcohol use: No    Alcohol/week: 0.0 standard drinks  . Drug use: Never         OPHTHALMIC EXAM:  Base Eye Exam    Visual Acuity (ETDRS)      Right Left   Dist cc 20/40 +2 20/50 +2   Dist ph cc 20/25 +1 20/40 -2   Correction: Glasses       Tonometry (Tonopen, 10:28 AM)      Right Left   Pressure 17 18       Pupils      Pupils Dark Light Shape React APD   Right PERRL 4 3 Round Brisk None   Left PERRL 4 3 Round Brisk None       Visual Fields (Counting fingers)      Left Right    Full Full       Extraocular Movement      Right Left    Full Full       Neuro/Psych    Oriented x3: Yes   Mood/Affect: Normal       Dilation    Left eye: 1.0% Mydriacyl, 2.5% Phenylephrine @ 10:38 AM        Slit Lamp and Fundus Exam    External Exam      Right Left   External Normal Normal       Slit Lamp Exam      Right Left   Lids/Lashes Normal Normal   Conjunctiva/Sclera White and quiet White and quiet   Cornea Clear Clear   Anterior Chamber Deep and quiet Deep and quiet   Iris Round and reactive Round and reactive   Lens 2+ Nuclear sclerosis 2+ Nuclear sclerosis   Anterior Vitreous Normal Normal       Fundus Exam      Right Left   Posterior Vitreous  Normal   Disc  Normal   C/D Ratio  0.35   Macula  Clinically significant macular edema, Mild clinically significant macular edema   Vessels  Severe NPDR   Periphery  Moderate scatter pattern laser          IMAGING AND PROCEDURES  Imaging and Procedures for 04/09/20  OCT, Retina - OU - Both Eyes       Right Eye Quality was good. Scan locations included subfoveal. Central Foveal Thickness: 323. Progression has improved.   Left Eye Quality was good. Scan locations included subfoveal. Central Foveal Thickness: 362. Progression has improved.   Notes CSME OD, vastly improved overall slightly improved 3 days post recent injection Eylea.  Follow-up as scheduled  OS, improved  CSME diffusely, some  of the residual CSME may be due to macular nonperfusion, will repeat injection today at 6-week and follow-up examination in 8 weeks left eye        Intravitreal Injection, Pharmacologic Agent - OS - Left Eye       Time Out 04/09/2020. 11:31 AM. Confirmed correct patient, procedure, site, and patient consented.   Anesthesia Topical anesthesia was used. Anesthetic medications included Akten 3.5%.   Procedure Preparation included 10% betadine to eyelids, 5% betadine to ocular surface, Tobramycin 0.3%. A 30 gauge needle was used.   Injection:  2 mg aflibercept Alfonse Flavors) SOLN   NDC: [id number], Lot: [id number]   Route: Intravitreal, Site: Left Eye, Waste: 0 mg  Post-op Post injection exam found visual acuity of at least counting fingers. The patient tolerated the procedure well. There were no complications. Post injection medications were not given.                 ASSESSMENT/PLAN:  Severe nonproliferative diabetic retinopathy of left eye, with macular edema, associated with type 2 diabetes mellitus ([place]) History of massive CSME OS, now vastly improved overall, currently at 6-week follow-up.  Only minor improvements are occurring at 6-week interval now, repeat injection OS today and examination OS in 8 weeks      ICD-10-CM   1. Severe nonproliferative diabetic retinopathy of left eye, with macular edema, associated with type 2 diabetes mellitus (HCC)  D22.0254 OCT, Retina - OU - Both Eyes    Intravitreal Injection, Pharmacologic Agent - OS - Left Eye    aflibercept (EYLEA) SOLN 2 mg    1.  2.  3.  Ophthalmic Meds Ordered this visit:  Meds ordered this encounter  Medications  . aflibercept (EYLEA) SOLN 2 mg       Return in about 8 weeks (around 06/04/2020) for dilate, OS, EYLEA OCT.  Patient Instructions  Patient asked to report any new onset visual acuity decline or distortions    Explained the diagnoses, plan, and follow up with the  patient and they expressed understanding.  Patient expressed understanding of the importance of proper follow up care.   Clent Demark Lianny Molter M.D. Diseases & Surgery of the Retina and Vitreous Retina & Diabetic [place] 04/09/20     Abbreviations: M myopia (nearsighted); A astigmatism; H hyperopia (farsighted); P presbyopia; Mrx spectacle prescription;  CTL contact lenses; OD right eye; OS left eye; OU both eyes  XT exotropia; ET esotropia; PEK punctate epithelial keratitis; PEE punctate epithelial erosions; DES dry eye syndrome; MGD meibomian gland dysfunction; ATs artificial tears; PFAT's preservative free artificial tears; [place] nuclear sclerotic cataract; PSC posterior subcapsular cataract; ERM epi-retinal membrane; PVD posterior vitreous detachment; RD retinal detachment; DM diabetes mellitus; DR diabetic retinopathy; NPDR non-proliferative diabetic retinopathy; PDR proliferative diabetic retinopathy; CSME clinically significant macular edema; DME diabetic macular edema; dbh dot blot hemorrhages; CWS cotton wool spot; POAG primary open angle glaucoma; C/D cup-to-disc ratio; HVF humphrey visual field; GVF goldmann visual field; OCT optical coherence tomography; IOP intraocular pressure; BRVO Branch retinal vein occlusion; CRVO central retinal vein occlusion; CRAO central retinal artery occlusion; BRAO branch retinal artery occlusion; RT retinal tear; SB scleral buckle; PPV pars plana vitrectomy; VH Vitreous hemorrhage; PRP panretinal laser photocoagulation; IVK intravitreal kenalog; VMT vitreomacular traction; MH Macular hole;  NVD neovascularization of the disc; NVE neovascularization elsewhere; AREDS age related eye disease study; ARMD age related macular degeneration; POAG primary open angle glaucoma; EBMD epithelial/anterior basement membrane dystrophy; ACIOL anterior chamber intraocular lens; IOL intraocular lens; PCIOL posterior  chamber intraocular lens; Phaco/IOL phacoemulsification with intraocular  lens placement; [place] photorefractive keratectomy; LASIK laser assisted in situ keratomileusis; HTN hypertension; DM diabetes mellitus; COPD chronic obstructive pulmonary disease

## 2020-04-22 ENCOUNTER — Encounter (INDEPENDENT_AMBULATORY_CARE_PROVIDER_SITE_OTHER): Payer: Self-pay | Admitting: Ophthalmology

## 2020-04-22 ENCOUNTER — Ambulatory Visit (INDEPENDENT_AMBULATORY_CARE_PROVIDER_SITE_OTHER): Payer: PPO | Admitting: Ophthalmology

## 2020-04-22 ENCOUNTER — Other Ambulatory Visit: Payer: Self-pay

## 2020-04-22 DIAGNOSIS — E113411 Type 2 diabetes mellitus with severe nonproliferative diabetic retinopathy with macular edema, right eye: Secondary | ICD-10-CM | POA: Diagnosis not present

## 2020-04-22 NOTE — Assessment & Plan Note (Signed)
CSME OD dramatically improved post multiinjections Eylea, will deliver PRP inferiorly on temporal today to decrease vegF burden

## 2020-04-22 NOTE — Progress Notes (Signed)
04/22/2020     CHIEF COMPLAINT Patient presents for Retina Follow Up   HISTORY OF PRESENT ILLNESS: Brian Herring is a 64 y.o. male who presents to the clinic today for:   HPI    Retina Follow Up    Patient presents with  Diabetic Retinopathy.  In right eye.  This started 2 weeks ago.  Severity is mild.  Duration of 2 weeks.  Since onset it is stable.          Comments    2 Week PRP OD  Pt denies noticeable changes to [place] OU since last visit. Pt denies ocular pain, flashes of light, or changes to mild floaters OU.  LBS: 106 this AM        Last edited by Rockie Neighbours, [place] on 04/22/2020  8:59 AM. (History)      Referring physician: [name], MD [place],  [place] [address]  HISTORICAL INFORMATION:   Selected notes from the MEDICAL RECORD NUMBER    Lab Results  Component Value Date   HGBA1C 7.0 (H) 03/12/2018     CURRENT MEDICATIONS: No current outpatient medications on file. (Ophthalmic Drugs)   No current facility-administered medications for this visit. (Ophthalmic Drugs)   Current Outpatient Medications (Other)  Medication Sig  . aspirin EC 81 MG tablet Take 81 mg by mouth daily.  . blood glucose meter kit and supplies KIT Dispense based on patient and insurance preference. Use up to four times daily as directed. (FOR ICD-10: E 11.65).  Marland Kitchen ENTRESTO 97-103 MG TAKE 1 TABLET BY MOUTH TWICE DAILY  . furosemide (LASIX) 40 MG tablet Take 1 tablet (40 mg total) by mouth daily.  Marland Kitchen glimepiride (AMARYL) 4 MG tablet Take 0.5 tablets (2 mg total) by mouth every morning. (Patient taking differently: Take 4 mg by mouth every morning. )  . ivabradine ([name]) 5 MG TABS tablet Take 1 tablet (5 mg total) by mouth 2 (two) times daily with a meal.  . JARDIANCE 25 MG TABS tablet Take 1 tablet by mouth daily.  . Lancets (ONETOUCH DELICA PLUS [id number]) MISC Use to test blood sugar daily  . metoprolol succinate (TOPROL-XL) 25 MG 24 hr tablet TAKE 1 AND  1/2 TABLETS BY MOUTH DAILY  . mupirocin ointment (BACTROBAN) 2 % Apply to affected area three times daily for 5 days  . ONE TOUCH ULTRA TEST test strip Use to test blood sugar daily  . rosuvastatin (CRESTOR) 10 MG tablet Take 1 tablet (10 mg total) by mouth daily.  Marland Kitchen spironolactone (ALDACTONE) 25 MG tablet Take 1 tablet (25 mg total) by mouth daily.   No current facility-administered medications for this visit. (Other)      REVIEW OF SYSTEMS:    ALLERGIES Allergies  Allergen Reactions  . Sulfur Rash  . Norvasc [Amlodipine Besylate] Rash  . Sulfa Antibiotics Rash    PAST MEDICAL HISTORY Past Medical History:  Diagnosis Date  . Arthritis    shoulder  . Cancer ([place])    skin cancer on nose  . Cardiomyopathy ([place])   . Chronic systolic CHF (congestive heart failure) ([facility])   . Diabetes mellitus without complication ([facility])   . Hyperlipidemia   . Hypertension    Past Surgical History:  Procedure Laterality Date  . AMPUTATION Right 10/14/2017   Procedure: AMPUTATION  GREAT TOE;  Surgeon: [name], MD;  Location: [facility];  Service: Orthopedics;  Laterality: Right;  . AMPUTATION Right 10/18/2017   Procedure:  RIGHT BELOW KNEE AMPUTATION;  Surgeon: [name], MD;  Location: [facility];  Service: Orthopedics;  Laterality: Right;  . CYSTOSCOPY WITH INSERTION OF UROLIFT N/A 03/15/2018   Procedure: CYSTOSCOPY WITH INSERTION OF UROLIFT;  Surgeon: Franchot Gallo, MD;  Location: [facility];  Service: Urology;  Laterality: N/A;  . SHOULDER SURGERY  01/05  . STUMP REVISION Right 12/15/2017   Procedure: REVISION RIGHT BELOW KNEE AMPUTATION;  Surgeon: [name], MD;  Location: [facility];  Service: Orthopedics;  Laterality: Right;    FAMILY HISTORY Family History  Problem Relation Age of Onset  . Pneumonia Mother        aspiration    SOCIAL HISTORY Social History   Tobacco Use  . Smoking status: Never Smoker  . Smokeless tobacco: Never Used  Vaping Use  . Vaping Use: Never used    Substance Use Topics  . Alcohol use: No    Alcohol/week: 0.0 standard drinks  . Drug use: Never         OPHTHALMIC EXAM:  Base Eye Exam    Visual Acuity (ETDRS)      Right Left   Dist cc 20/40 20/50 -2   Dist ph cc 20/25 +2 20/50 +1   Correction: Glasses       Tonometry (Tonopen, 9:00 AM)      Right Left   Pressure 06 09       Pupils      Pupils Dark Light Shape React APD   Right PERRL 4 3 Round Brisk None   Left PERRL 4 3 Round Brisk None       Visual Fields (Counting fingers)      Left Right    Full Full       Extraocular Movement      Right Left    Full Full       Neuro/Psych    Oriented x3: Yes   Mood/Affect: Normal       Dilation    Right eye: 1.0% Mydriacyl, 2.5% Phenylephrine @ 9:07 AM          IMAGING AND PROCEDURES  Imaging and Procedures for 04/22/20           ASSESSMENT/PLAN:  Severe nonproliferative diabetic retinopathy of right eye, with macular edema, associated with type 2 diabetes mellitus (HCC) CSME OD dramatically improved post multiinjections Eylea, will deliver PRP inferiorly on temporal today to decrease vegF burden      ICD-10-CM   1. Severe nonproliferative diabetic retinopathy of right eye, with macular edema, associated with type 2 diabetes mellitus (HCC)  M01.0272 Panretinal Photocoagulation - OD - Right Eye    1.  2.  3.  Ophthalmic Meds Ordered this visit:  No orders of the defined types were placed in this encounter.      Return for As scheduled, dilate, OS, EYLEA OCT.  Patient Instructions  Patient instructed to notify the office promptly if new onset visual acuity changes or declines    Explained the diagnoses, plan, and follow up with the patient and they expressed understanding.  Patient expressed understanding of the importance of proper follow up care.   Clent Demark Piero Mustard M.D. Diseases & Surgery of the Retina and Vitreous Retina & Diabetic [employer] 04/22/20     Abbreviations: M  myopia (nearsighted); A astigmatism; H hyperopia (farsighted); P presbyopia; Mrx spectacle prescription;  CTL contact lenses; OD right eye; OS left eye; OU both eyes  XT exotropia; ET esotropia; PEK punctate epithelial keratitis; PEE  punctate epithelial erosions; DES dry eye syndrome; MGD meibomian gland dysfunction; ATs artificial tears; PFAT's preservative free artificial tears; [place] nuclear sclerotic cataract; PSC posterior subcapsular cataract; ERM epi-retinal membrane; PVD posterior vitreous detachment; RD retinal detachment; DM diabetes mellitus; DR diabetic retinopathy; NPDR non-proliferative diabetic retinopathy; PDR proliferative diabetic retinopathy; CSME clinically significant macular edema; DME diabetic macular edema; dbh dot blot hemorrhages; CWS cotton wool spot; POAG primary open angle glaucoma; C/D cup-to-disc ratio; HVF humphrey visual field; GVF goldmann visual field; OCT optical coherence tomography; IOP intraocular pressure; BRVO Branch retinal vein occlusion; CRVO central retinal vein occlusion; CRAO central retinal artery occlusion; BRAO branch retinal artery occlusion; RT retinal tear; SB scleral buckle; PPV pars plana vitrectomy; VH Vitreous hemorrhage; PRP panretinal laser photocoagulation; IVK intravitreal kenalog; VMT vitreomacular traction; MH Macular hole;  NVD neovascularization of the disc; NVE neovascularization elsewhere; AREDS age related eye disease study; ARMD age related macular degeneration; POAG primary open angle glaucoma; EBMD epithelial/anterior basement membrane dystrophy; ACIOL anterior chamber intraocular lens; IOL intraocular lens; PCIOL posterior chamber intraocular lens; Phaco/IOL phacoemulsification with intraocular lens placement; [place] photorefractive keratectomy; LASIK laser assisted in situ keratomileusis; HTN hypertension; DM diabetes mellitus; COPD chronic obstructive pulmonary disease

## 2020-04-22 NOTE — Patient Instructions (Signed)
Patient instructed to notify the office promptly if new onset visual acuity changes or declines

## 2020-04-23 DIAGNOSIS — E1169 Type 2 diabetes mellitus with other specified complication: Secondary | ICD-10-CM | POA: Diagnosis not present

## 2020-04-29 DIAGNOSIS — E7800 Pure hypercholesterolemia, unspecified: Secondary | ICD-10-CM | POA: Diagnosis not present

## 2020-04-29 DIAGNOSIS — I10 Essential (primary) hypertension: Secondary | ICD-10-CM | POA: Diagnosis not present

## 2020-04-29 DIAGNOSIS — E1169 Type 2 diabetes mellitus with other specified complication: Secondary | ICD-10-CM | POA: Diagnosis not present

## 2020-05-20 DIAGNOSIS — E1169 Type 2 diabetes mellitus with other specified complication: Secondary | ICD-10-CM | POA: Diagnosis not present

## 2020-05-24 DIAGNOSIS — E1169 Type 2 diabetes mellitus with other specified complication: Secondary | ICD-10-CM | POA: Diagnosis not present

## 2020-05-25 ENCOUNTER — Other Ambulatory Visit: Payer: Self-pay | Admitting: Cardiology

## 2020-06-03 DIAGNOSIS — E7800 Pure hypercholesterolemia, unspecified: Secondary | ICD-10-CM | POA: Diagnosis not present

## 2020-06-03 DIAGNOSIS — E1169 Type 2 diabetes mellitus with other specified complication: Secondary | ICD-10-CM | POA: Diagnosis not present

## 2020-06-03 DIAGNOSIS — I10 Essential (primary) hypertension: Secondary | ICD-10-CM | POA: Diagnosis not present

## 2020-06-04 ENCOUNTER — Other Ambulatory Visit: Payer: Self-pay

## 2020-06-04 ENCOUNTER — Ambulatory Visit (INDEPENDENT_AMBULATORY_CARE_PROVIDER_SITE_OTHER): Payer: PPO | Admitting: Ophthalmology

## 2020-06-04 ENCOUNTER — Encounter (INDEPENDENT_AMBULATORY_CARE_PROVIDER_SITE_OTHER): Payer: Self-pay | Admitting: Ophthalmology

## 2020-06-04 DIAGNOSIS — E113412 Type 2 diabetes mellitus with severe nonproliferative diabetic retinopathy with macular edema, left eye: Secondary | ICD-10-CM

## 2020-06-04 MED ORDER — AFLIBERCEPT 2MG/0.05ML IZ SOLN FOR KALEIDOSCOPE
2.0000 mg | INTRAVITREAL | Status: AC | PRN
  Administered 2020-06-04: 2 mg via INTRAVITREAL

## 2020-06-04 NOTE — Progress Notes (Signed)
06/04/2020     CHIEF COMPLAINT Patient presents for Retina Follow Up   HISTORY OF PRESENT ILLNESS: Brian Herring is a 64 y.o. male who presents to the clinic today for:   HPI    Retina Follow Up    Patient presents with  Diabetic Retinopathy.  In left eye.  This started 8 weeks ago.  Severity is mild.  Duration of 8 weeks.  Since onset it is stable.          Comments    8 Week Diabetic F/U OS, poss Eylea OS  Pt denies noticeable changes to [place] OU since last visit. Pt denies ocular pain, flashes of light, or floaters OU.         Last edited by Rockie Neighbours, [place] on 06/04/2020 10:09 AM. (History)      Referring physician: [name], MD [place],  [place] [address]  HISTORICAL INFORMATION:   Selected notes from the MEDICAL RECORD NUMBER    Lab Results  Component Value Date   HGBA1C 7.0 (H) 03/12/2018     CURRENT MEDICATIONS: No current outpatient medications on file. (Ophthalmic Drugs)   No current facility-administered medications for this visit. (Ophthalmic Drugs)   Current Outpatient Medications (Other)  Medication Sig  . aspirin EC 81 MG tablet Take 81 mg by mouth daily.  . blood glucose meter kit and supplies KIT Dispense based on patient and insurance preference. Use up to four times daily as directed. (FOR ICD-10: E 11.65).  Marland Kitchen ENTRESTO 97-103 MG TAKE 1 TABLET BY MOUTH TWICE DAILY  . furosemide (LASIX) 40 MG tablet Take 1 tablet (40 mg total) by mouth daily.  Marland Kitchen glimepiride (AMARYL) 4 MG tablet Take 0.5 tablets (2 mg total) by mouth every morning. (Patient taking differently: Take 4 mg by mouth every morning. )  . ivabradine ([name]) 5 MG TABS tablet Take 1 tablet (5 mg total) by mouth 2 (two) times daily with a meal.  . JARDIANCE 25 MG TABS tablet Take 1 tablet by mouth daily.  . Lancets (ONETOUCH DELICA PLUS [id number]) MISC Use to test blood sugar daily  . metoprolol succinate (TOPROL-XL) 25 MG 24 hr tablet TAKE 1 AND 1/2  TABLETS BY MOUTH ONCE DAILY  . mupirocin ointment (BACTROBAN) 2 % Apply to affected area three times daily for 5 days  . ONE TOUCH ULTRA TEST test strip Use to test blood sugar daily  . rosuvastatin (CRESTOR) 10 MG tablet Take 1 tablet (10 mg total) by mouth daily.  Marland Kitchen spironolactone (ALDACTONE) 25 MG tablet Take 1 tablet (25 mg total) by mouth daily.   No current facility-administered medications for this visit. (Other)      REVIEW OF SYSTEMS:    ALLERGIES Allergies  Allergen Reactions  . Sulfur Rash  . Norvasc [Amlodipine Besylate] Rash  . Sulfa Antibiotics Rash    PAST MEDICAL HISTORY Past Medical History:  Diagnosis Date  . Arthritis    shoulder  . Cancer ([facility])    skin cancer on nose  . Cardiomyopathy ([facility])   . Chronic systolic CHF (congestive heart failure) ([facility])   . Diabetes mellitus without complication ([facility])   . Hyperlipidemia   . Hypertension    Past Surgical History:  Procedure Laterality Date  . AMPUTATION Right 10/14/2017   Procedure: AMPUTATION  GREAT TOE;  Surgeon: [name], MD;  Location: [facility];  Service: Orthopedics;  Laterality: Right;  . AMPUTATION Right 10/18/2017   Procedure: RIGHT BELOW KNEE  AMPUTATION;  Surgeon: [name], MD;  Location: [facility];  Service: Orthopedics;  Laterality: Right;  . CYSTOSCOPY WITH INSERTION OF UROLIFT N/A 03/15/2018   Procedure: CYSTOSCOPY WITH INSERTION OF UROLIFT;  Surgeon: Franchot Gallo, MD;  [facility];  Service: Urology;  Laterality: N/A;  . SHOULDER SURGERY  01/05  . STUMP REVISION Right 12/15/2017   Procedure: REVISION RIGHT BELOW KNEE AMPUTATION;  Surgeon: [name], MD;  Location: [facility];  Service: Orthopedics;  Laterality: Right;    FAMILY HISTORY Family History  Problem Relation Age of Onset  . Pneumonia Mother        aspiration    SOCIAL HISTORY Social History   Tobacco Use  . Smoking status: Never Smoker  . Smokeless tobacco: Never Used  Vaping Use  . Vaping Use: Never used    Substance Use Topics  . Alcohol use: No    Alcohol/week: 0.0 standard drinks  . Drug use: Never         OPHTHALMIC EXAM: Base Eye Exam    Visual Acuity (ETDRS)      Right Left   Dist cc 20/50 +1 20/70 +2   Dist ph cc 20/40 +1 20/50 +2   Correction: Glasses       Tonometry (Tonopen, 10:09 AM)      Right Left   Pressure 11 13       Pupils      Pupils Dark Light Shape React APD   Right PERRL 4 3 Round Brisk None   Left PERRL 4 3 Round Brisk None       Visual Fields (Counting fingers)      Left Right    Full Full       Extraocular Movement      Right Left    Full Full       Neuro/Psych    Oriented x3: Yes   Mood/Affect: Normal       Dilation    Left eye: 1.0% Mydriacyl, 2.5% Phenylephrine @ 10:18 AM        Slit Lamp and Fundus Exam    External Exam      Right Left   External Normal Normal       Slit Lamp Exam      Right Left   Lids/Lashes Normal Normal   Conjunctiva/Sclera White and quiet White and quiet   Cornea Clear Clear   Anterior Chamber Deep and quiet Deep and quiet   [place]ris Round and reactive Round and reactive   Lens 2+ Nuclear sclerosis 2+ Nuclear sclerosis   Anterior Vitreous Normal Normal       Fundus Exam      Right Left   Posterior Vitreous  Normal   Disc  Normal   C/D Ratio  0.35   Macula  Clinically significant macular edema, Mild clinically significant macular edema   Vessels  Severe NPDR   Periphery  Moderate scatter pattern laser, peripherally, room temporally for more laser          IMAGING AND PROCEDURES  Imaging and Procedures for 06/04/20  OCT, Retina - OU - Both Eyes       Right Eye Quality was good. Scan locations included subfoveal. Central Foveal Thickness: 324. Progression has improved. Findings include abnormal foveal contour.   Left Eye Quality was good. Scan locations included subfoveal. Central Foveal Thickness: 367. Progression has improved. Findings include abnormal foveal contour.   Notes CSME  OU, overall vastly improved,  Currently OS at  8-week follow-up post Eylea injection, will repeat injection today and examination again in 10 weeks left eye       Intravitreal Injection, Pharmacologic Agent - OS - Left Eye       Time Out 06/04/2020. 11:07 AM. Confirmed correct patient, procedure, site, and patient consented.   Anesthesia Topical anesthesia was used. Anesthetic medications included [place] 3.5%.   Procedure Preparation included 10% betadine to eyelids, 5% betadine to ocular surface, Tobramycin 0.3%. A 30 gauge needle was used.   Injection:  2 mg aflibercept Alfonse Flavors) SOLN   NDC: [id number], Lot: [id number]   Route: Intravitreal, Site: Left Eye, Waste: 0 mg  Post-op Post injection exam found visual acuity of at least counting fingers. The patient tolerated the procedure well. There were no complications. The patient received written and verbal post procedure care education. Post injection medications were not given.                 ASSESSMENT/PLAN:  Severe nonproliferative diabetic retinopathy of left eye, with macular edema, associated with type 2 diabetes mellitus (HCC) CSME OU, overall vastly improved,  Currently OS at 8-week follow-up post Eylea injection, will repeat injection today and examination again in 10 weeks left eye      ICD-10-CM   1. Severe nonproliferative diabetic retinopathy of left eye, with macular edema, associated with type 2 diabetes mellitus (HCC)  S06.3016 OCT, Retina - OU - Both Eyes    Intravitreal Injection, Pharmacologic Agent - OS - Left Eye    aflibercept (EYLEA) SOLN 2 mg    1  repeat intravitreal Eylea OS today and in the eye with previous proven resistance to Avastin.  Currently at 8-week follow-up interval.  We will repeat injection today and examination OS in 10 weeks  2..Follow-up OD as scheduled  3.  Ophthalmic Meds Ordered this visit:  Meds ordered this encounter  Medications  . aflibercept (EYLEA) SOLN 2 mg        Return in about 10 weeks (around 08/13/2020) for dilate, OS, EYLEA OCT.  There are no Patient Instructions on file for this visit.   Explained the diagnoses, plan, and follow up with the patient and they expressed understanding.  Patient expressed understanding of the importance of proper follow up care.   Clent Demark Sayre Mazor M.D. Diseases & Surgery of the Retina and Vitreous Retina & Diabetic [place] 06/04/20     Abbreviations: M myopia (nearsighted); A astigmatism; H hyperopia (farsighted); P presbyopia; Mrx spectacle prescription;  CTL contact lenses; OD right eye; OS left eye; OU both eyes  XT exotropia; ET esotropia; PEK punctate epithelial keratitis; PEE punctate epithelial erosions; DES dry eye syndrome; MGD meibomian gland dysfunction; ATs artificial tears; PFAT's preservative free artificial tears; [place] nuclear sclerotic cataract; PSC posterior subcapsular cataract; ERM epi-retinal membrane; PVD posterior vitreous detachment; RD retinal detachment; DM diabetes mellitus; DR diabetic retinopathy; NPDR non-proliferative diabetic retinopathy; PDR proliferative diabetic retinopathy; CSME clinically significant macular edema; DME diabetic macular edema; dbh dot blot hemorrhages; CWS cotton wool spot; POAG primary open angle glaucoma; C/D cup-to-disc ratio; HVF humphrey visual field; GVF goldmann visual field; OCT optical coherence tomography; IOP intraocular pressure; BRVO Branch retinal vein occlusion; CRVO central retinal vein occlusion; CRAO central retinal artery occlusion; BRAO branch retinal artery occlusion; RT retinal tear; SB scleral buckle; PPV pars plana vitrectomy; VH Vitreous hemorrhage; PRP panretinal laser photocoagulation; IVK intravitreal kenalog; VMT vitreomacular traction; MH Macular hole;  NVD neovascularization of the disc; NVE neovascularization elsewhere; AREDS age related eye disease  study; ARMD age related macular degeneration; POAG primary open angle glaucoma; EBMD  epithelial/anterior basement membrane dystrophy; ACIOL anterior chamber intraocular lens; IOL intraocular lens; PCIOL posterior chamber intraocular lens; Phaco/IOL phacoemulsification with intraocular lens placement; [place] photorefractive keratectomy; LASIK laser assisted in situ keratomileusis; HTN hypertension; DM diabetes mellitus; COPD chronic obstructive pulmonary disease

## 2020-06-04 NOTE — Assessment & Plan Note (Signed)
CSME OU, overall vastly improved,  Currently OS at 8-week follow-up post Eylea injection, will repeat injection today and examination again in 10 weeks left eye

## 2020-06-23 DIAGNOSIS — E1169 Type 2 diabetes mellitus with other specified complication: Secondary | ICD-10-CM | POA: Diagnosis not present

## 2020-06-29 DIAGNOSIS — R972 Elevated prostate specific antigen [PSA]: Secondary | ICD-10-CM | POA: Diagnosis not present

## 2020-06-29 DIAGNOSIS — E1169 Type 2 diabetes mellitus with other specified complication: Secondary | ICD-10-CM | POA: Diagnosis not present

## 2020-06-29 DIAGNOSIS — Z23 Encounter for immunization: Secondary | ICD-10-CM | POA: Diagnosis not present

## 2020-07-02 ENCOUNTER — Other Ambulatory Visit: Payer: Self-pay

## 2020-07-02 ENCOUNTER — Encounter (INDEPENDENT_AMBULATORY_CARE_PROVIDER_SITE_OTHER): Payer: Self-pay | Admitting: Ophthalmology

## 2020-07-02 ENCOUNTER — Ambulatory Visit (INDEPENDENT_AMBULATORY_CARE_PROVIDER_SITE_OTHER): Payer: PPO | Admitting: Ophthalmology

## 2020-07-02 DIAGNOSIS — H2511 Age-related nuclear cataract, right eye: Secondary | ICD-10-CM | POA: Diagnosis not present

## 2020-07-02 DIAGNOSIS — E113412 Type 2 diabetes mellitus with severe nonproliferative diabetic retinopathy with macular edema, left eye: Secondary | ICD-10-CM | POA: Diagnosis not present

## 2020-07-02 DIAGNOSIS — I10 Essential (primary) hypertension: Secondary | ICD-10-CM | POA: Diagnosis not present

## 2020-07-02 DIAGNOSIS — E113411 Type 2 diabetes mellitus with severe nonproliferative diabetic retinopathy with macular edema, right eye: Secondary | ICD-10-CM | POA: Diagnosis not present

## 2020-07-02 DIAGNOSIS — E1169 Type 2 diabetes mellitus with other specified complication: Secondary | ICD-10-CM | POA: Diagnosis not present

## 2020-07-02 DIAGNOSIS — E7800 Pure hypercholesterolemia, unspecified: Secondary | ICD-10-CM | POA: Diagnosis not present

## 2020-07-02 MED ORDER — AFLIBERCEPT 2MG/0.05ML IZ SOLN FOR KALEIDOSCOPE
2.0000 mg | INTRAVITREAL | Status: AC | PRN
  Administered 2020-07-02: 2 mg via INTRAVITREAL

## 2020-07-02 NOTE — Assessment & Plan Note (Signed)
Follow-up OS as scheduled 

## 2020-07-02 NOTE — Assessment & Plan Note (Signed)
Vastly improved CSME OD, now at 16-month follow-up interval for possible injection.  This extension of duration is combination of therapy as well as moderate scatter PRP delivered in the past to decrease vegF burden

## 2020-07-02 NOTE — Progress Notes (Signed)
07/02/2020     CHIEF COMPLAINT Patient presents for Retina Follow Up (12 WK F/U OD, POSS EYLEA OD, since last injection OD///Pt reports stable vision, no new f/f, no pain or pressure.////A1C: 8.1 06/29/20///Last BS: 123 this AM)   HISTORY OF PRESENT ILLNESS: Brian Herring is a 64 y.o. male who presents to the clinic today for:   HPI    Retina Follow Up    Patient presents with  Diabetic Retinopathy.  In right eye.  This started 1.5 years ago.  Severity is mild.  Duration of weeks.  Since onset it is gradually improving.  I, the attending physician,  performed the HPI with the patient and updated documentation appropriately. Additional comments: 75 WK F/U OD, POSS EYLEA OD, since last injection OD   Pt reports stable vision, no new f/f, no pain or pressure.    A1C: 8.1 06/29/20   Last BS: 123 this AM       Last edited by Hurman Horn, MD on 07/02/2020 10:12 AM. (History)      Referring physician: [name], MD [place],  [place] [address]  HISTORICAL INFORMATION:   Selected notes from the MEDICAL RECORD NUMBER    Lab Results  Component Value Date   HGBA1C 7.0 (H) 03/12/2018     CURRENT MEDICATIONS: No current outpatient medications on file. (Ophthalmic Drugs)   No current facility-administered medications for this visit. (Ophthalmic Drugs)   Current Outpatient Medications (Other)  Medication Sig  . aspirin EC 81 MG tablet Take 81 mg by mouth daily.  . blood glucose meter kit and supplies KIT Dispense based on patient and insurance preference. Use up to four times daily as directed. (FOR ICD-10: E 11.65).  Marland Kitchen ENTRESTO 97-103 MG TAKE 1 TABLET BY MOUTH TWICE DAILY  . furosemide (LASIX) 40 MG tablet Take 1 tablet (40 mg total) by mouth daily.  Marland Kitchen glimepiride (AMARYL) 4 MG tablet Take 0.5 tablets (2 mg total) by mouth every morning. (Patient taking differently: Take 4 mg by mouth every morning. )  . ivabradine ([name]) 5 MG TABS tablet Take 1  tablet (5 mg total) by mouth 2 (two) times daily with a meal.  . JARDIANCE 25 MG TABS tablet Take 1 tablet by mouth daily.  . Lancets (ONETOUCH DELICA PLUS [id number]) MISC Use to test blood sugar daily  . metoprolol succinate (TOPROL-XL) 25 MG 24 hr tablet TAKE 1 AND 1/2 TABLETS BY MOUTH ONCE DAILY  . mupirocin ointment (BACTROBAN) 2 % Apply to affected area three times daily for 5 days  . ONE TOUCH ULTRA TEST test strip Use to test blood sugar daily  . rosuvastatin (CRESTOR) 10 MG tablet Take 1 tablet (10 mg total) by mouth daily.  Marland Kitchen spironolactone (ALDACTONE) 25 MG tablet Take 1 tablet (25 mg total) by mouth daily.   No current facility-administered medications for this visit. (Other)      REVIEW OF SYSTEMS:    ALLERGIES Allergies  Allergen Reactions  . Sulfur Rash  . Norvasc [Amlodipine Besylate] Rash  . Sulfa Antibiotics Rash    PAST MEDICAL HISTORY Past Medical History:  Diagnosis Date  . Arthritis    shoulder  . Cancer ([place])    skin cancer on nose  . Cardiomyopathy ([place])   . Chronic systolic CHF (congestive heart failure) ([place])   . Diabetes mellitus without complication ([place])   . Hyperlipidemia   . Hypertension    Past Surgical History:  Procedure Laterality Date  .  AMPUTATION Right 10/14/2017   Procedure: AMPUTATION  GREAT TOE;  Surgeon: [name], MD;  Location: [facility];  Service: Orthopedics;  Laterality: Right;  . AMPUTATION Right 10/18/2017   Procedure: RIGHT BELOW KNEE AMPUTATION;  Surgeon: [name], MD;  Location: [facility];  Service: Orthopedics;  Laterality: Right;  . CYSTOSCOPY WITH INSERTION OF UROLIFT N/A 03/15/2018   Procedure: CYSTOSCOPY WITH INSERTION OF UROLIFT;  Surgeon: Franchot Gallo, MD;  Location: [facility];  Service: Urology;  Laterality: N/A;  . SHOULDER SURGERY  01/05  . STUMP REVISION Right 12/15/2017   Procedure: REVISION RIGHT BELOW KNEE AMPUTATION;  Surgeon: [name], MD;  Location: [facility];  Service: Orthopedics;  Laterality:  Right;    FAMILY HISTORY Family History  Problem Relation Age of Onset  . Pneumonia Mother        aspiration    SOCIAL HISTORY Social History   Tobacco Use  . Smoking status: Never Smoker  . Smokeless tobacco: Never Used  Vaping Use  . Vaping Use: Never used  Substance Use Topics  . Alcohol use: No    Alcohol/week: 0.0 standard drinks  . Drug use: Never         OPHTHALMIC EXAM: Base Eye Exam    Visual Acuity (ETDRS)      Right Left   Dist cc 20/50 +1 20/60 +2   Dist ph cc 20/30 -3 20/40 -3   Correction: Glasses       Tonometry (Tonopen, 9:41 AM)      Right Left   Pressure 7 9       Pupils      Pupils Dark Light Shape React APD   Right PERRL 4 3 Round Brisk None   Left PERRL 4 3 Round Brisk None       Visual Fields (Counting fingers)      Left Right    Full Full       Extraocular Movement      Right Left    Full Full       Neuro/Psych    Oriented x3: Yes   Mood/Affect: Normal       Dilation    Right eye: 1.0% Mydriacyl, 2.5% Phenylephrine @ 9:41 AM        Slit Lamp and Fundus Exam    External Exam      Right Left   External Normal Normal       Slit Lamp Exam      Right Left   Lids/Lashes Normal Normal   Conjunctiva/Sclera White and quiet White and quiet   Cornea Clear Clear   Anterior Chamber Deep and quiet Deep and quiet   Iris Round and reactive Round and reactive   Lens 2+ Nuclear sclerosis, Cortical spokes 2+ 2+ Nuclear sclerosis   Anterior Vitreous Normal Normal       Fundus Exam      Right Left   Posterior Vitreous Posterior vitreous detachment    Disc Normal    C/D Ratio 0.2    Macula Clinically significant macular edema, Mild clinically significant macular edema, Hemorrhage, Microaneurysms, Macular thickening    Vessels Severe nonproliferative diabetic retinopathy    Periphery Normal, moderate scatter PRP nasal.  Room temporally and superiorly for additional laser           IMAGING AND PROCEDURES  Imaging and  Procedures for 07/02/20  OCT, Retina - OU - Both Eyes       Right Eye Quality was good. Scan  locations included subfoveal. Progression has improved. Findings include cystoid macular edema.   Notes Center involved CSME yet vastly improved overall today currently at 3 months after most recent injection Eylea OD       Intravitreal Injection, Pharmacologic Agent - OD - Right Eye       Time Out 07/02/2020. 10:13 AM. Confirmed correct patient, procedure, site, and patient consented.   Anesthesia Topical anesthesia was used. Anesthetic medications included Akten 3.5%.   Procedure Preparation included 10% betadine to eyelids, 5% betadine to ocular surface, Tobramycin 0.3%, Ofloxacin . A 30 gauge needle was used.   Injection:  2 mg aflibercept Alfonse Flavors) SOLN   NDC: [id number], Lot: [id number]   Route: Intravitreal, Site: Right Eye, Waste: 0 mg  Post-op Post injection exam found visual acuity of at least counting fingers. The patient tolerated the procedure well. There were no complications. The patient received written and verbal post procedure care education. Post injection medications were not given.                 ASSESSMENT/PLAN:  Nuclear sclerotic cataract of right eye Mild, not visually significant cataract OD  Severe nonproliferative diabetic retinopathy of right eye, with macular edema, associated with type 2 diabetes mellitus ([place]) Vastly improved CSME OD, now at 61-monthfollow-up interval for possible injection.  This extension of duration is combination of therapy as well as moderate scatter PRP delivered in the past to decrease vegF burden      ICD-10-CM   1. Severe nonproliferative diabetic retinopathy of right eye, with macular edema, associated with type 2 diabetes mellitus (HCC)  E11.3411 OCT, Retina - OU - Both Eyes    Intravitreal Injection, Pharmacologic Agent - OD - Right Eye    aflibercept (EYLEA) SOLN 2 mg  2. Nuclear sclerotic cataract of right  eye  H25.11     1.  CSME OD vastly improved with improved acuity as well.  Last injection OD was 3 months prior, will repeat injection today and follow-up again OD in 3 months  Overall the right eye is improved as a combination of intravitreal Eylea as well as early moderate scatter PRP to decrease vegF burden release from peripheral retinal ischemia  2.  OS follow-up as scheduled.  3.  Ophthalmic Meds Ordered this visit:  Meds ordered this encounter  Medications  . aflibercept (EYLEA) SOLN 2 mg       Return in about 3 months (around 09/30/2020) for dilate, OD, EYLEA OCT.  There are no Patient Instructions on file for this visit.   Explained the diagnoses, plan, and follow up with the patient and they expressed understanding.  Patient expressed understanding of the importance of proper follow up care.   GClent DemarkRankin M.D. Diseases & Surgery of the Retina and Vitreous Retina & Diabetic [place]12/09/21     Abbreviations: M myopia (nearsighted); A astigmatism; H hyperopia (farsighted); P presbyopia; Mrx spectacle prescription;  CTL contact lenses; OD right eye; OS left eye; OU both eyes  XT exotropia; ET esotropia; PEK punctate epithelial keratitis; PEE punctate epithelial erosions; DES dry eye syndrome; MGD meibomian gland dysfunction; ATs artificial tears; PFAT's preservative free artificial tears; NPorternuclear sclerotic cataract; PSC posterior subcapsular cataract; ERM epi-retinal membrane; PVD posterior vitreous detachment; RD retinal detachment; DM diabetes mellitus; DR diabetic retinopathy; NPDR non-proliferative diabetic retinopathy; PDR proliferative diabetic retinopathy; CSME clinically significant macular edema; DME diabetic macular edema; dbh dot blot hemorrhages; CWS cotton wool spot; POAG primary open angle glaucoma; C/D cup-to-disc  ratio; HVF humphrey visual field; GVF goldmann visual field; OCT optical coherence tomography; IOP intraocular pressure; BRVO Branch  retinal vein occlusion; CRVO central retinal vein occlusion; CRAO central retinal artery occlusion; BRAO branch retinal artery occlusion; RT retinal tear; SB scleral buckle; PPV pars plana vitrectomy; VH Vitreous hemorrhage; PRP panretinal laser photocoagulation; IVK intravitreal kenalog; VMT vitreomacular traction; MH Macular hole;  NVD neovascularization of the disc; NVE neovascularization elsewhere; AREDS age related eye disease study; ARMD age related macular degeneration; POAG primary open angle glaucoma; EBMD epithelial/anterior basement membrane dystrophy; ACIOL anterior chamber intraocular lens; IOL intraocular lens; PCIOL posterior chamber intraocular lens; Phaco/IOL phacoemulsification with intraocular lens placement; [place] photorefractive keratectomy; LASIK laser assisted in situ keratomileusis; HTN hypertension; DM diabetes mellitus; COPD chronic obstructive pulmonary disease

## 2020-07-02 NOTE — Assessment & Plan Note (Signed)
Mild, not visually significant cataract OD

## 2020-07-23 DIAGNOSIS — E1169 Type 2 diabetes mellitus with other specified complication: Secondary | ICD-10-CM | POA: Diagnosis not present

## 2020-08-06 ENCOUNTER — Other Ambulatory Visit: Payer: Self-pay

## 2020-08-06 ENCOUNTER — Telehealth: Payer: Self-pay

## 2020-08-06 MED ORDER — ENTRESTO 97-103 MG PO TABS
1.0000 | ORAL_TABLET | Freq: Two times a day (BID) | ORAL | 3 refills | Status: DC
Start: 2020-08-06 — End: 2021-08-31

## 2020-08-06 MED ORDER — IVABRADINE HCL 5 MG PO TABS
5.0000 mg | ORAL_TABLET | Freq: Two times a day (BID) | ORAL | 1 refills | Status: DC
Start: 2020-08-06 — End: 2021-09-20

## 2020-08-06 NOTE — Telephone Encounter (Signed)
Patient's entresto and [place] patient assistance has been faxed today

## 2020-08-07 DIAGNOSIS — E7800 Pure hypercholesterolemia, unspecified: Secondary | ICD-10-CM | POA: Diagnosis not present

## 2020-08-07 DIAGNOSIS — E1169 Type 2 diabetes mellitus with other specified complication: Secondary | ICD-10-CM | POA: Diagnosis not present

## 2020-08-07 DIAGNOSIS — I10 Essential (primary) hypertension: Secondary | ICD-10-CM | POA: Diagnosis not present

## 2020-08-13 ENCOUNTER — Other Ambulatory Visit: Payer: Self-pay

## 2020-08-13 ENCOUNTER — Encounter (INDEPENDENT_AMBULATORY_CARE_PROVIDER_SITE_OTHER): Payer: Self-pay | Admitting: Ophthalmology

## 2020-08-13 ENCOUNTER — Ambulatory Visit (INDEPENDENT_AMBULATORY_CARE_PROVIDER_SITE_OTHER): Payer: PPO | Admitting: Ophthalmology

## 2020-08-13 DIAGNOSIS — E113412 Type 2 diabetes mellitus with severe nonproliferative diabetic retinopathy with macular edema, left eye: Secondary | ICD-10-CM | POA: Diagnosis not present

## 2020-08-13 DIAGNOSIS — H2512 Age-related nuclear cataract, left eye: Secondary | ICD-10-CM | POA: Diagnosis not present

## 2020-08-13 DIAGNOSIS — H2511 Age-related nuclear cataract, right eye: Secondary | ICD-10-CM | POA: Diagnosis not present

## 2020-08-13 MED ORDER — AFLIBERCEPT 2MG/0.05ML IZ SOLN FOR KALEIDOSCOPE
2.0000 mg | INTRAVITREAL | Status: AC | PRN
  Administered 2020-08-13: 2 mg via INTRAVITREAL

## 2020-08-13 NOTE — Assessment & Plan Note (Signed)
Okay to proceed with cataract traction with intraocular placement OU at any time Cataract(s) account for the patient's complaint. I discussed the risks and benefits of cataract surgery. Options were explained to the patient. The patient understands that new glasses may not improve their vision and desires to have cataract surgery. I have recommended follow up with their general eye care doctor for evaluation and consideration of cataract extraction with new intraocular lens insertion.

## 2020-08-13 NOTE — Assessment & Plan Note (Signed)
Repeat Eylea injection OS today, currently at 10-week follow-up.  We will repeat evaluation and again in 10 weeks

## 2020-08-13 NOTE — Progress Notes (Signed)
08/13/2020     CHIEF COMPLAINT Patient presents for Retina Follow Up (10 Week F/U OS, poss Eylea OS//Pt c/o worsening cataracts. Pt sts things are not as bright as before OU. Pt sts he is still able to see street signs. //LBS: 93 last night)   HISTORY OF PRESENT ILLNESS: Brian Herring is a 65 y.o. male who presents to the clinic today for:   HPI    Retina Follow Up    Patient presents with  Diabetic Retinopathy.  In left eye.  This started 10 weeks ago.  Severity is mild.  Duration of 10 weeks.  Since onset it is stable. Additional comments: 10 Week F/U OS, poss Eylea OS  Pt c/o worsening cataracts. Pt sts things are not as bright as before OU. Pt sts he is still able to see street signs.   LBS: 93 last night       Last edited by Rockie Neighbours, [place] on 08/13/2020  9:49 AM. (History)      Referring physician: [name], MD [place],  [place] [address]  HISTORICAL INFORMATION:   Selected notes from the MEDICAL RECORD NUMBER    Lab Results  Component Value Date   HGBA1C 7.0 (H) 03/12/2018     CURRENT MEDICATIONS: No current outpatient medications on file. (Ophthalmic Drugs)   No current facility-administered medications for this visit. (Ophthalmic Drugs)   Current Outpatient Medications (Other)  Medication Sig  . aspirin EC 81 MG tablet Take 81 mg by mouth daily.  . blood glucose meter kit and supplies KIT Dispense based on patient and insurance preference. Use up to four times daily as directed. (FOR ICD-10: E 11.65).  . furosemide (LASIX) 40 MG tablet Take 1 tablet (40 mg total) by mouth daily.  Marland Kitchen glimepiride (AMARYL) 4 MG tablet Take 0.5 tablets (2 mg total) by mouth every morning. (Patient taking differently: Take 4 mg by mouth every morning. )  . ivabradine ([name]) 5 MG TABS tablet Take 1 tablet (5 mg total) by mouth 2 (two) times daily with a meal.  . JARDIANCE 25 MG TABS tablet Take 1 tablet by mouth daily.  . Lancets (ONETOUCH  DELICA PLUS [id number]) MISC Use to test blood sugar daily  . metoprolol succinate (TOPROL-XL) 25 MG 24 hr tablet TAKE 1 AND 1/2 TABLETS BY MOUTH ONCE DAILY  . mupirocin ointment (BACTROBAN) 2 % Apply to affected area three times daily for 5 days  . ONE TOUCH ULTRA TEST test strip Use to test blood sugar daily  . rosuvastatin (CRESTOR) 10 MG tablet Take 1 tablet (10 mg total) by mouth daily.  . sacubitril-valsartan (ENTRESTO) 97-103 MG Take 1 tablet by mouth 2 (two) times daily.  Marland Kitchen spironolactone (ALDACTONE) 25 MG tablet Take 1 tablet (25 mg total) by mouth daily.   No current facility-administered medications for this visit. (Other)      REVIEW OF SYSTEMS:    ALLERGIES Allergies  Allergen Reactions  . Elemental Sulfur Rash  . Norvasc [Amlodipine Besylate] Rash  . Sulfa Antibiotics Rash    PAST MEDICAL HISTORY Past Medical History:  Diagnosis Date  . Arthritis    shoulder  . Cancer ([facility])    skin cancer on nose  . Cardiomyopathy ([facility])   . Chronic systolic CHF (congestive heart failure) ([place])   . Diabetes mellitus without complication ([place])   . Hyperlipidemia   . Hypertension    Past Surgical History:  Procedure Laterality Date  . AMPUTATION Right  10/14/2017   Procedure: AMPUTATION  GREAT TOE;  Surgeon: [name], MD;  Location: [facility];  Service: Orthopedics;  Laterality: Right;  . AMPUTATION Right 10/18/2017   Procedure: RIGHT BELOW KNEE AMPUTATION;  Surgeon: [name], MD;  Location: [facility];  Service: Orthopedics;  Laterality: Right;  . CYSTOSCOPY WITH INSERTION OF UROLIFT N/A 03/15/2018   Procedure: CYSTOSCOPY WITH INSERTION OF UROLIFT;  Surgeon: Franchot Gallo, MD;  [facility];  Service: Urology;  Laterality: N/A;  . SHOULDER SURGERY  01/05  . STUMP REVISION Right 12/15/2017   Procedure: REVISION RIGHT BELOW KNEE AMPUTATION;  Surgeon: [name], MD;  Location: [facility];  Service: Orthopedics;  Laterality: Right;    FAMILY HISTORY Family History   Problem Relation Age of Onset  . Pneumonia Mother        aspiration    SOCIAL HISTORY Social History   Tobacco Use  . Smoking status: Never Smoker  . Smokeless tobacco: Never Used  Vaping Use  . Vaping Use: Never used  Substance Use Topics  . Alcohol use: No    Alcohol/week: 0.0 standard drinks  . Drug use: Never         OPHTHALMIC EXAM: Base Eye Exam    Visual Acuity (ETDRS)      Right Left   Dist cc 20/40 +2 20/60   Dist ph cc 20/25 +2 20/50 +2   Correction: Glasses       Tonometry (Tonopen, 9:49 AM)      Right Left   Pressure 10 14       Pupils      Pupils Dark Light Shape React APD   Right PERRL 4 3 Round Brisk None   Left PERRL 4 3 Round Brisk None       Visual Fields (Counting fingers)      Left Right    Full Full       Extraocular Movement      Right Left    Full Full       Neuro/Psych    Oriented x3: Yes   Mood/Affect: Normal       Dilation    Left eye: 1.0% Mydriacyl, 2.5% Phenylephrine @ 9:59 AM        Slit Lamp and Fundus Exam    External Exam      Right Left   External Normal Normal       Slit Lamp Exam      Right Left   Lids/Lashes Normal Normal   Conjunctiva/Sclera White and quiet White and quiet   Cornea Clear Clear   Anterior Chamber Deep and quiet Deep and quiet   Iris Round and reactive Round and reactive   Lens 2+ Nuclear sclerosis, Cortical spokes 2+ 2+ Nuclear sclerosis   Anterior Vitreous Normal Normal       Fundus Exam      Right Left   Posterior Vitreous  Normal   Disc  Normal   C/D Ratio  0.35   Macula  Clinically significant macular edema, Mild clinically significant macular edema   Vessels  Severe NPDR   Periphery  Moderate scatter pattern laser, peripherally, room temporally for more laser          IMAGING AND PROCEDURES  Imaging and Procedures for 08/13/20  OCT, Retina - OU - Both Eyes       Right Eye Quality was borderline. Scan locations included subfoveal. Progression has been stable.  Findings include abnormal foveal contour, cystoid macular  edema.   Left Eye Scan locations included subfoveal. Central Foveal Thickness: 352. Progression has been stable. Findings include abnormal foveal contour, cystoid macular edema.   Notes Chronic CSME CME OU, overall vastly improved as compared to onset  And stable left eye today at 10-week follow-up.  Repeat intravitreal Eylea OS today       Intravitreal Injection, Pharmacologic Agent - OS - Left Eye       Time Out 08/13/2020. 10:55 AM. Confirmed correct patient, procedure, site, and patient consented.   Anesthesia Topical anesthesia was used. Anesthetic medications included Akten 3.5%.   Procedure Preparation included 10% betadine to eyelids, 5% betadine to ocular surface, Tobramycin 0.3%. A 30 gauge needle was used.   Injection:  2 mg aflibercept Alfonse Flavors) SOLN   NDC: [id number], Lot: [id number]   Route: Intravitreal, Site: Left Eye, Waste: 0 mg  Post-op Post injection exam found visual acuity of at least counting fingers. The patient tolerated the procedure well. There were no complications. The patient received written and verbal post procedure care education. Post injection medications were not given.                 ASSESSMENT/PLAN:  Nuclear sclerotic cataract of left eye Okay to proceed with cataract traction with intraocular placement OU at any time Cataract(s) account for the patient's complaint. I discussed the risks and benefits of cataract surgery. Options were explained to the patient. The patient understands that new glasses may not improve their vision and desires to have cataract surgery. I have recommended follow up with their general eye care doctor for evaluation and consideration of cataract extraction with new intraocular lens insertion.  Nuclear sclerotic cataract of right eye Okay to proceed with cataract traction with intraocular placement OU at any time Cataract(s) account for the  patient's complaint. I discussed the risks and benefits of cataract surgery. Options were explained to the patient. The patient understands that new glasses may not improve their vision and desires to have cataract surgery. I have recommended follow up with their general eye care doctor for evaluation and consideration of cataract extraction with new intraocular lens insertion.  Severe nonproliferative diabetic retinopathy of left eye, with macular edema, associated with type 2 diabetes mellitus ([place]) Repeat Eylea injection OS today, currently at 10-week follow-up.  We will repeat evaluation and again in 10 weeks      ICD-10-CM   1. Severe nonproliferative diabetic retinopathy of left eye, with macular edema, associated with type 2 diabetes mellitus (HCC)  K09.3818 OCT, Retina - OU - Both Eyes    Intravitreal Injection, Pharmacologic Agent - OS - Left Eye    aflibercept (EYLEA) SOLN 2 mg  2. Nuclear sclerotic cataract of left eye  H25.12   3. Nuclear sclerotic cataract of right eye  H25.11     1.  Repeat injection intravitreal Eylea OS today, examination again left eye in 10 weeks  2.  Follow up next OD as scheduled  3.  Ophthalmic Meds Ordered this visit:  Meds ordered this encounter  Medications  . aflibercept (EYLEA) SOLN 2 mg       Return in about 10 weeks (around 10/22/2020) for dilate, OS, EYLEA OCT,,,,, and OD as scheduled next visit.  There are no Patient Instructions on file for this visit.   Explained the diagnoses, plan, and follow up with the patient and they expressed understanding.  Patient expressed understanding of the importance of proper follow up care.   Clent Demark Kamden Stanislaw M.D. Diseases &  Surgery of the Retina and Vitreous Retina & Diabetic [place] 08/13/20     Abbreviations: M myopia (nearsighted); A astigmatism; H hyperopia (farsighted); P presbyopia; Mrx spectacle prescription;  CTL contact lenses; OD right eye; OS left eye; OU both eyes  XT exotropia;  ET esotropia; PEK punctate epithelial keratitis; PEE punctate epithelial erosions; DES dry eye syndrome; MGD meibomian gland dysfunction; ATs artificial tears; PFAT's preservative free artificial tears; [place] nuclear sclerotic cataract; PSC posterior subcapsular cataract; ERM epi-retinal membrane; PVD posterior vitreous detachment; RD retinal detachment; DM diabetes mellitus; DR diabetic retinopathy; NPDR non-proliferative diabetic retinopathy; PDR proliferative diabetic retinopathy; CSME clinically significant macular edema; DME diabetic macular edema; dbh dot blot hemorrhages; CWS cotton wool spot; POAG primary open angle glaucoma; C/D cup-to-disc ratio; HVF humphrey visual field; GVF goldmann visual field; OCT optical coherence tomography; IOP intraocular pressure; BRVO Branch retinal vein occlusion; CRVO central retinal vein occlusion; CRAO central retinal artery occlusion; BRAO branch retinal artery occlusion; RT retinal tear; SB scleral buckle; PPV pars plana vitrectomy; VH Vitreous hemorrhage; PRP panretinal laser photocoagulation; IVK intravitreal kenalog; VMT vitreomacular traction; MH Macular hole;  NVD neovascularization of the disc; NVE neovascularization elsewhere; AREDS age related eye disease study; ARMD age related macular degeneration; POAG primary open angle glaucoma; EBMD epithelial/anterior basement membrane dystrophy; ACIOL anterior chamber intraocular lens; IOL intraocular lens; PCIOL posterior chamber intraocular lens; Phaco/IOL phacoemulsification with intraocular lens placement; [place] photorefractive keratectomy; LASIK laser assisted in situ keratomileusis; HTN hypertension; DM diabetes mellitus; COPD chronic obstructive pulmonary disease

## 2020-08-13 NOTE — Assessment & Plan Note (Signed)
Okay to proceed with cataract traction with intraocular placement OU at any time Cataract(s) account for the patient's complaint. I discussed the risks and benefits of cataract surgery. Options were explained to the patient. The patient understands that new glasses may not improve their vision and desires to have cataract surgery. I have recommended follow up with their general eye care doctor for evaluation and consideration of cataract extraction with new intraocular lens insertion. 

## 2020-08-13 NOTE — Patient Instructions (Signed)
Patient deserves to have consideration for cataract traction intraocular lens placement.  List the names will be offered

## 2020-08-23 DIAGNOSIS — E1169 Type 2 diabetes mellitus with other specified complication: Secondary | ICD-10-CM | POA: Diagnosis not present

## 2020-08-24 DIAGNOSIS — E1169 Type 2 diabetes mellitus with other specified complication: Secondary | ICD-10-CM | POA: Diagnosis not present

## 2020-08-27 ENCOUNTER — Other Ambulatory Visit: Payer: Self-pay | Admitting: Cardiology

## 2020-09-01 DIAGNOSIS — I10 Essential (primary) hypertension: Secondary | ICD-10-CM | POA: Diagnosis not present

## 2020-09-01 DIAGNOSIS — E7800 Pure hypercholesterolemia, unspecified: Secondary | ICD-10-CM | POA: Diagnosis not present

## 2020-09-01 DIAGNOSIS — E1169 Type 2 diabetes mellitus with other specified complication: Secondary | ICD-10-CM | POA: Diagnosis not present

## 2020-09-22 DIAGNOSIS — E1169 Type 2 diabetes mellitus with other specified complication: Secondary | ICD-10-CM | POA: Diagnosis not present

## 2020-09-22 DIAGNOSIS — I10 Essential (primary) hypertension: Secondary | ICD-10-CM | POA: Diagnosis not present

## 2020-09-22 DIAGNOSIS — E7800 Pure hypercholesterolemia, unspecified: Secondary | ICD-10-CM | POA: Diagnosis not present

## 2020-10-01 ENCOUNTER — Ambulatory Visit (INDEPENDENT_AMBULATORY_CARE_PROVIDER_SITE_OTHER): Payer: PPO | Admitting: Ophthalmology

## 2020-10-01 ENCOUNTER — Other Ambulatory Visit: Payer: Self-pay

## 2020-10-01 ENCOUNTER — Encounter (INDEPENDENT_AMBULATORY_CARE_PROVIDER_SITE_OTHER): Payer: Self-pay | Admitting: Ophthalmology

## 2020-10-01 DIAGNOSIS — E113412 Type 2 diabetes mellitus with severe nonproliferative diabetic retinopathy with macular edema, left eye: Secondary | ICD-10-CM

## 2020-10-01 DIAGNOSIS — E113411 Type 2 diabetes mellitus with severe nonproliferative diabetic retinopathy with macular edema, right eye: Secondary | ICD-10-CM | POA: Diagnosis not present

## 2020-10-01 MED ORDER — AFLIBERCEPT 2MG/0.05ML IZ SOLN FOR KALEIDOSCOPE
2.0000 mg | INTRAVITREAL | Status: AC | PRN
Start: 2020-10-01 — End: 2020-10-01
  Administered 2020-10-01: 2 mg via INTRAVITREAL

## 2020-10-01 NOTE — Progress Notes (Signed)
10/01/2020     CHIEF COMPLAINT Patient presents for Retina Follow Up (3 Month NPDR f\u OD. Possible Eylea OD. OCT/Pt states vision is doing well other than cataracts. Pt denies complaints./BGL: 94 yesterday)   HISTORY OF PRESENT ILLNESS: Brian Herring is a 65 y.o. male who presents to the clinic today for:   HPI    Retina Follow Up    Patient presents with  Diabetic Retinopathy.  In right eye.  Severity is severe.  Duration of 3 months.  Since onset it is stable.  I, the attending physician,  performed the HPI with the patient and updated documentation appropriately. Additional comments: 3 Month NPDR f\u OD. Possible Eylea OD. OCT Pt states vision is doing well other than cataracts. Pt denies complaints. BGL: 94 yesterday       Last edited by Tilda Franco on 10/01/2020 10:01 AM. (History)      Referring physician: [name], MD [place],  [place] [address]  HISTORICAL INFORMATION:   Selected notes from the MEDICAL RECORD NUMBER    Lab Results  Component Value Date   HGBA1C 7.0 (H) 03/12/2018     CURRENT MEDICATIONS: No current outpatient medications on file. (Ophthalmic Drugs)   No current facility-administered medications for this visit. (Ophthalmic Drugs)   Current Outpatient Medications (Other)  Medication Sig  . aspirin EC 81 MG tablet Take 81 mg by mouth daily.  . blood glucose meter kit and supplies KIT Dispense based on patient and insurance preference. Use up to four times daily as directed. (FOR ICD-10: E 11.65).  . furosemide (LASIX) 40 MG tablet TAKE ONE TABLET BY MOUTH ONCE DAILY  . glimepiride (AMARYL) 4 MG tablet Take 0.5 tablets (2 mg total) by mouth every morning. (Patient taking differently: Take 4 mg by mouth every morning. )  . ivabradine ([name]) 5 MG TABS tablet Take 1 tablet (5 mg total) by mouth 2 (two) times daily with a meal.  . JARDIANCE 25 MG TABS tablet Take 1 tablet by mouth daily.  . Lancets (ONETOUCH  DELICA PLUS [id number]) MISC Use to test blood sugar daily  . metoprolol succinate (TOPROL-XL) 25 MG 24 hr tablet TAKE 1 AND 1/2 TABLETS BY MOUTH ONCE DAILY  . mupirocin ointment (BACTROBAN) 2 % Apply to affected area three times daily for 5 days  . ONE TOUCH ULTRA TEST test strip Use to test blood sugar daily  . rosuvastatin (CRESTOR) 10 MG tablet Take 1 tablet (10 mg total) by mouth daily.  . sacubitril-valsartan (ENTRESTO) 97-103 MG Take 1 tablet by mouth 2 (two) times daily.  Marland Kitchen spironolactone (ALDACTONE) 25 MG tablet Take 1 tablet (25 mg total) by mouth daily.   No current facility-administered medications for this visit. (Other)      REVIEW OF SYSTEMS: ROS    Positive for: Endocrine   Last edited by Tilda Franco on 10/01/2020  9:57 AM. (History)       ALLERGIES Allergies  Allergen Reactions  . Elemental Sulfur Rash  . Norvasc [Amlodipine Besylate] Rash  . Sulfa Antibiotics Rash    PAST MEDICAL HISTORY Past Medical History:  Diagnosis Date  . Arthritis    shoulder  . Cancer ([facility])    skin cancer on nose  . Cardiomyopathy ([place])   . Chronic systolic CHF (congestive heart failure) ([facility])   . Diabetes mellitus without complication ([facility])   . Hyperlipidemia   . Hypertension    Past Surgical History:  Procedure Laterality  Date  . AMPUTATION Right 10/14/2017   Procedure: AMPUTATION  GREAT TOE;  Surgeon: [name], MD;  Location: [facility];  Service: Orthopedics;  Laterality: Right;  . AMPUTATION Right 10/18/2017   Procedure: RIGHT BELOW KNEE AMPUTATION;  Surgeon: [name], MD;  Location: [facility];  Service: Orthopedics;  Laterality: Right;  . CYSTOSCOPY WITH INSERTION OF UROLIFT N/A 03/15/2018   Procedure: CYSTOSCOPY WITH INSERTION OF UROLIFT;  Surgeon: Franchot Gallo, MD;  Location: [facility];  Service: Urology;  Laterality: N/A;  . SHOULDER SURGERY  01/05  . STUMP REVISION Right 12/15/2017   Procedure: REVISION RIGHT BELOW KNEE AMPUTATION;  Surgeon: [name], MD;  Location: [facility];  Service: Orthopedics;  Laterality: Right;    FAMILY HISTORY Family History  Problem Relation Age of Onset  . Pneumonia Mother        aspiration    SOCIAL HISTORY Social History   Tobacco Use  . Smoking status: Never Smoker  . Smokeless tobacco: Never Used  Vaping Use  . Vaping Use: Never used  Substance Use Topics  . Alcohol use: No    Alcohol/week: 0.0 standard drinks  . Drug use: Never         OPHTHALMIC EXAM: Base Eye Exam    Visual Acuity (Snellen - Linear)      Right Left   Dist cc 20/30 -1 20/80   Dist ph cc  20/60 +2   Correction: Glasses       Tonometry (Tonopen, 10:03 AM)      Right Left   Pressure 6 9       Pupils      Pupils Dark Light Shape React APD   Right PERRL 4 3 Round Brisk None   Left PERRL 4 3 Round Brisk None       Visual Fields (Counting fingers)      Left Right    Full Full       Neuro/Psych    Oriented x3: Yes   Mood/Affect: Normal       Dilation    Right eye: 1.0% Mydriacyl, 2.5% Phenylephrine @ 10:03 AM        Slit Lamp and Fundus Exam    External Exam      Right Left   External Normal Normal       Slit Lamp Exam      Right Left   Lids/Lashes Normal Normal   Conjunctiva/Sclera White and quiet White and quiet   Cornea Clear Clear   Anterior Chamber Deep and quiet Deep and quiet   Iris Round and reactive Round and reactive   Lens 2+ Nuclear sclerosis, Cortical spokes 2+ 2+ Nuclear sclerosis   Anterior Vitreous Normal Normal       Fundus Exam      Right Left   Posterior Vitreous Posterior vitreous detachment    Disc Normal    C/D Ratio 0.2    Macula Clinically significant macular edema, Mild clinically significant macular edema, Hemorrhage, Microaneurysms, Macular thickening    Vessels Severe nonproliferative diabetic retinopathy    Periphery Normal, moderate scatter PRP nasal.  Room temporally and superiorly for additional laser           IMAGING AND PROCEDURES  Imaging and  Procedures for 10/01/20  OCT, Retina - OU - Both Eyes       Right Eye Quality was good. Scan locations included subfoveal. Central Foveal Thickness: 296. Progression has been stable. Findings include abnormal foveal contour, cystoid  macular edema.   Left Eye Scan locations included subfoveal. Central Foveal Thickness: 352. Progression has been stable. Findings include abnormal foveal contour, cystoid macular edema.   Notes Chronic CSME CME OU, overall vastly improved as compared to onset  And increased CSME left eye 7-week follow-up.         Intravitreal Injection, Pharmacologic Agent - OD - Right Eye       Time Out 10/01/2020. 10:33 AM. Confirmed correct patient, procedure, site, and patient consented.   Anesthesia Topical anesthesia was used. Anesthetic medications included [place] 3.5%.   Procedure Preparation included 10% betadine to eyelids, 5% betadine to ocular surface, Tobramycin 0.3%, Ofloxacin . A 30 gauge needle was used.   Injection:  2 mg aflibercept Alfonse Flavors) SOLN   NDC: [id number], Lot: [id number]   Route: Intravitreal, Site: Right Eye, Waste: 0 mg  Post-op Post injection exam found visual acuity of at least counting fingers. The patient tolerated the procedure well. There were no complications. The patient received written and verbal post procedure care education. Post injection medications were not given.                 ASSESSMENT/PLAN:  Severe nonproliferative diabetic retinopathy of left eye, with macular edema, associated with type 2 diabetes mellitus ([place]) Left eye with persistent center involved CSME follow-up as scheduled 3 weeks  Dilate OU next and FFA in conjunction with the evaluation  Severe nonproliferative diabetic retinopathy of right eye, with macular edema, associated with type 2 diabetes mellitus ([facility]) Correct center involved CSME improved at current interval of 3 months.  Repeat injection today and maintain 20-monthevaluation  right eye  Next visit in 3 weeks dilate OU and will need fluorescein angiography to study the retinal perfusion peripherally      ICD-10-CM   1. Severe nonproliferative diabetic retinopathy of right eye, with macular edema, associated with type 2 diabetes mellitus ([facility])  E11.3411 OCT, Retina - OU - Both Eyes    Intravitreal Injection, Pharmacologic Agent - OD - Right Eye    aflibercept (EYLEA) SOLN 2 mg  2. Severe nonproliferative diabetic retinopathy of left eye, with macular edema, associated with type 2 diabetes mellitus ([place]  [id number]    1.  Center involved CSME OD improving at 34-monthollow-up today.  Repeat injection today will need assessed next visit, 3 weeks, status of retinal perfusion peripherally  2.  CEnter involved CSME OS persists follow-up in 3 weeks as scheduled    3.  Late OU next, FFA OS OD next likely injection OS  Ophthalmic Meds Ordered this visit:  Meds ordered this encounter  Medications  . aflibercept (EYLEA) SOLN 2 mg       Return in about 3 weeks (around 10/22/2020) for DILATE OU, OCT, OPTOS FFA R/L, EYLEA OCT, OS.  There are no Patient Instructions on file for this visit.   Explained the diagnoses, plan, and follow up with the patient and they expressed understanding.  Patient expressed understanding of the importance of proper follow up care.   GaClent Demarkankin M.D. Diseases & Surgery of the Retina and Vitreous Retina & Diabetic EyPershing3/10/22     Abbreviations: M myopia (nearsighted); A astigmatism; H hyperopia (farsighted); P presbyopia; Mrx spectacle prescription;  CTL contact lenses; OD right eye; OS left eye; OU both eyes  XT exotropia; ET esotropia; PEK punctate epithelial keratitis; PEE punctate epithelial erosions; DES dry eye syndrome; MGD meibomian gland dysfunction; ATs artificial tears; PFAT's preservative free artificial tears; NSPeach [place]  nuclear sclerotic cataract; PSC posterior subcapsular cataract; ERM epi-retinal membrane; PVD  posterior vitreous detachment; RD retinal detachment; DM diabetes mellitus; DR diabetic retinopathy; NPDR non-proliferative diabetic retinopathy; PDR proliferative diabetic retinopathy; CSME clinically significant macular edema; DME diabetic macular edema; dbh dot blot hemorrhages; CWS cotton wool spot; POAG primary open angle glaucoma; C/D cup-to-disc ratio; HVF humphrey visual field; GVF goldmann visual field; OCT optical coherence tomography; IOP intraocular pressure; BRVO Branch retinal vein occlusion; CRVO central retinal vein occlusion; CRAO central retinal artery occlusion; BRAO branch retinal artery occlusion; RT retinal tear; SB scleral buckle; PPV pars plana vitrectomy; VH Vitreous hemorrhage; PRP panretinal laser photocoagulation; IVK intravitreal kenalog; VMT vitreomacular traction; MH Macular hole;  NVD neovascularization of the disc; NVE neovascularization elsewhere; AREDS age related eye disease study; ARMD age related macular degeneration; POAG primary open angle glaucoma; EBMD epithelial/anterior basement membrane dystrophy; ACIOL anterior chamber intraocular lens; IOL intraocular lens; PCIOL posterior chamber intraocular lens; Phaco/IOL phacoemulsification with intraocular lens placement; [place] photorefractive keratectomy; LASIK laser assisted in situ keratomileusis; HTN hypertension; DM diabetes mellitus; COPD chronic obstructive pulmonary disease

## 2020-10-01 NOTE — Assessment & Plan Note (Signed)
Left eye with persistent center involved CSME follow-up as scheduled 3 weeks  Dilate OU next and FFA in conjunction with the evaluation

## 2020-10-01 NOTE — Assessment & Plan Note (Signed)
Correct center involved CSME improved at current interval of 3 months.  Repeat injection today and maintain 69-month evaluation right eye  Next visit in 3 weeks dilate OU and will need fluorescein angiography to study the retinal perfusion peripherally

## 2020-10-22 ENCOUNTER — Encounter (INDEPENDENT_AMBULATORY_CARE_PROVIDER_SITE_OTHER): Payer: Self-pay | Admitting: Ophthalmology

## 2020-10-22 ENCOUNTER — Encounter (INDEPENDENT_AMBULATORY_CARE_PROVIDER_SITE_OTHER): Payer: PPO | Admitting: Ophthalmology

## 2020-10-22 ENCOUNTER — Ambulatory Visit (INDEPENDENT_AMBULATORY_CARE_PROVIDER_SITE_OTHER): Payer: PPO | Admitting: Ophthalmology

## 2020-10-22 ENCOUNTER — Other Ambulatory Visit: Payer: Self-pay

## 2020-10-22 DIAGNOSIS — E113412 Type 2 diabetes mellitus with severe nonproliferative diabetic retinopathy with macular edema, left eye: Secondary | ICD-10-CM | POA: Diagnosis not present

## 2020-10-22 DIAGNOSIS — E113411 Type 2 diabetes mellitus with severe nonproliferative diabetic retinopathy with macular edema, right eye: Secondary | ICD-10-CM | POA: Diagnosis not present

## 2020-10-22 DIAGNOSIS — E1169 Type 2 diabetes mellitus with other specified complication: Secondary | ICD-10-CM | POA: Diagnosis not present

## 2020-10-22 MED ORDER — AFLIBERCEPT 2MG/0.05ML IZ SOLN FOR KALEIDOSCOPE
2.0000 mg | INTRAVITREAL | Status: AC | PRN
  Administered 2020-10-22: 2 mg via INTRAVITREAL

## 2020-10-22 MED ORDER — FLUORESCEIN SODIUM 10 % IV SOLN
500.0000 mg | INTRAVENOUS | Status: AC | PRN
  Administered 2020-10-22: 500 mg via INTRAVENOUS

## 2020-10-22 NOTE — Assessment & Plan Note (Addendum)
Much improved on Eylea, follow-up as scheduled next OD, plan 3 months from last visit on 10-01-2020 so thus around mid June

## 2020-10-22 NOTE — Assessment & Plan Note (Signed)
Improved CSME OS overall, still slightly active at 10-week interval.  We will keep this stable with intravitreal Eylea today.  Fluorescein angiography confirms some peripheral nonperfusion may 1 day complete PRP to prevent PDR progression and decrease vegF load

## 2020-10-22 NOTE — Progress Notes (Addendum)
10/22/2020     CHIEF COMPLAINT Patient presents for Retina Follow Up (10 Wk F/U OU, FP/FFA R/L, poss Eylea OS//Pt denies noticeable changes to [place] OU since last visit. Pt denies ocular pain, flashes of light, or floaters OU. /A1c: 8.0, 01/22/LBS: 91 yesterday)   HISTORY OF PRESENT ILLNESS: Brian Herring is a 65 y.o. male who presents to the clinic today for:   HPI    Retina Follow Up    Patient presents with  Diabetic Retinopathy.  In left eye.  This started 10 weeks ago.  Severity is mild.  Duration of 10 weeks.  Since onset it is stable. Additional comments: 10 Wk F/U OU, FP/FFA R/L, poss Eylea OS  Pt denies noticeable changes to [place] OU since last visit. Pt denies ocular pain, flashes of light, or floaters OU.  A1c: 8.0, 01/22 LBS: 91 yesterday       Last edited by Rockie Neighbours, [place] on 10/22/2020 10:53 AM. (History)      Referring physician: [name], MD [place],  [place] [phone]  HISTORICAL INFORMATION:   Selected notes from the MEDICAL RECORD NUMBER    Lab Results  Component Value Date   HGBA1C 7.0 (H) 03/12/2018     CURRENT MEDICATIONS: No current outpatient medications on file. (Ophthalmic Drugs)   No current facility-administered medications for this visit. (Ophthalmic Drugs)   Current Outpatient Medications (Other)  Medication Sig  . aspirin EC 81 MG tablet Take 81 mg by mouth daily.  . blood glucose meter kit and supplies KIT Dispense based on patient and insurance preference. Use up to four times daily as directed. (FOR ICD-10: E 11.65).  . furosemide (LASIX) 40 MG tablet TAKE ONE TABLET BY MOUTH ONCE DAILY  . glimepiride (AMARYL) 4 MG tablet Take 0.5 tablets (2 mg total) by mouth every morning. (Patient taking differently: Take 4 mg by mouth every morning. )  . ivabradine ([name]) 5 MG TABS tablet Take 1 tablet (5 mg total) by mouth 2 (two) times daily with a meal.  . JARDIANCE 25 MG TABS tablet Take 1 tablet by mouth daily.   . Lancets (ONETOUCH DELICA PLUS [id number]) MISC Use to test blood sugar daily  . metoprolol succinate (TOPROL-XL) 25 MG 24 hr tablet TAKE 1 AND 1/2 TABLETS BY MOUTH ONCE DAILY  . mupirocin ointment (BACTROBAN) 2 % Apply to affected area three times daily for 5 days  . ONE TOUCH ULTRA TEST test strip Use to test blood sugar daily  . rosuvastatin (CRESTOR) 10 MG tablet Take 1 tablet (10 mg total) by mouth daily.  . sacubitril-valsartan (ENTRESTO) 97-103 MG Take 1 tablet by mouth 2 (two) times daily.  Marland Kitchen spironolactone (ALDACTONE) 25 MG tablet Take 1 tablet (25 mg total) by mouth daily.   No current facility-administered medications for this visit. (Other)      REVIEW OF SYSTEMS:    ALLERGIES Allergies  Allergen Reactions  . Elemental Sulfur Rash  . Norvasc [Amlodipine Besylate] Rash  . Sulfa Antibiotics Rash    PAST MEDICAL HISTORY Past Medical History:  Diagnosis Date  . Arthritis    shoulder  . Cancer ([place])    skin cancer on nose  . Cardiomyopathy ([place])   . Chronic systolic CHF (congestive heart failure) ([place])   . Diabetes mellitus without complication ([place])   . Hyperlipidemia   . Hypertension    Past Surgical History:  Procedure Laterality Date  . AMPUTATION Right 10/14/2017   Procedure: AMPUTATION  GREAT TOE;  Surgeon: [name], MD;  Location: [facility];  Service: Orthopedics;  Laterality: Right;  . AMPUTATION Right 10/18/2017   Procedure: RIGHT BELOW KNEE AMPUTATION;  Surgeon: [name], MD;  Location: [facility];  Service: Orthopedics;  Laterality: Right;  . CYSTOSCOPY WITH INSERTION OF UROLIFT N/A 03/15/2018   Procedure: CYSTOSCOPY WITH INSERTION OF UROLIFT;  Surgeon: Franchot Gallo, MD;  Location: [facility];  Service: Urology;  Laterality: N/A;  . SHOULDER SURGERY  01/05  . STUMP REVISION Right 12/15/2017   Procedure: REVISION RIGHT BELOW KNEE AMPUTATION;  Surgeon: [name], MD;  Location: [facility];  Service: Orthopedics;  Laterality: Right;    FAMILY  HISTORY Family History  Problem Relation Age of Onset  . Pneumonia Mother        aspiration    SOCIAL HISTORY Social History   Tobacco Use  . Smoking status: Never Smoker  . Smokeless tobacco: Never Used  Vaping Use  . Vaping Use: Never used  Substance Use Topics  . Alcohol use: No    Alcohol/week: 0.0 standard drinks  . Drug use: Never         OPHTHALMIC EXAM:  Base Eye Exam    Visual Acuity (ETDRS)      Right Left   Dist cc 20/25 -1 20/60 -2   Dist ph cc  20/50 +2   Correction: Glasses       Tonometry (Tonopen, 10:53 AM)      Right Left   Pressure 13 16       Pupils      Pupils Dark Light Shape React APD   Right PERRL 5.5 4.5 Round Brisk None   Left PERRL 5.5 4.5 Round Brisk None       Visual Fields (Counting fingers)      Left Right    Full Full       Extraocular Movement      Right Left    Full Full       Neuro/Psych    Oriented x3: Yes   Mood/Affect: Normal       Dilation    Both eyes: 1.0% Mydriacyl, 2.5% Phenylephrine @ 10:59 AM        Slit Lamp and Fundus Exam    External Exam      Right Left   External Normal Normal       Slit Lamp Exam      Right Left   Lids/Lashes Normal Normal   Conjunctiva/Sclera White and quiet White and quiet   Cornea Clear Clear   Anterior Chamber Deep and quiet Deep and quiet   Iris Round and reactive Round and reactive   Lens 2+ Nuclear sclerosis, Cortical spokes 2+ 2+ Nuclear sclerosis   Anterior Vitreous Normal Normal       Fundus Exam      Right Left   Posterior Vitreous Posterior vitreous detachment Normal   Disc Normal Normal   C/D Ratio 0.2 0.35   Macula Clinically significant macular edema, Mild clinically significant macular edema, Hemorrhage, Microaneurysms, Macular thickening Clinically significant macular edema, Mild clinically significant macular edema   Vessels Severe nonproliferative diabetic retinopathy Severe NPDR   Periphery Normal, moderate scatter PRP nasal.  Room temporally  and superiorly for additional laser Moderate scatter pattern laser, peripherally, room temporally for more laser          IMAGING AND PROCEDURES  Imaging and Procedures for 10/22/20  OCT, Retina - OU - Both Eyes  Right Eye Quality was good. Scan locations included subfoveal. Central Foveal Thickness: 301. Progression has been stable. Findings include abnormal foveal contour, cystoid macular edema.   Left Eye Scan locations included subfoveal. Central Foveal Thickness: 332. Progression has been stable. Findings include abnormal foveal contour, cystoid macular edema.   Notes Chronic CSME CME OU, overall vastly improved as compared to onset,   And decreased CSME left eye 10-week follow-up.            Color Fundus Photography Optos - OU - Both Eyes       Right Eye Macula : edema, microaneurysms.   Left Eye Disc findings include normal observations. Macula : microaneurysms, edema.   Notes Severe nonproliferative diabetic retinopathy OU, moderate scatter PRP OD.  Moderate scatter inferonasal OS.  OU CSME continues yet improve  Vitreous debris centrally OU.       Fluorescein Angiography Optos (Transit OD)       Injection:  500 mg Fluorescein Sodium 10 % injection   NDC: [phone]   Route: Intravenous, Site: Left ArmRight Eye   Progression has improved. Early phase findings include leakage, window defect. Mid/Late phase findings include leakage, microaneurysm, vascular perfusion defect. Choroidal neovascularization is not present.   Left Eye   Progression has improved. Mid/Late phase findings include leakage, microaneurysm, vascular perfusion defect. Choroidal neovascularization is not present.   Notes Clinically significant MAC edema persist and is strikingly of few microaneurysms in the parafoveal region each eye however there is a concentration temporally which does raise the specter of possible MAC-TEL superimposed here.  We will continue with  ongoing therapy to try to controlled clinically significant macular edema OU while delivering peripheral PRP to regions of retinal nonperfusion         Intravitreal Injection, Pharmacologic Agent - OS - Left Eye       Time Out 10/22/2020. 12:09 PM. Confirmed correct patient, procedure, site, and patient consented.   Anesthesia Topical anesthesia was used. Anesthetic medications included [place] 3.5%.   Procedure Preparation included 10% betadine to eyelids, 5% betadine to ocular surface, Tobramycin 0.3%. A 30 gauge needle was used.   Injection:  2 mg aflibercept Alfonse Flavors) SOLN   NDC: [id number], Lot: [id number]   Route: Intravitreal, Site: Left Eye, Waste: 0 mg  Post-op Post injection exam found visual acuity of at least counting fingers. The patient tolerated the procedure well. There were no complications. The patient received written and verbal post procedure care education. Post injection medications were not given.                 ASSESSMENT/PLAN:  Severe nonproliferative diabetic retinopathy of left eye, with macular edema, associated with type 2 diabetes mellitus (HCC) Improved CSME OS overall, still slightly active at 10-week interval.  We will keep this stable with intravitreal Eylea today.  Fluorescein angiography confirms some peripheral nonperfusion may 1 day complete PRP to prevent PDR progression and decrease vegF load  Severe nonproliferative diabetic retinopathy of right eye, with macular edema, associated with type 2 diabetes mellitus ([place]) Much improved on Eylea, follow-up as scheduled next OD, plan 3 months from last visit on 10-01-2020 so thus around mid June      ICD-10-CM   1. Severe nonproliferative diabetic retinopathy of left eye, with macular edema, associated with type 2 diabetes mellitus (HCC)  S56.8127 OCT, Retina - OU - Both Eyes    Color Fundus Photography Optos - OU - Both Eyes    Fluorescein Angiography Optos (Transit OD)  Fluorescein  Sodium 10 % injection 500 mg    Intravitreal Injection, Pharmacologic Agent - OS - Left Eye    aflibercept (EYLEA) SOLN 2 mg  2. Severe nonproliferative diabetic retinopathy of right eye, with macular edema, associated with type 2 diabetes mellitus (HCC)  E11.3411 OCT, Retina - OU - Both Eyes    Color Fundus Photography Optos - OU - Both Eyes    Fluorescein Angiography Optos (Transit OD)    Fluorescein Sodium 10 % injection 500 mg    1.  Repeat injection intravitreal Eylea OS today at 10 weeks and follow-up again in 10 to 12 weeks OS  2.  Dilate OD next as scheduled  3.  Ophthalmic Meds Ordered this visit:  Meds ordered this encounter  Medications  . Fluorescein Sodium 10 % injection 500 mg  . aflibercept (EYLEA) SOLN 2 mg       Return in about 3 months (around 01/21/2021) for dilate, OS, EYLEA OCT.  There are no Patient Instructions on file for this visit.   Explained the diagnoses, plan, and follow up with the patient and they expressed understanding.  Patient expressed understanding of the importance of proper follow up care.   Clent Demark Dashauna Heymann M.D. Diseases & Surgery of the Retina and Vitreous [facility]ina [facility] Diabetic [place] 10/22/20     Abbreviations: M myopia (nearsighted); A astigmatism; H hyperopia (farsighted); P presbyopia; Mrx spectacle prescription;  CTL contact lenses; OD right eye; OS left eye; OU both eyes  XT exotropia; ET esotropia; PEK punctate epithelial keratitis; PEE punctate epithelial erosions; DES dry eye syndrome; MGD meibomian gland dysfunction; ATs artificial tears; PFAT's preservative free artificial tears; [place] nuclear sclerotic cataract; PSC posterior subcapsular cataract; ERM epi-retinal membrane; PVD posterior vitreous detachment; RD retinal detachment; DM diabetes mellitus; DR diabetic retinopathy; NPDR non-proliferative diabetic retinopathy; PDR proliferative diabetic retinopathy; CSME clinically significant macular edema; DME diabetic macular edema;  dbh dot blot hemorrhages; CWS cotton wool spot; POAG primary open angle glaucoma; C/D cup-to-disc ratio; HVF humphrey visual field; GVF goldmann visual field; OCT optical coherence tomography; IOP intraocular pressure; BRVO Branch retinal vein occlusion; CRVO central retinal vein occlusion; CRAO central retinal artery occlusion; BRAO branch retinal artery occlusion; RT retinal tear; SB scleral buckle; PPV pars plana vitrectomy; VH Vitreous hemorrhage; PRP panretinal laser photocoagulation; IVK intravitreal kenalog; VMT vitreomacular traction; MH Macular hole;  NVD neovascularization of the disc; NVE neovascularization elsewhere; AREDS age related eye disease study; ARMD age related macular degeneration; POAG primary open angle glaucoma; EBMD epithelial/anterior basement membrane dystrophy; ACIOL anterior chamber intraocular lens; IOL intraocular lens; PCIOL posterior chamber intraocular lens; Phaco/IOL phacoemulsification with intraocular lens placement; [place] photorefractive keratectomy; LASIK laser assisted in situ keratomileusis; HTN hypertension; DM diabetes mellitus; COPD chronic obstructive pulmonary disease

## 2020-10-23 ENCOUNTER — Ambulatory Visit: Payer: PPO | Admitting: Cardiology

## 2020-11-20 DIAGNOSIS — E1169 Type 2 diabetes mellitus with other specified complication: Secondary | ICD-10-CM | POA: Diagnosis not present

## 2020-11-23 ENCOUNTER — Other Ambulatory Visit: Payer: Self-pay | Admitting: Cardiology

## 2020-11-25 DIAGNOSIS — E1169 Type 2 diabetes mellitus with other specified complication: Secondary | ICD-10-CM | POA: Diagnosis not present

## 2020-11-25 DIAGNOSIS — E7800 Pure hypercholesterolemia, unspecified: Secondary | ICD-10-CM | POA: Diagnosis not present

## 2020-11-25 DIAGNOSIS — I10 Essential (primary) hypertension: Secondary | ICD-10-CM | POA: Diagnosis not present

## 2020-11-30 ENCOUNTER — Other Ambulatory Visit: Payer: Self-pay | Admitting: Cardiology

## 2020-12-04 ENCOUNTER — Ambulatory Visit: Payer: PPO | Admitting: Cardiology

## 2020-12-11 ENCOUNTER — Ambulatory Visit: Payer: PPO | Admitting: Cardiology

## 2020-12-11 ENCOUNTER — Other Ambulatory Visit: Payer: Self-pay

## 2020-12-11 ENCOUNTER — Encounter: Payer: Self-pay | Admitting: Cardiology

## 2020-12-11 VITALS — BP 103/59 | HR 75 | Temp 98.0°F | Resp 16 | Ht 72.0 in | Wt 233.0 lb

## 2020-12-11 DIAGNOSIS — I428 Other cardiomyopathies: Secondary | ICD-10-CM | POA: Diagnosis not present

## 2020-12-11 DIAGNOSIS — E1152 Type 2 diabetes mellitus with diabetic peripheral angiopathy with gangrene: Secondary | ICD-10-CM | POA: Diagnosis not present

## 2020-12-11 DIAGNOSIS — Z794 Long term (current) use of insulin: Secondary | ICD-10-CM

## 2020-12-11 MED ORDER — METOPROLOL SUCCINATE ER 25 MG PO TB24
25.0000 mg | ORAL_TABLET | Freq: Every day | ORAL | 2 refills | Status: DC
Start: 2020-12-11 — End: 2021-02-18

## 2020-12-11 NOTE — Progress Notes (Signed)
Subjective:   Brian Herring, male    DOB: 1955/12/22, 65 y.o.   MRN: [id number]    Chief complaint:  Cardiomyopathy  65 year old Caucasian male with hypertension, uncontrolled type 2 DM, s/p Rt transtibial amputation (2019) for osteomyelitis, nonischemic cardiomyopathy, CKD stage 3-4.  He is doing well. HbA1c was 8.1% in 06/2020.He has made changes to his diet and is hoping to have improvement on the upcoming labs with PCP next month. He denies chest pain, shortness of breath, palpitations, leg edema, orthopnea, PND, TIA/syncope.   Current Outpatient Medications on File Prior to Visit  Medication Sig Dispense Refill  . aspirin EC 81 MG tablet Take 81 mg by mouth daily.    . blood glucose meter kit and supplies KIT Dispense based on patient and insurance preference. Use up to four times daily as directed. (FOR ICD-10: E 11.65). 1 each 0  . furosemide (LASIX) 40 MG tablet TAKE ONE TABLET BY MOUTH ONCE DAILY 90 tablet 1  . ivabradine ([name]) 5 MG TABS tablet Take 1 tablet (5 mg total) by mouth 2 (two) times daily with a meal. 180 tablet 1  . JARDIANCE 25 MG TABS tablet Take 1 tablet by mouth daily.    . Lancets (ONETOUCH DELICA PLUS [id number]) MISC Use to test blood sugar daily  3  . metoprolol succinate (TOPROL-XL) 25 MG 24 hr tablet TAKE 1 AND 1/2 TABLETS BY MOUTH ONCE DAILY 135 tablet 2  . mupirocin ointment (BACTROBAN) 2 % Apply to affected area three times daily for 5 days  0  . ONE TOUCH ULTRA TEST test strip Use to test blood sugar daily  1  . rosuvastatin (CRESTOR) 10 MG tablet Take 1 tablet (10 mg total) by mouth daily. 90 tablet 1  . sacubitril-valsartan (ENTRESTO) 97-103 MG Take 1 tablet by mouth 2 (two) times daily. 60 tablet 3  . spironolactone (ALDACTONE) 25 MG tablet TAKE ONE TABLET BY MOUTH ONCE DAILY Needs appointment for further refills 30 tablet 2  . glipiZIDE (GLUCOTROL XL) 5 MG 24 hr tablet Take 5 mg by mouth daily.    . metFORMIN (GLUCOPHAGE-XR) 500 MG 24 hr  tablet Take 500 mg by mouth 2 (two) times daily.     No current facility-administered medications on file prior to visit.    Cardiovascular studies:  EKG 12/11/2020: Sinus rhythm 76 bpm  Left bundle branch block  Echocardiogram 11/01/2019:  Left ventricle cavity is normal in size. Moderate concentric hypertrophy  of the left ventricle. Mildly depressed LV systolic function with visual  EF 45-50%. Normal global wall motion. Doppler evidence of grade I  (impaired) diastolic dysfunction, normal LAP. Left atrial cavity is normal  in size. Aneurysmal interatrial septum without 2D or color Doppler  evidence of interatrial shunt.  No significant valvular abnormality.  Normal right atrial pressure.  No significant change compared to previous study on 12/06/2018.  ABI 06/21/2017:  This exam reveals normal perfusion of the lower extremity (ABI) with bilateral ABI 1.08 with triphasic waveform (normal).  Lexiscan myoview stress test 04/28/2017: 1. Abnormal pharmacologic stress nuclear study but negative for ischemia.     2. Low risk of hemodynamically significant coronary artery disease. Prognostically, this is a high risk study. 3. Non diagnostic stress EKG due to pharmacologic stress.  4. Fixed perfusion defect due to diaphragmatic attenuation. The left ventricle is severely dilated. Gated SPECT imaging demonstrates severe global hypokinesis. The gated study showed the ejection fraction was 10-15%. Findings consistent with non-ischemic  cardiomyopathy.  Recent labs: 06/29/2020: Glucose 186.  HbA1C 8.1%  10/14/2019: Glucose 110, BUN/Cr 25/1.23. EGFR 62. Na/K 141/5.3.   06/24/2019: HbA1C 7.4%   Review of Systems  Cardiovascular: Negative for chest pain, dyspnea on exertion, leg swelling, palpitations and syncope.       Vitals:   12/11/20 1056  BP: (!) 103/59  Pulse: 75  Resp: 16  Temp: 98 F (36.7 C)  SpO2: 96%     Objective:    Physical Exam Vitals and nursing note  reviewed.  Constitutional:      Appearance: He is well-developed.  Neck:     Vascular: No JVD.  Cardiovascular:     Rate and Rhythm: Normal rate and regular rhythm.     Pulses: Intact distal pulses.     Heart sounds: Normal heart sounds.     Comments: RLE Pulmonary:     Effort: Pulmonary effort is normal.     Breath sounds: Normal breath sounds. No wheezing or rales.  Musculoskeletal:     Left lower leg: Edema (Trace) present.     Comments: Rt transtibial amputation.           Assessment & Recommendations:   65 year old Caucasian male with hypertension, uncontrolled type 2 DM, s/p Rt transtibial amputation (2019) for osteomyelitis, nonischemic cardiomyopathy, CKD stage 3-4.  Nonischemic cardiomyopathy: NYHA class I-II. EF improved to 45-50% in 10/2019.  Currently on metoprolol succinate to 37.5 mg daily, spironolactone to 25 mg daily, Entresto 97-103 mg bid, corlanor 5 mg bid. Reduce metoprolol to 25 mg daily for ease of administration.  Hypertension: Controlled.  Type 2 DM: Continue follow up with PCP. Now on Jardiance.   H/o osetomyelitis: S/p right transtibial amputation Walking well with prosthetic leg.  CKD stage 3: Stable  F/u in 1 year.   Nigel Mormon, MD [facility] Pager: [phone] Office: [phone] If no answer Cell [phone]

## 2020-12-22 DIAGNOSIS — E1169 Type 2 diabetes mellitus with other specified complication: Secondary | ICD-10-CM | POA: Diagnosis not present

## 2020-12-25 DIAGNOSIS — Z89511 Acquired absence of right leg below knee: Secondary | ICD-10-CM | POA: Diagnosis not present

## 2020-12-25 DIAGNOSIS — E7800 Pure hypercholesterolemia, unspecified: Secondary | ICD-10-CM | POA: Diagnosis not present

## 2020-12-25 DIAGNOSIS — D729 Disorder of white blood cells, unspecified: Secondary | ICD-10-CM | POA: Diagnosis not present

## 2020-12-25 DIAGNOSIS — G546 Phantom limb syndrome with pain: Secondary | ICD-10-CM | POA: Diagnosis not present

## 2020-12-25 DIAGNOSIS — Z0000 Encounter for general adult medical examination without abnormal findings: Secondary | ICD-10-CM | POA: Diagnosis not present

## 2020-12-25 DIAGNOSIS — E1169 Type 2 diabetes mellitus with other specified complication: Secondary | ICD-10-CM | POA: Diagnosis not present

## 2020-12-25 DIAGNOSIS — I10 Essential (primary) hypertension: Secondary | ICD-10-CM | POA: Diagnosis not present

## 2020-12-25 DIAGNOSIS — Z125 Encounter for screening for malignant neoplasm of prostate: Secondary | ICD-10-CM | POA: Diagnosis not present

## 2020-12-25 DIAGNOSIS — I420 Dilated cardiomyopathy: Secondary | ICD-10-CM | POA: Diagnosis not present

## 2020-12-25 DIAGNOSIS — B351 Tinea unguium: Secondary | ICD-10-CM | POA: Diagnosis not present

## 2020-12-31 ENCOUNTER — Other Ambulatory Visit: Payer: Self-pay

## 2020-12-31 ENCOUNTER — Encounter (INDEPENDENT_AMBULATORY_CARE_PROVIDER_SITE_OTHER): Payer: Self-pay | Admitting: Ophthalmology

## 2020-12-31 ENCOUNTER — Ambulatory Visit (INDEPENDENT_AMBULATORY_CARE_PROVIDER_SITE_OTHER): Payer: PPO | Admitting: Ophthalmology

## 2020-12-31 DIAGNOSIS — E113411 Type 2 diabetes mellitus with severe nonproliferative diabetic retinopathy with macular edema, right eye: Secondary | ICD-10-CM

## 2020-12-31 DIAGNOSIS — E113412 Type 2 diabetes mellitus with severe nonproliferative diabetic retinopathy with macular edema, left eye: Secondary | ICD-10-CM | POA: Diagnosis not present

## 2020-12-31 MED ORDER — AFLIBERCEPT 2MG/0.05ML IZ SOLN FOR KALEIDOSCOPE
2.0000 mg | INTRAVITREAL | Status: AC | PRN
  Administered 2020-12-31: 2 mg via INTRAVITREAL

## 2020-12-31 NOTE — Assessment & Plan Note (Signed)
Follow-up as scheduled OS improving  OS may also need peripheral anterior PRP

## 2020-12-31 NOTE — Assessment & Plan Note (Signed)
At 13-week follow-up today continued improvement in CSME center involved yet still residual disease.  Repeat Eylea today  Patient has not had peripheral anterior PRP and likely needs it because of the extensive widespread peripheral nonperfusion and overwhelming peripheral nonperfusion in the anterior leg which led to the large vegF load only now controlled.  Will deliver peripheral anterior PRP in the future so as to decrease treatment burden and long standing control of disease

## 2020-12-31 NOTE — Progress Notes (Signed)
12/31/2020     CHIEF COMPLAINT Patient presents for Retina Follow Up (13 Week F/U OD, poss Eylea OD//Pt denies noticeable changes to [place] OU since last visit. Pt denies ocular pain, flashes of light, or floaters OU. //)   HISTORY OF PRESENT ILLNESS: Brian Herring is a 65 y.o. male who presents to the clinic today for:   HPI     Retina Follow Up           Diagnosis: Diabetic Retinopathy   Laterality: right eye   Onset: 13 weeks ago   Severity: mild   Duration: 13 weeks   Course: stable   Comments: 13 Week F/U OD, poss Eylea OD  Pt denies noticeable changes to [place] OU since last visit. Pt denies ocular pain, flashes of light, or floaters OU.          Last edited by Rockie Neighbours, [place] on 12/31/2020 10:09 AM.      Referring physician: [name], MD [place],  [place] [id number]  HISTORICAL INFORMATION:   Selected notes from the MEDICAL RECORD NUMBER    Lab Results  Component Value Date   HGBA1C 7.0 (H) 03/12/2018     CURRENT MEDICATIONS: No current outpatient medications on file. (Ophthalmic Drugs)   No current facility-administered medications for this visit. (Ophthalmic Drugs)   Current Outpatient Medications (Other)  Medication Sig   blood glucose meter kit and supplies KIT Dispense based on patient and insurance preference. Use up to four times daily as directed. (FOR ICD-10: E 11.65).   furosemide (LASIX) 40 MG tablet TAKE ONE TABLET BY MOUTH ONCE DAILY   glipiZIDE (GLUCOTROL XL) 5 MG 24 hr tablet Take 5 mg by mouth daily.   ivabradine ([name]) 5 MG TABS tablet Take 1 tablet (5 mg total) by mouth 2 (two) times daily with a meal.   JARDIANCE 25 MG TABS tablet Take 1 tablet by mouth daily.   Lancets (ONETOUCH DELICA PLUS [id number]) MISC Use to test blood sugar daily   metFORMIN (GLUCOPHAGE-XR) 500 MG 24 hr tablet Take 500 mg by mouth 2 (two) times daily.   metoprolol succinate (TOPROL-XL) 25 MG 24 hr tablet Take 1 tablet (25 mg total)  by mouth daily.   mupirocin ointment (BACTROBAN) 2 % Apply to affected area three times daily for 5 days   ONE TOUCH ULTRA TEST test strip Use to test blood sugar daily   rosuvastatin (CRESTOR) 10 MG tablet Take 1 tablet (10 mg total) by mouth daily.   sacubitril-valsartan (ENTRESTO) 97-103 MG Take 1 tablet by mouth 2 (two) times daily.   spironolactone (ALDACTONE) 25 MG tablet TAKE ONE TABLET BY MOUTH ONCE DAILY Needs appointment for further refills   No current facility-administered medications for this visit. (Other)      REVIEW OF SYSTEMS:    ALLERGIES Allergies  Allergen Reactions   Elemental Sulfur Rash   Norvasc [Amlodipine Besylate] Rash   Sulfa Antibiotics Rash    PAST MEDICAL HISTORY Past Medical History:  Diagnosis Date   Arthritis    shoulder   Cancer ([place])    skin cancer on nose   Cardiomyopathy ([place])    Chronic systolic CHF (congestive heart failure) ([place])    Diabetes mellitus without complication ([place])    Hyperlipidemia    Hypertension    Past Surgical History:  Procedure Laterality Date   AMPUTATION Right 10/14/2017   Procedure: AMPUTATION  GREAT TOE;  Surgeon: [name], MD;  Location: [facility]  OR;  Service: Orthopedics;  Laterality: Right;   AMPUTATION Right 10/18/2017   Procedure: RIGHT BELOW KNEE AMPUTATION;  Surgeon: [name], MD;  Location: [facility];  Service: Orthopedics;  Laterality: Right;   CYSTOSCOPY WITH INSERTION OF UROLIFT N/A 03/15/2018   Procedure: CYSTOSCOPY WITH INSERTION OF UROLIFT;  Surgeon: Franchot Gallo, MD;  Location: [facility];  Service: Urology;  Laterality: N/A;   SHOULDER SURGERY  01/05   STUMP REVISION Right 12/15/2017   Procedure: REVISION RIGHT BELOW KNEE AMPUTATION;  Surgeon: [name], MD;  Location: [facility];  Service: Orthopedics;  Laterality: Right;    FAMILY HISTORY Family History  Problem Relation Age of Onset   Pneumonia Mother        aspiration    SOCIAL HISTORY Social History   Tobacco Use   Smoking  status: Never   Smokeless tobacco: Never  Vaping Use   Vaping Use: Never used  Substance Use Topics   Alcohol use: No    Alcohol/week: 0.0 standard drinks   Drug use: Never         OPHTHALMIC EXAM:  Base Eye Exam     Visual Acuity (ETDRS)       Right Left   Dist cc 20/40 +2 20/70 +2   Dist ph cc 20/30 -2 20/60 +1    Correction: Glasses         Tonometry (Tonopen, 10:17 AM)       Right Left   Pressure 09 08         Pupils       Pupils Dark Light Shape React APD   Right PERRL 5 4 Round Brisk None   Left PERRL 5 4 Round Brisk None         Visual Fields (Counting fingers)       Left Right    Full Full         Extraocular Movement       Right Left    Full Full         Neuro/Psych     Oriented x3: Yes   Mood/Affect: Normal         Dilation     Right eye: 1.0% Mydriacyl, 2.5% Phenylephrine @ 10:17 AM           Slit Lamp and Fundus Exam     External Exam       Right Left   External Normal Normal         Slit Lamp Exam       Right Left   Lids/Lashes Normal Normal   Conjunctiva/Sclera White and quiet White and quiet   Cornea Clear Clear   Anterior Chamber Deep and quiet Deep and quiet   Iris Round and reactive Round and reactive   Lens 2+ Nuclear sclerosis, Cortical spokes 2+ 2+ Nuclear sclerosis   Anterior Vitreous Normal Normal         Fundus Exam       Right Left   Posterior Vitreous Posterior vitreous detachment    Disc Normal    C/D Ratio 0.2    Macula Hemorrhage, Microaneurysms, no clinically significant macular edema, no macular thickening    Vessels Severe nonproliferative diabetic retinopathy    Periphery Normal, moderate scatter PRP nasal.  Room temporally and superiorly for additional laser             IMAGING AND PROCEDURES  Imaging and Procedures for 12/31/20  OCT, Retina - OU - Both Eyes  Right Eye Quality was good. Scan locations included subfoveal. Central Foveal Thickness: 287.  Progression has improved. Findings include abnormal foveal contour, cystoid macular edema.   Left Eye Scan locations included subfoveal. Central Foveal Thickness: 313. Progression has improved. Findings include abnormal foveal contour, cystoid macular edema.   Notes Chronic CSME CME OU, overall vastly improved as compared to onset, decreased and stable at 13-week follow-up OD.  And decreased CSME left eye 10-week follow-up.          Intravitreal Injection, Pharmacologic Agent - OD - Right Eye       Time Out 12/31/2020. 10:45 AM. Confirmed correct patient, procedure, site, and patient consented.   Anesthesia Topical anesthesia was used. Anesthetic medications included [place] 3.5%.   Procedure Preparation included Tobramycin 0.3%, 10% betadine to eyelids, 5% betadine to ocular surface. A 30 gauge needle was used.   Injection: 2 mg aflibercept 2 MG/0.05ML   Route: Intravitreal   NDC: 43154-008-67, Lot: [id number], Waste: 0 mL   Post-op Post injection exam found visual acuity of at least counting fingers. The patient tolerated the procedure well. There were no complications. The patient received written and verbal post procedure care education. Post injection medications were not given.              ASSESSMENT/PLAN:  Severe nonproliferative diabetic retinopathy of right eye, with macular edema, associated with type 2 diabetes mellitus ([place]) At 13-week follow-up today continued improvement in CSME center involved yet still residual disease.  Repeat Eylea today  Patient has not had peripheral anterior PRP and likely needs it because of the extensive widespread peripheral nonperfusion and overwhelming peripheral nonperfusion in the anterior leg which led to the large vegF load only now controlled.  Will deliver peripheral anterior PRP in the future so as to decrease treatment burden and long standing control of disease  Severe nonproliferative diabetic retinopathy of left eye,  with macular edema, associated with type 2 diabetes mellitus ([place]) Follow-up as scheduled OS improving  OS may also need peripheral anterior PRP     ICD-10-CM   1. Severe nonproliferative diabetic retinopathy of right eye, with macular edema, associated with type 2 diabetes mellitus (HCC)  E11.3411 OCT, Retina - OU - Both Eyes    Intravitreal Injection, Pharmacologic Agent - OD - Right Eye    aflibercept (EYLEA) SOLN 2 mg    2. Severe nonproliferative diabetic retinopathy of left eye, with macular edema, associated with type 2 diabetes mellitus ([place])  T24.5809       1.  We will deliver intravitreal Eylea today to maintain improved CSME and improve visual functioning.  Will need peripheral anterior PRP schedule PRP right eye in 4 to 5 weeks  2.  OS next as scheduled in approximately 3 weeks likely injection OS to maintain improved CSME  3.  Ophthalmic Meds Ordered this visit:  Meds ordered this encounter  Medications   aflibercept (EYLEA) SOLN 2 mg       Return in about 4 weeks (around 01/28/2021) for dilate, OD, PRP, COLOR FP, and follow-up OS as scheduled.  There are no Patient Instructions on file for this visit.   Explained the diagnoses, plan, and follow up with the patient and they expressed understanding.  Patient expressed understanding of the importance of proper follow up care.   Clent Demark Siniya Lichty M.D. Diseases & Surgery of the Retina and Vitreous Retina & Diabetic [place] 12/31/20     Abbreviations: M myopia (nearsighted); A astigmatism; H hyperopia (farsighted); P  presbyopia; Mrx spectacle prescription;  CTL contact lenses; OD right eye; OS left eye; OU both eyes  XT exotropia; ET esotropia; PEK punctate epithelial keratitis; PEE punctate epithelial erosions; DES dry eye syndrome; MGD meibomian gland dysfunction; ATs artificial tears; PFAT's preservative free artificial tears; [place] nuclear sclerotic cataract; PSC posterior subcapsular cataract; ERM epi-retinal  membrane; PVD posterior vitreous detachment; RD retinal detachment; DM diabetes mellitus; DR diabetic retinopathy; NPDR non-proliferative diabetic retinopathy; PDR proliferative diabetic retinopathy; CSME clinically significant macular edema; DME diabetic macular edema; dbh dot blot hemorrhages; CWS cotton wool spot; POAG primary open angle glaucoma; C/D cup-to-disc ratio; HVF humphrey visual field; GVF goldmann visual field; OCT optical coherence tomography; IOP intraocular pressure; BRVO Branch retinal vein occlusion; CRVO central retinal vein occlusion; CRAO central retinal artery occlusion; BRAO branch retinal artery occlusion; RT retinal tear; SB scleral buckle; PPV pars plana vitrectomy; VH Vitreous hemorrhage; PRP panretinal laser photocoagulation; IVK intravitreal kenalog; VMT vitreomacular traction; MH Macular hole;  NVD neovascularization of the disc; NVE neovascularization elsewhere; AREDS age related eye disease study; ARMD age related macular degeneration; POAG primary open angle glaucoma; EBMD epithelial/anterior basement membrane dystrophy; ACIOL anterior chamber intraocular lens; IOL intraocular lens; PCIOL posterior chamber intraocular lens; Phaco/IOL phacoemulsification with intraocular lens placement; [place] photorefractive keratectomy; LASIK laser assisted in situ keratomileusis; HTN hypertension; DM diabetes mellitus; COPD chronic obstructive pulmonary disease

## 2021-01-01 DIAGNOSIS — Z1389 Encounter for screening for other disorder: Secondary | ICD-10-CM | POA: Diagnosis not present

## 2021-01-01 DIAGNOSIS — Z0000 Encounter for general adult medical examination without abnormal findings: Secondary | ICD-10-CM | POA: Diagnosis not present

## 2021-01-05 DIAGNOSIS — N289 Disorder of kidney and ureter, unspecified: Secondary | ICD-10-CM | POA: Diagnosis not present

## 2021-01-05 DIAGNOSIS — D729 Disorder of white blood cells, unspecified: Secondary | ICD-10-CM | POA: Diagnosis not present

## 2021-01-05 DIAGNOSIS — Z23 Encounter for immunization: Secondary | ICD-10-CM | POA: Diagnosis not present

## 2021-01-05 DIAGNOSIS — G546 Phantom limb syndrome with pain: Secondary | ICD-10-CM | POA: Diagnosis not present

## 2021-01-05 DIAGNOSIS — G479 Sleep disorder, unspecified: Secondary | ICD-10-CM | POA: Diagnosis not present

## 2021-01-05 DIAGNOSIS — Z0000 Encounter for general adult medical examination without abnormal findings: Secondary | ICD-10-CM | POA: Diagnosis not present

## 2021-01-05 DIAGNOSIS — I420 Dilated cardiomyopathy: Secondary | ICD-10-CM | POA: Diagnosis not present

## 2021-01-05 DIAGNOSIS — E7800 Pure hypercholesterolemia, unspecified: Secondary | ICD-10-CM | POA: Diagnosis not present

## 2021-01-05 DIAGNOSIS — I10 Essential (primary) hypertension: Secondary | ICD-10-CM | POA: Diagnosis not present

## 2021-01-05 DIAGNOSIS — Z125 Encounter for screening for malignant neoplasm of prostate: Secondary | ICD-10-CM | POA: Diagnosis not present

## 2021-01-05 DIAGNOSIS — E113413 Type 2 diabetes mellitus with severe nonproliferative diabetic retinopathy with macular edema, bilateral: Secondary | ICD-10-CM | POA: Diagnosis not present

## 2021-01-05 DIAGNOSIS — Z89511 Acquired absence of right leg below knee: Secondary | ICD-10-CM | POA: Diagnosis not present

## 2021-01-20 DIAGNOSIS — E113413 Type 2 diabetes mellitus with severe nonproliferative diabetic retinopathy with macular edema, bilateral: Secondary | ICD-10-CM | POA: Diagnosis not present

## 2021-01-21 ENCOUNTER — Encounter (INDEPENDENT_AMBULATORY_CARE_PROVIDER_SITE_OTHER): Payer: Self-pay | Admitting: Ophthalmology

## 2021-01-21 ENCOUNTER — Ambulatory Visit (INDEPENDENT_AMBULATORY_CARE_PROVIDER_SITE_OTHER): Payer: PPO | Admitting: Ophthalmology

## 2021-01-21 ENCOUNTER — Other Ambulatory Visit: Payer: Self-pay

## 2021-01-21 DIAGNOSIS — H2511 Age-related nuclear cataract, right eye: Secondary | ICD-10-CM | POA: Diagnosis not present

## 2021-01-21 DIAGNOSIS — H2512 Age-related nuclear cataract, left eye: Secondary | ICD-10-CM | POA: Diagnosis not present

## 2021-01-21 DIAGNOSIS — E113412 Type 2 diabetes mellitus with severe nonproliferative diabetic retinopathy with macular edema, left eye: Secondary | ICD-10-CM | POA: Diagnosis not present

## 2021-01-21 DIAGNOSIS — E113411 Type 2 diabetes mellitus with severe nonproliferative diabetic retinopathy with macular edema, right eye: Secondary | ICD-10-CM

## 2021-01-21 MED ORDER — AFLIBERCEPT 2MG/0.05ML IZ SOLN FOR KALEIDOSCOPE
2.0000 mg | INTRAVITREAL | Status: AC | PRN
  Administered 2021-01-21: 2 mg via INTRAVITREAL

## 2021-01-21 NOTE — Assessment & Plan Note (Signed)
I recommend follow-up and evaluation for cataract surgery OS and OD to allow for enhanced visual acuity as well as medical monitoring of macular condition

## 2021-01-21 NOTE — Assessment & Plan Note (Addendum)
Repeat intravitreal Eylea OS today as dramatic improvements over time and CSME.  Stabilization acuity now vision limited by minor residual CME yet mostly cataract OS

## 2021-01-21 NOTE — Progress Notes (Signed)
01/21/2021     CHIEF COMPLAINT Patient presents for Retina Follow Up (13 Wk F/U OS, poss Eylea OS//Pt denies noticeable changes to [place] OU since last visit. Pt denies ocular pain, flashes of light, or floaters OU. /A1c: 6.9/)   HISTORY OF PRESENT ILLNESS: CHARLE Herring is a 64 y.o. male who presents to the clinic today for:   HPI     Retina Follow Up           Diagnosis: Diabetic Retinopathy   Laterality: left eye   Onset: 13 weeks ago   Severity: mild   Duration: 13 weeks   Course: stable   Comments: 13 Wk F/U OS, poss Eylea OS  Pt denies noticeable changes to [place] OU since last visit. Pt denies ocular pain, flashes of light, or floaters OU.  A1c: 6.9        Last edited by Brian Herring, Brian Herring on 01/21/2021 10:27 AM.      Referring physician: [name], MD [place],  [place] [address]  HISTORICAL INFORMATION:   Selected notes from the MEDICAL RECORD NUMBER    Lab Results  Component Value Date   HGBA1C 7.0 (H) 03/12/2018     CURRENT MEDICATIONS: No current outpatient medications on file. (Ophthalmic Drugs)   No current facility-administered medications for this visit. (Ophthalmic Drugs)   Current Outpatient Medications (Other)  Medication Sig   blood glucose meter kit and supplies KIT Dispense based on patient and insurance preference. Use up to four times daily as directed. (FOR ICD-10: E 11.65).   furosemide (LASIX) 40 MG tablet TAKE ONE TABLET BY MOUTH ONCE DAILY   glipiZIDE (GLUCOTROL XL) 5 MG 24 hr tablet Take 5 mg by mouth daily.   ivabradine ([name]) 5 MG TABS tablet Take 1 tablet (5 mg total) by mouth 2 (two) times daily with a meal.   JARDIANCE 25 MG TABS tablet Take 1 tablet by mouth daily.   Lancets (ONETOUCH DELICA PLUS [id number]) MISC Use to test blood sugar daily   metFORMIN (GLUCOPHAGE-XR) 500 MG 24 hr tablet Take 500 mg by mouth 2 (two) times daily.   metoprolol succinate (TOPROL-XL) 25 MG 24 hr tablet Take 1 tablet  (25 mg total) by mouth daily.   mupirocin ointment (BACTROBAN) 2 % Apply to affected area three times daily for 5 days   ONE TOUCH ULTRA TEST test strip Use to test blood sugar daily   rosuvastatin (CRESTOR) 10 MG tablet Take 1 tablet (10 mg total) by mouth daily.   sacubitril-valsartan (ENTRESTO) 97-103 MG Take 1 tablet by mouth 2 (two) times daily.   spironolactone (ALDACTONE) 25 MG tablet TAKE ONE TABLET BY MOUTH ONCE DAILY Needs appointment for further refills   No current facility-administered medications for this visit. (Other)      REVIEW OF SYSTEMS:    ALLERGIES Allergies  Allergen Reactions   Elemental Sulfur Rash   Norvasc [Amlodipine Besylate] Rash   Sulfa Antibiotics Rash    PAST MEDICAL HISTORY Past Medical History:  Diagnosis Date   Arthritis    shoulder   Cancer ([place])    skin cancer on nose   Cardiomyopathy ([place])    Chronic systolic CHF (congestive heart failure) ([place])    Diabetes mellitus without complication ([place])    Hyperlipidemia    Hypertension    Past Surgical History:  Procedure Laterality Date   AMPUTATION Right 10/14/2017   Procedure: AMPUTATION  GREAT TOE;  Surgeon: Brian Minion, MD;  Location: [facility];  Service: Orthopedics;  Laterality: Right;   AMPUTATION Right 10/18/2017   Procedure: RIGHT BELOW KNEE AMPUTATION;  Surgeon: Brian Minion, MD;  Location: [facility];  Service: Orthopedics;  Laterality: Right;   CYSTOSCOPY WITH INSERTION OF UROLIFT N/A 03/15/2018   Procedure: CYSTOSCOPY WITH INSERTION OF UROLIFT;  Surgeon: Brian Gallo, MD;  [facility];  Service: Urology;  Laterality: N/A;   SHOULDER SURGERY  01/05   STUMP REVISION Right 12/15/2017   Procedure: REVISION RIGHT BELOW KNEE AMPUTATION;  Surgeon: Brian Minion, MD;  Location: [facility];  Service: Orthopedics;  Laterality: Right;    FAMILY HISTORY Family History  Problem Relation Age of Onset   Pneumonia Mother        aspiration    SOCIAL HISTORY Social History   Tobacco  Use   Smoking status: Never   Smokeless tobacco: Never  Vaping Use   Vaping Use: Never used  Substance Use Topics   Alcohol use: No    Alcohol/week: 0.0 standard drinks   Drug use: Never         OPHTHALMIC EXAM:  Base Eye Exam     Visual Acuity (ETDRS)       Right Left   Dist cc 20/30 -2 20/60 +2   Dist ph cc 20/30 +2 20/50 +1    Correction: Glasses         Tonometry (Tonopen, 10:31 AM)       Right Left   Pressure 12 12         Pupils       Pupils Dark Light Shape React APD   Right PERRL 5 4 Round Brisk None   Left PERRL 5 4 Round Brisk None         Visual Fields (Counting fingers)       Left Right    Full Full         Extraocular Movement       Right Left    Full Full         Neuro/Psych     Oriented x3: Yes   Mood/Affect: Normal         Dilation     Left eye: 1.0% Mydriacyl, 2.5% Phenylephrine @ 10:31 AM           Slit Lamp and Fundus Exam     External Exam       Right Left   External Normal Normal         Slit Lamp Exam       Right Left   Lids/Lashes Normal Normal   Conjunctiva/Sclera White and quiet White and quiet   Cornea Herring Herring   Anterior Chamber Deep and quiet Deep and quiet   Iris Round and reactive Round and reactive   Lens 2+ Nuclear sclerosis, Cortical spokes 2+ 2+ Nuclear sclerosis   Anterior Vitreous Normal Normal         Fundus Exam       Right Left   Posterior Vitreous  Normal   Disc  Normal   C/D Ratio  0.35   Macula  Clinically significant macular edema, Mild clinically significant macular edema   Vessels  Severe NPDR   Periphery  Moderate scatter pattern laser, peripherally, room temporally for more laser            IMAGING AND PROCEDURES  Imaging and Procedures for 01/21/21  OCT, Retina - OU - Both Eyes       Right Eye  Quality was good. Scan locations included subfoveal. Central Foveal Thickness: 304. Progression has improved. Findings include abnormal foveal contour,  cystoid macular edema.   Left Eye Scan locations included subfoveal. Central Foveal Thickness: 319. Progression has improved. Findings include abnormal foveal contour, cystoid macular edema.   Notes Chronic CSME CME OU, overall improved as compared to onset, decreased and stable at 3-week follow-up OD.  And decreased CSME left eye 13-week follow-up.   Repeat injection OS today  Bilateral medial opacity               ASSESSMENT/PLAN:  Severe nonproliferative diabetic retinopathy of left eye, with macular edema, associated with type 2 diabetes mellitus ([place]) Repeat intravitreal Eylea OS today as dramatic improvements over time and CSME.  Stabilization acuity now vision limited by minor residual CME yet mostly cataract OS  Nuclear sclerotic cataract of left eye I recommend follow-up and evaluation for cataract surgery OS and OD to allow for enhanced visual acuity as well as medical monitoring of macular condition  Nuclear sclerotic cataract of right eye I recommend follow-up and evaluation for cataract surgery OS and OD to allow for enhanced visual acuity as well as medical monitoring of macular condition  Severe nonproliferative diabetic retinopathy of right eye, with macular edema, associated with type 2 diabetes mellitus ([place]) Follow-up as scheduled     ICD-10-CM   1. Severe nonproliferative diabetic retinopathy of left eye, with macular edema, associated with type 2 diabetes mellitus (HCC)  Z61.0960 OCT, Retina - OU - Both Eyes    2. Nuclear sclerotic cataract of left eye  H25.12     3. Nuclear sclerotic cataract of right eye  H25.11     4. Severe nonproliferative diabetic retinopathy of right eye, with macular edema, associated with type 2 diabetes mellitus ([place])  E11.3411       1.  2.  3.  Ophthalmic Meds Ordered this visit:  No orders of the defined types were placed in this encounter.      Return in about 13 weeks (around 04/22/2021) for dilate, OS, EYLEA  OCT,, and follow-up OD as scheduled.  There are no Patient Instructions on file for this visit.   Explained the diagnoses, plan, and follow up with the patient and they expressed understanding.  Patient expressed understanding of the importance of proper follow up care.   Clent Demark Jerney Baksh M.D. Diseases & Surgery of the Retina and Vitreous Retina & Diabetic [place] 01/21/21     Abbreviations: M myopia (nearsighted); A astigmatism; H hyperopia (farsighted); P presbyopia; Mrx spectacle prescription;  CTL contact lenses; OD right eye; OS left eye; OU both eyes  XT exotropia; ET esotropia; PEK punctate epithelial keratitis; PEE punctate epithelial erosions; DES dry eye syndrome; MGD meibomian gland dysfunction; ATs artificial tears; PFAT's preservative free artificial tears; [place] nuclear sclerotic cataract; PSC posterior subcapsular cataract; ERM epi-retinal membrane; PVD posterior vitreous detachment; RD retinal detachment; DM diabetes mellitus; DR diabetic retinopathy; NPDR non-proliferative diabetic retinopathy; PDR proliferative diabetic retinopathy; CSME clinically significant macular edema; DME diabetic macular edema; dbh dot blot hemorrhages; CWS cotton wool spot; POAG primary open angle glaucoma; C/D cup-to-disc ratio; HVF humphrey visual field; GVF goldmann visual field; OCT optical coherence tomography; IOP intraocular pressure; BRVO Branch retinal vein occlusion; CRVO central retinal vein occlusion; CRAO central retinal artery occlusion; BRAO branch retinal artery occlusion; RT retinal tear; SB scleral buckle; PPV pars plana vitrectomy; VH Vitreous hemorrhage; PRP panretinal laser photocoagulation; IVK intravitreal kenalog; VMT vitreomacular traction; MH Macular hole;  NVD neovascularization of the disc; NVE neovascularization elsewhere; AREDS age related eye disease study; ARMD age related macular degeneration; POAG primary open angle glaucoma; EBMD epithelial/anterior basement membrane  dystrophy; ACIOL anterior chamber intraocular lens; IOL intraocular lens; PCIOL posterior chamber intraocular lens; Phaco/IOL phacoemulsification with intraocular lens placement; [place] photorefractive keratectomy; LASIK laser assisted in situ keratomileusis; HTN hypertension; DM diabetes mellitus; COPD chronic obstructive pulmonary disease

## 2021-01-21 NOTE — Assessment & Plan Note (Signed)
Follow up as scheduled.  

## 2021-01-28 ENCOUNTER — Encounter (INDEPENDENT_AMBULATORY_CARE_PROVIDER_SITE_OTHER): Payer: Self-pay | Admitting: Ophthalmology

## 2021-01-28 ENCOUNTER — Other Ambulatory Visit: Payer: Self-pay

## 2021-01-28 ENCOUNTER — Ambulatory Visit (INDEPENDENT_AMBULATORY_CARE_PROVIDER_SITE_OTHER): Payer: PPO | Admitting: Ophthalmology

## 2021-01-28 ENCOUNTER — Encounter (INDEPENDENT_AMBULATORY_CARE_PROVIDER_SITE_OTHER): Payer: PPO | Admitting: Ophthalmology

## 2021-01-28 DIAGNOSIS — E113411 Type 2 diabetes mellitus with severe nonproliferative diabetic retinopathy with macular edema, right eye: Secondary | ICD-10-CM | POA: Diagnosis not present

## 2021-01-28 NOTE — Assessment & Plan Note (Addendum)
Additional PRP delivered superiorly and to decrease vegF burden

## 2021-01-28 NOTE — Progress Notes (Signed)
01/28/2021     CHIEF COMPLAINT Patient presents for Retina Follow Up (4 week PRP OD and FP/Pt states VA OU stable since last visit. Pt denies FOL, floaters, or ocular pain OU. Brian Herring: 6.9/LBS: 131)   HISTORY OF PRESENT ILLNESS: Brian Herring is a 65 y.o. male who presents to the clinic today for:   HPI     Retina Follow Up           Diagnosis: Diabetic Retinopathy   Laterality: right eye   Onset: 4 weeks ago   Severity: mild   Duration: 4 weeks   Course: stable   Comments: 4 week PRP OD and FP Pt states VA OU stable since last visit. Pt denies FOL, floaters, or ocular pain OU.  A1C: 6.9 LBS: 131       Last edited by Kendra Opitz, COA on 01/28/2021  9:47 AM.      Referring physician: [name], MD [place],  [place] [address]  HISTORICAL INFORMATION:   Selected notes from the MEDICAL RECORD NUMBER    Lab Results  Component Value Date   HGBA1C 7.0 (H) 03/12/2018     CURRENT MEDICATIONS: No current outpatient medications on file. (Ophthalmic Drugs)   No current facility-administered medications for this visit. (Ophthalmic Drugs)   Current Outpatient Medications (Other)  Medication Sig   blood glucose meter kit and supplies KIT Dispense based on patient and insurance preference. Use up to four times daily as directed. (FOR ICD-10: E 11.65).   furosemide (LASIX) 40 MG tablet TAKE ONE TABLET BY MOUTH ONCE DAILY   glipiZIDE (GLUCOTROL XL) 5 MG 24 hr tablet Take 5 mg by mouth daily.   ivabradine ([name]) 5 MG TABS tablet Take 1 tablet (5 mg total) by mouth 2 (two) times daily with a meal.   JARDIANCE 25 MG TABS tablet Take 1 tablet by mouth daily.   Lancets (ONETOUCH DELICA PLUS [id number]) MISC Use to test blood sugar daily   metFORMIN (GLUCOPHAGE-XR) 500 MG 24 hr tablet Take 500 mg by mouth 2 (two) times daily.   metoprolol succinate (TOPROL-XL) 25 MG 24 hr tablet Take 1 tablet (25 mg total) by mouth daily.   mupirocin ointment  (BACTROBAN) 2 % Apply to affected area three times daily for 5 days   ONE TOUCH ULTRA TEST test strip Use to test blood sugar daily   rosuvastatin (CRESTOR) 10 MG tablet Take 1 tablet (10 mg total) by mouth daily.   sacubitril-valsartan (ENTRESTO) 97-103 MG Take 1 tablet by mouth 2 (two) times daily.   spironolactone (ALDACTONE) 25 MG tablet TAKE ONE TABLET BY MOUTH ONCE DAILY Needs appointment for further refills   No current facility-administered medications for this visit. (Other)      REVIEW OF SYSTEMS:    ALLERGIES Allergies  Allergen Reactions   Elemental Sulfur Rash   Norvasc [Amlodipine Besylate] Rash   Sulfa Antibiotics Rash    PAST MEDICAL HISTORY Past Medical History:  Diagnosis Date   Arthritis    shoulder   Cancer ([place])    skin cancer on nose   Cardiomyopathy ([place])    Chronic systolic CHF (congestive heart failure) ([place])    Diabetes mellitus without complication ([place])    Hyperlipidemia    Hypertension    Past Surgical History:  Procedure Laterality Date   AMPUTATION Right 10/14/2017   Procedure: AMPUTATION  GREAT TOE;  Surgeon: [name], MD;  Location: [facility];  Service: Orthopedics;  Laterality: Right;   AMPUTATION Right 10/18/2017   Procedure: RIGHT BELOW KNEE AMPUTATION;  Surgeon: [name], MD;  Location: [facility];  Service: Orthopedics;  Laterality: Right;   CYSTOSCOPY WITH INSERTION OF UROLIFT N/A 03/15/2018   Procedure: CYSTOSCOPY WITH INSERTION OF UROLIFT;  Surgeon: Franchot Gallo, MD;  Location: [facility];  Service: Urology;  Laterality: N/A;   SHOULDER SURGERY  01/05   STUMP REVISION Right 12/15/2017   Procedure: REVISION RIGHT BELOW KNEE AMPUTATION;  Surgeon: [name], MD;  Location: [facility];  Service: Orthopedics;  Laterality: Right;    FAMILY HISTORY Family History  Problem Relation Age of Onset   Pneumonia Mother        aspiration    SOCIAL HISTORY Social History   Tobacco Use   Smoking status: Never   Smokeless tobacco:  Never  Vaping Use   Vaping Use: Never used  Substance Use Topics   Alcohol use: No    Alcohol/week: 0.0 standard drinks   Drug use: Never         OPHTHALMIC EXAM:  Base Eye Exam     Visual Acuity (ETDRS)       Right Left   Dist cc 20/40 -1 20/60   Dist ph cc NI 20/50 -1         Tonometry (Tonopen, 9:51 AM)       Right Left   Pressure 10 11         Pupils       Pupils Dark Light Shape React APD   Right PERRL 5 4 Round Brisk None   Left PERRL 5 4 Round Brisk None         Visual Fields (Counting fingers)       Left Right    Full Full         Extraocular Movement       Right Left    Full Full         Neuro/Psych     Oriented x3: Yes   Mood/Affect: Normal         Dilation     Right eye: 1.0% Mydriacyl, 2.5% Phenylephrine @ 9:51 AM           Slit Lamp and Fundus Exam     External Exam       Right Left   External Normal Normal         Slit Lamp Exam       Right Left   Lids/Lashes Normal Normal   Conjunctiva/Sclera White and quiet White and quiet   Cornea Clear Clear   Anterior Chamber Deep and quiet Deep and quiet   Iris Round and reactive Round and reactive   Lens 2+ Nuclear sclerosis, Cortical spokes 2+ 2+ Nuclear sclerosis   Anterior Vitreous Normal Normal         Fundus Exam       Right Left   Posterior Vitreous Posterior vitreous detachment    Disc Normal    C/D Ratio 0.2    Macula Hemorrhage, Microaneurysms, no clinically significant macular edema, no macular thickening    Vessels Severe nonproliferative diabetic retinopathy    Periphery Normal, moderate scatter PRP nasal.  Room temporally and superiorly for additional laser             IMAGING AND PROCEDURES  Imaging and Procedures for 01/28/21  Color Fundus Photography Optos - OU - Both Eyes       Right Eye Macula : edema, microaneurysms.  Left Eye Disc findings include normal observations. Macula : microaneurysms, edema.   Notes Severe  nonproliferative diabetic retinopathy OU, moderate scatter PRP OD.  Continue with room for additional PRP temporally as well as nasal and superonasal  Moderate scatter inferonasal OS.    OU CSME continues yet improve  Vitreous debris centrally OU.     Panretinal Photocoagulation - OD - Right Eye       Time Out Confirmed correct patient, procedure, site, and patient consented.   Anesthesia Topical anesthesia was used. Anesthetic medications included Proparacaine 0.5%.   Laser Information The type of laser was diode. Color was yellow. The duration in seconds was 0.03. The spot size was 390 microns. Laser power was 340. Total spots was 670.   Post-op The patient tolerated the procedure well. There were no complications. The patient received written and verbal post procedure care education.              ASSESSMENT/PLAN:  Severe nonproliferative diabetic retinopathy of right eye, with macular edema, associated with type 2 diabetes mellitus (HCC) Additional PRP delivered superiorly and to decrease vegF burden     ICD-10-CM   1. Severe nonproliferative diabetic retinopathy of right eye, with macular edema, associated with type 2 diabetes mellitus (HCC)  Z61.0960 Color Fundus Photography Optos - OU - Both Eyes    Panretinal Photocoagulation - OD - Right Eye      1.  OD with severe NPDR and CSME slowly improving on antivegF injections.  Last injection OD 4 weeks previous.  PRP will be delivered superiorly today to decrease overall vegF burden.  Follow-up OD and 4 more weeks for possible Eylea injection to control CSME  2.  OS follow-up as already scheduled  3.  Ophthalmic Meds Ordered this visit:  No orders of the defined types were placed in this encounter.      Return in about 4 weeks (around 02/25/2021) for dilate, OD, EYLEA OCT.  There are no Patient Instructions on file for this visit.   Explained the diagnoses, plan, and follow up with the patient and they  expressed understanding.  Patient expressed understanding of the importance of proper follow up care.   Clent Demark Rankin M.D. Diseases & Surgery of the Retina and Vitreous Retina & Diabetic [place] 01/28/21     Abbreviations: M myopia (nearsighted); A astigmatism; H hyperopia (farsighted); P presbyopia; Mrx spectacle prescription;  CTL contact lenses; OD right eye; OS left eye; OU both eyes  XT exotropia; ET esotropia; PEK punctate epithelial keratitis; PEE punctate epithelial erosions; DES dry eye syndrome; MGD meibomian gland dysfunction; ATs artificial tears; PFAT's preservative free artificial tears; [place] nuclear sclerotic cataract; PSC posterior subcapsular cataract; ERM epi-retinal membrane; PVD posterior vitreous detachment; RD retinal detachment; DM diabetes mellitus; DR diabetic retinopathy; NPDR non-proliferative diabetic retinopathy; PDR proliferative diabetic retinopathy; CSME clinically significant macular edema; DME diabetic macular edema; dbh dot blot hemorrhages; CWS cotton wool spot; POAG primary open angle glaucoma; C/D cup-to-disc ratio; HVF humphrey visual field; GVF goldmann visual field; OCT optical coherence tomography; IOP intraocular pressure; BRVO Branch retinal vein occlusion; CRVO central retinal vein occlusion; CRAO central retinal artery occlusion; BRAO branch retinal artery occlusion; RT retinal tear; SB scleral buckle; PPV pars plana vitrectomy; VH Vitreous hemorrhage; PRP panretinal laser photocoagulation; IVK intravitreal kenalog; VMT vitreomacular traction; MH Macular hole;  NVD neovascularization of the disc; NVE neovascularization elsewhere; AREDS age related eye disease study; ARMD age related macular degeneration; POAG primary open angle glaucoma; EBMD epithelial/anterior basement  membrane dystrophy; ACIOL anterior chamber intraocular lens; IOL intraocular lens; PCIOL posterior chamber intraocular lens; Phaco/IOL phacoemulsification with intraocular lens placement;  [place] photorefractive keratectomy; LASIK laser assisted in situ keratomileusis; HTN hypertension; DM diabetes mellitus; COPD chronic obstructive pulmonary disease

## 2021-02-18 ENCOUNTER — Other Ambulatory Visit: Payer: Self-pay | Admitting: Cardiology

## 2021-02-18 DIAGNOSIS — I428 Other cardiomyopathies: Secondary | ICD-10-CM

## 2021-02-18 DIAGNOSIS — E113413 Type 2 diabetes mellitus with severe nonproliferative diabetic retinopathy with macular edema, bilateral: Secondary | ICD-10-CM | POA: Diagnosis not present

## 2021-02-23 DIAGNOSIS — I10 Essential (primary) hypertension: Secondary | ICD-10-CM | POA: Diagnosis not present

## 2021-02-23 DIAGNOSIS — E7800 Pure hypercholesterolemia, unspecified: Secondary | ICD-10-CM | POA: Diagnosis not present

## 2021-02-23 DIAGNOSIS — E1169 Type 2 diabetes mellitus with other specified complication: Secondary | ICD-10-CM | POA: Diagnosis not present

## 2021-02-25 ENCOUNTER — Ambulatory Visit (INDEPENDENT_AMBULATORY_CARE_PROVIDER_SITE_OTHER): Payer: PPO | Admitting: Ophthalmology

## 2021-02-25 ENCOUNTER — Other Ambulatory Visit: Payer: Self-pay

## 2021-02-25 ENCOUNTER — Encounter (INDEPENDENT_AMBULATORY_CARE_PROVIDER_SITE_OTHER): Payer: Self-pay | Admitting: Ophthalmology

## 2021-02-25 DIAGNOSIS — H2511 Age-related nuclear cataract, right eye: Secondary | ICD-10-CM | POA: Diagnosis not present

## 2021-02-25 DIAGNOSIS — E113411 Type 2 diabetes mellitus with severe nonproliferative diabetic retinopathy with macular edema, right eye: Secondary | ICD-10-CM

## 2021-02-25 MED ORDER — AFLIBERCEPT 2MG/0.05ML IZ SOLN FOR KALEIDOSCOPE
2.0000 mg | INTRAVITREAL | Status: AC | PRN
  Administered 2021-02-25: 2 mg via INTRAVITREAL

## 2021-02-25 NOTE — Progress Notes (Signed)
02/25/2021     CHIEF COMPLAINT Patient presents for Retina Follow Up (8 week fu OD and Eylea OD/Pt states VA OU stable since last visit. Pt denies FOL, floaters, or ocular pain OU. /A1C:6.9/LBS:120/)   HISTORY OF PRESENT ILLNESS: Brian Herring is a 65 y.o. male who presents to the clinic today for:   HPI     Retina Follow Up           Diagnosis: Diabetic Retinopathy   Laterality: right eye   Onset: 8 weeks ago   Severity: mild   Duration: 8 weeks   Course: stable   Comments: 8 week fu OD and Eylea OD Pt states VA OU stable since last visit. Pt denies FOL, floaters, or ocular pain OU.  A1C:6.9 LBS:120        Last edited by Kendra Opitz, COA on 02/25/2021  9:22 AM.      Referring physician: [name], MD [place],  [place] [address]  HISTORICAL INFORMATION:   Selected notes from the MEDICAL RECORD NUMBER    Lab Results  Component Value Date   HGBA1C 7.0 (H) 03/12/2018     CURRENT MEDICATIONS: No current outpatient medications on file. (Ophthalmic Drugs)   No current facility-administered medications for this visit. (Ophthalmic Drugs)   Current Outpatient Medications (Other)  Medication Sig   blood glucose meter kit and supplies KIT Dispense based on patient and insurance preference. Use up to four times daily as directed. (FOR ICD-10: E 11.65).   furosemide (LASIX) 40 MG tablet TAKE ONE TABLET BY MOUTH DAILY   glipiZIDE (GLUCOTROL XL) 5 MG 24 hr tablet Take 5 mg by mouth daily.   ivabradine ([name]) 5 MG TABS tablet Take 1 tablet (5 mg total) by mouth 2 (two) times daily with a meal.   JARDIANCE 25 MG TABS tablet Take 1 tablet by mouth daily.   Lancets (ONETOUCH DELICA PLUS [id number]) MISC Use to test blood sugar daily   metFORMIN (GLUCOPHAGE-XR) 500 MG 24 hr tablet Take 500 mg by mouth 2 (two) times daily.   metoprolol succinate (TOPROL-XL) 25 MG 24 hr tablet TAKE 1 AND 1/2 TABLETS BY MOUTH ONCE DAILY   mupirocin ointment  (BACTROBAN) 2 % Apply to affected area three times daily for 5 days   ONE TOUCH ULTRA TEST test strip Use to test blood sugar daily   rosuvastatin (CRESTOR) 10 MG tablet Take 1 tablet (10 mg total) by mouth daily.   sacubitril-valsartan (ENTRESTO) 97-103 MG Take 1 tablet by mouth 2 (two) times daily.   spironolactone (ALDACTONE) 25 MG tablet TAKE ONE TABLET BY MOUTH ONCE DAILY Needs appointment for further refills   No current facility-administered medications for this visit. (Other)      REVIEW OF SYSTEMS:    ALLERGIES Allergies  Allergen Reactions   Elemental Sulfur Rash   Norvasc [Amlodipine Besylate] Rash   Sulfa Antibiotics Rash    PAST MEDICAL HISTORY Past Medical History:  Diagnosis Date   Arthritis    shoulder   Cancer ([place])    skin cancer on nose   Cardiomyopathy ([place])    Chronic systolic CHF (congestive heart failure) ([place])    Diabetes mellitus without complication ([place])    Hyperlipidemia    Hypertension    Past Surgical History:  Procedure Laterality Date   AMPUTATION Right 10/14/2017   Procedure: AMPUTATION  GREAT TOE;  Surgeon: [name], MD;  Location: [facility];  Service: Orthopedics;  Laterality: Right;  AMPUTATION Right 10/18/2017   Procedure: RIGHT BELOW KNEE AMPUTATION;  Surgeon: [name], MD;  Location: [facility];  Service: Orthopedics;  Laterality: Right;   CYSTOSCOPY WITH INSERTION OF UROLIFT N/A 03/15/2018   Procedure: CYSTOSCOPY WITH INSERTION OF UROLIFT;  Surgeon: Franchot Gallo, MD;  [facility];  Service: Urology;  Laterality: N/A;   SHOULDER SURGERY  01/05   STUMP REVISION Right 12/15/2017   Procedure: REVISION RIGHT BELOW KNEE AMPUTATION;  Surgeon: [name], MD;  Location: [facility];  Service: Orthopedics;  Laterality: Right;    FAMILY HISTORY Family History  Problem Relation Age of Onset   Pneumonia Mother        aspiration    SOCIAL HISTORY Social History   Tobacco Use   Smoking status: Never   Smokeless tobacco:  Never  Vaping Use   Vaping Use: Never used  Substance Use Topics   Alcohol use: No    Alcohol/week: 0.0 standard drinks   Drug use: Never         OPHTHALMIC EXAM:  Base Eye Exam     Visual Acuity (ETDRS)       Right Left   Dist [place] 20/60 20/60 -1   Dist ph [place] 20/40 -2 20/50    Correction: Glasses         Tonometry (Tonopen, 9:26 AM)       Right Left   Pressure 13 13         Pupils       Pupils Dark Light Shape React APD   Right PERRL 5 4 Round Brisk None   Left PERRL 5 4 Round Brisk None         Visual Fields (Counting fingers)       Left Right    Full Full         Extraocular Movement       Right Left    Full Full         Neuro/Psych     Oriented x3: Yes   Mood/Affect: Normal         Dilation     Right eye: 1.0% Mydriacyl, 2.5% Phenylephrine @ 9:26 AM           Slit Lamp and Fundus Exam     External Exam       Right Left   External Normal Normal         Slit Lamp Exam       Right Left   Lids/Lashes Normal Normal   Conjunctiva/Sclera White and quiet White and quiet   Cornea Clear Clear   Anterior Chamber Deep and quiet Deep and quiet   Iris Round and reactive Round and reactive   Lens 3+ Nuclear sclerosis, Cortical spokes 2+ 2+ Nuclear sclerosis   Anterior Vitreous Normal Normal         Fundus Exam       Right Left   Posterior Vitreous Posterior vitreous detachment    Disc Normal    C/D Ratio 0.2    Macula Hemorrhage, Microaneurysms, Mild clinically significant macular edema, Macular thickening    Vessels Severe nonproliferative diabetic retinopathy    Periphery Normal, moderate scatter PRP nasal.  Room temporally and superiorly for additional laser             IMAGING AND PROCEDURES  Imaging and Procedures for 02/25/21  OCT, Retina - OU - Both Eyes       Right Eye Quality was good. Scan locations included  subfoveal. Central Foveal Thickness: 295. Progression has improved. Findings include  abnormal foveal contour, cystoid macular edema.   Left Eye Scan locations included subfoveal. Central Foveal Thickness: 319. Progression has improved. Findings include abnormal foveal contour, cystoid macular edema.   Notes Chronic CSME CME OU, overall improved as compared to onset, decreased and stable at 8-week postinjection follow-up OD.  And decreased CSME left eye 5-week follow-up.   Repeat examination as scheduled  Bilateral media opacity       Intravitreal Injection, Pharmacologic Agent - OD - Right Eye       Time Out 02/25/2021. 9:50 AM. Confirmed correct patient, procedure, site, and patient consented.   Anesthesia Topical anesthesia was used. Anesthetic medications included [place] 3.5%.   Procedure Preparation included Tobramycin 0.3%, 10% betadine to eyelids, 5% betadine to ocular surface. A 30 gauge needle was used.   Injection: 2 mg aflibercept 2 MG/0.05ML   Route: Intravitreal, Site: Right Eye   NDC: [id number], Lot: [id number], Waste: 0 mL   Post-op Post injection exam found visual acuity of at least counting fingers. The patient tolerated the procedure well. There were no complications. The patient received written and verbal post procedure care education. Post injection medications were not given.              ASSESSMENT/PLAN:  Nuclear sclerotic cataract of right eye   , I do recommend cataract extraction intraocular displacement to allow for enhanced visual acuity but also medical monitoring of his posterior segment retinal conditions  Severe nonproliferative diabetic retinopathy of right eye, with macular edema, associated with type 2 diabetes mellitus (HCC) Chronic active CSME remains     ICD-10-CM   1. Severe nonproliferative diabetic retinopathy of right eye, with macular edema, associated with type 2 diabetes mellitus (HCC)  E11.3411 OCT, Retina - OU - Both Eyes    Intravitreal Injection, Pharmacologic Agent - OD - Right Eye    aflibercept  (EYLEA) SOLN 2 mg    2. Nuclear sclerotic cataract of right eye  H25.11       1.  OD today 4 weeks post most recent PRP session #2, and 8 weeks post most recent injection Eylea.  CSME continues slowly improving, likely benefiting from decrease vegF burden from peripheral anterior PRP Repeat injection intravitreal Eylea OD today and examination next extended interval 9 weeks  2.  Dilate OS as scheduled roughly 13 weeks from last examination which is roughly 9-week  3.  Ophthalmic Meds Ordered this visit:  Meds ordered this encounter  Medications   aflibercept (EYLEA) SOLN 2 mg       Return in about 9 weeks (around 04/29/2021) for dilate, OD, EYLEA OCT, and follow-up OS as scheduled.  There are no Patient Instructions on file for this visit.   Explained the diagnoses, plan, and follow up with the patient and they expressed understanding.  Patient expressed understanding of the importance of proper follow up care.   Clent Demark Kimberlynn Lumbra M.D. Diseases & Surgery of the Retina and Vitreous Retina & Diabetic [place] 02/25/21     Abbreviations: M myopia (nearsighted); A astigmatism; H hyperopia (farsighted); P presbyopia; Mrx spectacle prescription;  CTL contact lenses; OD right eye; OS left eye; OU both eyes  XT exotropia; ET esotropia; PEK punctate epithelial keratitis; PEE punctate epithelial erosions; DES dry eye syndrome; MGD meibomian gland dysfunction; ATs artificial tears; PFAT's preservative free artificial tears; [place] nuclear sclerotic cataract; PSC posterior subcapsular cataract; ERM epi-retinal membrane; PVD posterior vitreous detachment; RD retinal detachment;  DM diabetes mellitus; DR diabetic retinopathy; NPDR non-proliferative diabetic retinopathy; PDR proliferative diabetic retinopathy; CSME clinically significant macular edema; DME diabetic macular edema; dbh dot blot hemorrhages; CWS cotton wool spot; POAG primary open angle glaucoma; C/D cup-to-disc ratio; HVF humphrey  visual field; GVF goldmann visual field; OCT optical coherence tomography; IOP intraocular pressure; BRVO Branch retinal vein occlusion; CRVO central retinal vein occlusion; CRAO central retinal artery occlusion; BRAO branch retinal artery occlusion; RT retinal tear; SB scleral buckle; PPV pars plana vitrectomy; VH Vitreous hemorrhage; PRP panretinal laser photocoagulation; IVK intravitreal kenalog; VMT vitreomacular traction; MH Macular hole;  NVD neovascularization of the disc; NVE neovascularization elsewhere; AREDS age related eye disease study; ARMD age related macular degeneration; POAG primary open angle glaucoma; EBMD epithelial/anterior basement membrane dystrophy; ACIOL anterior chamber intraocular lens; IOL intraocular lens; PCIOL posterior chamber intraocular lens; Phaco/IOL phacoemulsification with intraocular lens placement; [place] photorefractive keratectomy; LASIK laser assisted in situ keratomileusis; HTN hypertension; DM diabetes mellitus; COPD chronic obstructive pulmonary disease

## 2021-02-25 NOTE — Assessment & Plan Note (Signed)
Chronic active CSME remains

## 2021-02-25 NOTE — Assessment & Plan Note (Signed)
 ,   I do recommend cataract extraction intraocular displacement to allow for enhanced visual acuity but also medical monitoring of his posterior segment retinal conditions

## 2021-03-23 DIAGNOSIS — E113413 Type 2 diabetes mellitus with severe nonproliferative diabetic retinopathy with macular edema, bilateral: Secondary | ICD-10-CM | POA: Diagnosis not present

## 2021-03-30 DIAGNOSIS — E7800 Pure hypercholesterolemia, unspecified: Secondary | ICD-10-CM | POA: Diagnosis not present

## 2021-03-30 DIAGNOSIS — I10 Essential (primary) hypertension: Secondary | ICD-10-CM | POA: Diagnosis not present

## 2021-03-30 DIAGNOSIS — E1169 Type 2 diabetes mellitus with other specified complication: Secondary | ICD-10-CM | POA: Diagnosis not present

## 2021-04-22 ENCOUNTER — Ambulatory Visit (INDEPENDENT_AMBULATORY_CARE_PROVIDER_SITE_OTHER): Payer: PPO | Admitting: Ophthalmology

## 2021-04-22 ENCOUNTER — Encounter (INDEPENDENT_AMBULATORY_CARE_PROVIDER_SITE_OTHER): Payer: Self-pay | Admitting: Ophthalmology

## 2021-04-22 ENCOUNTER — Other Ambulatory Visit: Payer: Self-pay

## 2021-04-22 DIAGNOSIS — H2512 Age-related nuclear cataract, left eye: Secondary | ICD-10-CM | POA: Diagnosis not present

## 2021-04-22 DIAGNOSIS — H2511 Age-related nuclear cataract, right eye: Secondary | ICD-10-CM

## 2021-04-22 DIAGNOSIS — E113412 Type 2 diabetes mellitus with severe nonproliferative diabetic retinopathy with macular edema, left eye: Secondary | ICD-10-CM

## 2021-04-22 DIAGNOSIS — E113411 Type 2 diabetes mellitus with severe nonproliferative diabetic retinopathy with macular edema, right eye: Secondary | ICD-10-CM | POA: Diagnosis not present

## 2021-04-22 MED ORDER — AFLIBERCEPT 2MG/0.05ML IZ SOLN FOR KALEIDOSCOPE
2.0000 mg | INTRAVITREAL | Status: AC | PRN
  Administered 2021-04-22: 2 mg via INTRAVITREAL

## 2021-04-22 NOTE — Progress Notes (Signed)
04/22/2021     CHIEF COMPLAINT Patient presents for  Chief Complaint  Patient presents with   Retina Follow Up    8 week fu OD and Eylea OD Pt states VA OU stable since last visit. Pt denies FOL, floaters, or ocular pain OU.  A1C:6.9 LBS:120       HISTORY OF PRESENT ILLNESS: Brian Herring is a 65 y.o. male who presents to the clinic today for:   HPI     Retina Follow Up   Patient presents with  Diabetic Retinopathy.  In left eye.  This started 13 weeks ago.  Severity is mild.  Duration of 13 weeks.  Since onset it is stable. Additional comments: 8 week fu OD and Eylea OD Pt states VA OU stable since last visit. Pt denies FOL, floaters, or ocular pain OU.  A1C:6.9 LBS:120         Comments   13 week fu OS oct Eylea OS. Patient states vision is stable and unchanged since last visit. Denies any new floaters or FOL. LBS: Has not checked today yet, yesterday 134 after eating. A1c: 6.9      Last edited by [name] on 04/22/2021 10:26 AM.      Referring physician: Monna Fam, MD [place],  [place] [id number]  HISTORICAL INFORMATION:   Selected notes from the MEDICAL RECORD NUMBER    Lab Results  Component Value Date   HGBA1C 7.0 (H) 03/12/2018     CURRENT MEDICATIONS: No current outpatient medications on file. (Ophthalmic Drugs)   No current facility-administered medications for this visit. (Ophthalmic Drugs)   Current Outpatient Medications (Other)  Medication Sig   blood glucose meter kit and supplies KIT Dispense based on patient and insurance preference. Use up to four times daily as directed. (FOR ICD-10: E 11.65).   furosemide (LASIX) 40 MG tablet TAKE ONE TABLET BY MOUTH DAILY   glipiZIDE (GLUCOTROL XL) 5 MG 24 hr tablet Take 5 mg by mouth daily.   ivabradine ([name]) 5 MG TABS tablet Take 1 tablet (5 mg total) by mouth 2 (two) times daily with a meal.   JARDIANCE 25 MG TABS tablet Take 1 tablet by mouth daily.    Lancets (ONETOUCH DELICA PLUS [id number]) MISC Use to test blood sugar daily   metFORMIN (GLUCOPHAGE-XR) 500 MG 24 hr tablet Take 500 mg by mouth 2 (two) times daily.   metoprolol succinate (TOPROL-XL) 25 MG 24 hr tablet TAKE 1 AND 1/2 TABLETS BY MOUTH ONCE DAILY   mupirocin ointment (BACTROBAN) 2 % Apply to affected area three times daily for 5 days   ONE TOUCH ULTRA TEST test strip Use to test blood sugar daily   rosuvastatin (CRESTOR) 10 MG tablet Take 1 tablet (10 mg total) by mouth daily.   sacubitril-valsartan (ENTRESTO) 97-103 MG Take 1 tablet by mouth 2 (two) times daily.   spironolactone (ALDACTONE) 25 MG tablet TAKE ONE TABLET BY MOUTH ONCE DAILY Needs appointment for further refills   No current facility-administered medications for this visit. (Other)      REVIEW OF SYSTEMS:    ALLERGIES Allergies  Allergen Reactions   Elemental Sulfur Rash   Norvasc [Amlodipine Besylate] Rash   Sulfa Antibiotics Rash    PAST MEDICAL HISTORY Past Medical History:  Diagnosis Date   Arthritis    shoulder   Cancer ([place])    skin cancer on nose   Cardiomyopathy ([place])    Chronic systolic CHF (congestive heart failure) (  [place])    Diabetes mellitus without complication ([place])    Hyperlipidemia    Hypertension    Past Surgical History:  Procedure Laterality Date   AMPUTATION Right 10/14/2017   Procedure: AMPUTATION  GREAT TOE;  Surgeon: [name], MD;  Location: [facility];  Service: Orthopedics;  Laterality: Right;   AMPUTATION Right 10/18/2017   Procedure: RIGHT BELOW KNEE AMPUTATION;  Surgeon: [name], MD;  Location: [facility];  Service: Orthopedics;  Laterality: Right;   CYSTOSCOPY WITH INSERTION OF UROLIFT N/A 03/15/2018   Procedure: CYSTOSCOPY WITH INSERTION OF UROLIFT;  Surgeon: Franchot Gallo, MD;  Location: [facility];  Service: Urology;  Laterality: N/A;   SHOULDER SURGERY  01/05   STUMP REVISION Right 12/15/2017   Procedure: REVISION RIGHT BELOW KNEE AMPUTATION;  Surgeon:  [name], MD;  Location: [facility];  Service: Orthopedics;  Laterality: Right;    FAMILY HISTORY Family History  Problem Relation Age of Onset   Pneumonia Mother        aspiration    SOCIAL HISTORY Social History   Tobacco Use   Smoking status: Never   Smokeless tobacco: Never  Vaping Use   Vaping Use: Never used  Substance Use Topics   Alcohol use: No    Alcohol/week: 0.0 standard drinks   Drug use: Never         OPHTHALMIC EXAM:  Base Eye Exam     Visual Acuity (ETDRS)       Right Left   Dist [place] 20/40 -1 20/70 -2   Dist ph [place] NI 20/50 -2    Correction: Glasses         Tonometry (Tonopen, 10:32 AM)       Right Left   Pressure 13 14         Pupils       Pupils Dark Light APD   Right PERRL 5 4 None   Left PERRL 5 4 None         Extraocular Movement       Right Left    Full Full         Neuro/Psych     Oriented x3: Yes   Mood/Affect: Normal         Dilation     Left eye: 1.0% Mydriacyl, 2.5% Phenylephrine @ 10:32 AM           Slit Lamp and Fundus Exam     External Exam       Right Left   External Normal Normal         Slit Lamp Exam       Right Left   Lids/Lashes Normal Normal   Conjunctiva/Sclera White and quiet White and quiet   Cornea Clear Clear   Anterior Chamber Deep and quiet Deep and quiet   Iris Round and reactive Round and reactive   Lens 2+ Nuclear sclerosis, Cortical spokes 2+ 2+ Nuclear sclerosis   Anterior Vitreous Normal Normal         Fundus Exam       Right Left   Posterior Vitreous  Normal   Disc  Normal   C/D Ratio  0.35   Macula  Clinically significant macular edema, Mild clinically significant macular edema   Vessels  Severe NPDR   Periphery  Moderate scatter pattern laser, peripherally, room temporally for more laser            IMAGING AND PROCEDURES  Imaging and Procedures for 04/22/21  Intravitreal  Injection, Pharmacologic Agent - OS - Left Eye       Time  Out 04/22/2021. 10:54 AM. Confirmed correct patient, procedure, site, and patient consented.   Anesthesia Topical anesthesia was used. Anesthetic medications included [place] 3.5%.   Procedure Preparation included 10% betadine to eyelids, 5% betadine to ocular surface, Tobramycin 0.3%. A 30 gauge needle was used.   Injection: 2 mg aflibercept 2 MG/0.05ML   Route: Intravitreal, Site: Left Eye   NDC: [id number], Lot: [id number], Waste: 0 mL   Post-op Post injection exam found visual acuity of at least counting fingers. The patient tolerated the procedure well. There were no complications. The patient received written and verbal post procedure care education. Post injection medications included ocuflox.      OCT, Retina - OU - Both Eyes       Right Eye Quality was good. Scan locations included subfoveal. Central Foveal Thickness: 274. Progression has improved. Findings include abnormal foveal contour, cystoid macular edema.   Left Eye Quality was good. Scan locations included subfoveal. Central Foveal Thickness: 314. Progression has improved. Findings include abnormal foveal contour, cystoid macular edema.   Notes Mild perifoveal CME vastly improved overall however and still improving even at delayed interval OS, to 13 weeks.  OD looks great at 8 weeks post injection of Eylea.  Bilateral media opacity, from cataract OU.               ASSESSMENT/PLAN:  Severe nonproliferative diabetic retinopathy of left eye, with macular edema, associated with type 2 diabetes mellitus ([place]) OS now at 13 weeks post intravitreal Eylea and still continuing to have improved CSME center involvement and stable acuity.  We will repeat today at 13 weeks and follow-up left eye next in 4 months  Severe nonproliferative diabetic retinopathy of right eye, with macular edema, associated with type 2 diabetes mellitus ([place]) OD currently at 8 weeks post most recent injection Eylea and still improving  center involved CSME, follow-up as scheduled in 2 weeks OD  Nuclear sclerotic cataract of right eye I do recommend follow-up with Dr. Baldemar Lenis of [facility] care and consideration of cataract surgery  Nuclear sclerotic cataract of left eye I do recommend follow-up with Dr. Baldemar Lenis of [facility] eye care and consideration of cataract surgery     ICD-10-CM   1. Severe nonproliferative diabetic retinopathy of left eye, with macular edema, associated with type 2 diabetes mellitus (H[facility])  H96.2229 Intravitreal Injection, Pharmacologic Agent - OS - Left Eye    OCT, Retina - OU - Both Eyes    aflibercept (EYLEA) SOLN 2 mg    2. Severe nonproliferative diabetic retinopathy of right eye, with macular edema, associated with type 2 diabetes mellitus ([place])  E11.3411     3. Nuclear sclerotic cataract of right eye  H25.11     4. Nuclear sclerotic cataract of left eye  H25.12       1.  CSME OU vastly improved with rather aggressive injection intravitreal Eylea over the last 2 years.  For PRP has been added.  OS today at 13-week interval, still improving center involved CSME as compared to prior exams.  Repeat today and examination next OS in 4 months 2.  OD follow-up as scheduled  3.  Ophthalmic Meds Ordered this visit:  Meds ordered this encounter  Medications   aflibercept (EYLEA) SOLN 2 mg       No follow-ups on file.  There are no Patient Instructions on file for this visit.  Explained the diagnoses, plan, and follow up with the patient and they expressed understanding.  Patient expressed understanding of the importance of proper follow up care.   Clent Demark Jenne Sellinger M.D. Diseases & Surgery of the Retina and Vitreous Retina & Diabetic [place] 04/22/21     Abbreviations: M myopia (nearsighted); A astigmatism; H hyperopia (farsighted); P presbyopia; Mrx spectacle prescription;  CTL contact lenses; OD right eye; OS left eye; OU both eyes  XT exotropia; ET esotropia;  PEK punctate epithelial keratitis; PEE punctate epithelial erosions; DES dry eye syndrome; MGD meibomian gland dysfunction; ATs artificial tears; PFAT's preservative free artificial tears; [place] nuclear sclerotic cataract; PSC posterior subcapsular cataract; ERM epi-retinal membrane; PVD posterior vitreous detachment; RD retinal detachment; DM diabetes mellitus; DR diabetic retinopathy; NPDR non-proliferative diabetic retinopathy; PDR proliferative diabetic retinopathy; CSME clinically significant macular edema; DME diabetic macular edema; dbh dot blot hemorrhages; CWS cotton wool spot; POAG primary open angle glaucoma; C/D cup-to-disc ratio; HVF humphrey visual field; GVF goldmann visual field; OCT optical coherence tomography; IOP intraocular pressure; BRVO Branch retinal vein occlusion; CRVO central retinal vein occlusion; CRAO central retinal artery occlusion; BRAO branch retinal artery occlusion; RT retinal tear; SB scleral buckle; PPV pars plana vitrectomy; VH Vitreous hemorrhage; PRP panretinal laser photocoagulation; IVK intravitreal kenalog; VMT vitreomacular traction; MH Macular hole;  NVD neovascularization of the disc; NVE neovascularization elsewhere; AREDS age related eye disease study; ARMD age related macular degeneration; POAG primary open angle glaucoma; EBMD epithelial/anterior basement membrane dystrophy; ACIOL anterior chamber intraocular lens; IOL intraocular lens; PCIOL posterior chamber intraocular lens; Phaco/IOL phacoemulsification with intraocular lens placement; [place] photorefractive keratectomy; LASIK laser assisted in situ keratomileusis; HTN hypertension; DM diabetes mellitus; COPD chronic obstructive pulmonary disease

## 2021-04-22 NOTE — Assessment & Plan Note (Signed)
OD currently at 8 weeks post most recent injection Eylea and still improving center involved CSME, follow-up as scheduled in 2 weeks OD

## 2021-04-22 NOTE — Assessment & Plan Note (Signed)
I do recommend follow-up with Dr. Baldemar Lenis of [facility] eye care and consideration of cataract surgery

## 2021-04-22 NOTE — Assessment & Plan Note (Signed)
I do recommend follow-up with Dr. Baldemar Lenis of [facility] eye care and consideration of cataract surgery

## 2021-04-22 NOTE — Assessment & Plan Note (Signed)
OS now at 13 weeks post intravitreal Eylea and still continuing to have improved CSME center involvement and stable acuity.  We will repeat today at 13 weeks and follow-up left eye next in 4 months

## 2021-04-23 DIAGNOSIS — E113413 Type 2 diabetes mellitus with severe nonproliferative diabetic retinopathy with macular edema, bilateral: Secondary | ICD-10-CM | POA: Diagnosis not present

## 2021-04-29 ENCOUNTER — Encounter (INDEPENDENT_AMBULATORY_CARE_PROVIDER_SITE_OTHER): Payer: PPO | Admitting: Ophthalmology

## 2021-05-03 ENCOUNTER — Encounter (INDEPENDENT_AMBULATORY_CARE_PROVIDER_SITE_OTHER): Payer: Self-pay | Admitting: Ophthalmology

## 2021-05-03 ENCOUNTER — Other Ambulatory Visit: Payer: Self-pay

## 2021-05-03 ENCOUNTER — Ambulatory Visit (INDEPENDENT_AMBULATORY_CARE_PROVIDER_SITE_OTHER): Payer: PPO | Admitting: Ophthalmology

## 2021-05-03 DIAGNOSIS — E113411 Type 2 diabetes mellitus with severe nonproliferative diabetic retinopathy with macular edema, right eye: Secondary | ICD-10-CM

## 2021-05-03 DIAGNOSIS — E113412 Type 2 diabetes mellitus with severe nonproliferative diabetic retinopathy with macular edema, left eye: Secondary | ICD-10-CM

## 2021-05-03 DIAGNOSIS — H2512 Age-related nuclear cataract, left eye: Secondary | ICD-10-CM | POA: Diagnosis not present

## 2021-05-03 DIAGNOSIS — H2511 Age-related nuclear cataract, right eye: Secondary | ICD-10-CM

## 2021-05-03 MED ORDER — AFLIBERCEPT 2MG/0.05ML IZ SOLN FOR KALEIDOSCOPE
2.0000 mg | INTRAVITREAL | Status: AC | PRN
  Administered 2021-05-03: 2 mg via INTRAVITREAL

## 2021-05-03 NOTE — Assessment & Plan Note (Signed)
I recommend cataract extraction performance in the left eye initially as this eye on the longer interval of antivegF therapy with resolution of CSME, and only small remnants of chronic CME remaining

## 2021-05-03 NOTE — Assessment & Plan Note (Signed)
Also return to [facility] eye care for evaluation and consideration of cataract extraction right eye to maximize acuity

## 2021-05-03 NOTE — Assessment & Plan Note (Signed)
OS post recent injection Eylea, still doing well acuity limited by media opacity, cataract

## 2021-05-03 NOTE — Assessment & Plan Note (Signed)
OD much improved today at 9 weeks 4 days post most recent injection.  Repeat intravitreal Eylea to maintain CSME resolution as compared to onset of therapy.  Vision limited by media opacity from cataract

## 2021-05-03 NOTE — Progress Notes (Signed)
05/03/2021     CHIEF COMPLAINT Patient presents for  Chief Complaint  Patient presents with   Retina Follow Up    8 week fu OD and Eylea OD Pt states VA OU stable since last visit. Pt denies FOL, floaters, or ocular pain OU.  A1C:6.9 LBS:120       HISTORY OF PRESENT ILLNESS: Brian Herring is a 65 y.o. male who presents to the clinic today for:   HPI     Retina Follow Up   Patient presents with  Diabetic Retinopathy.  In right eye.  This started 9 weeks ago.  Severity is mild.  Duration of 9 weeks.  Since onset it is stable. Additional comments: 8 week fu OD and Eylea OD Pt states VA OU stable since last visit. Pt denies FOL, floaters, or ocular pain OU.  A1C:6.9 LBS:120         Comments   Patient states vision is stable and unchanged since last visit. Denies any new floaters or FOL. A1C: 6.9 LBS: 102 yesterday afternoon. "Pt states "they want me to take it during the day instead of the morning."      Last edited by [name] on 05/03/2021 10:13 AM.      Referring physician: [name], MD [place],  [place] [address]  HISTORICAL INFORMATION:   Selected notes from the [facility]    Lab Results  Component Value Date   HGBA1C 7.0 (H) 03/12/2018     CURRENT MEDICATIONS: No current outpatient medications on file. (Ophthalmic Drugs)   No current facility-administered medications for this visit. (Ophthalmic Drugs)   Current Outpatient Medications (Other)  Medication Sig   blood glucose meter kit and supplies KIT Dispense based on patient and insurance preference. Use up to four times daily as directed. (FOR ICD-10: E 11.65).   furosemide (LASIX) 40 MG tablet TAKE ONE TABLET BY MOUTH DAILY   glipiZIDE (GLUCOTROL XL) 5 MG 24 hr tablet Take 5 mg by mouth daily.   ivabradine ([name]) 5 MG TABS tablet Take 1 tablet (5 mg total) by mouth 2 (two) times daily with a meal.   JARDIANCE 25 MG TABS tablet Take 1 tablet  by mouth daily.   Lancets (ONETOUCH DELICA PLUS [id number]) MISC Use to test blood sugar daily   metFORMIN (GLUCOPHAGE-XR) 500 MG 24 hr tablet Take 500 mg by mouth 2 (two) times daily.   metoprolol succinate (TOPROL-XL) 25 MG 24 hr tablet TAKE 1 AND 1/2 TABLETS BY MOUTH ONCE DAILY   mupirocin ointment (BACTROBAN) 2 % Apply to affected area three times daily for 5 days   ONE TOUCH ULTRA TEST test strip Use to test blood sugar daily   rosuvastatin (CRESTOR) 10 MG tablet Take 1 tablet (10 mg total) by mouth daily.   sacubitril-valsartan (ENTRESTO) 97-103 MG Take 1 tablet by mouth 2 (two) times daily.   spironolactone (ALDACTONE) 25 MG tablet TAKE ONE TABLET BY MOUTH ONCE DAILY Needs appointment for further refills   No current facility-administered medications for this visit. (Other)      REVIEW OF SYSTEMS:    ALLERGIES Allergies  Allergen Reactions   Elemental Sulfur Rash   Norvasc [Amlodipine Besylate] Rash   Sulfa Antibiotics Rash    PAST MEDICAL HISTORY Past Medical History:  Diagnosis Date   Arthritis    shoulder   Cancer ([place])    skin cancer on nose   Cardiomyopathy ([place])    Chronic systolic CHF (  congestive heart failure) ([place])    Diabetes mellitus without complication ([place])    Hyperlipidemia    Hypertension    Past Surgical History:  Procedure Laterality Date   AMPUTATION Right 10/14/2017   Procedure: AMPUTATION  GREAT TOE;  Surgeon: [name], MD;  Location: [facility];  Service: Orthopedics;  Laterality: Right;   AMPUTATION Right 10/18/2017   Procedure: RIGHT BELOW KNEE AMPUTATION;  Surgeon: [name], MD;  Location: [facility];  Service: Orthopedics;  Laterality: Right;   CYSTOSCOPY WITH INSERTION OF UROLIFT N/A 03/15/2018   Procedure: CYSTOSCOPY WITH INSERTION OF UROLIFT;  Surgeon: Franchot Gallo, MD;  Location: [facility];  Service: Urology;  Laterality: N/A;   SHOULDER SURGERY  01/05   STUMP REVISION Right 12/15/2017   Procedure: REVISION RIGHT BELOW KNEE  AMPUTATION;  Surgeon: [name], MD;  Location: [facility];  Service: Orthopedics;  Laterality: Right;    FAMILY HISTORY Family History  Problem Relation Age of Onset   Pneumonia Mother        aspiration    SOCIAL HISTORY Social History   Tobacco Use   Smoking status: Never   Smokeless tobacco: Never  Vaping Use   Vaping Use: Never used  Substance Use Topics   Alcohol use: No    Alcohol/week: 0.0 standard drinks   Drug use: Never         OPHTHALMIC EXAM:  Base Eye Exam     Visual Acuity (ETDRS)       Right Left   Dist [place] 20/40 -2 20/70 -2   Dist ph [place] 20/30 -1 20/50 -2         Tonometry (Tonopen, 10:11 AM)       Right Left   Pressure 14 19         Pupils       Pupils Dark Light Shape React APD   Right PERRL 5 4 Round Brisk None   Left PERRL 5 4 Round Brisk None         Extraocular Movement       Right Left    Full Full         Neuro/Psych     Oriented x3: Yes   Mood/Affect: Normal         Dilation     Right eye: 1.0% Mydriacyl, 2.5% Phenylephrine @ 10:11 AM           Slit Lamp and Fundus Exam     External Exam       Right Left   External Normal Normal         Slit Lamp Exam       Right Left   Lids/Lashes Normal Normal   Conjunctiva/Sclera White and quiet White and quiet   Cornea Clear Clear   Anterior Chamber Deep and quiet Deep and quiet   Iris Round and reactive Round and reactive   Lens 3+ Nuclear sclerosis, Cortical spokes 2+ 2+ Nuclear sclerosis   Anterior Vitreous Normal Normal         Fundus Exam       Right Left   Posterior Vitreous Posterior vitreous detachment    Disc Normal    C/D Ratio 0.2    Macula Hemorrhage, Microaneurysms, Mild clinically significant macular edema, no macular thickening    Vessels Severe nonproliferative diabetic retinopathy    Periphery Normal, moderate scatter PRP nasal.  Room temporally and superiorly for additional laser  IMAGING AND PROCEDURES   Imaging and Procedures for 05/03/21  OCT, Retina - OU - Both Eyes       Right Eye Quality was good. Scan locations included subfoveal. Central Foveal Thickness: 265. Progression has improved. Findings include abnormal foveal contour, cystoid macular edema.   Left Eye Quality was good. Scan locations included subfoveal. Central Foveal Thickness: 310. Progression has improved. Findings include abnormal foveal contour, cystoid macular edema.   Notes Mild perifoveal CME vastly improved overall however and still improving even 1 week post recent injection OS  OD looks great at 9.5 weeks post injection of Eylea.  Bilateral media opacity, from cataract OU.       Intravitreal Injection, Pharmacologic Agent - OD - Right Eye       Time Out 05/03/2021. 10:52 AM. Confirmed correct patient, procedure, site, and patient consented.   Anesthesia Topical anesthesia was used. Anesthetic medications included [place] 3.5%.   Procedure Preparation included Tobramycin 0.3%, 10% betadine to eyelids, 5% betadine to ocular surface. A 30 gauge needle was used.   Injection: 2 mg aflibercept 2 MG/0.05ML   Route: Intravitreal, Site: Right Eye   NDC: A3590391, Lot: [id number], Waste: 0 mL   Post-op Post injection exam found visual acuity of at least counting fingers. The patient tolerated the procedure well. There were no complications. The patient received written and verbal post procedure care education. Post injection medications included ocuflox.              ASSESSMENT/PLAN:  Severe nonproliferative diabetic retinopathy of right eye, with macular edema, associated with type 2 diabetes mellitus ([place]) OD much improved today at 9 weeks 4 days post most recent injection.  Repeat intravitreal Eylea to maintain CSME resolution as compared to onset of therapy.  Vision limited by media opacity from cataract  Severe nonproliferative diabetic retinopathy of left eye, with macular edema,  associated with type 2 diabetes mellitus ([place]) OS post recent injection Eylea, still doing well acuity limited by media opacity, cataract  Nuclear sclerotic cataract of left eye I recommend cataract extraction performance in the left eye initially as this eye on the longer interval of antivegF therapy with resolution of CSME, and only small remnants of chronic CME remaining  Nuclear sclerotic cataract of right eye Also return to [facility] care for evaluation and consideration of cataract extraction right eye to maximize acuity     ICD-10-CM   1. Severe nonproliferative diabetic retinopathy of right eye, with macular edema, associated with type 2 diabetes mellitus (HCC)  E11.3411 OCT, Retina - OU - Both Eyes    Intravitreal Injection, Pharmacologic Agent - OD - Right Eye    aflibercept (EYLEA) SOLN 2 mg    2. Severe nonproliferative diabetic retinopathy of left eye, with macular edema, associated with type 2 diabetes mellitus ([place])  B71.6967     3. Nuclear sclerotic cataract of left eye  H25.12     4. Nuclear sclerotic cataract of right eye  H25.11       1.  Improved CSME overall OU.  Repeat injection intravitreal Eylea OD today at 9.5 weeks post most recent injection.  2.  OU with nuclear sclerotic cataract which require surgical intervention.  I recommend prompt follow-up with Dr. Herbert Deaner and consideration of cataract surgery maximize visual potential  3.  Ophthalmic Meds Ordered this visit:  Meds ordered this encounter  Medications   aflibercept (EYLEA) SOLN 2 mg       Return in about 10 weeks (around 07/12/2021) for  dilate, OD, EYLEA OCT,, and follow-up OS as scheduled.  There are no Patient Instructions on file for this visit.   Explained the diagnoses, plan, and follow up with the patient and they expressed understanding.  Patient expressed understanding of the importance of proper follow up care.   Clent Demark Marlee Armenteros M.D. Diseases & Surgery of the Retina and  Vitreous Retina & Diabetic [place] 05/03/21     Abbreviations: M myopia (nearsighted); A astigmatism; H hyperopia (farsighted); P presbyopia; Mrx spectacle prescription;  CTL contact lenses; OD right eye; OS left eye; OU both eyes  XT exotropia; ET esotropia; PEK punctate epithelial keratitis; PEE punctate epithelial erosions; DES dry eye syndrome; MGD meibomian gland dysfunction; ATs artificial tears; PFAT's preservative free artificial tears; [place] nuclear sclerotic cataract; PSC posterior subcapsular cataract; ERM epi-retinal membrane; PVD posterior vitreous detachment; RD retinal detachment; DM diabetes mellitus; DR diabetic retinopathy; NPDR non-proliferative diabetic retinopathy; PDR proliferative diabetic retinopathy; CSME clinically significant macular edema; DME diabetic macular edema; dbh dot blot hemorrhages; CWS cotton wool spot; POAG primary open angle glaucoma; C/D cup-to-disc ratio; HVF humphrey visual field; GVF goldmann visual field; OCT optical coherence tomography; IOP intraocular pressure; BRVO Branch retinal vein occlusion; CRVO central retinal vein occlusion; CRAO central retinal artery occlusion; BRAO branch retinal artery occlusion; RT retinal tear; SB scleral buckle; PPV pars plana vitrectomy; VH Vitreous hemorrhage; PRP panretinal laser photocoagulation; IVK intravitreal kenalog; VMT vitreomacular traction; MH Macular hole;  NVD neovascularization of the disc; NVE neovascularization elsewhere; AREDS age related eye disease study; ARMD age related macular degeneration; POAG primary open angle glaucoma; EBMD epithelial/anterior basement membrane dystrophy; ACIOL anterior chamber intraocular lens; IOL intraocular lens; PCIOL posterior chamber intraocular lens; Phaco/IOL phacoemulsification with intraocular lens placement; [place] photorefractive keratectomy; LASIK laser assisted in situ keratomileusis; HTN hypertension; DM diabetes mellitus; COPD chronic obstructive pulmonary disease

## 2021-05-12 DIAGNOSIS — Z89511 Acquired absence of right leg below knee: Secondary | ICD-10-CM | POA: Diagnosis not present

## 2021-05-20 ENCOUNTER — Other Ambulatory Visit: Payer: Self-pay | Admitting: Cardiology

## 2021-05-24 DIAGNOSIS — E1169 Type 2 diabetes mellitus with other specified complication: Secondary | ICD-10-CM | POA: Diagnosis not present

## 2021-05-24 DIAGNOSIS — E7800 Pure hypercholesterolemia, unspecified: Secondary | ICD-10-CM | POA: Diagnosis not present

## 2021-05-24 DIAGNOSIS — E113413 Type 2 diabetes mellitus with severe nonproliferative diabetic retinopathy with macular edema, bilateral: Secondary | ICD-10-CM | POA: Diagnosis not present

## 2021-05-24 DIAGNOSIS — I10 Essential (primary) hypertension: Secondary | ICD-10-CM | POA: Diagnosis not present

## 2021-06-23 DIAGNOSIS — E113413 Type 2 diabetes mellitus with severe nonproliferative diabetic retinopathy with macular edema, bilateral: Secondary | ICD-10-CM | POA: Diagnosis not present

## 2021-07-12 ENCOUNTER — Encounter (INDEPENDENT_AMBULATORY_CARE_PROVIDER_SITE_OTHER): Payer: PPO | Admitting: Ophthalmology

## 2021-07-23 DIAGNOSIS — E113413 Type 2 diabetes mellitus with severe nonproliferative diabetic retinopathy with macular edema, bilateral: Secondary | ICD-10-CM | POA: Diagnosis not present

## 2021-07-28 ENCOUNTER — Other Ambulatory Visit: Payer: Self-pay

## 2021-07-28 ENCOUNTER — Encounter (INDEPENDENT_AMBULATORY_CARE_PROVIDER_SITE_OTHER): Payer: Self-pay | Admitting: Ophthalmology

## 2021-07-28 ENCOUNTER — Ambulatory Visit (INDEPENDENT_AMBULATORY_CARE_PROVIDER_SITE_OTHER): Payer: PPO | Admitting: Ophthalmology

## 2021-07-28 DIAGNOSIS — E113411 Type 2 diabetes mellitus with severe nonproliferative diabetic retinopathy with macular edema, right eye: Secondary | ICD-10-CM

## 2021-07-28 DIAGNOSIS — E113412 Type 2 diabetes mellitus with severe nonproliferative diabetic retinopathy with macular edema, left eye: Secondary | ICD-10-CM

## 2021-07-28 MED ORDER — AFLIBERCEPT 2MG/0.05ML IZ SOLN FOR KALEIDOSCOPE
2.0000 mg | INTRAVITREAL | Status: AC | PRN
  Administered 2021-07-28: 2 mg via INTRAVITREAL

## 2021-07-28 NOTE — Assessment & Plan Note (Signed)
OS no signs of recurrence of CSME 3 months follow-up.

## 2021-07-28 NOTE — Progress Notes (Addendum)
07/28/2021     CHIEF COMPLAINT Patient presents for  Chief Complaint  Patient presents with   Retina Follow Up    8 week fu OD and Eylea OD Pt states VA OU stable since last visit. Pt denies FOL, floaters, or ocular pain OU.  A1C:6.9 LBS:120       HISTORY OF PRESENT ILLNESS: Brian Herring is a 66 y.o. male who presents to the clinic today for:   HPI     Retina Follow Up           Diagnosis: Diabetic Retinopathy   Laterality: right eye   Onset: 12 weeks ago   Severity: mild   Duration: 12 weeks   Course: stable   Comments: 8 week fu OD and Eylea OD Pt states VA OU stable since last visit. Pt denies FOL, floaters, or ocular pain OU.  A1C:6.9 LBS:120          Comments   12 week fu OD OCT Eylea OD. Patient states vision is stable and unchanged since last visit. Denies any new floaters or FOL.       Last edited by [name] on 07/28/2021 10:33 AM.      Referring physician: [name], MD [place],  [place] [address]  HISTORICAL INFORMATION:   Selected notes from the MEDICAL RECORD NUMBER    Lab Results  Component Value Date   HGBA1C 7.0 (H) 03/12/2018     CURRENT MEDICATIONS: No current outpatient medications on file. (Ophthalmic Drugs)   No current facility-administered medications for this visit. (Ophthalmic Drugs)   Current Outpatient Medications (Other)  Medication Sig   blood glucose meter kit and supplies KIT Dispense based on patient and insurance preference. Use up to four times daily as directed. (FOR ICD-10: E 11.65).   furosemide (LASIX) 40 MG tablet TAKE ONE TABLET BY MOUTH DAILY   glipiZIDE (GLUCOTROL XL) 5 MG 24 hr tablet Take 5 mg by mouth daily.   ivabradine ([name]) 5 MG TABS tablet Take 1 tablet (5 mg total) by mouth 2 (two) times daily with a meal.   JARDIANCE 25 MG TABS tablet Take 1 tablet by mouth daily.   Lancets (ONETOUCH DELICA PLUS [id number]) MISC Use to test blood sugar daily    metFORMIN (GLUCOPHAGE-XR) 500 MG 24 hr tablet Take 500 mg by mouth 2 (two) times daily.   metoprolol succinate (TOPROL-XL) 25 MG 24 hr tablet TAKE 1 AND 1/2 TABLETS BY MOUTH ONCE DAILY   mupirocin ointment (BACTROBAN) 2 % Apply to affected area three times daily for 5 days   ONE TOUCH ULTRA TEST test strip Use to test blood sugar daily   rosuvastatin (CRESTOR) 10 MG tablet Take 1 tablet (10 mg total) by mouth daily.   sacubitril-valsartan (ENTRESTO) 97-103 MG Take 1 tablet by mouth 2 (two) times daily.   spironolactone (ALDACTONE) 25 MG tablet TAKE ONE TABLET BY MOUTH ONCE DAILY Needs appointment for further refills   No current facility-administered medications for this visit. (Other)      REVIEW OF SYSTEMS:    ALLERGIES Allergies  Allergen Reactions   Elemental Sulfur Rash   Norvasc [Amlodipine Besylate] Rash   Sulfa Antibiotics Rash    PAST MEDICAL HISTORY Past Medical History:  Diagnosis Date   Arthritis    shoulder   Cancer ([place])    skin cancer on nose   Cardiomyopathy ([place])    Chronic systolic CHF (congestive heart failure) ([place])  Diabetes mellitus without complication ([place])    Hyperlipidemia    Hypertension    Past Surgical History:  Procedure Laterality Date   AMPUTATION Right 10/14/2017   Procedure: AMPUTATION  GREAT TOE;  Surgeon: [name], MD;  Location: [facility];  Service: Orthopedics;  Laterality: Right;   AMPUTATION Right 10/18/2017   Procedure: RIGHT BELOW KNEE AMPUTATION;  Surgeon: [name], MD;  Location: [facility];  Service: Orthopedics;  Laterality: Right;   CYSTOSCOPY WITH INSERTION OF UROLIFT N/A 03/15/2018   Procedure: CYSTOSCOPY WITH INSERTION OF UROLIFT;  Surgeon: Franchot Gallo, MD;  Location[facility];  Service: Urology;  Laterality: N/A;   SHOULDER SURGERY  01/05   STUMP REVISION Right 12/15/2017   Procedure: REVISION RIGHT BELOW KNEE AMPUTATION;  Surgeon: [name], MD;  Location: [facility];  Service: Orthopedics;  Laterality: Right;     FAMILY HISTORY Family History  Problem Relation Age of Onset   Pneumonia Mother        aspiration    SOCIAL HISTORY Social History   Tobacco Use   Smoking status: Never   Smokeless tobacco: Never  Vaping Use   Vaping Use: Never used  Substance Use Topics   Alcohol use: No    Alcohol/week: 0.0 standard drinks   Drug use: Never         OPHTHALMIC EXAM:  Base Eye Exam     Visual Acuity (ETDRS)       Right Left   Dist cc 20/50 -2 20/50 -1   Dist ph cc 20/30 -2 20/40 +2    Correction: Glasses         Tonometry (Tonopen, 10:37 AM)       Right Left   Pressure 16 16         Pupils       Pupils Dark Light Shape React APD   Right PERRL 5 4 Round Brisk None   Left PERRL 5 4 Round Brisk None         Extraocular Movement       Right Left    Full Full         Neuro/Psych     Oriented x3: Yes   Mood/Affect: Normal         Dilation     Right eye: 1.0% Mydriacyl, 2.5% Phenylephrine @ 10:37 AM           Slit Lamp and Fundus Exam     External Exam       Right Left   External Normal Normal         Slit Lamp Exam       Right Left   Lids/Lashes Normal Normal   Conjunctiva/Sclera White and quiet White and quiet   Cornea Clear Clear   Anterior Chamber Deep and quiet Deep and quiet   Iris Round and reactive Round and reactive   Lens 3+ Nuclear sclerosis, Cortical spokes 2+ 2+ Nuclear sclerosis   Anterior Vitreous Normal Normal         Fundus Exam       Right Left   Posterior Vitreous Posterior vitreous detachment    Disc Normal    C/D Ratio 0.2    Macula Hemorrhage, Microaneurysms, Mild clinically significant macular edema, no macular thickening    Vessels Severe nonproliferative diabetic retinopathy    Periphery Normal, moderate scatter PRP nasal.  Room temporally and superiorly for additional laser  IMAGING AND PROCEDURES  Imaging and Procedures for 07/28/21  Intravitreal Injection, Pharmacologic  Agent - OD - Right Eye       Time Out 07/28/2021. 11:26 AM. Confirmed correct patient, procedure, site, and patient consented.   Anesthesia Topical anesthesia was used. Anesthetic medications included Lidocaine 4%.   Procedure Preparation included Tobramycin 0.3%, 10% betadine to eyelids, 5% betadine to ocular surface. A 30 gauge needle was used.   Injection: 2 mg aflibercept 2 MG/0.05ML   Route: Intravitreal, Site: Right Eye   NDC: [id number], Lot: [id number], Waste: 0 mL   Post-op Post injection exam found visual acuity of at least counting fingers. The patient tolerated the procedure well. There were no complications. The patient received written and verbal post procedure care education. Post injection medications included ocuflox.      OCT, Retina - OU - Both Eyes       Right Eye Quality was good. Scan locations included subfoveal. Central Foveal Thickness: 277. Progression has improved. Findings include abnormal foveal contour, cystoid macular edema.   Left Eye Quality was good. Scan locations included subfoveal. Central Foveal Thickness: 327. Progression has improved. Findings include abnormal foveal contour, cystoid macular edema.   Notes Mild perifoveal CME vastly improved overall however and still stable 7 months postinjection left eye OD slightly active CSME superiorly at 12 weeks post injection of Eylea.  Bilateral media opacity, from cataract OU.               ASSESSMENT/PLAN:  Severe nonproliferative diabetic retinopathy of right eye, with macular edema, associated with type 2 diabetes mellitus ([place]) OD today at 12-week follow-up with only mild recurrence of CSME superiorly.  We will repeat injection today And extend interval examination and may observe henceforth  Severe nonproliferative diabetic retinopathy of left eye, with macular edema, associated with type 2 diabetes mellitus (HCC) OS no signs of recurrence of CSME 3 months follow-up.      ICD-10-CM   1. Severe nonproliferative diabetic retinopathy of right eye, with macular edema, associated with type 2 diabetes mellitus (HCC)  E11.3411 Intravitreal Injection, Pharmacologic Agent - OD - Right Eye    OCT, Retina - OU - Both Eyes    aflibercept (EYLEA) SOLN 2 mg    2. Severe nonproliferative diabetic retinopathy of left eye, with macular edema, associated with type 2 diabetes mellitus ([place])  I01.6553       1.  OS no recurrence of CSME will observe  2.  OD, follow-up interval of 3 months with slight recurrence of CSME superior to FAZ, not center involved we will repeat Eylea today and just simply reevaluate in 3 months likely course angiography  3.  Ophthalmic Meds Ordered this visit:  Meds ordered this encounter  Medications   aflibercept (EYLEA) SOLN 2 mg       Return in 3 months (on 10/26/2021) for DILATE OU, COLOR FP, OCT, OPTOS FFA R/L.  There are no Patient Instructions on file for this visit.   Explained the diagnoses, plan, and follow up with the patient and they expressed understanding.  Patient expressed understanding of the importance of proper follow up care.   Clent Demark Samon Dishner M.D. Diseases & Surgery of the Retina and Vitreous Retina & Diabetic [place] 07/28/21     Abbreviations: M myopia (nearsighted); A astigmatism; H hyperopia (farsighted); P presbyopia; Mrx spectacle prescription;  CTL contact lenses; OD right eye; OS left eye; OU both eyes  XT exotropia; ET esotropia; PEK punctate epithelial keratitis; PEE punctate  epithelial erosions; DES dry eye syndrome; MGD meibomian gland dysfunction; ATs artificial tears; PFAT's preservative free artificial tears; [place] nuclear sclerotic cataract; PSC posterior subcapsular cataract; ERM epi-retinal membrane; PVD posterior vitreous detachment; RD retinal detachment; DM diabetes mellitus; DR diabetic retinopathy; NPDR non-proliferative diabetic retinopathy; PDR proliferative diabetic retinopathy; CSME clinically  significant macular edema; DME diabetic macular edema; dbh dot blot hemorrhages; CWS cotton wool spot; POAG primary open angle glaucoma; C/D cup-to-disc ratio; HVF humphrey visual field; GVF goldmann visual field; OCT optical coherence tomography; IOP intraocular pressure; BRVO Branch retinal vein occlusion; CRVO central retinal vein occlusion; CRAO central retinal artery occlusion; BRAO branch retinal artery occlusion; RT retinal tear; SB scleral buckle; PPV pars plana vitrectomy; VH Vitreous hemorrhage; PRP panretinal laser photocoagulation; IVK intravitreal kenalog; VMT vitreomacular traction; MH Macular hole;  NVD neovascularization of the disc; NVE neovascularization elsewhere; AREDS age related eye disease study; ARMD age related macular degeneration; POAG primary open angle glaucoma; EBMD epithelial/anterior basement membrane dystrophy; ACIOL anterior chamber intraocular lens; IOL intraocular lens; PCIOL posterior chamber intraocular lens; Phaco/IOL phacoemulsification with intraocular lens placement; [place] photorefractive keratectomy; LASIK laser assisted in situ keratomileusis; HTN hypertension; DM diabetes mellitus; COPD chronic obstructive pulmonary disease

## 2021-07-28 NOTE — Assessment & Plan Note (Signed)
OD today at 12-week follow-up with only mild recurrence of CSME superiorly.  We will repeat injection today And extend interval examination and may observe henceforth

## 2021-08-18 ENCOUNTER — Other Ambulatory Visit: Payer: Self-pay | Admitting: Cardiology

## 2021-08-22 DIAGNOSIS — I10 Essential (primary) hypertension: Secondary | ICD-10-CM | POA: Diagnosis not present

## 2021-08-22 DIAGNOSIS — E7800 Pure hypercholesterolemia, unspecified: Secondary | ICD-10-CM | POA: Diagnosis not present

## 2021-08-22 DIAGNOSIS — E1169 Type 2 diabetes mellitus with other specified complication: Secondary | ICD-10-CM | POA: Diagnosis not present

## 2021-08-23 ENCOUNTER — Encounter (INDEPENDENT_AMBULATORY_CARE_PROVIDER_SITE_OTHER): Payer: PPO | Admitting: Ophthalmology

## 2021-08-23 ENCOUNTER — Encounter (INDEPENDENT_AMBULATORY_CARE_PROVIDER_SITE_OTHER): Payer: Self-pay | Admitting: Ophthalmology

## 2021-08-23 ENCOUNTER — Ambulatory Visit (INDEPENDENT_AMBULATORY_CARE_PROVIDER_SITE_OTHER): Payer: PPO | Admitting: Ophthalmology

## 2021-08-23 ENCOUNTER — Other Ambulatory Visit: Payer: Self-pay

## 2021-08-23 DIAGNOSIS — E113411 Type 2 diabetes mellitus with severe nonproliferative diabetic retinopathy with macular edema, right eye: Secondary | ICD-10-CM | POA: Diagnosis not present

## 2021-08-23 DIAGNOSIS — H2512 Age-related nuclear cataract, left eye: Secondary | ICD-10-CM

## 2021-08-23 DIAGNOSIS — E113412 Type 2 diabetes mellitus with severe nonproliferative diabetic retinopathy with macular edema, left eye: Secondary | ICD-10-CM

## 2021-08-23 MED ORDER — AFLIBERCEPT 2MG/0.05ML IZ SOLN FOR KALEIDOSCOPE
2.0000 mg | INTRAVITREAL | Status: AC | PRN
  Administered 2021-08-23: 2 mg via INTRAVITREAL

## 2021-08-23 NOTE — Assessment & Plan Note (Signed)
Moderate impact on acuity

## 2021-08-23 NOTE — Assessment & Plan Note (Signed)
Much less active CSME with minor perifoveal CSME noted on OCT.Overall vastly improved and maintain at 69-month follow-up OS.  Repeat injection today to maintain follow-up next in 4 months OS and evaluate OU at that same time with no planned injection OS

## 2021-08-23 NOTE — Progress Notes (Signed)
08/23/2021     CHIEF COMPLAINT Patient presents for  Chief Complaint  Patient presents with   Retina Follow Up      HISTORY OF PRESENT ILLNESS: Brian Herring is a 66 y.o. male who presents to the clinic today for:   HPI     Retina Follow Up           Diagnosis: Diabetic Retinopathy   Laterality: left eye   Onset: 4 months ago   Severity: mild   Duration: 4 months   Course: stable         Comments   4 mos fu OS oct Eylea OS. Patient states vision is stable and unchanged since last visit. Denies any new floaters or FOL.       Last edited by [name] on 08/23/2021 10:02 AM.      Referring physician: [name], MD [place],  [place] [address]  HISTORICAL INFORMATION:   Selected notes from the MEDICAL RECORD NUMBER    Lab Results  Component Value Date   HGBA1C 7.0 (H) 03/12/2018     CURRENT MEDICATIONS: No current outpatient medications on file. (Ophthalmic Drugs)   No current facility-administered medications for this visit. (Ophthalmic Drugs)   Current Outpatient Medications (Other)  Medication Sig   blood glucose meter kit and supplies KIT Dispense based on patient and insurance preference. Use up to four times daily as directed. (FOR ICD-10: E 11.65).   furosemide (LASIX) 40 MG tablet TAKE ONE TABLET BY MOUTH ONCE DAILY   glipiZIDE (GLUCOTROL XL) 5 MG 24 hr tablet Take 5 mg by mouth daily.   ivabradine ([name]) 5 MG TABS tablet Take 1 tablet (5 mg total) by mouth 2 (two) times daily with a meal.   JARDIANCE 25 MG TABS tablet Take 1 tablet by mouth daily.   Lancets (ONETOUCH DELICA PLUS [id number]) MISC Use to test blood sugar daily   metFORMIN (GLUCOPHAGE-XR) 500 MG 24 hr tablet Take 500 mg by mouth 2 (two) times daily.   metoprolol succinate (TOPROL-XL) 25 MG 24 hr tablet TAKE 1 AND 1/2 TABLETS BY MOUTH ONCE DAILY   mupirocin ointment (BACTROBAN) 2 % Apply to affected area three times daily for 5 days   ONE  TOUCH ULTRA TEST test strip Use to test blood sugar daily   rosuvastatin (CRESTOR) 10 MG tablet Take 1 tablet (10 mg total) by mouth daily.   sacubitril-valsartan (ENTRESTO) 97-103 MG Take 1 tablet by mouth 2 (two) times daily.   spironolactone (ALDACTONE) 25 MG tablet TAKE ONE TABLET BY MOUTH ONCE DAILY Needs appointment for further refills   No current facility-administered medications for this visit. (Other)      REVIEW OF SYSTEMS:    ALLERGIES Allergies  Allergen Reactions   Elemental Sulfur Rash   Norvasc [Amlodipine Besylate] Rash   Sulfa Antibiotics Rash    PAST MEDICAL HISTORY Past Medical History:  Diagnosis Date   Arthritis    shoulder   Cancer ([place])    skin cancer on nose   Cardiomyopathy ([place])    Chronic systolic CHF (congestive heart failure) ([place])    Diabetes mellitus without complication ([place])    Hyperlipidemia    Hypertension    Past Surgical History:  Procedure Laterality Date   AMPUTATION Right 10/14/2017   Procedure: AMPUTATION  GREAT TOE;  Surgeon: [name], MD;  Location: [facility];  Service: Orthopedics;  Laterality: Right;   AMPUTATION Right 10/18/2017   Procedure:  RIGHT BELOW KNEE AMPUTATION;  Surgeon: [name], MD;  Location: [facility];  Service: Orthopedics;  Laterality: Right;   CYSTOSCOPY WITH INSERTION OF UROLIFT N/A 03/15/2018   Procedure: CYSTOSCOPY WITH INSERTION OF UROLIFT;  Surgeon: Franchot Gallo, MD;  Location[facility];  Service: Urology;  Laterality: N/A;   SHOULDER SURGERY  01/05   STUMP REVISION Right 12/15/2017   Procedure: REVISION RIGHT BELOW KNEE AMPUTATION;  Surgeon: [name], MD;  Location: [facility];  Service: Orthopedics;  Laterality: Right;    FAMILY HISTORY Family History  Problem Relation Age of Onset   Pneumonia Mother        aspiration    SOCIAL HISTORY Social History   Tobacco Use   Smoking status: Never   Smokeless tobacco: Never  Vaping Use   Vaping Use: Never used  Substance Use Topics    Alcohol use: No    Alcohol/week: 0.0 standard drinks   Drug use: Never         OPHTHALMIC EXAM:  Base Eye Exam     Visual Acuity (ETDRS)       Right Left   Dist cc 20/40 -2 20/40 -2   Dist ph cc 20/30 -2 NI    Correction: Glasses         Tonometry (Tonopen, 10:06 AM)       Right Left   Pressure 12 12         Pupils       Pupils Dark Light APD   Right PERRL 5 4 None   Left PERRL 5 4 None         Extraocular Movement       Right Left    Full Full         Neuro/Psych     Oriented x3: Yes   Mood/Affect: Normal         Dilation     Left eye: 1.0% Mydriacyl, 2.5% Phenylephrine @ 10:06 AM           Slit Lamp and Fundus Exam     External Exam       Right Left   External Normal Normal         Slit Lamp Exam       Right Left   Lids/Lashes Normal Normal   Conjunctiva/Sclera White and quiet White and quiet   Cornea Clear Clear   Anterior Chamber Deep and quiet Deep and quiet   Iris Round and reactive Round and reactive   Lens 2+ Nuclear sclerosis, Cortical spokes 2+ 2+ Nuclear sclerosis   Anterior Vitreous Normal Normal         Fundus Exam       Right Left   Posterior Vitreous  Normal   Disc  Normal   C/D Ratio  0.35   Macula  Clinically significant macular edema, Mild clinically significant macular edema   Vessels  Severe NPDR   Periphery  Moderate scatter pattern laser, peripherally, room temporally for more laser            IMAGING AND PROCEDURES  Imaging and Procedures for 08/23/21  Intravitreal Injection, Pharmacologic Agent - OS - Left Eye       Time Out 08/23/2021. 10:41 AM. Confirmed correct patient, procedure, site, and patient consented.   Anesthesia Topical anesthesia was used. Anesthetic medications included Lidocaine 4%.   Procedure Preparation included 10% betadine to eyelids, 5% betadine to ocular surface, Tobramycin 0.3%. A 30 gauge needle was used.  Injection: 2 mg aflibercept 2 MG/0.05ML    Route: Intravitreal, Site: Left Eye   NDC: [id number], Lot: [id number], Waste: 0 mL   Post-op Post injection exam found visual acuity of at least counting fingers. The patient tolerated the procedure well. There were no complications. The patient received written and verbal post procedure care education. Post injection medications included ocuflox.      OCT, Retina - OU - Both Eyes       Right Eye Quality was good. Scan locations included subfoveal. Central Foveal Thickness: 278. Progression has improved. Findings include abnormal foveal contour, cystoid macular edema.   Left Eye Quality was good. Scan locations included subfoveal. Central Foveal Thickness: 312. Progression has improved. Findings include abnormal foveal contour, cystoid macular edema.   Notes Mild perifoveal CME vastly improved overall however and still stable 4 months postinjection left eye OD slightly active CSME superiorly at 4 weeks post injection of Eylea.  Bilateral media opacity, from cataract OU.               ASSESSMENT/PLAN:  Nuclear sclerotic cataract of left eye Moderate impact on acuity  Severe nonproliferative diabetic retinopathy of left eye, with macular edema, associated with type 2 diabetes mellitus (HCC) Much less active CSME with minor perifoveal CSME noted on OCT.Overall vastly improved and maintain at 29-monthfollow-up OS.  Repeat injection today to maintain follow-up next in 4 months OS and evaluate OU at that same time with no planned injection OS  Severe nonproliferative diabetic retinopathy of right eye, with macular edema, associated with type 2 diabetes mellitus ([place] you dilate next in approximately 3 months for evaluation, with fluorescein angiography OU at that time     ICD-10-CM   1. Severe nonproliferative diabetic retinopathy of left eye, with macular edema, associated with type 2 diabetes mellitus (HCC)  [id number] Injection, Pharmacologic Agent - OS -  Left Eye    OCT, Retina - OU - Both Eyes    aflibercept (EYLEA) SOLN 2 mg    2. Nuclear sclerotic cataract of left eye  H25.12     3. Severe nonproliferative diabetic retinopathy of right eye, with macular edema, associated with type 2 diabetes mellitus ([place]  E11.3411       1.  OS vastly improved overall and maintained at 450-monthollow-up.  Repeat injection today to maintain quiescent's of severe NPDR  2.  OU follow-up in roughly 3 to 4 months likely FFA OU at that time, no planned injections  3.  Ophthalmic Meds Ordered this visit:  Meds ordered this encounter  Medications   aflibercept (EYLEA) SOLN 2 mg       Return in about 3 months (around 11/21/2021) for As scheduled, DILATE OU, OPTOS FFA L/R, COLOR FP,, no plan injection.  There are no Patient Instructions on file for this visit.   Explained the diagnoses, plan, and follow up with the patient and they expressed understanding.  Patient expressed understanding of the importance of proper follow up care.   GaClent Demarkankin M.D. Diseases & Surgery of the Retina and Vitreous Retina & Diabetic EyDrummond1/30/23     Abbreviations: M myopia (nearsighted); A astigmatism; H hyperopia (farsighted); P presbyopia; Mrx spectacle prescription;  CTL contact lenses; OD right eye; OS left eye; OU both eyes  XT exotropia; ET esotropia; PEK punctate epithelial keratitis; PEE punctate epithelial erosions; DES dry eye syndrome; MGD meibomian gland dysfunction; ATs artificial tears; PFAT's preservative free artificial tears; NSMulvaneuclear sclerotic cataract; PSC posterior  subcapsular cataract; ERM epi-retinal membrane; PVD posterior vitreous detachment; RD retinal detachment; DM diabetes mellitus; DR diabetic retinopathy; NPDR non-proliferative diabetic retinopathy; PDR proliferative diabetic retinopathy; CSME clinically significant macular edema; DME diabetic macular edema; dbh dot blot hemorrhages; CWS cotton wool spot; POAG primary open  angle glaucoma; C/D cup-to-disc ratio; HVF humphrey visual field; GVF goldmann visual field; OCT optical coherence tomography; IOP intraocular pressure; BRVO Branch retinal vein occlusion; CRVO central retinal vein occlusion; CRAO central retinal artery occlusion; BRAO branch retinal artery occlusion; RT retinal tear; SB scleral buckle; PPV pars plana vitrectomy; VH Vitreous hemorrhage; PRP panretinal laser photocoagulation; IVK intravitreal kenalog; VMT vitreomacular traction; MH Macular hole;  NVD neovascularization of the disc; NVE neovascularization elsewhere; AREDS age related eye disease study; ARMD age related macular degeneration; POAG primary open angle glaucoma; EBMD epithelial/anterior basement membrane dystrophy; ACIOL anterior chamber intraocular lens; IOL intraocular lens; PCIOL posterior chamber intraocular lens; Phaco/IOL phacoemulsification with intraocular lens placement; [place] photorefractive keratectomy; LASIK laser assisted in situ keratomileusis; HTN hypertension; DM diabetes mellitus; COPD chronic obstructive pulmonary disease

## 2021-08-23 NOTE — Assessment & Plan Note (Signed)
Oh you dilate next in approximately 3 months for evaluation, with fluorescein angiography OU at that time

## 2021-08-24 DIAGNOSIS — E113413 Type 2 diabetes mellitus with severe nonproliferative diabetic retinopathy with macular edema, bilateral: Secondary | ICD-10-CM | POA: Diagnosis not present

## 2021-08-31 ENCOUNTER — Other Ambulatory Visit: Payer: Self-pay

## 2021-08-31 ENCOUNTER — Telehealth: Payer: Self-pay | Admitting: Cardiology

## 2021-08-31 MED ORDER — ENTRESTO 97-103 MG PO TABS: 1.0000 | ORAL_TABLET | Freq: Two times a day (BID) | ORAL | 1 refills | Status: DC

## 2021-08-31 MED ORDER — ENTRESTO 97-103 MG PO TABS: 1.0000 | ORAL_TABLET | Freq: Two times a day (BID) | ORAL | 3 refills | Status: DC

## 2021-08-31 NOTE — Telephone Encounter (Signed)
Patient needs refill for entresto, says we have to send in prescription for it to be refilled.

## 2021-09-15 ENCOUNTER — Telehealth: Payer: Self-pay | Admitting: Cardiology

## 2021-09-15 NOTE — Telephone Encounter (Signed)
Patient asking if someone can re-fax his paperwork to [employer] for his entresto prescription. They have not received it yet.

## 2021-09-20 ENCOUNTER — Other Ambulatory Visit: Payer: Self-pay

## 2021-09-20 MED ORDER — IVABRADINE HCL 5 MG PO TABS: 5.0000 mg | ORAL_TABLET | Freq: Two times a day (BID) | ORAL | 3 refills | Status: DC

## 2021-09-21 DIAGNOSIS — E113413 Type 2 diabetes mellitus with severe nonproliferative diabetic retinopathy with macular edema, bilateral: Secondary | ICD-10-CM | POA: Diagnosis not present

## 2021-09-23 ENCOUNTER — Telehealth: Payer: Self-pay | Admitting: Cardiology

## 2021-09-23 NOTE — Telephone Encounter (Signed)
Patient says he has reached out a few times in regards to a fax that was supposed to be sent to [employer] for his entresto. He is wondering what the status on this is. ?

## 2021-09-24 ENCOUNTER — Other Ambulatory Visit: Payer: Self-pay

## 2021-09-24 MED ORDER — ENTRESTO 97-103 MG PO TABS: 1.0000 | ORAL_TABLET | Freq: Two times a day (BID) | ORAL | 3 refills | Status: DC

## 2021-09-24 NOTE — Telephone Encounter (Signed)
Refill has been sent to [employer] [facility]

## 2021-10-22 DIAGNOSIS — E113413 Type 2 diabetes mellitus with severe nonproliferative diabetic retinopathy with macular edema, bilateral: Secondary | ICD-10-CM | POA: Diagnosis not present

## 2021-10-27 ENCOUNTER — Encounter (INDEPENDENT_AMBULATORY_CARE_PROVIDER_SITE_OTHER): Payer: PPO | Admitting: Ophthalmology

## 2021-11-21 DIAGNOSIS — E113413 Type 2 diabetes mellitus with severe nonproliferative diabetic retinopathy with macular edema, bilateral: Secondary | ICD-10-CM | POA: Diagnosis not present

## 2021-11-22 ENCOUNTER — Encounter (INDEPENDENT_AMBULATORY_CARE_PROVIDER_SITE_OTHER): Payer: PPO | Admitting: Ophthalmology

## 2021-11-25 ENCOUNTER — Ambulatory Visit (INDEPENDENT_AMBULATORY_CARE_PROVIDER_SITE_OTHER): Payer: PPO | Admitting: Ophthalmology

## 2021-11-25 ENCOUNTER — Encounter (INDEPENDENT_AMBULATORY_CARE_PROVIDER_SITE_OTHER): Payer: Self-pay | Admitting: Ophthalmology

## 2021-11-25 DIAGNOSIS — H25041 Posterior subcapsular polar age-related cataract, right eye: Secondary | ICD-10-CM | POA: Diagnosis not present

## 2021-11-25 DIAGNOSIS — H2511 Age-related nuclear cataract, right eye: Secondary | ICD-10-CM

## 2021-11-25 DIAGNOSIS — H2512 Age-related nuclear cataract, left eye: Secondary | ICD-10-CM

## 2021-11-25 DIAGNOSIS — E113412 Type 2 diabetes mellitus with severe nonproliferative diabetic retinopathy with macular edema, left eye: Secondary | ICD-10-CM

## 2021-11-25 DIAGNOSIS — E113411 Type 2 diabetes mellitus with severe nonproliferative diabetic retinopathy with macular edema, right eye: Secondary | ICD-10-CM

## 2021-11-25 NOTE — Assessment & Plan Note (Addendum)
Now at 4 months status post most recent injection, no recurrence of CSME at 4 months post most recent injection observe ?

## 2021-11-25 NOTE — Assessment & Plan Note (Addendum)
OS now at 3 months status post injection, OS with center involved CSME slightly recurrent at 72-monthinterval.  We will need to schedule repeat Eylea injection OS soon ?

## 2021-11-25 NOTE — Assessment & Plan Note (Addendum)
New onset central visual axis accounts for acuity but need to have consider strong consideration for cataract surgery with lens implantation right eye for enhancement and maximization of visual potential but also for medical monitoring of his retina ? ?Follow-up with [facility] ?

## 2021-11-25 NOTE — Assessment & Plan Note (Signed)
Patient needs cataract surgery OS ?

## 2021-11-25 NOTE — Assessment & Plan Note (Signed)
Patient needs cataract surgery see comments regarding PSC progression OD ?

## 2021-11-25 NOTE — Progress Notes (Signed)
? ? ?11/25/2021 ? ?  ? ?CHIEF COMPLAINT ?Patient presents for  ?Chief Complaint  ?Patient presents with  ? Diabetic Retinopathy with Macular Edema  ? ? ? ? ?HISTORY OF PRESENT ILLNESS: ?Brian Herring is a 66 y.o. male who presents to the clinic today for:  ? ?HPI   ?3 months dilate OU, FP, FFA L/R, no injection planned. ?Patient states vision is stable and unchanged since last visit. Denies any new floaters or FOL. ? ?Last edited by [name] on 11/25/2021 10:59 AM.  ?  ? ? ?Referring physician: ?Clent Jacks, MD ?[address] ?[address] ?[place],  [place] [address] ? ?HISTORICAL INFORMATION:  ? ?Selected notes from the [facility] ?  ? ?Lab Results  ?Component Value Date  ? HGBA1C 7.0 (H) 03/12/2018  ?  ? ?CURRENT MEDICATIONS: ?No current outpatient medications on file. (Ophthalmic Drugs)  ? ?No current facility-administered medications for this visit. (Ophthalmic Drugs)  ? ?Current Outpatient Medications (Other)  ?Medication Sig  ? blood glucose meter kit and supplies KIT Dispense based on patient and insurance preference. Use up to four times daily as directed. (FOR ICD-10: E 11.65).  ? furosemide (LASIX) 40 MG tablet TAKE ONE TABLET BY MOUTH ONCE DAILY  ? glipiZIDE (GLUCOTROL XL) 5 MG 24 hr tablet Take 5 mg by mouth daily.  ? ivabradine ([name]) 5 MG TABS tablet Take 1 tablet (5 mg total) by mouth 2 (two) times daily with a meal.  ? JARDIANCE 25 MG TABS tablet Take 1 tablet by mouth daily.  ? Lancets (ONETOUCH DELICA PLUS [id number]) MISC Use to test blood sugar daily  ? metFORMIN (GLUCOPHAGE-XR) 500 MG 24 hr tablet Take 500 mg by mouth 2 (two) times daily.  ? metoprolol succinate (TOPROL-XL) 25 MG 24 hr tablet TAKE 1 AND 1/2 TABLETS BY MOUTH ONCE DAILY  ? mupirocin ointment (BACTROBAN) 2 % Apply to affected area three times daily for 5 days  ? ONE TOUCH ULTRA TEST test strip Use to test blood sugar daily  ? rosuvastatin (CRESTOR) 10 MG tablet Take 1 tablet (10 mg total) by mouth daily.  ?  sacubitril-valsartan (ENTRESTO) 97-103 MG Take 1 tablet by mouth 2 (two) times daily.  ? spironolactone (ALDACTONE) 25 MG tablet TAKE ONE TABLET BY MOUTH ONCE DAILY Needs appointment for further refills  ? ?No current facility-administered medications for this visit. (Other)  ? ? ? ? ?REVIEW OF SYSTEMS: ?ROS   ?Negative for: Constitutional, Gastrointestinal, Neurological, Skin, Genitourinary, Musculoskeletal, HENT, Endocrine, Cardiovascular, Eyes, Respiratory, Psychiatric, Allergic/Imm, Heme/Lymph ?Last edited by Hurman Horn, MD on 11/25/2021 11:29 AM.  ?  ? ? ? ?ALLERGIES ?Allergies  ?Allergen Reactions  ? Elemental Sulfur Rash  ? Norvasc [Amlodipine Besylate] Rash  ? Sulfa Antibiotics Rash  ? ? ?PAST MEDICAL HISTORY ?Past Medical History:  ?Diagnosis Date  ? Arthritis   ? shoulder  ? Cancer [facility])   ? skin cancer on nose  ? Cardiomyopathy ([place])   ? Chronic systolic CHF (congestive heart failure) ([place])   ? Diabetes mellitus without complication ([place])   ? Hyperlipidemia   ? Hypertension   ? ?Past Surgical History:  ?Procedure Laterality Date  ? AMPUTATION Right 10/14/2017  ? Procedure: AMPUTATION  GREAT TOE;  Surgeon: [name], MD;  Location: [facility];  Service: Orthopedics;  Laterality: Right;  ? AMPUTATION Right 10/18/2017  ? Procedure: RIGHT BELOW KNEE AMPUTATION;  Surgeon: [name], MD;  Location: [facility];  Service: Orthopedics;  Laterality:  Right;  ? CYSTOSCOPY WITH INSERTION OF UROLIFT N/A 03/15/2018  ? Procedure: CYSTOSCOPY WITH INSERTION OF UROLIFT;  Surgeon: Franchot Gallo, MD;  [facility];  Service: Urology;  Laterality: N/A;  ? SHOULDER SURGERY  01/05  ? STUMP REVISION Right 12/15/2017  ? Procedure: REVISION RIGHT BELOW KNEE AMPUTATION;  Surgeon: [name], MD;  Location: [facility];  Service: Orthopedics;  Laterality: Right;  ? ? ?FAMILY HISTORY ?Family History  ?Problem Relation Age of Onset  ? Pneumonia Mother   ?     aspiration  ? ? ?SOCIAL HISTORY ?Social History  ? ?Tobacco Use  ?  Smoking status: Never  ? Smokeless tobacco: Never  ?Vaping Use  ? Vaping Use: Never used  ?Substance Use Topics  ? Alcohol use: No  ?  Alcohol/week: 0.0 standard drinks  ? Drug use: Never  ? ?  ? ?  ? ?OPHTHALMIC EXAM: ? ?Base Eye Exam   ? ? Visual Acuity (ETDRS)   ? ?   Right Left  ? Dist cc 20/50 -1 20/40 -2  ? Dist ph cc NI NI  ? ? Correction: Glasses  ? ?  ?  ? ? Tonometry (Tonopen, 11:05 AM)   ? ?   Right Left  ? Pressure 13 9  ? ?  ?  ? ? Pupils   ? ?   Pupils Dark Light APD  ? Right PERRL 5 4 None  ? Left PERRL 5 4 None  ? ?  ?  ? ? Extraocular Movement   ? ?   Right Left  ?  Full Full  ? ?  ?  ? ? Neuro/Psych   ? ? Oriented x3: Yes  ? Mood/Affect: Normal  ? ?  ?  ? ? Dilation   ? ? Both eyes: 1.0% Mydriacyl, 2.5% Phenylephrine @ 11:05 AM  ? ?  ?  ? ?  ? ?Slit Lamp and Fundus Exam   ? ? External Exam   ? ?   Right Left  ? External Normal Normal  ? ?  ?  ? ? Slit Lamp Exam   ? ?   Right Left  ? Lids/Lashes Normal Normal  ? Conjunctiva/Sclera White and quiet White and quiet  ? Cornea Clear Clear  ? Anterior Chamber Deep and quiet Deep and quiet  ? Iris Round and reactive Round and reactive  ? Lens 2+ Nuclear sclerosis, Cortical spokes 2+, 2+ Posterior subcapsular cataract in the visual axis 2+ Nuclear sclerosis, trace off axis PSC  ? Anterior Vitreous Normal Normal  ? ?  ?  ? ? Fundus Exam   ? ?   Right Left  ? Posterior Vitreous Posterior vitreous detachment Normal  ? Disc Normal Normal  ? C/D Ratio 0.2 0.35  ? Macula Hemorrhage, Microaneurysms, Mild clinically significant macular edema, no macular thickening Clinically significant macular edema, Mild clinically significant macular edema  ? Vessels Severe nonproliferative diabetic retinopathy Severe NPDR  ? Periphery Normal, moderate scatter PRP nasal.  Room temporally and superiorly for additional laser Moderate scatter pattern laser, peripherally, room temporally for more laser  ? ?  ?  ? ?  ? ? ?IMAGING AND PROCEDURES  ?Imaging and Procedures for  11/25/21 ? ?Color Fundus Photography Optos - OU - Both Eyes   ? ?   ?Right Eye ?Macula : edema, microaneurysms.  ? ?Left Eye ?Disc findings include normal observations. Macula : microaneurysms, edema.  ? ?Notes ?Severe nonproliferative diabetic retinopathy OU, moderate scatter PRP  OD.  Continue with room for additional PRP temporally as well as nasal and superonasal ? ?Moderate scatter inferonasal OS.   ? ?OU CSME now resolved ? ?Vitreous debris centrally OU.  Is not hemorrhage ? ?  ? ?OCT, Retina - OU - Both Eyes   ? ?   ?Right Eye ?Quality was good. Scan locations included subfoveal. Central Foveal Thickness: 264. Progression has improved. Findings include abnormal foveal contour.  ? ?Left Eye ?Quality was good. Scan locations included subfoveal. Central Foveal Thickness: 380. Progression has improved. Findings include abnormal foveal contour, cystoid macular edema.  ? ?Notes ?Mild perifoveal CME vastly improved overall however and still stable 3 months postinjection left eye with minor recurrence ?OD CSME superiorly at 4 weeks post injection of Eylea. ? ?Bilateral media opacity, from cataract OU. ? ? ? ?  ? ? ?  ?  ? ?  ?ASSESSMENT/PLAN: ? ?Severe nonproliferative diabetic retinopathy of right eye, with macular edema, associated with type 2 diabetes mellitus ([place]) ?Now at 4 months status post most recent injection, no recurrence of CSME at 4 months post most recent injection observe ? ?Severe nonproliferative diabetic retinopathy of left eye, with macular edema, associated with type 2 diabetes mellitus ([place]) ?OS now at 3 months status post injection, OS with center involved CSME slightly recurrent at 6-monthinterval.  We will need to schedule repeat Eylea injection OS soon ? ?Nuclear sclerotic cataract of right eye ?Patient needs cataract surgery see comments regarding PSC progression OD ? ?Nuclear sclerotic cataract of left eye ?Patient needs cataract surgery OS ? ?Posterior subcapsular age-related cataract of  right eye ?New onset central visual axis accounts for acuity but need to have consider strong consideration for cataract surgery with lens implantation right eye for enhancement and maximization of visual potential [facility]

## 2021-12-06 ENCOUNTER — Ambulatory Visit (INDEPENDENT_AMBULATORY_CARE_PROVIDER_SITE_OTHER): Payer: PPO | Admitting: Ophthalmology

## 2021-12-06 ENCOUNTER — Encounter (INDEPENDENT_AMBULATORY_CARE_PROVIDER_SITE_OTHER): Payer: Self-pay | Admitting: Ophthalmology

## 2021-12-06 DIAGNOSIS — H2512 Age-related nuclear cataract, left eye: Secondary | ICD-10-CM | POA: Diagnosis not present

## 2021-12-06 DIAGNOSIS — H25041 Posterior subcapsular polar age-related cataract, right eye: Secondary | ICD-10-CM | POA: Diagnosis not present

## 2021-12-06 DIAGNOSIS — E113412 Type 2 diabetes mellitus with severe nonproliferative diabetic retinopathy with macular edema, left eye: Secondary | ICD-10-CM

## 2021-12-06 MED ORDER — AFLIBERCEPT 2MG/0.05ML IZ SOLN FOR KALEIDOSCOPE
2.0000 mg | INTRAVITREAL | Status: AC | PRN
  Administered 2021-12-06: 2 mg via INTRAVITREAL

## 2021-12-06 NOTE — Assessment & Plan Note (Signed)
Center involved CSME has recurred some 3 months after most recent treatment in the left eye with antivegF, will need to reinstitute therapy today OS with antivegF, Eylea.  Resistance to Avastin in the past. ?

## 2021-12-06 NOTE — Progress Notes (Signed)
? ? ?12/06/2021 ? ?  ? ?CHIEF COMPLAINT ?Patient presents for  ?Chief Complaint  ?Patient presents with  ? Diabetic Retinopathy with Macular Edema  ? ? ? ? ?HISTORY OF PRESENT ILLNESS: ?Brian Herring is a 66 y.o. male who presents to the clinic today for:  ? ?HPI   ?10 days for dilate, OS, EYLEA OCT. ?Pt stated vision has been stable. ?Pt denies floaters and FOL. ? ?Last edited by Silvestre Moment on 12/06/2021 10:05 AM.  ?  ? ? ?Referring physician: ?Monna Fam, MD ?[place],  [place] [address] ? ?HISTORICAL INFORMATION:  ? ?Selected notes from the [facility] ?  ? ?Lab Results  ?Component Value Date  ? HGBA1C 7.0 (H) 03/12/2018  ?  ? ?CURRENT MEDICATIONS: ?No current outpatient medications on file. (Ophthalmic Drugs)  ? ?No current facility-administered medications for this visit. (Ophthalmic Drugs)  ? ?Current Outpatient Medications (Other)  ?Medication Sig  ? blood glucose meter kit and supplies KIT Dispense based on patient and insurance preference. Use up to four times daily as directed. (FOR ICD-10: E 11.65).  ? furosemide (LASIX) 40 MG tablet TAKE ONE TABLET BY MOUTH ONCE DAILY  ? glipiZIDE (GLUCOTROL XL) 5 MG 24 hr tablet Take 5 mg by mouth daily.  ? ivabradine ([name]) 5 MG TABS tablet Take 1 tablet (5 mg total) by mouth 2 (two) times daily with a meal.  ? JARDIANCE 25 MG TABS tablet Take 1 tablet by mouth daily.  ? Lancets (ONETOUCH DELICA PLUS [id number]) MISC Use to test blood sugar daily  ? metFORMIN (GLUCOPHAGE-XR) 500 MG 24 hr tablet Take 500 mg by mouth 2 (two) times daily.  ? metoprolol succinate (TOPROL-XL) 25 MG 24 hr tablet TAKE 1 AND 1/2 TABLETS BY MOUTH ONCE DAILY  ? mupirocin ointment (BACTROBAN) 2 % Apply to affected area three times daily for 5 days  ? ONE TOUCH ULTRA TEST test strip Use to test blood sugar daily  ? rosuvastatin (CRESTOR) 10 MG tablet Take 1 tablet (10 mg total) by mouth daily.  ? sacubitril-valsartan (ENTRESTO) 97-103 MG Take 1 tablet by mouth 2 (two)  times daily.  ? spironolactone (ALDACTONE) 25 MG tablet TAKE ONE TABLET BY MOUTH ONCE DAILY Needs appointment for further refills  ? ?No current facility-administered medications for this visit. (Other)  ? ? ? ? ?REVIEW OF SYSTEMS: ?ROS   ?Negative for: Constitutional, Gastrointestinal, Neurological, Skin, Genitourinary, Musculoskeletal, HENT, Endocrine, Cardiovascular, Eyes, Respiratory, Psychiatric, Allergic/Imm, Heme/Lymph ?Last edited by Silvestre Moment on 12/06/2021 10:05 AM.  ?  ? ? ? ?ALLERGIES ?Allergies  ?Allergen Reactions  ? Elemental Sulfur Rash  ? Norvasc [Amlodipine Besylate] Rash  ? Sulfa Antibiotics Rash  ? ? ?PAST MEDICAL HISTORY ?Past Medical History:  ?Diagnosis Date  ? Arthritis   ? shoulder  ? Cancer [facility])   ? skin cancer on nose  ? Cardiomyopathy ([place])   ? Chronic systolic CHF (congestive heart failure) ([place])   ? Diabetes mellitus without complication ([place])   ? Hyperlipidemia   ? Hypertension   ? ?Past Surgical History:  ?Procedure Laterality Date  ? AMPUTATION Right 10/14/2017  ? Procedure: AMPUTATION  GREAT TOE;  Surgeon: [name], MD;  Location: [place];  Service: Orthopedics;  Laterality: Right;  ? AMPUTATION Right 10/18/2017  ? Procedure: RIGHT BELOW KNEE AMPUTATION;  Surgeon: [name], MD;  Location: [place];  Service: Orthopedics;  Laterality: Right;  ? CYSTOSCOPY WITH INSERTION OF UROLIFT N/A 03/15/2018  ? Procedure:  CYSTOSCOPY WITH INSERTION OF UROLIFT;  Surgeon: Franchot Gallo, MD;  Location: [facility];  Service: Urology;  Laterality: N/A;  ? SHOULDER SURGERY  01/05  ? STUMP REVISION Right 12/15/2017  ? Procedure: REVISION RIGHT BELOW KNEE AMPUTATION;  Surgeon: [name], MD;  Location: [place];  Service: Orthopedics;  Laterality: Right;  ? ? ?FAMILY HISTORY ?Family History  ?Problem Relation Age of Onset  ? Pneumonia Mother   ?     aspiration  ? ? ?SOCIAL HISTORY ?Social History  ? ?Tobacco Use  ? Smoking status: Never  ? Smokeless tobacco: Never  ?Vaping Use  ? Vaping Use: Never used   ?Substance Use Topics  ? Alcohol use: No  ?  Alcohol/week: 0.0 standard drinks  ? Drug use: Never  ? ?  ? ?  ? ?OPHTHALMIC EXAM: ? ?Base Eye Exam   ? ? Visual Acuity (ETDRS)   ? ?   Right Left  ? Dist cc 20/70 20/50  ? Dist ph cc 20/40 -2 20/40 -2  ? ? Correction: Glasses  ? ?  ?  ? ? Tonometry (Tonopen, 10:12 AM)   ? ?   Right Left  ? Pressure 15 14  ? ?  ?  ? ? Pupils   ? ?   Pupils APD  ? Right PERRL None  ? Left PERRL None  ? ?  ?  ? ? Visual Fields   ? ?   Left Right  ?  Full Full  ? ?  ?  ? ? Extraocular Movement   ? ?   Right Left  ?  Full Full  ? ?  ?  ? ? Neuro/Psych   ? ? Oriented x3: Yes  ? Mood/Affect: Normal  ? ?  ?  ? ? Dilation   ? ? Left eye: 2.5% Phenylephrine, 1.0% Mydriacyl @ 10:12 AM  ? ?  ?  ? ?  ? ?Slit Lamp and Fundus Exam   ? ? External Exam   ? ?   Right Left  ? External Normal Normal  ? ?  ?  ? ? Slit Lamp Exam   ? ?   Right Left  ? Lids/Lashes Normal Normal  ? Conjunctiva/Sclera White and quiet White and quiet  ? Cornea Clear Clear  ? Anterior Chamber Deep and quiet Deep and quiet  ? Iris Round and reactive Round and reactive  ? Lens 2+ Nuclear sclerosis, Cortical spokes 2+, 2+ Posterior subcapsular cataract in the visual axis 2+ Nuclear sclerosis, trace off axis PSC  ? Anterior Vitreous Normal Normal  ? ?  ?  ? ? Fundus Exam   ? ?   Right Left  ? Posterior Vitreous  Normal  ? Disc  Normal  ? C/D Ratio  0.35  ? Macula  Clinically significant macular edema, Mild clinically significant macular edema  ? Vessels  Severe NPDR  ? Periphery  Moderate scatter pattern laser, peripherally, room temporally for more laser  ? ?  ?  ? ?  ? ? ?IMAGING AND PROCEDURES  ?Imaging and Procedures for 12/06/21 ? ?OCT, Retina - OU - Both Eyes   ? ?   ?Right Eye ?Quality was good. Scan locations included subfoveal. Central Foveal Thickness: 269. Progression has improved. Findings include abnormal foveal contour.  ? ?Left Eye ?Quality was good. Scan locations included subfoveal. Central Foveal Thickness: 449.  Progression has improved. Findings include abnormal foveal contour, cystoid macular edema.  ? ?Notes ?Mild perifoveal CME vastly  improved overall OD ? ?Bilateral media opacity, from cataract OU. ? ?OS center involved CSME recurrence and progression.  Will need in control recent reestablish with intravitreal Eylea OS ? ? ? ?  ? ?Intravitreal Injection, Pharmacologic Agent - OS - Left Eye   ? ?   ?Time Out ?12/06/2021. 10:44 AM. Confirmed correct patient, procedure, site, and patient consented.  ? ?Anesthesia ?Topical anesthesia was used. Anesthetic medications included Lidocaine 4%.  ? ?Procedure ?Preparation included 10% betadine to eyelids, 5% betadine to ocular surface, Tobramycin 0.3%. A 30 gauge needle was used.  ? ?Injection: ?2 mg aflibercept 2 MG/0.05ML ?  Route: Intravitreal, Site: Left Eye ?  Branch: [id number], Lot: [id number], Waste: 0 mL  ? ?Post-op ?Post injection exam found visual acuity of at least counting fingers. The patient tolerated the procedure well. There were no complications. The patient received written and verbal post procedure care education. Post injection medications included ocuflox.  ? ?  ? ? ?  ?  ? ?  ?ASSESSMENT/PLAN: ? ?Severe nonproliferative diabetic retinopathy of left eye, with macular edema, associated with type 2 diabetes mellitus ([place]) ?Center involved CSME has recurred some 3 months after most recent treatment in the left eye with antivegF, will need to reinstitute therapy today OS with antivegF, Eylea.  Resistance to Avastin in the past. ? ?Posterior subcapsular age-related cataract of right eye ?Appropriate to begin cataract surgery with lens implantation consideration and performance ? ?Nuclear sclerotic cataract of left eye ?Progressive NSC also present left eye hampering medical monitoring of the condition as well as best acuity  ? ?  ICD-10-CM   ?1. Severe nonproliferative diabetic retinopathy of left eye, with macular edema, associated with type 2 diabetes mellitus  ([place])  C80.2233 OCT, Retina - OU - Both Eyes  ?  Intravitreal Injection, Pharmacologic Agent - OS - Left Eye  ?  aflibercept (EYLEA) SOLN 2 mg  ?  ?2. Posterior subcapsular age-related cataract of right e

## 2021-12-06 NOTE — Assessment & Plan Note (Signed)
Progressive NSC also present left eye hampering medical monitoring of the condition as well as best acuity ?

## 2021-12-06 NOTE — Assessment & Plan Note (Signed)
Appropriate to begin cataract surgery with lens implantation consideration and performance ?

## 2021-12-13 ENCOUNTER — Ambulatory Visit: Payer: PPO | Admitting: Cardiology

## 2021-12-28 DIAGNOSIS — E113413 Type 2 diabetes mellitus with severe nonproliferative diabetic retinopathy with macular edema, bilateral: Secondary | ICD-10-CM | POA: Diagnosis not present

## 2021-12-28 DIAGNOSIS — E7800 Pure hypercholesterolemia, unspecified: Secondary | ICD-10-CM | POA: Diagnosis not present

## 2021-12-28 DIAGNOSIS — E1169 Type 2 diabetes mellitus with other specified complication: Secondary | ICD-10-CM | POA: Diagnosis not present

## 2021-12-28 DIAGNOSIS — I10 Essential (primary) hypertension: Secondary | ICD-10-CM | POA: Diagnosis not present

## 2021-12-30 ENCOUNTER — Ambulatory Visit: Payer: PPO | Admitting: Cardiology

## 2021-12-30 ENCOUNTER — Encounter: Payer: Self-pay | Admitting: Cardiology

## 2021-12-30 VITALS — BP 115/69 | HR 102 | Temp 98.0°F | Resp 17 | Ht 72.0 in | Wt 227.4 lb

## 2021-12-30 DIAGNOSIS — Z794 Long term (current) use of insulin: Secondary | ICD-10-CM | POA: Diagnosis not present

## 2021-12-30 DIAGNOSIS — I10 Essential (primary) hypertension: Secondary | ICD-10-CM

## 2021-12-30 DIAGNOSIS — I428 Other cardiomyopathies: Secondary | ICD-10-CM | POA: Diagnosis not present

## 2021-12-30 DIAGNOSIS — E1152 Type 2 diabetes mellitus with diabetic peripheral angiopathy with gangrene: Secondary | ICD-10-CM | POA: Diagnosis not present

## 2021-12-30 MED ORDER — METOPROLOL SUCCINATE ER 50 MG PO TB24: 50.0000 mg | ORAL_TABLET | Freq: Every day | ORAL | 3 refills | Status: DC

## 2021-12-30 NOTE — Progress Notes (Signed)
Subjective:   Brian Herring, male    DOB: Jul 15, 1956, 66 y.o.   MRN: [id number]    Chief complaint:  Cardiomyopathy  66 year old Caucasian male with hypertension, uncontrolled type 2 DM, s/p Rt transtibial amputation (2019) for osteomyelitis, nonischemic cardiomyopathy, CKD stage 3-4.  Patient is doing well from cardiac standpoint. He has been out of metoprolol., spironolactone, and corlanor for at least a few weeks. In spite, he has not had any dyspnea, orthopnea, leg edema symptoms.   He has been gardening lately. It appears that he had an inadvertent injury on his left shin. He has been doing dressings himself at home and it has been healing well. Of note, he had an infected wound that led to osteomyelitis that led to amputation on his right leg a few years ago.   Current Outpatient Medications:    blood glucose meter kit and supplies KIT, Dispense based on patient and insurance preference. Use up to four times daily as directed. (FOR ICD-10: E 11.65)., Disp: 1 each, Rfl: 0   furosemide (LASIX) 40 MG tablet, TAKE ONE TABLET BY MOUTH ONCE DAILY, Disp: 90 tablet, Rfl: 1   glipiZIDE (GLUCOTROL XL) 5 MG 24 hr tablet, Take 5 mg by mouth daily., Disp: , Rfl:    ivabradine ([name]) 5 MG TABS tablet, Take 1 tablet (5 mg total) by mouth 2 (two) times daily with a meal., Disp: 180 tablet, Rfl: 3   JARDIANCE 25 MG TABS tablet, Take 1 tablet by mouth daily., Disp: , Rfl:    Lancets (ONETOUCH DELICA PLUS [id number]) MISC, Use to test blood sugar daily, Disp: , Rfl: 3   metFORMIN (GLUCOPHAGE-XR) 500 MG 24 hr tablet, Take 500 mg by mouth 2 (two) times daily., Disp: , Rfl:    metoprolol succinate (TOPROL-XL) 25 MG 24 hr tablet, TAKE 1 AND 1/2 TABLETS BY MOUTH ONCE DAILY, Disp: 135 tablet, Rfl: 2   mupirocin ointment (BACTROBAN) 2 %, Apply to affected area three times daily for 5 days, Disp: , Rfl: 0   ONE TOUCH ULTRA TEST test strip, Use to test blood sugar daily, Disp: , Rfl: 1   rosuvastatin  (CRESTOR) 10 MG tablet, Take 1 tablet (10 mg total) by mouth daily., Disp: 90 tablet, Rfl: 1   sacubitril-valsartan (ENTRESTO) 97-103 MG, Take 1 tablet by mouth 2 (two) times daily., Disp: 180 tablet, Rfl: 3   spironolactone (ALDACTONE) 25 MG tablet, TAKE ONE TABLET BY MOUTH ONCE DAILY Needs appointment for further refills, Disp: 30 tablet, Rfl: 1  Cardiovascular studies:  EKG 12/30/2021: Sinus rhythm 96 bpm Left bundle branch block  Echocardiogram 11/01/2019:  Left ventricle cavity is normal in size. Moderate concentric hypertrophy  of the left ventricle. Mildly depressed LV systolic function with visual  EF 45-50%. Normal global wall motion. Doppler evidence of grade I  (impaired) diastolic dysfunction, normal LAP. Left atrial cavity is normal  in size. Aneurysmal interatrial septum without 2D or color Doppler  evidence of interatrial shunt.  No significant valvular abnormality.  Normal right atrial pressure.  No significant change compared to previous study on 12/06/2018.  ABI 06/21/2017:  This exam reveals normal perfusion of the lower extremity (ABI) with bilateral ABI 1.08 with triphasic waveform (normal).  Lexiscan myoview stress test 04/28/2017: 1. Abnormal pharmacologic stress nuclear study but negative for ischemia.     2. Low risk of hemodynamically significant coronary artery disease. Prognostically, this is a high risk study. 3. Non diagnostic stress EKG due to pharmacologic stress.  4.  Fixed perfusion defect due to diaphragmatic attenuation. The left ventricle is severely dilated. Gated SPECT imaging demonstrates severe global hypokinesis. The gated study showed the ejection fraction was 10-15%. Findings consistent with non-ischemic  cardiomyopathy.  Recent labs: 01/13/2021: Glucose 85, BUN/Cr 51/1.55. EGFR 49. Na/K 133/5.4. Rest of the CMP normal H/H 14/44. MCV 92. Platelets 219 HbA1C 6.9% Chol 103, TG 224, HDL 27, LDL 41  06/29/2020: Glucose 186.  HbA1C  8.1%  10/14/2019: Glucose 110, BUN/Cr 25/1.23. EGFR 62. Na/K 141/5.3.   Review of Systems  Cardiovascular:  Negative for chest pain, dyspnea on exertion, leg swelling, palpitations and syncope.       Vitals:   12/30/21 1136  BP: 115/69  Pulse: (!) 102  Resp: 17  Temp: 98 F (36.7 C)  SpO2: 95%     Objective:    Physical Exam Vitals and nursing note reviewed.  Constitutional:      Appearance: He is well-developed.  Neck:     Vascular: No JVD.  Cardiovascular:     Rate and Rhythm: Normal rate and regular rhythm.     Pulses: Intact distal pulses.     Heart sounds: Normal heart sounds.     Comments: RLE Pulmonary:     Effort: Pulmonary effort is normal.     Breath sounds: Normal breath sounds. No wheezing or rales.  Musculoskeletal:     Left lower leg: Left lower leg edema: Trace.     Comments: Rt transtibial amputation.  Skin:    Comments: Healing wounds X3 on left shin            Assessment & Recommendations:   66 year old Caucasian male with hypertension, uncontrolled type 2 DM, s/p Rt transtibial amputation (2019) for osteomyelitis, nonischemic cardiomyopathy, CKD stage 3-4.  Nonischemic cardiomyopathy: NYHA class I-II. EF improved to 45-50% in 10/2019.  Currently on Entresto 97-103 mg bid. He has been off metoprolol succinate to 37.5 mg daily, spironolactone to 25 mg daily, and corlanor 5 mg bid. for a few weeks. This likely explains his tachycardia. Given that he is still compensated, I think we can discontinue his spironolactone and corlanor. Refilled metoprolol succinate at a higher dose of 50 mg daily.  Hypertension: Controlled.  Type 2 DM: Continue follow up with PCP. Now on Jardiance.   H/o osetomyelitis: S/p right transtibial amputation Walking well with prosthetic leg.  Left shin wound: Inadvertent injury. Healing well. Monitor for now. If any worsening, he should contact his PCP.  CKD stage 3: Stable  F/u in 4 weeks  Brian Herring [name], MD [facility] [place] Pager: [phone] Office: [phone] If no answer Cell [phone]

## 2022-01-05 DIAGNOSIS — Z125 Encounter for screening for malignant neoplasm of prostate: Secondary | ICD-10-CM | POA: Diagnosis not present

## 2022-01-05 DIAGNOSIS — Z1211 Encounter for screening for malignant neoplasm of colon: Secondary | ICD-10-CM | POA: Diagnosis not present

## 2022-01-05 DIAGNOSIS — I10 Essential (primary) hypertension: Secondary | ICD-10-CM | POA: Diagnosis not present

## 2022-01-05 DIAGNOSIS — E113413 Type 2 diabetes mellitus with severe nonproliferative diabetic retinopathy with macular edema, bilateral: Secondary | ICD-10-CM | POA: Diagnosis not present

## 2022-01-05 DIAGNOSIS — Z23 Encounter for immunization: Secondary | ICD-10-CM | POA: Diagnosis not present

## 2022-01-05 DIAGNOSIS — Z0000 Encounter for general adult medical examination without abnormal findings: Secondary | ICD-10-CM | POA: Diagnosis not present

## 2022-01-05 DIAGNOSIS — E7800 Pure hypercholesterolemia, unspecified: Secondary | ICD-10-CM | POA: Diagnosis not present

## 2022-01-05 DIAGNOSIS — D729 Disorder of white blood cells, unspecified: Secondary | ICD-10-CM | POA: Diagnosis not present

## 2022-01-11 ENCOUNTER — Ambulatory Visit (INDEPENDENT_AMBULATORY_CARE_PROVIDER_SITE_OTHER): Payer: PPO | Admitting: Ophthalmology

## 2022-01-11 ENCOUNTER — Encounter (INDEPENDENT_AMBULATORY_CARE_PROVIDER_SITE_OTHER): Payer: Self-pay | Admitting: Ophthalmology

## 2022-01-11 DIAGNOSIS — E113411 Type 2 diabetes mellitus with severe nonproliferative diabetic retinopathy with macular edema, right eye: Secondary | ICD-10-CM

## 2022-01-11 DIAGNOSIS — H2511 Age-related nuclear cataract, right eye: Secondary | ICD-10-CM

## 2022-01-11 DIAGNOSIS — H2512 Age-related nuclear cataract, left eye: Secondary | ICD-10-CM | POA: Diagnosis not present

## 2022-01-11 DIAGNOSIS — E113412 Type 2 diabetes mellitus with severe nonproliferative diabetic retinopathy with macular edema, left eye: Secondary | ICD-10-CM | POA: Diagnosis not present

## 2022-01-11 MED ORDER — FLUORESCEIN SODIUM 10 % IV SOLN
500.0000 mg | INTRAVENOUS | Status: AC | PRN
  Administered 2022-01-11: 500 mg via INTRAVENOUS

## 2022-01-11 MED ORDER — AFLIBERCEPT 2MG/0.05ML IZ SOLN FOR KALEIDOSCOPE
2.0000 mg | INTRAVITREAL | Status: AC | PRN
  Administered 2022-01-11: 2 mg via INTRAVITREAL

## 2022-01-11 NOTE — Assessment & Plan Note (Signed)
OD, center involved CSME has improved.  Last injection into vegF OD 5 months previous.

## 2022-01-11 NOTE — Progress Notes (Signed)
01/11/2022     CHIEF COMPLAINT Patient presents for  Chief Complaint  Patient presents with   Diabetic Retinopathy with Macular Edema      HISTORY OF PRESENT ILLNESS: Brian Herring is a 66 y.o. male who presents to the clinic today for:   HPI   5 weeks for DILATE OU, OPTOS FFA L/R, COLOR FP, OCT, EYLEA, OS. Pt stated vision has been stable. Pt reports the left eye has been stable but the right eye is getting blurry. Pt is scheduled for cataract evaluation for right eye on July 18th.   Last edited by Silvestre Moment on 01/11/2022 10:32 AM.      Referring physician: [name], MD [place],  [place] [id number]  HISTORICAL INFORMATION:   Selected notes from the MEDICAL RECORD NUMBER    Lab Results  Component Value Date   HGBA1C 7.0 (H) 03/12/2018     CURRENT MEDICATIONS: No current outpatient medications on file. (Ophthalmic Drugs)   No current facility-administered medications for this visit. (Ophthalmic Drugs)   Current Outpatient Medications (Other)  Medication Sig   blood glucose meter kit and supplies KIT Dispense based on patient and insurance preference. Use up to four times daily as directed. (FOR ICD-10: E 11.65).   furosemide (LASIX) 40 MG tablet TAKE ONE TABLET BY MOUTH ONCE DAILY   gabapentin (NEURONTIN) 100 MG capsule Take 100 mg by mouth as needed.   glipiZIDE (GLUCOTROL XL) 5 MG 24 hr tablet Take 5 mg by mouth daily.   JARDIANCE 25 MG TABS tablet Take 1 tablet by mouth daily.   Lancets (ONETOUCH DELICA PLUS [id number]) MISC Use to test blood sugar daily   metFORMIN (GLUCOPHAGE-XR) 500 MG 24 hr tablet Take 500 mg by mouth 2 (two) times daily.   metoprolol succinate (TOPROL-XL) 50 MG 24 hr tablet Take 1 tablet (50 mg total) by mouth daily.   mupirocin ointment (BACTROBAN) 2 % 1 application   ONE TOUCH ULTRA TEST test strip Use to test blood sugar daily   rosuvastatin (CRESTOR) 10 MG tablet Take 1 tablet (10 mg total) by mouth daily.    sacubitril-valsartan (ENTRESTO) 97-103 MG Take 1 tablet by mouth 2 (two) times daily.   No current facility-administered medications for this visit. (Other)      REVIEW OF SYSTEMS: ROS   Negative for: Constitutional, Gastrointestinal, Neurological, Skin, Genitourinary, Musculoskeletal, HENT, Endocrine, Cardiovascular, Eyes, Respiratory, Psychiatric, Allergic/Imm, Heme/Lymph Last edited by Silvestre Moment on 01/11/2022 10:28 AM.       ALLERGIES Allergies  Allergen Reactions   Elemental Sulfur Rash   Glipizide     Other reaction(s): intolerant   Norvasc [Amlodipine Besylate] Rash   Sulfa Antibiotics Rash    PAST MEDICAL HISTORY Past Medical History:  Diagnosis Date   Arthritis    shoulder   Cancer ([place])    skin cancer on nose   Cardiomyopathy ([place])    Chronic systolic CHF (congestive heart failure) ([place])    Diabetes mellitus without complication ([place])    Hyperlipidemia    Hypertension    Past Surgical History:  Procedure Laterality Date   AMPUTATION Right 10/14/2017   Procedure: AMPUTATION  GREAT TOE;  Surgeon: [name], MD;  Location: [facility];  Service: Orthopedics;  Laterality: Right;   AMPUTATION Right 10/18/2017   Procedure: RIGHT BELOW KNEE AMPUTATION;  Surgeon: [name], MD;  Location: [facility];  Service: Orthopedics;  Laterality: Right;   CYSTOSCOPY WITH INSERTION OF UROLIFT N/A  03/15/2018   Procedure: CYSTOSCOPY WITH INSERTION OF UROLIFT;  Surgeon: Franchot Gallo, MD;  Location: [facility];  Service: Urology;  Laterality: N/A;   SHOULDER SURGERY  01/05   STUMP REVISION Right 12/15/2017   Procedure: REVISION RIGHT BELOW KNEE AMPUTATION;  Surgeon: [name], MD;  Location: [facility];  Service: Orthopedics;  Laterality: Right;    FAMILY HISTORY Family History  Problem Relation Age of Onset   Pneumonia Mother        aspiration    SOCIAL HISTORY Social History   Tobacco Use   Smoking status: Never   Smokeless tobacco: Never  Vaping Use   Vaping Use: Never  used  Substance Use Topics   Alcohol use: No    Alcohol/week: 0.0 standard drinks of alcohol   Drug use: Never         OPHTHALMIC EXAM:  Base Eye Exam     Visual Acuity (ETDRS)       Right Left   Dist cc 20/100 20/70 -2   Dist ph cc 20/50 -2 NI    Correction: Glasses         Tonometry (Tonopen, 10:37 AM)       Right Left   Pressure 13 14         Pupils       Pupils APD   Right PERRL None   Left PERRL None         Visual Fields       Left Right    Full Full         Extraocular Movement       Right Left    Full Full         Neuro/Psych     Oriented x3: Yes   Mood/Affect: Normal         Dilation     Both eyes: 1.0% Mydriacyl, 2.5% Phenylephrine @ 10:37 AM           Slit Lamp and Fundus Exam     External Exam       Right Left   External Normal Normal         Slit Lamp Exam       Right Left   Lids/Lashes Normal Normal   Conjunctiva/Sclera White and quiet White and quiet   Cornea Clear Clear   Anterior Chamber Deep and quiet Deep and quiet   Iris Round and reactive Round and reactive   Lens 2+ Nuclear sclerosis, Cortical spokes 2+, 2+ Posterior subcapsular cataract in the visual axis 2+ Nuclear sclerosis, trace off axis PSC   Anterior Vitreous Normal Normal         Fundus Exam       Right Left   Posterior Vitreous  Normal   Disc  Normal   C/D Ratio  0.35   Macula  Clinically significant macular edema, Mild clinically significant macular edema   Vessels  Severe NPDR   Periphery  Moderate scatter pattern laser, peripherally, room temporally for more laser            IMAGING AND PROCEDURES  Imaging and Procedures for 01/11/22  OCT, Retina - OU - Both Eyes       Right Eye Central Foveal Thickness: 266. Progression has been stable.   Left Eye Central Foveal Thickness: 407. Progression has improved.   Notes Improved center involved CSME OS currently at follow-up interval of 5 weeks OS with Eylea.   Repeat injection today     Intravitreal  Injection, Pharmacologic Agent - OS - Left Eye       Time Out 01/11/2022. 11:32 AM. Confirmed correct patient, procedure, site, and patient consented.   Anesthesia Topical anesthesia was used. Anesthetic medications included Lidocaine 4%.   Procedure Preparation included 5% betadine to ocular surface, 10% betadine to eyelids, Tobramycin 0.3%. A 30 gauge needle was used.   Injection: 2 mg aflibercept 2 MG/0.05ML   Route: Intravitreal, Site: Left Eye   NDC: [id number], Lot: [id number], Waste: 0 mL   Post-op Post injection exam found visual acuity of at least counting fingers. The patient tolerated the procedure well. There were no complications. The patient received written and verbal post procedure care education. Post injection medications included ocuflox.      Fluorescein Angiography Optos (Transit OS)       Injection: 500 mg Fluorescein Sodium 10 %   Route: Intravenous   NDC: [phone]   Right Eye   Progression has no prior data. Mid/Late phase findings include leakage, microaneurysm.   Left Eye   Progression has been stable. Early phase findings include microaneurysm. Mid/Late phase findings include leakage, microaneurysm. Choroidal neovascularization is not present.   Notes Moderate scatter PRP OU  BiLateral CSME and extensive peripheral nonperfusion noted     Color Fundus Photography Optos - OU - Both Eyes       Right Eye Progression has been stable. Disc findings include normal observations. Macula : edema, microaneurysms.   Left Eye Disc findings include normal observations. Macula : edema, microaneurysms.   Notes Severe nonproliferative diabetic retinopathy OU, moderate scatter PRP OD.  Continue with room for additional PRP temporally as well as nasal and superonasal  Moderate scatter inferonasal OS.    OU CSME   Vitreous debris centrally OU.  Is not hemorrhage              ASSESSMENT/PLAN:  Severe nonproliferative diabetic retinopathy of left eye, with macular edema, associated with type 2 diabetes mellitus ([place]) Center involved CSME improved over the last 5 weeks post Eylea.  Repeat injection today to maintain ongoing improvement  Moderate scatter PRP peripherally, may need peripheral PRP completed  Severe nonproliferative diabetic retinopathy of right eye, with macular edema, associated with type 2 diabetes mellitus ([place]) OD, center involved CSME has improved.  Last injection into vegF OD 5 months previous.  Nuclear sclerotic cataract of left eye Visually significant  Nuclear sclerotic cataract of right eye Visually significant     ICD-10-CM   1. Severe nonproliferative diabetic retinopathy of left eye, with macular edema, associated with type 2 diabetes mellitus ([facility])  F16.3846 OCT, Retina - OU - Both Eyes    Intravitreal Injection, Pharmacologic Agent - OS - Left Eye    Fluorescein Angiography Optos (Transit OS)    Color Fundus Photography Optos - OU - Both Eyes    Fluorescein Sodium 10 % injection 500 mg    aflibercept (EYLEA) SOLN 2 mg    2. Severe nonproliferative diabetic retinopathy of right eye, with macular edema, associated with type 2 diabetes mellitus ([place])  E11.3411     3. Nuclear sclerotic cataract of left eye  H25.12     4. Nuclear sclerotic cataract of right eye  H25.11       1.  2.  3.  Ophthalmic Meds Ordered this visit:  Meds ordered this encounter  Medications   Fluorescein Sodium 10 % injection 500 mg   aflibercept (EYLEA) SOLN 2 mg       Return in about  5 weeks (around 02/15/2022) for dilate, OS, EYLEA OCT.  There are no Patient Instructions on file for this visit.   Explained the diagnoses, plan, and follow up with the patient and they expressed understanding.  Patient expressed understanding of the importance of proper follow up care.   Clent Demark Launa Goedken M.D. Diseases & Surgery of the Retina and  Vitreous Retina & Diabetic [place] 01/11/22     Abbreviations: M myopia (nearsighted); A astigmatism; H hyperopia (farsighted); P presbyopia; Mrx spectacle prescription;  CTL contact lenses; OD right eye; OS left eye; OU both eyes  XT exotropia; ET esotropia; PEK punctate epithelial keratitis; PEE punctate epithelial erosions; DES dry eye syndrome; MGD meibomian gland dysfunction; ATs artificial tears; PFAT's preservative free artificial tears; [place] nuclear sclerotic cataract; PSC posterior subcapsular cataract; ERM epi-retinal membrane; PVD posterior vitreous detachment; RD retinal detachment; DM diabetes mellitus; DR diabetic retinopathy; NPDR non-proliferative diabetic retinopathy; PDR proliferative diabetic retinopathy; CSME clinically significant macular edema; DME diabetic macular edema; dbh dot blot hemorrhages; CWS cotton wool spot; POAG primary open angle glaucoma; C/D cup-to-disc ratio; HVF humphrey visual field; GVF goldmann visual field; OCT optical coherence tomography; IOP intraocular pressure; BRVO Branch retinal vein occlusion; CRVO central retinal vein occlusion; CRAO central retinal artery occlusion; BRAO branch retinal artery occlusion; RT retinal tear; SB scleral buckle; PPV pars plana vitrectomy; VH Vitreous hemorrhage; PRP panretinal laser photocoagulation; IVK intravitreal kenalog; VMT vitreomacular traction; MH Macular hole;  NVD neovascularization of the disc; NVE neovascularization elsewhere; AREDS age related eye disease study; ARMD age related macular degeneration; POAG primary open angle glaucoma; EBMD epithelial/anterior basement membrane dystrophy; ACIOL anterior chamber intraocular lens; IOL intraocular lens; PCIOL posterior chamber intraocular lens; Phaco/IOL phacoemulsification with intraocular lens placement; [place] photorefractive keratectomy; LASIK laser assisted in situ keratomileusis; HTN hypertension; DM diabetes mellitus; COPD chronic obstructive pulmonary disease

## 2022-01-11 NOTE — Assessment & Plan Note (Signed)
Visually significant

## 2022-01-11 NOTE — Assessment & Plan Note (Signed)
Center involved CSME improved over the last 5 weeks post Eylea.  Repeat injection today to maintain ongoing improvement  Moderate scatter PRP peripherally, may need peripheral PRP completed

## 2022-01-12 DIAGNOSIS — Z89511 Acquired absence of right leg below knee: Secondary | ICD-10-CM | POA: Diagnosis not present

## 2022-01-12 DIAGNOSIS — E1169 Type 2 diabetes mellitus with other specified complication: Secondary | ICD-10-CM | POA: Diagnosis not present

## 2022-01-12 DIAGNOSIS — E7800 Pure hypercholesterolemia, unspecified: Secondary | ICD-10-CM | POA: Diagnosis not present

## 2022-01-12 DIAGNOSIS — G546 Phantom limb syndrome with pain: Secondary | ICD-10-CM | POA: Diagnosis not present

## 2022-01-12 DIAGNOSIS — D729 Disorder of white blood cells, unspecified: Secondary | ICD-10-CM | POA: Diagnosis not present

## 2022-01-12 DIAGNOSIS — Z6830 Body mass index (BMI) 30.0-30.9, adult: Secondary | ICD-10-CM | POA: Diagnosis not present

## 2022-01-12 DIAGNOSIS — L02413 Cutaneous abscess of right upper limb: Secondary | ICD-10-CM | POA: Diagnosis not present

## 2022-01-12 DIAGNOSIS — I10 Essential (primary) hypertension: Secondary | ICD-10-CM | POA: Diagnosis not present

## 2022-01-12 DIAGNOSIS — Z1211 Encounter for screening for malignant neoplasm of colon: Secondary | ICD-10-CM | POA: Diagnosis not present

## 2022-01-12 DIAGNOSIS — I420 Dilated cardiomyopathy: Secondary | ICD-10-CM | POA: Diagnosis not present

## 2022-01-12 DIAGNOSIS — E113413 Type 2 diabetes mellitus with severe nonproliferative diabetic retinopathy with macular edema, bilateral: Secondary | ICD-10-CM | POA: Diagnosis not present

## 2022-01-12 DIAGNOSIS — Z0000 Encounter for general adult medical examination without abnormal findings: Secondary | ICD-10-CM | POA: Diagnosis not present

## 2022-01-27 ENCOUNTER — Ambulatory Visit: Payer: PPO | Admitting: Cardiology

## 2022-01-27 ENCOUNTER — Encounter: Payer: Self-pay | Admitting: Cardiology

## 2022-01-27 VITALS — BP 126/76 | HR 86 | Temp 98.5°F | Resp 16 | Ht 72.0 in | Wt 224.4 lb

## 2022-01-27 DIAGNOSIS — E1152 Type 2 diabetes mellitus with diabetic peripheral angiopathy with gangrene: Secondary | ICD-10-CM

## 2022-01-27 DIAGNOSIS — I10 Essential (primary) hypertension: Secondary | ICD-10-CM | POA: Diagnosis not present

## 2022-01-27 DIAGNOSIS — Z794 Long term (current) use of insulin: Secondary | ICD-10-CM | POA: Diagnosis not present

## 2022-01-27 DIAGNOSIS — I428 Other cardiomyopathies: Secondary | ICD-10-CM

## 2022-01-27 MED ORDER — METOPROLOL SUCCINATE ER 50 MG PO TB24: 50.0000 mg | ORAL_TABLET | Freq: Every day | ORAL | 3 refills | Status: DC

## 2022-01-27 MED ORDER — ENTRESTO 97-103 MG PO TABS
1.0000 | ORAL_TABLET | Freq: Two times a day (BID) | ORAL | 3 refills | Status: DC
Start: 2022-01-27 — End: 2023-02-10

## 2022-01-27 NOTE — Progress Notes (Signed)
Subjective:   Brian Herring, male    DOB: July 02, 1956, 66 y.o.   MRN: [id number]    Chief complaint:  Cardiomyopathy  66 year old Caucasian male with hypertension, uncontrolled type 2 DM, s/p Rt transtibial amputation (2019) for osteomyelitis, nonischemic cardiomyopathy, CKD stage 3-4.  Wound on left shin is healing well.  He has no complaints today. Heart rate is improved on metoprolol.  He denies any orthopnea, PND, leg edema symptoms.  Current Outpatient Medications:    blood glucose meter kit and supplies KIT, Dispense based on patient and insurance preference. Use up to four times daily as directed. (FOR ICD-10: E 11.65)., Disp: 1 each, Rfl: 0   furosemide (LASIX) 40 MG tablet, TAKE ONE TABLET BY MOUTH ONCE DAILY, Disp: 90 tablet, Rfl: 1   gabapentin (NEURONTIN) 100 MG capsule, Take 100 mg by mouth as needed., Disp: , Rfl:    glipiZIDE (GLUCOTROL XL) 5 MG 24 hr tablet, Take 5 mg by mouth daily., Disp: , Rfl:    JARDIANCE 25 MG TABS tablet, Take 1 tablet by mouth daily., Disp: , Rfl:    Lancets (ONETOUCH DELICA PLUS [id number]) MISC, Use to test blood sugar daily, Disp: , Rfl: 3   metFORMIN (GLUCOPHAGE-XR) 500 MG 24 hr tablet, Take 500 mg by mouth 2 (two) times daily., Disp: , Rfl:    metoprolol succinate (TOPROL-XL) 50 MG 24 hr tablet, Take 1 tablet (50 mg total) by mouth daily., Disp: 90 tablet, Rfl: 3   mupirocin ointment (BACTROBAN) 2 %, 1 application, Disp: , Rfl:    ONE TOUCH ULTRA TEST test strip, Use to test blood sugar daily, Disp: , Rfl: 1   rosuvastatin (CRESTOR) 10 MG tablet, Take 1 tablet (10 mg total) by mouth daily., Disp: 90 tablet, Rfl: 1   sacubitril-valsartan (ENTRESTO) 97-103 MG, Take 1 tablet by mouth 2 (two) times daily., Disp: 180 tablet, Rfl: 3  Cardiovascular studies:  EKG 12/30/2021: Sinus rhythm 96 bpm Left bundle branch block  Echocardiogram 11/01/2019:  Left ventricle cavity is normal in size. Moderate concentric hypertrophy  of the left ventricle.  Mildly depressed LV systolic function with visual  EF 45-50%. Normal global wall motion. Doppler evidence of grade I  (impaired) diastolic dysfunction, normal LAP. Left atrial cavity is normal  in size. Aneurysmal interatrial septum without 2D or color Doppler  evidence of interatrial shunt.  No significant valvular abnormality.  Normal right atrial pressure.  No significant change compared to previous study on 12/06/2018.  ABI 06/21/2017:  This exam reveals normal perfusion of the lower extremity (ABI) with bilateral ABI 1.08 with triphasic waveform (normal).  Lexiscan myoview stress test 04/28/2017: 1. Abnormal pharmacologic stress nuclear study but negative for ischemia.     2. Low risk of hemodynamically significant coronary artery disease. Prognostically, this is a high risk study. 3. Non diagnostic stress EKG due to pharmacologic stress.  4. Fixed perfusion defect due to diaphragmatic attenuation. The left ventricle is severely dilated. Gated SPECT imaging demonstrates severe global hypokinesis. The gated study showed the ejection fraction was 10-15%. Findings consistent with non-ischemic  cardiomyopathy.  Recent labs: 01/13/2021: Glucose 85, BUN/Cr 51/1.55. EGFR 49. Na/K 133/5.4. Rest of the CMP normal H/H 14/44. MCV 92. Platelets 219 HbA1C 6.9% Chol 103, TG 224, HDL 27, LDL 41  06/29/2020: Glucose 186.  HbA1C 8.1%  10/14/2019: Glucose 110, BUN/Cr 25/1.23. EGFR 62. Na/K 141/5.3.   Review of Systems  Cardiovascular:  Negative for chest pain, dyspnea on exertion, leg swelling, palpitations and syncope.  Vitals:   01/27/22 1303  BP: 126/76  Pulse: 86  Resp: 16  Temp: 98.5 F (36.9 C)  SpO2: 96%     Objective:    Physical Exam Vitals and nursing note reviewed.  Constitutional:      Appearance: He is well-developed.  Neck:     Vascular: No JVD.  Cardiovascular:     Rate and Rhythm: Normal rate and regular rhythm.     Pulses: Intact distal pulses.      Heart sounds: Normal heart sounds.     Comments: RLE Pulmonary:     Effort: Pulmonary effort is normal.     Breath sounds: Normal breath sounds. No wheezing or rales.  Musculoskeletal:     Left lower leg: Left lower leg edema: Trace.     Comments: Rt transtibial amputation.  Skin:    Comments: Healing wounds X3 on left shin         Assessment & Recommendations:   66 year old Caucasian male with hypertension, uncontrolled type 2 DM, s/p Rt transtibial amputation (2019) for osteomyelitis, nonischemic cardiomyopathy, CKD stage 3-4.  Nonischemic cardiomyopathy: NYHA class I-II. EF improved to 45-50% in 10/2019.  Continue Entresto 97-103 mg bid, metoprolol succinate 50 mg daily. No longer on spironolactone, [name].  I do not think he feels them given relative stability of his heart failure.  Therefore, de-escalation of GDMT is appropriate.  Hypertension: Controlled.  Type 2 DM: Continue follow up with PCP. Now on Jardiance.   H/o osetomyelitis: S/p right transtibial amputation Walking well with prosthetic leg.  Left shin wound: Due to inadvertent injury.  Now healed.    CKD stage 3: Stable  F/u in 1 year   Nigel Mormon, MD Pager: [phone] Office: [phone]

## 2022-01-28 ENCOUNTER — Encounter: Payer: Self-pay | Admitting: Cardiology

## 2022-02-08 DIAGNOSIS — H3581 Retinal edema: Secondary | ICD-10-CM | POA: Diagnosis not present

## 2022-02-08 DIAGNOSIS — E113393 Type 2 diabetes mellitus with moderate nonproliferative diabetic retinopathy without macular edema, bilateral: Secondary | ICD-10-CM | POA: Diagnosis not present

## 2022-02-08 DIAGNOSIS — H25813 Combined forms of age-related cataract, bilateral: Secondary | ICD-10-CM | POA: Diagnosis not present

## 2022-02-08 DIAGNOSIS — H524 Presbyopia: Secondary | ICD-10-CM | POA: Diagnosis not present

## 2022-02-12 ENCOUNTER — Other Ambulatory Visit: Payer: Self-pay | Admitting: Cardiology

## 2022-02-15 ENCOUNTER — Ambulatory Visit (INDEPENDENT_AMBULATORY_CARE_PROVIDER_SITE_OTHER): Payer: PPO | Admitting: Ophthalmology

## 2022-02-15 ENCOUNTER — Encounter (INDEPENDENT_AMBULATORY_CARE_PROVIDER_SITE_OTHER): Payer: Self-pay | Admitting: Ophthalmology

## 2022-02-15 DIAGNOSIS — E113411 Type 2 diabetes mellitus with severe nonproliferative diabetic retinopathy with macular edema, right eye: Secondary | ICD-10-CM

## 2022-02-15 DIAGNOSIS — H2511 Age-related nuclear cataract, right eye: Secondary | ICD-10-CM | POA: Diagnosis not present

## 2022-02-15 DIAGNOSIS — H2512 Age-related nuclear cataract, left eye: Secondary | ICD-10-CM

## 2022-02-15 DIAGNOSIS — E113412 Type 2 diabetes mellitus with severe nonproliferative diabetic retinopathy with macular edema, left eye: Secondary | ICD-10-CM

## 2022-02-15 NOTE — Assessment & Plan Note (Signed)
Cataract surgery evaluation underway

## 2022-02-15 NOTE — Progress Notes (Addendum)
02/15/2022     CHIEF COMPLAINT Patient presents for  Chief Complaint  Patient presents with   Diabetic Retinopathy without Macular Edema      HISTORY OF PRESENT ILLNESS: Brian Herring is a 66 y.o. male who presents to the clinic today for:   HPI   History of bilateral CSME.  OD last treated 6 months previous.  Left eye some 6 weeks previous Last edited by Hurman Horn, MD on 02/15/2022 10:43 AM.      Referring physician: [name], MD [place],  [place] [address]  HISTORICAL INFORMATION:   Selected notes from the MEDICAL RECORD NUMBER    Lab Results  Component Value Date   HGBA1C 7.0 (H) 03/12/2018     CURRENT MEDICATIONS: No current outpatient medications on file. (Ophthalmic Drugs)   No current facility-administered medications for this visit. (Ophthalmic Drugs)   Current Outpatient Medications (Other)  Medication Sig   blood glucose meter kit and supplies KIT Dispense based on patient and insurance preference. Use up to four times daily as directed. (FOR ICD-10: E 11.65).   furosemide (LASIX) 40 MG tablet TAKE ONE TABLET BY MOUTH ONCE DAILY   ivabradine ([name]) 5 MG TABS tablet Take 5 mg by mouth 2 (two) times daily with a meal.   JARDIANCE 25 MG TABS tablet Take 1 tablet by mouth daily.   Lancets (ONETOUCH DELICA PLUS ATFTDD22G) MISC Use to test blood sugar daily   metFORMIN (GLUCOPHAGE-XR) 500 MG 24 hr tablet Take 500 mg by mouth 3 (three) times daily. 1tab in the morning, 2 tabs at bedtime   metoprolol succinate (TOPROL-XL) 50 MG 24 hr tablet Take 1 tablet (50 mg total) by mouth daily.   mupirocin ointment (BACTROBAN) 2 % 1 application   ONE TOUCH ULTRA TEST test strip Use to test blood sugar daily   rosuvastatin (CRESTOR) 10 MG tablet Take 1 tablet (10 mg total) by mouth daily.   sacubitril-valsartan (ENTRESTO) 97-103 MG Take 1 tablet by mouth 2 (two) times daily.   No current facility-administered medications for this visit.  (Other)      REVIEW OF SYSTEMS: ROS   Negative for: Constitutional, Gastrointestinal, Neurological, Skin, Genitourinary, Musculoskeletal, HENT, Endocrine, Cardiovascular, Eyes, Respiratory, Psychiatric, Allergic/Imm, Heme/Lymph Last edited by Hurman Horn, MD on 02/15/2022 10:43 AM.       ALLERGIES Allergies  Allergen Reactions   Elemental Sulfur Rash   Glipizide     Other reaction(s): intolerant   Norvasc [Amlodipine Besylate] Rash   Sulfa Antibiotics Rash    PAST MEDICAL HISTORY Past Medical History:  Diagnosis Date   Arthritis    shoulder   Cancer ([place])    skin cancer on nose   Cardiomyopathy ([place])    Chronic systolic CHF (congestive heart failure) ([place])    Diabetes mellitus without complication ([place])    Hyperlipidemia    Hypertension    Past Surgical History:  Procedure Laterality Date   AMPUTATION Right 10/14/2017   Procedure: AMPUTATION  GREAT TOE;  Surgeon: [name], MD;  Location: [facility];  Service: Orthopedics;  Laterality: Right;   AMPUTATION Right 10/18/2017   Procedure: RIGHT BELOW KNEE AMPUTATION;  Surgeon: [name], MD;  Location: [facility];  Service: Orthopedics;  Laterality: Right;   CYSTOSCOPY WITH INSERTION OF UROLIFT N/A 03/15/2018   Procedure: CYSTOSCOPY WITH INSERTION OF UROLIFT;  Surgeon: Franchot Gallo, MD;  Location[facility];  Service: Urology;  Laterality: N/A;   SHOULDER SURGERY  01/05   STUMP REVISION Right 12/15/2017   Procedure: REVISION RIGHT BELOW KNEE AMPUTATION;  Surgeon: [name], MD;  Location: [facility];  Service: Orthopedics;  Laterality: Right;    FAMILY HISTORY Family History  Problem Relation Age of Onset   Pneumonia Mother        aspiration    SOCIAL HISTORY Social History   Tobacco Use   Smoking status: Never   Smokeless tobacco: Never  Vaping Use   Vaping Use: Never used  Substance Use Topics   Alcohol use: No    Alcohol/week: 0.0 standard drinks of alcohol   Drug use: Never         OPHTHALMIC  EXAM:  Base Eye Exam     Visual Acuity (ETDRS)       Right Left   Dist cc 20/150 20/80   Dist ph cc 20/100 20/60 +2    Correction: Glasses         Tonometry (Tonopen, 10:42 AM)       Right Left   Pressure 17 15         Pupils       Pupils APD   Right PERRL None   Left PERRL None         Visual Fields       Left Right    Full Full         Neuro/Psych     Oriented x3: Yes   Mood/Affect: Normal           Slit Lamp and Fundus Exam     External Exam       Right Left   External Normal Normal         Slit Lamp Exam       Right Left   Lids/Lashes Normal Normal   Conjunctiva/Sclera White and quiet White and quiet   Cornea Clear Clear   Anterior Chamber Deep and quiet Deep and quiet   Iris Round and reactive Round and reactive   Lens 2+ Nuclear sclerosis, Cortical spokes 2+, 2+ Posterior subcapsular cataract in the visual axis 2+ Nuclear sclerosis, trace off axis PSC   Anterior Vitreous Normal Normal         Fundus Exam       Right Left   Posterior Vitreous  Normal   Disc  Normal   C/D Ratio  0.35   Macula  Clinically significant macular edema, Mild clinically significant macular edema   Vessels  Severe NPDR   Periphery  Moderate scatter pattern laser, peripherally, room temporally for more laser            IMAGING AND PROCEDURES  Imaging and Procedures for 03/30/22  OCT, Retina - OU - Both Eyes       Right Eye Central Foveal Thickness: 266. Progression has been stable.   Left Eye Central Foveal Thickness: 377. Progression has improved.   Notes Improved center involved CSME OS currently at follow-up interval of 5 weeks OS with Eylea.  Repeat injection today      Intravitreal Injection, Pharmacologic Agent - OS - Left Eye       Time Out 02/15/2022. 11:30 AM. Confirmed correct patient, procedure, site, and patient consented.   Anesthesia Topical anesthesia was used. Anesthetic medications included Lidocaine 4%.    Procedure Preparation included 5% betadine to ocular surface, 10% betadine to eyelids, Tobramycin 0.3%. A 30 gauge needle was used.   Injection: 2 mg aflibercept 2 MG/0.05ML   Route: Intravitreal, Site:  Left Eye   [place]: [id number], Lot: [id number], Expiration date: 10/24/2022, Waste: 0 mL   Post-op Post injection exam found visual acuity of at least counting fingers. The patient tolerated the procedure well. There were no complications. The patient received written and verbal post procedure care education. Post injection medications included ocuflox.              ASSESSMENT/PLAN:  Severe nonproliferative diabetic retinopathy of left eye, with macular edema, associated with type 2 diabetes mellitus (HCC) CSME OS, chronic active.  Today at 6-week follow-up.  Post injection antivegF.  Nuclear sclerotic cataract of left eye Cataract surgery evaluation underway  Nuclear sclerotic cataract of right eye Cataract surgery evaluation underway  Severe nonproliferative diabetic retinopathy of right eye, with macular edema, associated with type 2 diabetes mellitus ([place]) OD, stable over time, will observe     ICD-10-CM   1. Severe nonproliferative diabetic retinopathy of left eye, with macular edema, associated with type 2 diabetes mellitus (HCC)  W10.9323 OCT, Retina - OU - Both Eyes    Intravitreal Injection, Pharmacologic Agent - OS - Left Eye    aflibercept (EYLEA) SOLN 2 mg    2. Nuclear sclerotic cataract of left eye  H25.12     3. Nuclear sclerotic cataract of right eye  H25.11     4. Severe nonproliferative diabetic retinopathy of right eye, with macular edema, associated with type 2 diabetes mellitus ([place])  E11.3411       1.  OS, vastly improved and still improving sooner involved CSME on Eylea from diabetic macular edema with severe NPDR today at 5-week interval post recent evaluation and injection.  Repeat injection today and reevaluate next in 6 weeks  2.  3.   Documentation of injection intravitreal Eylea OD as delivered on 02/15/2022 added on 03-30-2022 confirmed by injection logs.  Ophthalmic Meds Ordered this visit:  Meds ordered this encounter  Medications   aflibercept (EYLEA) SOLN 2 mg       Return in about 6 weeks (around 03/29/2022) for dilate, OS, EYLEA OCT.  There are no Patient Instructions on file for this visit.   Explained the diagnoses, plan, and follow up with the patient and they expressed understanding.  Patient expressed understanding of the importance of proper follow up care.   Clent Demark Kaetlyn Noa M.D. Diseases & Surgery of the Retina and Vitreous Retina & Diabetic [place] 03/30/22     Abbreviations: M myopia (nearsighted); A astigmatism; H hyperopia (farsighted); P presbyopia; Mrx spectacle prescription;  CTL contact lenses; OD right eye; OS left eye; OU both eyes  XT exotropia; ET esotropia; PEK punctate epithelial keratitis; PEE punctate epithelial erosions; DES dry eye syndrome; MGD meibomian gland dysfunction; ATs artificial tears; PFAT's preservative free artificial tears; [place] nuclear sclerotic cataract; PSC posterior subcapsular cataract; ERM epi-retinal membrane; PVD posterior vitreous detachment; RD retinal detachment; DM diabetes mellitus; DR diabetic retinopathy; NPDR non-proliferative diabetic retinopathy; PDR proliferative diabetic retinopathy; CSME clinically significant macular edema; DME diabetic macular edema; dbh dot blot hemorrhages; CWS cotton wool spot; POAG primary open angle glaucoma; C/D cup-to-disc ratio; HVF humphrey visual field; GVF goldmann visual field; OCT optical coherence tomography; IOP intraocular pressure; BRVO Branch retinal vein occlusion; CRVO central retinal vein occlusion; CRAO central retinal artery occlusion; BRAO branch retinal artery occlusion; RT retinal tear; SB scleral buckle; PPV pars plana vitrectomy; VH Vitreous hemorrhage; PRP panretinal laser photocoagulation; IVK intravitreal  kenalog; VMT vitreomacular traction; MH Macular hole;  NVD neovascularization of the disc; NVE neovascularization elsewhere;  AREDS age related eye disease study; ARMD age related macular degeneration; POAG primary open angle glaucoma; EBMD epithelial/anterior basement membrane dystrophy; ACIOL anterior chamber intraocular lens; IOL intraocular lens; PCIOL posterior chamber intraocular lens; Phaco/IOL phacoemulsification with intraocular lens placement; [place] photorefractive keratectomy; LASIK laser assisted in situ keratomileusis; HTN hypertension; DM diabetes mellitus; COPD chronic obstructive pulmonary disease

## 2022-02-15 NOTE — Assessment & Plan Note (Signed)
CSME OS, chronic active.  Today at 6-week follow-up.  Post injection antivegF.

## 2022-02-15 NOTE — Assessment & Plan Note (Signed)
OD, stable over time, will observe

## 2022-03-10 DIAGNOSIS — H3581 Retinal edema: Secondary | ICD-10-CM | POA: Diagnosis not present

## 2022-03-10 DIAGNOSIS — H25813 Combined forms of age-related cataract, bilateral: Secondary | ICD-10-CM | POA: Diagnosis not present

## 2022-03-10 DIAGNOSIS — H25811 Combined forms of age-related cataract, right eye: Secondary | ICD-10-CM | POA: Diagnosis not present

## 2022-03-10 DIAGNOSIS — E113393 Type 2 diabetes mellitus with moderate nonproliferative diabetic retinopathy without macular edema, bilateral: Secondary | ICD-10-CM | POA: Diagnosis not present

## 2022-03-30 ENCOUNTER — Ambulatory Visit (INDEPENDENT_AMBULATORY_CARE_PROVIDER_SITE_OTHER): Payer: PPO | Admitting: Ophthalmology

## 2022-03-30 ENCOUNTER — Encounter (INDEPENDENT_AMBULATORY_CARE_PROVIDER_SITE_OTHER): Payer: Self-pay | Admitting: Ophthalmology

## 2022-03-30 DIAGNOSIS — H2512 Age-related nuclear cataract, left eye: Secondary | ICD-10-CM

## 2022-03-30 DIAGNOSIS — E113412 Type 2 diabetes mellitus with severe nonproliferative diabetic retinopathy with macular edema, left eye: Secondary | ICD-10-CM | POA: Diagnosis not present

## 2022-03-30 DIAGNOSIS — H2511 Age-related nuclear cataract, right eye: Secondary | ICD-10-CM

## 2022-03-30 DIAGNOSIS — E113411 Type 2 diabetes mellitus with severe nonproliferative diabetic retinopathy with macular edema, right eye: Secondary | ICD-10-CM | POA: Diagnosis not present

## 2022-03-30 MED ORDER — AFLIBERCEPT 2MG/0.05ML IZ SOLN FOR KALEIDOSCOPE
2.0000 mg | INTRAVITREAL | Status: AC | PRN
  Administered 2022-03-30: 2 mg via INTRAVITREAL

## 2022-03-30 NOTE — Assessment & Plan Note (Signed)
Scheduled for cataract surgery in November

## 2022-03-30 NOTE — Assessment & Plan Note (Signed)
Scheduled for cataract surgery in the near future

## 2022-03-30 NOTE — Progress Notes (Signed)
03/30/2022     CHIEF COMPLAINT Patient presents for  Chief Complaint  Patient presents with   Diabetic Retinopathy with Macular Edema      HISTORY OF PRESENT ILLNESS: Brian Herring is a 66 y.o. male who presents to the clinic today for:   HPI   OS visual acuity continues to improve.  No new signs of vision change in the right eye.  6 weeks dilate os eylea oct Pt states his vision has been stable Pt denies any new floaters but is having FOL on the lateral side of his eye.  Last edited by Hurman Horn, MD on 03/30/2022 11:32 AM.      Referring physician: Lisabeth Pick, MD [address] [place],  [place] [address]  HISTORICAL INFORMATION:   Selected notes from the MEDICAL RECORD NUMBER    Lab Results  Component Value Date   HGBA1C 7.0 (H) 03/12/2018     CURRENT MEDICATIONS: No current outpatient medications on file. (Ophthalmic Drugs)   No current facility-administered medications for this visit. (Ophthalmic Drugs)   Current Outpatient Medications (Other)  Medication Sig   blood glucose meter kit and supplies KIT Dispense based on patient and insurance preference. Use up to four times daily as directed. (FOR ICD-10: E 11.65).   furosemide (LASIX) 40 MG tablet TAKE ONE TABLET BY MOUTH ONCE DAILY   ivabradine ([name]) 5 MG TABS tablet Take 5 mg by mouth 2 (two) times daily with a meal.   JARDIANCE 25 MG TABS tablet Take 1 tablet by mouth daily.   Lancets (ONETOUCH DELICA PLUS [id number]) MISC Use to test blood sugar daily   metFORMIN (GLUCOPHAGE-XR) 500 MG 24 hr tablet Take 500 mg by mouth 3 (three) times daily. 1tab in the morning, 2 tabs at bedtime   metoprolol succinate (TOPROL-XL) 50 MG 24 hr tablet Take 1 tablet (50 mg total) by mouth daily.   mupirocin ointment (BACTROBAN) 2 % 1 application   ONE TOUCH ULTRA TEST test strip Use to test blood sugar daily   rosuvastatin (CRESTOR) 10 MG tablet Take 1 tablet (10 mg total) by mouth daily.    sacubitril-valsartan (ENTRESTO) 97-103 MG Take 1 tablet by mouth 2 (two) times daily.   No current facility-administered medications for this visit. (Other)      REVIEW OF SYSTEMS: ROS   Negative for: Constitutional, Gastrointestinal, Neurological, Skin, Genitourinary, Musculoskeletal, HENT, Endocrine, Cardiovascular, Eyes, Respiratory, Psychiatric, Allergic/Imm, Heme/Lymph Last edited by Morene Rankins, CMA on 03/30/2022 10:27 AM.       ALLERGIES Allergies  Allergen Reactions   Elemental Sulfur Rash   Glipizide     Other reaction(s): intolerant   Norvasc [Amlodipine Besylate] Rash   Sulfa Antibiotics Rash    PAST MEDICAL HISTORY Past Medical History:  Diagnosis Date   Arthritis    shoulder   Cancer ([place])    skin cancer on nose   Cardiomyopathy ([place])    Chronic systolic CHF (congestive heart failure) ([place])    Diabetes mellitus without complication ([place])    Hyperlipidemia    Hypertension    Past Surgical History:  Procedure Laterality Date   AMPUTATION Right 10/14/2017   Procedure: AMPUTATION  GREAT TOE;  Surgeon: [name], MD;  Location: [facility];  Service: Orthopedics;  Laterality: Right;   AMPUTATION Right 10/18/2017   Procedure: RIGHT BELOW KNEE AMPUTATION;  Surgeon: [name], MD;  Location: [facility];  Service: Orthopedics;  Laterality: Right;   CYSTOSCOPY WITH INSERTION OF UROLIFT  N/A 03/15/2018   Procedure: CYSTOSCOPY WITH INSERTION OF UROLIFT;  Surgeon: Franchot Gallo, MD;  [facility];  Service: Urology;  Laterality: N/A;   SHOULDER SURGERY  01/05   STUMP REVISION Right 12/15/2017   Procedure: REVISION RIGHT BELOW KNEE AMPUTATION;  Surgeon: [name], MD;  Location: [facility];  Service: Orthopedics;  Laterality: Right;    FAMILY HISTORY Family History  Problem Relation Age of Onset   Pneumonia Mother        aspiration    SOCIAL HISTORY Social History   Tobacco Use   Smoking status: Never   Smokeless tobacco: Never  Vaping Use    Vaping Use: Never used  Substance Use Topics   Alcohol use: No    Alcohol/week: 0.0 standard drinks of alcohol   Drug use: Never         OPHTHALMIC EXAM:  Base Eye Exam     Visual Acuity (ETDRS)       Right Left   Dist cc 20/125 20/60 +3    Correction: Glasses         Tonometry (Tonopen, 10:32 AM)       Right Left   Pressure 11 10         Extraocular Movement       Right Left    Ortho Ortho    -- -- --  --  --  -- -- --   -- -- --  --  --  -- -- --           Neuro/Psych     Oriented x3: Yes   Mood/Affect: Normal         Dilation     Left eye: 2.5% Phenylephrine, 1.0% Mydriacyl @ 10:34 AM           Slit Lamp and Fundus Exam     External Exam       Right Left   External Normal Normal         Slit Lamp Exam       Right Left   Lids/Lashes Normal Normal   Conjunctiva/Sclera White and quiet White and quiet   Cornea Clear Clear   Anterior Chamber Deep and quiet Deep and quiet   Iris Round and reactive Round and reactive   Lens 2+ Nuclear sclerosis, Cortical spokes 2+, 2+ Posterior subcapsular cataract in the visual axis 2+ Nuclear sclerosis, trace off axis PSC   Anterior Vitreous Normal Normal         Fundus Exam       Right Left   Posterior Vitreous  Normal   Disc  Normal   C/D Ratio  0.35   Macula  Clinically significant macular edema, Mild clinically significant macular edema   Vessels  Severe NPDR   Periphery  Moderate scatter pattern laser, peripherally, room temporally for more laser            IMAGING AND PROCEDURES  Imaging and Procedures for 03/30/22  OCT, Retina - OU - Both Eyes       Right Eye Progression has been stable. Findings include abnormal foveal contour.   Left Eye Central Foveal Thickness: 329. Progression has improved. Findings include abnormal foveal contour.   Notes No view OD  Improved center involved CSME OS currently at follow-up interval of 5 weeks OS with Eylea.  Repeat  injection today      Intravitreal Injection, Pharmacologic Agent - OS - Left Eye       Time Out 03/30/2022.  11:30 AM. Confirmed correct patient, procedure, site, and patient consented.   Anesthesia Topical anesthesia was used. Anesthetic medications included Lidocaine 4%.   Procedure Preparation included 5% betadine to ocular surface, 10% betadine to eyelids, Tobramycin 0.3%. A 30 gauge needle was used.   Injection: 2 mg aflibercept 2 MG/0.05ML   Route: Intravitreal, Site: Left Eye   [place]: [id number], Lot: [id number], Expiration date: 03/27/2023, Waste: 0 mL   Post-op Post injection exam found visual acuity of at least counting fingers. The patient tolerated the procedure well. There were no complications. The patient received written and verbal post procedure care education. Post injection medications included ocuflox.              ASSESSMENT/PLAN:  Severe nonproliferative diabetic retinopathy of right eye, with macular edema, associated with type 2 diabetes mellitus (HCC) Macular edema OD remains resolved.  No recurrences.  Will observe  Severe nonproliferative diabetic retinopathy of left eye, with macular edema, associated with type 2 diabetes mellitus (HCC) Improved overall CSME 6 weeks post most recent injection Eylea.  We will repeat injection today and extend interval examination next to 10 weeks  Nuclear sclerotic cataract of right eye Scheduled for cataract surgery in the near future  Nuclear sclerotic cataract of left eye Scheduled for cataract surgery in November     ICD-10-CM   1. Severe nonproliferative diabetic retinopathy of left eye, with macular edema, associated with type 2 diabetes mellitus (HCC)  F75.8832 OCT, Retina - OU - Both Eyes    Intravitreal Injection, Pharmacologic Agent - OS - Left Eye    aflibercept (EYLEA) SOLN 2 mg    2. Severe nonproliferative diabetic retinopathy of right eye, with macular edema, associated with type 2 diabetes  mellitus ([place])  E11.3411     3. Nuclear sclerotic cataract of right eye  H25.11     4. Nuclear sclerotic cataract of left eye  H25.12       1.  2.  3.  Ophthalmic Meds Ordered this visit:  Meds ordered this encounter  Medications   aflibercept (EYLEA) SOLN 2 mg       Return in about 10 weeks (around 06/08/2022) for DILATE OU, EYLEA OCT, OS.  There are no Patient Instructions on file for this visit.   Explained the diagnoses, plan, and follow up with the patient and they expressed understanding.  Patient expressed understanding of the importance of proper follow up care.   Clent Demark Talib Headley M.D. Diseases & Surgery of the Retina and Vitreous Retina & Diabetic [place] 03/30/22     Abbreviations: M myopia (nearsighted); A astigmatism; H hyperopia (farsighted); P presbyopia; Mrx spectacle prescription;  CTL contact lenses; OD right eye; OS left eye; OU both eyes  XT exotropia; ET esotropia; PEK punctate epithelial keratitis; PEE punctate epithelial erosions; DES dry eye syndrome; MGD meibomian gland dysfunction; ATs artificial tears; PFAT's preservative free artificial tears; [place] nuclear sclerotic cataract; PSC posterior subcapsular cataract; ERM epi-retinal membrane; PVD posterior vitreous detachment; RD retinal detachment; DM diabetes mellitus; DR diabetic retinopathy; NPDR non-proliferative diabetic retinopathy; PDR proliferative diabetic retinopathy; CSME clinically significant macular edema; DME diabetic macular edema; dbh dot blot hemorrhages; CWS cotton wool spot; POAG primary open angle glaucoma; C/D cup-to-disc ratio; HVF humphrey visual field; GVF goldmann visual field; OCT optical coherence tomography; IOP intraocular pressure; BRVO Branch retinal vein occlusion; CRVO central retinal vein occlusion; CRAO central retinal artery occlusion; BRAO branch retinal artery occlusion; RT retinal tear; SB scleral buckle; PPV pars plana vitrectomy; VH Vitreous  hemorrhage; PRP  panretinal laser photocoagulation; IVK intravitreal kenalog; VMT vitreomacular traction; MH Macular hole;  NVD neovascularization of the disc; NVE neovascularization elsewhere; AREDS age related eye disease study; ARMD age related macular degeneration; POAG primary open angle glaucoma; EBMD epithelial/anterior basement membrane dystrophy; ACIOL anterior chamber intraocular lens; IOL intraocular lens; PCIOL posterior chamber intraocular lens; Phaco/IOL phacoemulsification with intraocular lens placement; [place] photorefractive keratectomy; LASIK laser assisted in situ keratomileusis; HTN hypertension; DM diabetes mellitus; COPD chronic obstructive pulmonary disease

## 2022-03-30 NOTE — Assessment & Plan Note (Signed)
Macular edema OD remains resolved.  No recurrences.  Will observe

## 2022-03-30 NOTE — Assessment & Plan Note (Signed)
Improved overall CSME 6 weeks post most recent injection Eylea.  We will repeat injection today and extend interval examination next to 10 weeks

## 2022-03-30 NOTE — Addendum Note (Signed)
Addended by: Deloria Lair A on: 03/30/2022 11:29 AM   Modules accepted: Orders

## 2022-05-11 DIAGNOSIS — H2701 Aphakia, right eye: Secondary | ICD-10-CM | POA: Diagnosis not present

## 2022-05-11 DIAGNOSIS — H25811 Combined forms of age-related cataract, right eye: Secondary | ICD-10-CM | POA: Diagnosis not present

## 2022-05-11 DIAGNOSIS — H278 Other specified disorders of lens: Secondary | ICD-10-CM | POA: Diagnosis not present

## 2022-05-11 DIAGNOSIS — H268 Other specified cataract: Secondary | ICD-10-CM | POA: Diagnosis not present

## 2022-05-16 DIAGNOSIS — H31093 Other chorioretinal scars, bilateral: Secondary | ICD-10-CM | POA: Diagnosis not present

## 2022-05-16 DIAGNOSIS — E113513 Type 2 diabetes mellitus with proliferative diabetic retinopathy with macular edema, bilateral: Secondary | ICD-10-CM | POA: Diagnosis not present

## 2022-05-16 DIAGNOSIS — H43813 Vitreous degeneration, bilateral: Secondary | ICD-10-CM | POA: Diagnosis not present

## 2022-05-16 DIAGNOSIS — H43393 Other vitreous opacities, bilateral: Secondary | ICD-10-CM | POA: Diagnosis not present

## 2022-05-16 DIAGNOSIS — H59021 Cataract (lens) fragments in eye following cataract surgery, right eye: Secondary | ICD-10-CM | POA: Diagnosis not present

## 2022-05-16 DIAGNOSIS — H2701 Aphakia, right eye: Secondary | ICD-10-CM | POA: Diagnosis not present

## 2022-05-18 DIAGNOSIS — H2701 Aphakia, right eye: Secondary | ICD-10-CM | POA: Diagnosis not present

## 2022-05-18 DIAGNOSIS — H59021 Cataract (lens) fragments in eye following cataract surgery, right eye: Secondary | ICD-10-CM | POA: Diagnosis not present

## 2022-05-18 DIAGNOSIS — E113511 Type 2 diabetes mellitus with proliferative diabetic retinopathy with macular edema, right eye: Secondary | ICD-10-CM | POA: Diagnosis not present

## 2022-05-19 DIAGNOSIS — E1169 Type 2 diabetes mellitus with other specified complication: Secondary | ICD-10-CM | POA: Diagnosis not present

## 2022-05-19 DIAGNOSIS — E7800 Pure hypercholesterolemia, unspecified: Secondary | ICD-10-CM | POA: Diagnosis not present

## 2022-05-19 DIAGNOSIS — E113413 Type 2 diabetes mellitus with severe nonproliferative diabetic retinopathy with macular edema, bilateral: Secondary | ICD-10-CM | POA: Diagnosis not present

## 2022-05-19 DIAGNOSIS — I10 Essential (primary) hypertension: Secondary | ICD-10-CM | POA: Diagnosis not present

## 2022-05-26 DIAGNOSIS — E113513 Type 2 diabetes mellitus with proliferative diabetic retinopathy with macular edema, bilateral: Secondary | ICD-10-CM | POA: Diagnosis not present

## 2022-06-08 ENCOUNTER — Encounter (INDEPENDENT_AMBULATORY_CARE_PROVIDER_SITE_OTHER): Payer: PPO | Admitting: Ophthalmology

## 2022-06-15 ENCOUNTER — Encounter (INDEPENDENT_AMBULATORY_CARE_PROVIDER_SITE_OTHER): Payer: PPO | Admitting: Ophthalmology

## 2022-06-15 DIAGNOSIS — I10 Essential (primary) hypertension: Secondary | ICD-10-CM | POA: Diagnosis not present

## 2022-06-15 DIAGNOSIS — E1169 Type 2 diabetes mellitus with other specified complication: Secondary | ICD-10-CM | POA: Diagnosis not present

## 2022-06-15 DIAGNOSIS — E113413 Type 2 diabetes mellitus with severe nonproliferative diabetic retinopathy with macular edema, bilateral: Secondary | ICD-10-CM | POA: Diagnosis not present

## 2022-06-15 DIAGNOSIS — E7800 Pure hypercholesterolemia, unspecified: Secondary | ICD-10-CM | POA: Diagnosis not present

## 2022-06-20 DIAGNOSIS — E113513 Type 2 diabetes mellitus with proliferative diabetic retinopathy with macular edema, bilateral: Secondary | ICD-10-CM | POA: Diagnosis not present

## 2022-06-21 DIAGNOSIS — H25812 Combined forms of age-related cataract, left eye: Secondary | ICD-10-CM | POA: Diagnosis not present

## 2022-06-27 DIAGNOSIS — E113412 Type 2 diabetes mellitus with severe nonproliferative diabetic retinopathy with macular edema, left eye: Secondary | ICD-10-CM | POA: Diagnosis not present

## 2022-06-27 DIAGNOSIS — E113411 Type 2 diabetes mellitus with severe nonproliferative diabetic retinopathy with macular edema, right eye: Secondary | ICD-10-CM | POA: Diagnosis not present

## 2022-06-27 DIAGNOSIS — H2512 Age-related nuclear cataract, left eye: Secondary | ICD-10-CM | POA: Diagnosis not present

## 2022-06-27 DIAGNOSIS — H401131 Primary open-angle glaucoma, bilateral, mild stage: Secondary | ICD-10-CM | POA: Diagnosis not present

## 2022-07-11 DIAGNOSIS — E113412 Type 2 diabetes mellitus with severe nonproliferative diabetic retinopathy with macular edema, left eye: Secondary | ICD-10-CM | POA: Diagnosis not present

## 2022-07-11 DIAGNOSIS — E113411 Type 2 diabetes mellitus with severe nonproliferative diabetic retinopathy with macular edema, right eye: Secondary | ICD-10-CM | POA: Diagnosis not present

## 2022-07-11 DIAGNOSIS — H2512 Age-related nuclear cataract, left eye: Secondary | ICD-10-CM | POA: Diagnosis not present

## 2022-07-11 DIAGNOSIS — H401131 Primary open-angle glaucoma, bilateral, mild stage: Secondary | ICD-10-CM | POA: Diagnosis not present

## 2022-07-13 DIAGNOSIS — E113413 Type 2 diabetes mellitus with severe nonproliferative diabetic retinopathy with macular edema, bilateral: Secondary | ICD-10-CM | POA: Diagnosis not present

## 2022-07-13 DIAGNOSIS — Z6827 Body mass index (BMI) 27.0-27.9, adult: Secondary | ICD-10-CM | POA: Diagnosis not present

## 2022-07-13 DIAGNOSIS — I10 Essential (primary) hypertension: Secondary | ICD-10-CM | POA: Diagnosis not present

## 2022-07-13 DIAGNOSIS — E7800 Pure hypercholesterolemia, unspecified: Secondary | ICD-10-CM | POA: Diagnosis not present

## 2022-07-13 DIAGNOSIS — Z125 Encounter for screening for malignant neoplasm of prostate: Secondary | ICD-10-CM | POA: Diagnosis not present

## 2022-07-13 DIAGNOSIS — D729 Disorder of white blood cells, unspecified: Secondary | ICD-10-CM | POA: Diagnosis not present

## 2022-07-13 DIAGNOSIS — Z23 Encounter for immunization: Secondary | ICD-10-CM | POA: Diagnosis not present

## 2022-07-13 DIAGNOSIS — E1169 Type 2 diabetes mellitus with other specified complication: Secondary | ICD-10-CM | POA: Diagnosis not present

## 2022-08-12 ENCOUNTER — Other Ambulatory Visit: Payer: Self-pay | Admitting: Cardiology

## 2022-08-22 ENCOUNTER — Telehealth: Payer: Self-pay

## 2022-08-22 NOTE — Telephone Encounter (Signed)
faxed patient assistance for [place] 97/103 on 08/22/2022. LM

## 2022-09-05 DIAGNOSIS — H2512 Age-related nuclear cataract, left eye: Secondary | ICD-10-CM | POA: Diagnosis not present

## 2022-09-05 DIAGNOSIS — E113411 Type 2 diabetes mellitus with severe nonproliferative diabetic retinopathy with macular edema, right eye: Secondary | ICD-10-CM | POA: Diagnosis not present

## 2022-09-05 DIAGNOSIS — E113412 Type 2 diabetes mellitus with severe nonproliferative diabetic retinopathy with macular edema, left eye: Secondary | ICD-10-CM | POA: Diagnosis not present

## 2022-09-05 DIAGNOSIS — H401131 Primary open-angle glaucoma, bilateral, mild stage: Secondary | ICD-10-CM | POA: Diagnosis not present

## 2022-09-14 DIAGNOSIS — H25812 Combined forms of age-related cataract, left eye: Secondary | ICD-10-CM | POA: Diagnosis not present

## 2022-09-14 DIAGNOSIS — H268 Other specified cataract: Secondary | ICD-10-CM | POA: Diagnosis not present

## 2022-10-03 DIAGNOSIS — E113411 Type 2 diabetes mellitus with severe nonproliferative diabetic retinopathy with macular edema, right eye: Secondary | ICD-10-CM | POA: Diagnosis not present

## 2022-10-03 DIAGNOSIS — E113412 Type 2 diabetes mellitus with severe nonproliferative diabetic retinopathy with macular edema, left eye: Secondary | ICD-10-CM | POA: Diagnosis not present

## 2022-10-03 DIAGNOSIS — H401131 Primary open-angle glaucoma, bilateral, mild stage: Secondary | ICD-10-CM | POA: Diagnosis not present

## 2022-11-07 DIAGNOSIS — E113411 Type 2 diabetes mellitus with severe nonproliferative diabetic retinopathy with macular edema, right eye: Secondary | ICD-10-CM | POA: Diagnosis not present

## 2022-11-07 DIAGNOSIS — E113412 Type 2 diabetes mellitus with severe nonproliferative diabetic retinopathy with macular edema, left eye: Secondary | ICD-10-CM | POA: Diagnosis not present

## 2022-11-07 DIAGNOSIS — H401131 Primary open-angle glaucoma, bilateral, mild stage: Secondary | ICD-10-CM | POA: Diagnosis not present

## 2022-11-15 DIAGNOSIS — E113413 Type 2 diabetes mellitus with severe nonproliferative diabetic retinopathy with macular edema, bilateral: Secondary | ICD-10-CM | POA: Diagnosis not present

## 2022-11-15 DIAGNOSIS — I10 Essential (primary) hypertension: Secondary | ICD-10-CM | POA: Diagnosis not present

## 2022-11-15 DIAGNOSIS — E7800 Pure hypercholesterolemia, unspecified: Secondary | ICD-10-CM | POA: Diagnosis not present

## 2022-11-15 DIAGNOSIS — E1169 Type 2 diabetes mellitus with other specified complication: Secondary | ICD-10-CM | POA: Diagnosis not present

## 2022-12-12 DIAGNOSIS — E113411 Type 2 diabetes mellitus with severe nonproliferative diabetic retinopathy with macular edema, right eye: Secondary | ICD-10-CM | POA: Diagnosis not present

## 2022-12-23 DIAGNOSIS — E7800 Pure hypercholesterolemia, unspecified: Secondary | ICD-10-CM | POA: Diagnosis not present

## 2022-12-23 DIAGNOSIS — I10 Essential (primary) hypertension: Secondary | ICD-10-CM | POA: Diagnosis not present

## 2022-12-23 DIAGNOSIS — E113413 Type 2 diabetes mellitus with severe nonproliferative diabetic retinopathy with macular edema, bilateral: Secondary | ICD-10-CM | POA: Diagnosis not present

## 2022-12-23 DIAGNOSIS — E1169 Type 2 diabetes mellitus with other specified complication: Secondary | ICD-10-CM | POA: Diagnosis not present

## 2022-12-27 DIAGNOSIS — E113413 Type 2 diabetes mellitus with severe nonproliferative diabetic retinopathy with macular edema, bilateral: Secondary | ICD-10-CM | POA: Diagnosis not present

## 2022-12-27 DIAGNOSIS — E1169 Type 2 diabetes mellitus with other specified complication: Secondary | ICD-10-CM | POA: Diagnosis not present

## 2022-12-27 DIAGNOSIS — I10 Essential (primary) hypertension: Secondary | ICD-10-CM | POA: Diagnosis not present

## 2022-12-27 DIAGNOSIS — E7800 Pure hypercholesterolemia, unspecified: Secondary | ICD-10-CM | POA: Diagnosis not present

## 2023-01-09 DIAGNOSIS — E1169 Type 2 diabetes mellitus with other specified complication: Secondary | ICD-10-CM | POA: Diagnosis not present

## 2023-01-09 DIAGNOSIS — Z125 Encounter for screening for malignant neoplasm of prostate: Secondary | ICD-10-CM | POA: Diagnosis not present

## 2023-01-09 DIAGNOSIS — E663 Overweight: Secondary | ICD-10-CM | POA: Diagnosis not present

## 2023-01-09 DIAGNOSIS — Z0000 Encounter for general adult medical examination without abnormal findings: Secondary | ICD-10-CM | POA: Diagnosis not present

## 2023-01-09 DIAGNOSIS — Z9181 History of falling: Secondary | ICD-10-CM | POA: Diagnosis not present

## 2023-01-09 DIAGNOSIS — E7800 Pure hypercholesterolemia, unspecified: Secondary | ICD-10-CM | POA: Diagnosis not present

## 2023-01-09 DIAGNOSIS — D729 Disorder of white blood cells, unspecified: Secondary | ICD-10-CM | POA: Diagnosis not present

## 2023-01-09 DIAGNOSIS — I10 Essential (primary) hypertension: Secondary | ICD-10-CM | POA: Diagnosis not present

## 2023-01-12 DIAGNOSIS — E7800 Pure hypercholesterolemia, unspecified: Secondary | ICD-10-CM | POA: Diagnosis not present

## 2023-01-12 DIAGNOSIS — Z0000 Encounter for general adult medical examination without abnormal findings: Secondary | ICD-10-CM | POA: Diagnosis not present

## 2023-01-12 DIAGNOSIS — Z6828 Body mass index (BMI) 28.0-28.9, adult: Secondary | ICD-10-CM | POA: Diagnosis not present

## 2023-01-12 DIAGNOSIS — E1122 Type 2 diabetes mellitus with diabetic chronic kidney disease: Secondary | ICD-10-CM | POA: Diagnosis not present

## 2023-01-12 DIAGNOSIS — N1831 Chronic kidney disease, stage 3a: Secondary | ICD-10-CM | POA: Diagnosis not present

## 2023-01-12 DIAGNOSIS — I420 Dilated cardiomyopathy: Secondary | ICD-10-CM | POA: Diagnosis not present

## 2023-01-12 DIAGNOSIS — Z89511 Acquired absence of right leg below knee: Secondary | ICD-10-CM | POA: Diagnosis not present

## 2023-01-12 DIAGNOSIS — G546 Phantom limb syndrome with pain: Secondary | ICD-10-CM | POA: Diagnosis not present

## 2023-01-17 DIAGNOSIS — H401131 Primary open-angle glaucoma, bilateral, mild stage: Secondary | ICD-10-CM | POA: Diagnosis not present

## 2023-01-17 DIAGNOSIS — Z961 Presence of intraocular lens: Secondary | ICD-10-CM | POA: Diagnosis not present

## 2023-01-17 DIAGNOSIS — E113412 Type 2 diabetes mellitus with severe nonproliferative diabetic retinopathy with macular edema, left eye: Secondary | ICD-10-CM | POA: Diagnosis not present

## 2023-01-17 DIAGNOSIS — E113411 Type 2 diabetes mellitus with severe nonproliferative diabetic retinopathy with macular edema, right eye: Secondary | ICD-10-CM | POA: Diagnosis not present

## 2023-01-19 ENCOUNTER — Other Ambulatory Visit: Payer: Self-pay | Admitting: Cardiology

## 2023-01-19 DIAGNOSIS — I428 Other cardiomyopathies: Secondary | ICD-10-CM

## 2023-01-25 ENCOUNTER — Ambulatory Visit: Payer: PPO | Admitting: Cardiology

## 2023-01-31 DIAGNOSIS — E113412 Type 2 diabetes mellitus with severe nonproliferative diabetic retinopathy with macular edema, left eye: Secondary | ICD-10-CM | POA: Diagnosis not present

## 2023-01-31 DIAGNOSIS — H401131 Primary open-angle glaucoma, bilateral, mild stage: Secondary | ICD-10-CM | POA: Diagnosis not present

## 2023-01-31 DIAGNOSIS — Z961 Presence of intraocular lens: Secondary | ICD-10-CM | POA: Diagnosis not present

## 2023-01-31 DIAGNOSIS — E113411 Type 2 diabetes mellitus with severe nonproliferative diabetic retinopathy with macular edema, right eye: Secondary | ICD-10-CM | POA: Diagnosis not present

## 2023-02-10 ENCOUNTER — Encounter: Payer: Self-pay | Admitting: Cardiology

## 2023-02-10 ENCOUNTER — Ambulatory Visit: Payer: PPO | Admitting: Cardiology

## 2023-02-10 VITALS — BP 117/71 | HR 85 | Resp 16 | Ht 72.0 in | Wt 206.2 lb

## 2023-02-10 DIAGNOSIS — I428 Other cardiomyopathies: Secondary | ICD-10-CM | POA: Diagnosis not present

## 2023-02-10 DIAGNOSIS — I10 Essential (primary) hypertension: Secondary | ICD-10-CM | POA: Diagnosis not present

## 2023-02-10 DIAGNOSIS — Z794 Long term (current) use of insulin: Secondary | ICD-10-CM | POA: Diagnosis not present

## 2023-02-10 DIAGNOSIS — E1152 Type 2 diabetes mellitus with diabetic peripheral angiopathy with gangrene: Secondary | ICD-10-CM | POA: Diagnosis not present

## 2023-02-10 MED ORDER — FUROSEMIDE 40 MG PO TABS
40.0000 mg | ORAL_TABLET | ORAL | 4 refills | Status: AC | PRN
Start: 2023-02-10 — End: ?

## 2023-02-10 MED ORDER — ENTRESTO 97-103 MG PO TABS
1.0000 | ORAL_TABLET | Freq: Two times a day (BID) | ORAL | 3 refills | Status: DC
Start: 2023-02-10 — End: 2023-08-30

## 2023-02-10 MED ORDER — METOPROLOL SUCCINATE ER 50 MG PO TB24: 50.0000 mg | ORAL_TABLET | Freq: Every day | ORAL | 3 refills | Status: AC

## 2023-02-10 NOTE — Progress Notes (Signed)
Subjective:   Brian Herring, male    DOB: Feb 20, 1956, 67 y.o.   MRN: [id number]    Chief complaint:  Cardiomyopathy  67 year old Caucasian male with hypertension, uncontrolled type 2 DM, s/p Rt transtibial amputation (2019) for osteomyelitis, nonischemic cardiomyopathy, CKD stage 3-4.  Patient denies chest pain, shortness of breath, palpitations, leg edema, orthopnea, PND, TIA/syncope. Blood sugars have been elevated recently with A1C of 7.4%. He is working with PCP on the same.     Current Outpatient Medications:    blood glucose meter kit and supplies KIT, Dispense based on patient and insurance preference. Use up to four times daily as directed. (FOR ICD-10: E 11.65)., Disp: 1 each, Rfl: 0   furosemide (LASIX) 40 MG tablet, TAKE ONE TABLET BY MOUTH ONCE DAILY, Disp: 90 tablet, Rfl: 1   JARDIANCE 25 MG TABS tablet, Take 1 tablet by mouth daily., Disp: , Rfl:    Lancets (ONETOUCH DELICA PLUS LANCET33G) MISC, Use to test blood sugar daily, Disp: , Rfl: 3   metFORMIN (GLUCOPHAGE-XR) 500 MG 24 hr tablet, Take 500 mg by mouth 3 (three) times daily. 1tab in the morning, 2 tabs at bedtime, Disp: , Rfl:    metoprolol succinate (TOPROL-XL) 50 MG 24 hr tablet, TAKE ONE TABLET BY MOUTH ONCE DAILY, Disp: 90 tablet, Rfl: 3   mupirocin ointment (BACTROBAN) 2 %, 1 application, Disp: , Rfl:    ONE TOUCH ULTRA TEST test strip, Use to test blood sugar daily, Disp: , Rfl: 1   rosuvastatin (CRESTOR) 10 MG tablet, Take 1 tablet (10 mg total) by mouth daily., Disp: 90 tablet, Rfl: 1   sacubitril-valsartan (ENTRESTO) 97-103 MG, Take 1 tablet by mouth 2 (two) times daily., Disp: 180 tablet, Rfl: 3  Cardiovascular studies:  EKG 02/10/2023: Sinus rhythm 84 bpm Left bundle branch block  Echocardiogram 11/01/2019:  Left ventricle cavity is normal in size. Moderate concentric hypertrophy  of the left ventricle. Mildly depressed LV systolic function with visual  EF 45-50%. Normal global wall motion.  Doppler evidence of grade I  (impaired) diastolic dysfunction, normal LAP. Left atrial cavity is normal  in size. Aneurysmal interatrial septum without 2D or color Doppler  evidence of interatrial shunt.  No significant valvular abnormality.  Normal right atrial pressure.  No significant change compared to previous study on 12/06/2018.  ABI 06/21/2017:  This exam reveals normal perfusion of the lower extremity (ABI) with bilateral ABI 1.08 with triphasic waveform (normal).  Lexiscan myoview stress test 04/28/2017: 1. Abnormal pharmacologic stress nuclear study but negative for ischemia.     2. Low risk of hemodynamically significant coronary artery disease. Prognostically, this is a high risk study. 3. Non diagnostic stress EKG due to pharmacologic stress.  4. Fixed perfusion defect due to diaphragmatic attenuation. The left ventricle is severely dilated. Gated SPECT imaging demonstrates severe global hypokinesis. The gated study showed the ejection fraction was 10-15%. Findings consistent with non-ischemic  cardiomyopathy.  Recent labs: 01/09/2023: Glucose 166, BUN/Cr 42/1.4. EGFR 55. K 5.2. Hb 15.5 HbA1C 7.4% Chol 117, TG 114, HDL 35, LDL 62  Review of Systems  Cardiovascular:  Negative for chest pain, dyspnea on exertion, leg swelling, palpitations and syncope.       Vitals:   02/10/23 1038  BP: 117/71  Pulse: 85  Resp: 16  SpO2: 95%      Objective:    Physical Exam Vitals and nursing note reviewed.  Constitutional:      Appearance: He is well-developed.  Neck:  Vascular: No JVD.  Cardiovascular:     Rate and Rhythm: Normal rate and regular rhythm.     Pulses: Intact distal pulses.     Heart sounds: Normal heart sounds.     Comments: RLE Pulmonary:     Effort: Pulmonary effort is normal.     Breath sounds: Normal breath sounds. No wheezing or rales.  Musculoskeletal:     Left lower leg: No edema.     Comments: Rt transtibial amputation.  Skin:     Comments: Healing wounds X3 on left shin         Assessment & Recommendations:   67 year old Caucasian male with hypertension, uncontrolled type 2 DM, s/p Rt transtibial amputation (2019) for osteomyelitis, nonischemic cardiomyopathy, CKD stage 3-4.   Nonischemic cardiomyopathy: Clinically euvolemic.  EF improved to 45-50% in 10/2019.  Continue Entresto 97-103 mg bid, metoprolol succinate 50 mg daily. Also on Jardiance 25 mg daily. No longer on spironolactone, [name] sinc 2023. I do not think he needs them given relative stability of his heart failure.  Therefore, de-escalation of GDMT is appropriate.  Hypertension: Controlled.  Type 2 DM: Continue follow up with PCP. Now on Jardiance.   H/o osetomyelitis: S/p right transtibial amputation Walking well with prosthetic leg.  CKD stage 3: Stable. Progression since 2021. Continue f/u w/PCP.  F/u in 1 year   Elder Negus, MD Pager: [phone] Office: [phone]

## 2023-02-15 DIAGNOSIS — Z89511 Acquired absence of right leg below knee: Secondary | ICD-10-CM | POA: Diagnosis not present

## 2023-02-28 DIAGNOSIS — E113412 Type 2 diabetes mellitus with severe nonproliferative diabetic retinopathy with macular edema, left eye: Secondary | ICD-10-CM | POA: Diagnosis not present

## 2023-02-28 DIAGNOSIS — E113411 Type 2 diabetes mellitus with severe nonproliferative diabetic retinopathy with macular edema, right eye: Secondary | ICD-10-CM | POA: Diagnosis not present

## 2023-02-28 DIAGNOSIS — H401131 Primary open-angle glaucoma, bilateral, mild stage: Secondary | ICD-10-CM | POA: Diagnosis not present

## 2023-03-14 DIAGNOSIS — H401131 Primary open-angle glaucoma, bilateral, mild stage: Secondary | ICD-10-CM | POA: Diagnosis not present

## 2023-03-14 DIAGNOSIS — E113411 Type 2 diabetes mellitus with severe nonproliferative diabetic retinopathy with macular edema, right eye: Secondary | ICD-10-CM | POA: Diagnosis not present

## 2023-03-14 DIAGNOSIS — E113412 Type 2 diabetes mellitus with severe nonproliferative diabetic retinopathy with macular edema, left eye: Secondary | ICD-10-CM | POA: Diagnosis not present

## 2023-04-04 DIAGNOSIS — H401131 Primary open-angle glaucoma, bilateral, mild stage: Secondary | ICD-10-CM | POA: Diagnosis not present

## 2023-04-04 DIAGNOSIS — E113412 Type 2 diabetes mellitus with severe nonproliferative diabetic retinopathy with macular edema, left eye: Secondary | ICD-10-CM | POA: Diagnosis not present

## 2023-04-04 DIAGNOSIS — E113411 Type 2 diabetes mellitus with severe nonproliferative diabetic retinopathy with macular edema, right eye: Secondary | ICD-10-CM | POA: Diagnosis not present

## 2023-04-19 DIAGNOSIS — H524 Presbyopia: Secondary | ICD-10-CM | POA: Diagnosis not present

## 2023-04-19 DIAGNOSIS — H3581 Retinal edema: Secondary | ICD-10-CM | POA: Diagnosis not present

## 2023-04-19 DIAGNOSIS — Z961 Presence of intraocular lens: Secondary | ICD-10-CM | POA: Diagnosis not present

## 2023-04-19 DIAGNOSIS — H35033 Hypertensive retinopathy, bilateral: Secondary | ICD-10-CM | POA: Diagnosis not present

## 2023-04-19 DIAGNOSIS — E113393 Type 2 diabetes mellitus with moderate nonproliferative diabetic retinopathy without macular edema, bilateral: Secondary | ICD-10-CM | POA: Diagnosis not present

## 2023-04-25 DIAGNOSIS — E113412 Type 2 diabetes mellitus with severe nonproliferative diabetic retinopathy with macular edema, left eye: Secondary | ICD-10-CM | POA: Diagnosis not present

## 2023-04-25 DIAGNOSIS — H401131 Primary open-angle glaucoma, bilateral, mild stage: Secondary | ICD-10-CM | POA: Diagnosis not present

## 2023-04-25 DIAGNOSIS — E113411 Type 2 diabetes mellitus with severe nonproliferative diabetic retinopathy with macular edema, right eye: Secondary | ICD-10-CM | POA: Diagnosis not present

## 2023-05-09 DIAGNOSIS — E113412 Type 2 diabetes mellitus with severe nonproliferative diabetic retinopathy with macular edema, left eye: Secondary | ICD-10-CM | POA: Diagnosis not present

## 2023-05-09 DIAGNOSIS — H401131 Primary open-angle glaucoma, bilateral, mild stage: Secondary | ICD-10-CM | POA: Diagnosis not present

## 2023-05-09 DIAGNOSIS — E113411 Type 2 diabetes mellitus with severe nonproliferative diabetic retinopathy with macular edema, right eye: Secondary | ICD-10-CM | POA: Diagnosis not present

## 2023-05-09 DIAGNOSIS — H35351 Cystoid macular degeneration, right eye: Secondary | ICD-10-CM | POA: Diagnosis not present

## 2023-05-30 DIAGNOSIS — H401131 Primary open-angle glaucoma, bilateral, mild stage: Secondary | ICD-10-CM | POA: Diagnosis not present

## 2023-05-30 DIAGNOSIS — E113411 Type 2 diabetes mellitus with severe nonproliferative diabetic retinopathy with macular edema, right eye: Secondary | ICD-10-CM | POA: Diagnosis not present

## 2023-05-30 DIAGNOSIS — E113412 Type 2 diabetes mellitus with severe nonproliferative diabetic retinopathy with macular edema, left eye: Secondary | ICD-10-CM | POA: Diagnosis not present

## 2023-05-30 DIAGNOSIS — H35351 Cystoid macular degeneration, right eye: Secondary | ICD-10-CM | POA: Diagnosis not present

## 2023-06-08 DIAGNOSIS — H401131 Primary open-angle glaucoma, bilateral, mild stage: Secondary | ICD-10-CM | POA: Diagnosis not present

## 2023-06-08 DIAGNOSIS — E113412 Type 2 diabetes mellitus with severe nonproliferative diabetic retinopathy with macular edema, left eye: Secondary | ICD-10-CM | POA: Diagnosis not present

## 2023-06-08 DIAGNOSIS — H35351 Cystoid macular degeneration, right eye: Secondary | ICD-10-CM | POA: Diagnosis not present

## 2023-06-08 DIAGNOSIS — E113411 Type 2 diabetes mellitus with severe nonproliferative diabetic retinopathy with macular edema, right eye: Secondary | ICD-10-CM | POA: Diagnosis not present

## 2023-07-04 DIAGNOSIS — H401131 Primary open-angle glaucoma, bilateral, mild stage: Secondary | ICD-10-CM | POA: Diagnosis not present

## 2023-07-04 DIAGNOSIS — H35351 Cystoid macular degeneration, right eye: Secondary | ICD-10-CM | POA: Diagnosis not present

## 2023-07-04 DIAGNOSIS — E113411 Type 2 diabetes mellitus with severe nonproliferative diabetic retinopathy with macular edema, right eye: Secondary | ICD-10-CM | POA: Diagnosis not present

## 2023-07-04 DIAGNOSIS — E113412 Type 2 diabetes mellitus with severe nonproliferative diabetic retinopathy with macular edema, left eye: Secondary | ICD-10-CM | POA: Diagnosis not present

## 2023-07-06 DIAGNOSIS — E113412 Type 2 diabetes mellitus with severe nonproliferative diabetic retinopathy with macular edema, left eye: Secondary | ICD-10-CM | POA: Diagnosis not present

## 2023-07-06 DIAGNOSIS — H35351 Cystoid macular degeneration, right eye: Secondary | ICD-10-CM | POA: Diagnosis not present

## 2023-07-06 DIAGNOSIS — E113411 Type 2 diabetes mellitus with severe nonproliferative diabetic retinopathy with macular edema, right eye: Secondary | ICD-10-CM | POA: Diagnosis not present

## 2023-07-06 DIAGNOSIS — H401131 Primary open-angle glaucoma, bilateral, mild stage: Secondary | ICD-10-CM | POA: Diagnosis not present

## 2023-07-06 DIAGNOSIS — H35372 Puckering of macula, left eye: Secondary | ICD-10-CM | POA: Diagnosis not present

## 2023-07-12 DIAGNOSIS — E1169 Type 2 diabetes mellitus with other specified complication: Secondary | ICD-10-CM | POA: Diagnosis not present

## 2023-07-12 DIAGNOSIS — Z23 Encounter for immunization: Secondary | ICD-10-CM | POA: Diagnosis not present

## 2023-07-12 DIAGNOSIS — Z6828 Body mass index (BMI) 28.0-28.9, adult: Secondary | ICD-10-CM | POA: Diagnosis not present

## 2023-07-12 DIAGNOSIS — G546 Phantom limb syndrome with pain: Secondary | ICD-10-CM | POA: Diagnosis not present

## 2023-07-12 DIAGNOSIS — E113413 Type 2 diabetes mellitus with severe nonproliferative diabetic retinopathy with macular edema, bilateral: Secondary | ICD-10-CM | POA: Diagnosis not present

## 2023-07-12 DIAGNOSIS — Z89511 Acquired absence of right leg below knee: Secondary | ICD-10-CM | POA: Diagnosis not present

## 2023-08-23 ENCOUNTER — Telehealth: Payer: Self-pay | Admitting: Cardiology

## 2023-08-23 NOTE — Telephone Encounter (Signed)
Paper Work Dropped Off: Patient Assistance  Date:08/23/2023  Location of paper: Dr Rosemary Holms mail box

## 2023-08-24 NOTE — Telephone Encounter (Signed)
[employer] pt assistance forms/proof of income received and signed by Dr. Rosemary Holms.  Will fax this to [facility] with pt assistance all forms for further management and completion.  Will route this encounter to [facility], to make her aware of this.

## 2023-08-25 ENCOUNTER — Telehealth: Payer: Self-pay

## 2023-08-25 ENCOUNTER — Other Ambulatory Visit (HOSPITAL_COMMUNITY): Payer: Self-pay

## 2023-08-25 NOTE — Telephone Encounter (Signed)
Patient already had a [employer] cardiomyopathy grant that just needed to be renewed. Brian Herring updated and billing instructions faxed to pharmacy. Patient informed via mychart.

## 2023-08-25 NOTE — Telephone Encounter (Signed)
Patient Advocate Encounter   The patient was approved for a [employer] grant that will help cover the cost of Entresto and Jardiance Total amount awarded, $10,000.  Effective: 07/26/23 - 07/24/24   [id number] [id number]   Pharmacy provided with approval and processing information. Patient informed via Dorcas Carrow, CPhT  Pharmacy Patient Advocate Specialist  Direct Number: [phone] Fax: [phone]

## 2023-08-30 ENCOUNTER — Telehealth: Payer: Self-pay

## 2023-08-30 MED ORDER — ENTRESTO 97-103 MG PO TABS: 1.0000 | ORAL_TABLET | Freq: Two times a day (BID) | ORAL | 3 refills | Status: AC

## 2023-08-30 NOTE — Telephone Encounter (Signed)
 Entresto  refill sent to the pts preferred pharmacy [facility] on [address] in [place], [place].

## 2023-08-30 NOTE — Telephone Encounter (Signed)
Patient reached out and said that [facility] is no longer in service and that he needs a script for [facility] sent in to [facility] [address], [place], [place] [address]. Please send in [place] Rx to patient's requested pharmacy.

## 2023-10-09 DIAGNOSIS — E113412 Type 2 diabetes mellitus with severe nonproliferative diabetic retinopathy with macular edema, left eye: Secondary | ICD-10-CM | POA: Diagnosis not present

## 2023-10-09 DIAGNOSIS — E113411 Type 2 diabetes mellitus with severe nonproliferative diabetic retinopathy with macular edema, right eye: Secondary | ICD-10-CM | POA: Diagnosis not present

## 2023-10-25 DIAGNOSIS — E113411 Type 2 diabetes mellitus with severe nonproliferative diabetic retinopathy with macular edema, right eye: Secondary | ICD-10-CM | POA: Diagnosis not present

## 2023-10-25 DIAGNOSIS — H401131 Primary open-angle glaucoma, bilateral, mild stage: Secondary | ICD-10-CM | POA: Diagnosis not present

## 2023-10-25 DIAGNOSIS — H35373 Puckering of macula, bilateral: Secondary | ICD-10-CM | POA: Diagnosis not present

## 2023-10-25 DIAGNOSIS — E113412 Type 2 diabetes mellitus with severe nonproliferative diabetic retinopathy with macular edema, left eye: Secondary | ICD-10-CM | POA: Diagnosis not present

## 2023-10-25 DIAGNOSIS — H35351 Cystoid macular degeneration, right eye: Secondary | ICD-10-CM | POA: Diagnosis not present

## 2023-11-06 DIAGNOSIS — H401131 Primary open-angle glaucoma, bilateral, mild stage: Secondary | ICD-10-CM | POA: Diagnosis not present

## 2023-11-06 DIAGNOSIS — E113411 Type 2 diabetes mellitus with severe nonproliferative diabetic retinopathy with macular edema, right eye: Secondary | ICD-10-CM | POA: Diagnosis not present

## 2023-11-06 DIAGNOSIS — H35351 Cystoid macular degeneration, right eye: Secondary | ICD-10-CM | POA: Diagnosis not present

## 2023-11-06 DIAGNOSIS — H35373 Puckering of macula, bilateral: Secondary | ICD-10-CM | POA: Diagnosis not present

## 2023-11-06 DIAGNOSIS — E113412 Type 2 diabetes mellitus with severe nonproliferative diabetic retinopathy with macular edema, left eye: Secondary | ICD-10-CM | POA: Diagnosis not present

## 2023-11-29 DIAGNOSIS — H401131 Primary open-angle glaucoma, bilateral, mild stage: Secondary | ICD-10-CM | POA: Diagnosis not present

## 2023-11-29 DIAGNOSIS — H35372 Puckering of macula, left eye: Secondary | ICD-10-CM | POA: Diagnosis not present

## 2023-11-29 DIAGNOSIS — H35371 Puckering of macula, right eye: Secondary | ICD-10-CM | POA: Diagnosis not present

## 2023-11-29 DIAGNOSIS — E113412 Type 2 diabetes mellitus with severe nonproliferative diabetic retinopathy with macular edema, left eye: Secondary | ICD-10-CM | POA: Diagnosis not present

## 2023-11-29 DIAGNOSIS — E113411 Type 2 diabetes mellitus with severe nonproliferative diabetic retinopathy with macular edema, right eye: Secondary | ICD-10-CM | POA: Diagnosis not present

## 2023-11-29 DIAGNOSIS — H35351 Cystoid macular degeneration, right eye: Secondary | ICD-10-CM | POA: Diagnosis not present

## 2023-12-04 DIAGNOSIS — H401131 Primary open-angle glaucoma, bilateral, mild stage: Secondary | ICD-10-CM | POA: Diagnosis not present

## 2023-12-04 DIAGNOSIS — H35372 Puckering of macula, left eye: Secondary | ICD-10-CM | POA: Diagnosis not present

## 2023-12-04 DIAGNOSIS — H35351 Cystoid macular degeneration, right eye: Secondary | ICD-10-CM | POA: Diagnosis not present

## 2023-12-04 DIAGNOSIS — E113412 Type 2 diabetes mellitus with severe nonproliferative diabetic retinopathy with macular edema, left eye: Secondary | ICD-10-CM | POA: Diagnosis not present

## 2023-12-04 DIAGNOSIS — E113411 Type 2 diabetes mellitus with severe nonproliferative diabetic retinopathy with macular edema, right eye: Secondary | ICD-10-CM | POA: Diagnosis not present

## 2023-12-04 DIAGNOSIS — H35371 Puckering of macula, right eye: Secondary | ICD-10-CM | POA: Diagnosis not present

## 2024-01-16 ENCOUNTER — Ambulatory Visit: Payer: PPO | Admitting: Cardiology

## 2024-01-18 ENCOUNTER — Encounter: Payer: Self-pay | Admitting: Cardiology

## 2024-01-18 ENCOUNTER — Ambulatory Visit: Attending: Cardiology | Admitting: Cardiology

## 2024-01-18 ENCOUNTER — Ambulatory Visit: Payer: Self-pay | Admitting: Cardiology

## 2024-01-18 VITALS — BP 131/78 | HR 83 | Ht 72.0 in | Wt 206.0 lb

## 2024-01-18 DIAGNOSIS — I5022 Chronic systolic (congestive) heart failure: Secondary | ICD-10-CM

## 2024-01-18 DIAGNOSIS — E118 Type 2 diabetes mellitus with unspecified complications: Secondary | ICD-10-CM | POA: Diagnosis not present

## 2024-01-18 DIAGNOSIS — I10 Essential (primary) hypertension: Secondary | ICD-10-CM

## 2024-01-18 DIAGNOSIS — I428 Other cardiomyopathies: Secondary | ICD-10-CM

## 2024-01-18 NOTE — Progress Notes (Addendum)
 Cardiology Office Note:  .   Date:  01/18/2024  ID:  Brian Herring, DOB [birth year], MRN [id number] PCP: Okey Carlin Redbird, MD  [facility] Providers Cardiologist:  Newman Lawrence, MD PCP: Okey Carlin Redbird, MD  Chief Complaint  Patient presents with   Nonischemic cardiomyopathy     Brian Herring is a 68 y.o. male with hypertension, uncontrolled type 2 DM, s/p Rt transtibial amputation (2019) for osteomyelitis, nonischemic cardiomyopathy, CKD stage 3-4.   History of Present Illness  Patient is doing well.  He stays active in the yard and walking with his prosthetic foot without any complains of chest pain, shortness of breath.  He does not have any leg edema.  His last A1c checked was a year ago was 8.4%.  He knows that he needs to work on his diabetes, fortunately has an appointment for annual physical with his PCP Dr. Okey next week.  His labs will be checked as well.    Vitals:   01/18/24 0918  BP: 131/78  Pulse: 83  SpO2: 95%      Review of Systems  Cardiovascular:  Negative for chest pain, dyspnea on exertion, leg swelling, palpitations and syncope.        Studies Reviewed: SABRA        EKG 01/18/2024: Normal sinus rhythm Left axis deviation Left bundle branch block When compared with ECG of 12-Oct-2017 14:03, No significant change was found  Labs 12/2022: Chol 117, TG 114, HDL 35, LDL 62 HbA1C 8.4% Hb 15.5 Cr 1.23  Echocardiogram 2021: Left ventricle cavity is normal in size. Moderate concentric hypertrophy of the left ventricle. Mildly depressed LV systolic function with visual EF 45-50%. Normal global wall motion. Doppler evidence of grade I (impaired) diastolic dysfunction, normal LAP. Left atrial cavity is normal in size. Aneurysmal interatrial septum without 2D or color Doppler evidence of interatrial shunt. No significant valvular abnormality. Normal right atrial pressure. No significant change compared to previous study on  12/06/2018.   Physical Exam Vitals and nursing note reviewed.  Constitutional:      General: He is not in acute distress. Neck:     Vascular: No JVD.   Cardiovascular:     Rate and Rhythm: Normal rate and regular rhythm.     Heart sounds: Normal heart sounds. No murmur heard. Pulmonary:     Effort: Pulmonary effort is normal.     Breath sounds: Normal breath sounds. No wheezing or rales.   Musculoskeletal:     Comments: Rt BKA      VISIT DIAGNOSES:   ICD-10-CM   1. Nonischemic cardiomyopathy (HCC)  I42.8 EKG 12-Lead    ECHOCARDIOGRAM COMPLETE    2. Primary hypertension  I10 EKG 12-Lead    3. Type 2 diabetes mellitus with complication, without long-term current use of insulin  [facility])  E11.8        Brian Herring is a 68 y.o. male with hypertension, uncontrolled type 2 DM, s/p Rt transtibial amputation (2019) for osteomyelitis, nonischemic cardiomyopathy, CKD stage 3-4.   Assessment & Plan  Nonischemic cardiomyopathy: Clinically euvolemic.  EF improved to 45-50% in 2021.  Continue Entresto  97-103 mg bid, metoprolol  succinate 50 mg daily. Also on Jardiance 25 mg daily. No longer on spironolactone , [name] since 2023, but has stayed stable from heart failure standpoint. Will obtain an echocardiogram now.  If EF is near normal, we can continue Entresto , metoprolol  and Jardiance and see GDMT. This can be refilled by his PCP going forward.  Given  his overall clinical stability from cardiomyopathy standpoint, I will see him as needed, unless echocardiogram shows concerns regarding his EF.    Hypertension: Controlled.   Type 2 DM: A1c was 8.4% in 2024.  Discussed importance of aggressive diabetes control with goal A1c <7%.  Fortunately, he has follow-up with PCP next week.  His labs will be checked at that time 2.     H/o osetomyelitis: S/p right transtibial amputation Walking well with prosthetic leg.   CKD stage 3: Stable. Progression since 2021. Continue f/u  w/PCP.      F/u as needed  Signed, Newman JINNY Lawrence, MD

## 2024-01-18 NOTE — Patient Instructions (Signed)
 Medication Instructions:   Your physician recommends that you continue on your current medications as directed. Please refer to the Current Medication list given to you today.  *If you need a refill on your cardiac medications before your next appointment, please call your pharmacy*    Testing/Procedures:  Your physician has requested that you have an echocardiogram. Echocardiography is a painless test that uses sound waves to create images of your heart. It provides your doctor with information about the size and shape of your heart and how well your heart's chambers and valves are working. This procedure takes approximately one hour. There are no restrictions for this procedure. Please do NOT wear cologne, perfume, aftershave, or lotions (deodorant is allowed). Please arrive 15 minutes prior to your appointment time.  Please note: We ask at that you not bring children with you during ultrasound (echo/ vascular) testing. Due to room size and safety concerns, children are not allowed in the ultrasound rooms during exams. Our front office staff cannot provide observation of children in our lobby area while testing is being conducted. An adult accompanying a patient to their appointment will only be allowed in the ultrasound room at the discretion of the ultrasound technician under special circumstances. We apologize for any inconvenience.    Follow-Up:   AS NEEDED WITH DR. PATWARDHAN

## 2024-01-23 DIAGNOSIS — Z0000 Encounter for general adult medical examination without abnormal findings: Secondary | ICD-10-CM | POA: Diagnosis not present

## 2024-01-23 DIAGNOSIS — E7800 Pure hypercholesterolemia, unspecified: Secondary | ICD-10-CM | POA: Diagnosis not present

## 2024-01-23 DIAGNOSIS — I10 Essential (primary) hypertension: Secondary | ICD-10-CM | POA: Diagnosis not present

## 2024-01-23 DIAGNOSIS — I420 Dilated cardiomyopathy: Secondary | ICD-10-CM | POA: Diagnosis not present

## 2024-01-23 DIAGNOSIS — N1831 Chronic kidney disease, stage 3a: Secondary | ICD-10-CM | POA: Diagnosis not present

## 2024-01-23 DIAGNOSIS — Z6828 Body mass index (BMI) 28.0-28.9, adult: Secondary | ICD-10-CM | POA: Diagnosis not present

## 2024-01-23 DIAGNOSIS — Z89511 Acquired absence of right leg below knee: Secondary | ICD-10-CM | POA: Diagnosis not present

## 2024-01-23 DIAGNOSIS — E113413 Type 2 diabetes mellitus with severe nonproliferative diabetic retinopathy with macular edema, bilateral: Secondary | ICD-10-CM | POA: Diagnosis not present

## 2024-01-23 DIAGNOSIS — E1169 Type 2 diabetes mellitus with other specified complication: Secondary | ICD-10-CM | POA: Diagnosis not present

## 2024-01-24 DIAGNOSIS — H35373 Puckering of macula, bilateral: Secondary | ICD-10-CM | POA: Diagnosis not present

## 2024-01-24 DIAGNOSIS — H401131 Primary open-angle glaucoma, bilateral, mild stage: Secondary | ICD-10-CM | POA: Diagnosis not present

## 2024-01-24 DIAGNOSIS — E113411 Type 2 diabetes mellitus with severe nonproliferative diabetic retinopathy with macular edema, right eye: Secondary | ICD-10-CM | POA: Diagnosis not present

## 2024-01-24 DIAGNOSIS — E113412 Type 2 diabetes mellitus with severe nonproliferative diabetic retinopathy with macular edema, left eye: Secondary | ICD-10-CM | POA: Diagnosis not present

## 2024-01-24 DIAGNOSIS — H35351 Cystoid macular degeneration, right eye: Secondary | ICD-10-CM | POA: Diagnosis not present

## 2024-01-29 DIAGNOSIS — H35351 Cystoid macular degeneration, right eye: Secondary | ICD-10-CM | POA: Diagnosis not present

## 2024-01-29 DIAGNOSIS — H35372 Puckering of macula, left eye: Secondary | ICD-10-CM | POA: Diagnosis not present

## 2024-01-29 DIAGNOSIS — E113412 Type 2 diabetes mellitus with severe nonproliferative diabetic retinopathy with macular edema, left eye: Secondary | ICD-10-CM | POA: Diagnosis not present

## 2024-01-29 DIAGNOSIS — H35371 Puckering of macula, right eye: Secondary | ICD-10-CM | POA: Diagnosis not present

## 2024-01-29 DIAGNOSIS — H401131 Primary open-angle glaucoma, bilateral, mild stage: Secondary | ICD-10-CM | POA: Diagnosis not present

## 2024-01-29 DIAGNOSIS — E113411 Type 2 diabetes mellitus with severe nonproliferative diabetic retinopathy with macular edema, right eye: Secondary | ICD-10-CM | POA: Diagnosis not present

## 2024-02-09 ENCOUNTER — Ambulatory Visit: Payer: Self-pay | Admitting: Cardiology

## 2024-02-28 DIAGNOSIS — H35351 Cystoid macular degeneration, right eye: Secondary | ICD-10-CM | POA: Diagnosis not present

## 2024-02-28 DIAGNOSIS — H401131 Primary open-angle glaucoma, bilateral, mild stage: Secondary | ICD-10-CM | POA: Diagnosis not present

## 2024-02-28 DIAGNOSIS — E113411 Type 2 diabetes mellitus with severe nonproliferative diabetic retinopathy with macular edema, right eye: Secondary | ICD-10-CM | POA: Diagnosis not present

## 2024-02-28 DIAGNOSIS — H35373 Puckering of macula, bilateral: Secondary | ICD-10-CM | POA: Diagnosis not present

## 2024-02-28 DIAGNOSIS — E113412 Type 2 diabetes mellitus with severe nonproliferative diabetic retinopathy with macular edema, left eye: Secondary | ICD-10-CM | POA: Diagnosis not present

## 2024-03-06 ENCOUNTER — Ambulatory Visit (HOSPITAL_COMMUNITY)
Admission: RE | Admit: 2024-03-06 | Discharge: 2024-03-06 | Disposition: A | Discharge status: Home / Self Care | Source: Ambulatory Visit | Attending: Cardiology | Admitting: Cardiology

## 2024-03-06 DIAGNOSIS — I428 Other cardiomyopathies: Secondary | ICD-10-CM | POA: Diagnosis not present

## 2024-03-06 LAB — ECHOCARDIOGRAM COMPLETE
Area-P 1/2: 5.2 cm2
S' Lateral: 4 cm

## 2024-03-08 NOTE — Progress Notes (Signed)
 EF seems to have dropped slightly since 2021.  Currently on Crestor, metoprolol succinate, and Jardiance.  May need to get back on spironolactone plus minus [name]..  As needed at last visit, but we will likely need to get him back for regular follow-up EF normalized again.  Recommend follow-up with Pharm.D. for uptitration of GDMT and repeat echocardiogram in 3 months.  If EF does not improve, may need heart cath at that time.  Thanks MJP

## 2024-03-13 DIAGNOSIS — E113411 Type 2 diabetes mellitus with severe nonproliferative diabetic retinopathy with macular edema, right eye: Secondary | ICD-10-CM | POA: Diagnosis not present

## 2024-03-13 DIAGNOSIS — H401131 Primary open-angle glaucoma, bilateral, mild stage: Secondary | ICD-10-CM | POA: Diagnosis not present

## 2024-03-13 DIAGNOSIS — H35373 Puckering of macula, bilateral: Secondary | ICD-10-CM | POA: Diagnosis not present

## 2024-03-13 DIAGNOSIS — H35351 Cystoid macular degeneration, right eye: Secondary | ICD-10-CM | POA: Diagnosis not present

## 2024-03-13 DIAGNOSIS — E113412 Type 2 diabetes mellitus with severe nonproliferative diabetic retinopathy with macular edema, left eye: Secondary | ICD-10-CM | POA: Diagnosis not present

## 2024-03-19 NOTE — Progress Notes (Signed)
 Appointment made on 04/16/24 with PharmD.

## 2024-04-03 DIAGNOSIS — H35371 Puckering of macula, right eye: Secondary | ICD-10-CM | POA: Diagnosis not present

## 2024-04-03 DIAGNOSIS — H401131 Primary open-angle glaucoma, bilateral, mild stage: Secondary | ICD-10-CM | POA: Diagnosis not present

## 2024-04-03 DIAGNOSIS — E113412 Type 2 diabetes mellitus with severe nonproliferative diabetic retinopathy with macular edema, left eye: Secondary | ICD-10-CM | POA: Diagnosis not present

## 2024-04-03 DIAGNOSIS — E113411 Type 2 diabetes mellitus with severe nonproliferative diabetic retinopathy with macular edema, right eye: Secondary | ICD-10-CM | POA: Diagnosis not present

## 2024-04-03 DIAGNOSIS — H35372 Puckering of macula, left eye: Secondary | ICD-10-CM | POA: Diagnosis not present

## 2024-04-03 DIAGNOSIS — H35351 Cystoid macular degeneration, right eye: Secondary | ICD-10-CM | POA: Diagnosis not present

## 2024-04-09 DIAGNOSIS — E113412 Type 2 diabetes mellitus with severe nonproliferative diabetic retinopathy with macular edema, left eye: Secondary | ICD-10-CM | POA: Diagnosis not present

## 2024-04-09 DIAGNOSIS — E113411 Type 2 diabetes mellitus with severe nonproliferative diabetic retinopathy with macular edema, right eye: Secondary | ICD-10-CM | POA: Diagnosis not present

## 2024-04-16 ENCOUNTER — Ambulatory Visit: Attending: Cardiology | Admitting: Pharmacist Clinician (PhC)/ Clinical Pharmacy Specialist

## 2024-04-16 ENCOUNTER — Encounter: Payer: Self-pay | Admitting: Pharmacist Clinician (PhC)/ Clinical Pharmacy Specialist

## 2024-04-16 VITALS — BP 122/76

## 2024-04-16 DIAGNOSIS — I5022 Chronic systolic (congestive) heart failure: Secondary | ICD-10-CM

## 2024-04-16 DIAGNOSIS — I428 Other cardiomyopathies: Secondary | ICD-10-CM | POA: Diagnosis not present

## 2024-04-16 MED ORDER — SPIRONOLACTONE 25 MG PO TABS: 12.5000 mg | ORAL_TABLET | Freq: Every day | ORAL | 3 refills | Status: AC

## 2024-04-16 NOTE — Progress Notes (Signed)
 Office Visit    Patient Name: Brian Herring Date of Encounter: 04/16/2024  Primary Care Provider:  Okey Carlin Redbird, MD Primary Cardiologist:  None  Chief Complaint    Heart Failure Medication Titration - EF 30-35%% (by echo 03/06/24)  Significant Past Medical History   HTN On metoprolol  succ, entresto   DM2 7/25 A1c 7.2, on rybelsus, metformin, emapgliflozin  HLD 7/25 LDL 69 on rosuvastatin  10    Allergies  Allergen Reactions   Elemental Sulfur Rash   Glipizide     Other reaction(s): intolerant   Norvasc  [Amlodipine  Besylate] Rash   Sulfa  Antibiotics Rash    History of Present Illness    Brian Herring is a 68 y.o. male patient of Dr Elmira, in the office today to discuss heart failure medication.  He had been on 3/4 GDMT (no spironolactone ).  His echocardiograms in 2020 and 201 were stable in the 40% range, however the most recent (August 2025) showed a drip to 25-30%.    Blood Pressure Goal:  130/80  GDMT: ACEI/ARB/ARNI [x] Yes [] No Entresto  97/103 mg bid  Beta blocker [x] Yes [] No Metoprolol  succ 50 mg daily  MRA [] Yes [x] No Was previously on spironolactone  (d/c 2023)  SGLT2 inhibitor [x] Yes [] No Empagliflozin 25 mg daily     Social Hx:      Tobacco: no  Alcohol: no   Accessory Clinical Findings    Lab Results  Component Value Date   CREATININE 1.23 10/14/2019   BUN 25 10/14/2019   NA 141 10/14/2019   K 5.3 (H) 10/14/2019   CL 99 10/14/2019   CO2 24 10/14/2019   Lab Results  Component Value Date   ALT 27 01/08/2018   AST 30 01/08/2018   ALKPHOS 98 01/08/2018   BILITOT 0.3 01/08/2018   Lab Results  Component Value Date   HGBA1C 7.0 (H) 03/12/2018    Home Medications/Allergies    Current Outpatient Medications  Medication Sig Dispense Refill   blood glucose meter kit and supplies KIT Dispense based on patient and insurance preference. Use up to four times daily as directed. (FOR ICD-10: E 11.65). 1 each 0   furosemide  (LASIX ) 40  MG tablet Take 1 tablet (40 mg total) by mouth as needed. 60 tablet 4   JARDIANCE 25 MG TABS tablet Take 1 tablet by mouth daily.     Lancets (ONETOUCH DELICA PLUS LANCET33G) MISC Use to test blood sugar daily  3   metFORMIN (GLUCOPHAGE-XR) 500 MG 24 hr tablet Take 500 mg by mouth 3 (three) times daily. 1tab in the morning, 2 tabs at bedtime     metoprolol  succinate (TOPROL -XL) 50 MG 24 hr tablet Take 1 tablet (50 mg total) by mouth daily. 90 tablet 3   mupirocin ointment (BACTROBAN) 2 % 1 application     ONE TOUCH ULTRA TEST test strip Use to test blood sugar daily  1   rosuvastatin  (CRESTOR ) 10 MG tablet Take 1 tablet (10 mg total) by mouth daily. 90 tablet 1   RYBELSUS 7 MG TABS Take 1 tablet by mouth every morning.     sacubitril -valsartan  (ENTRESTO ) 97-103 MG Take 1 tablet by mouth 2 (two) times daily. 180 tablet 3   spironolactone  (ALDACTONE ) 25 MG tablet Take 0.5 tablets (12.5 mg total) by mouth daily. 45 tablet 3   No current facility-administered medications for this visit.     Allergies  Allergen Reactions   Elemental Sulfur Rash   Glipizide     Other reaction(s): intolerant  Norvasc  [Amlodipine  Besylate] Rash   Sulfa  Antibiotics Rash       Assessment & Plan      Chronic systolic CHF (congestive heart failure) (HCC) Assessment: BP in office today is 122/76 Patient with HFrEF  (30-35% by echo 8/25) Tolerates current medications without any side effects Denies SOB, palpitation, chest pain, headaches,or edema No concerns with regards to cost of medications, compliance, ability to pick up at pharmacy Reviewed HF diagnosis and purpose of GDMT.  Reviewed mechanism of action of the 4 medications and side effects/benefits of each.  Answered all patient questions.  Reiterated the importance of regular exercise and low salt diet  Plan: GDMT ACEI/ARB/ARNI Entresto  97/103 bid   Beta blocker Metoprolol  succ 50 mg daily   MRA Spironolactone  12.5 mg daily New start today   SGLT2 Empagliflozin 25 mg daily    Labs orderd:  BMET in 10 days Follow up with PharmD in 3-4 weeks   Allean Mink PharmD CPP [facility]  [address] [place] [address] [place], [place] [address] [phone]

## 2024-04-16 NOTE — Patient Instructions (Signed)
 Follow up appointment: Tuesday October 21 at 9:15 am  Go to the lab in 10 DAYS TO CHECK POTASSIUM AND KIDNEY FUNCTION  Take your meds as follows: START SPIRONOLACTONE  12.5 MG (1/2 TABLET) ONCE DAILY CONTINUE WITH ALL OTHER MEDICATIONS  Check your blood pressure at home daily and keep record of the readings.  Your blood pressure goal is < 130/80   To check your pressure at home you will need to:  1. Sit up in a chair, with feet flat on the floor and back supported. Do not cross your ankles or legs. 2. Rest your left arm so that the cuff is about heart level. If the cuff goes on your upper arm,  then just relax the arm on the table, arm of the chair or your lap. If you have a wrist cuff, we  suggest relaxing your wrist against your chest (think of it as Pledging the Flag with the  wrong arm).  3. Place the cuff snugly around your arm, about 1 inch above the crook of your elbow. The  cords should be inside the groove of your elbow.  4. Sit quietly, with the cuff in place, for about 5 minutes. After that 5 minutes press the power  button to start a reading. 5. Do not talk or move while the reading is taking place.  6. Record your readings on a sheet of paper. Although most cuffs have a memory, it is often  easier to see a pattern developing when the numbers are all in front of you.  7. You can repeat the reading after 1-3 minutes if it is recommended  Make sure your bladder is empty and you have not had caffeine or tobacco within the last 30 min  Always bring your blood pressure log with you to your appointments. If you have not brought your monitor in to be double checked for accuracy, please bring it to your next appointment.  You can find a list of quality blood pressure cuffs at [email or web address]  Important lifestyle changes to control high blood pressure  Intervention  Effect on the BP  Lose extra pounds and watch your waistline Weight loss is one of the most effective lifestyle  changes for controlling blood pressure. If you're overweight or obese, losing even a small amount of weight can help reduce blood pressure. Blood pressure might go down by about 1 millimeter of mercury (mm Hg) with each kilogram (about 2.2 pounds) of weight lost.  Exercise regularly As a general goal, aim for at least 30 minutes of moderate physical activity every day. Regular physical activity can lower high blood pressure by about 5 to 8 mm Hg.  Eat a healthy diet Eating a diet rich in whole grains, fruits, vegetables, and low-fat dairy products and low in saturated fat and cholesterol. A healthy diet can lower high blood pressure by up to 11 mm Hg.  Reduce salt (sodium) in your diet Even a small reduction of sodium in the diet can improve heart health and reduce high blood pressure by about 5 to 6 mm Hg.  Limit alcohol One drink equals 12 ounces of beer, 5 ounces of wine, or 1.5 ounces of 80-proof liquor.  Limiting alcohol to less than one drink a day for women or two drinks a day for men can help lower blood pressure by about 4 mm Hg.   If you have any questions or concerns please use My Chart to send questions or call the office at [phone]

## 2024-04-16 NOTE — Assessment & Plan Note (Signed)
 Assessment: BP in office today is 122/76 Patient with HFrEF  (30-35% by echo 8/25) Tolerates current medications without any side effects Denies SOB, palpitation, chest pain, headaches,or edema No concerns with regards to cost of medications, compliance, ability to pick up at pharmacy Reviewed HF diagnosis and purpose of GDMT.  Reviewed mechanism of action of the 4 medications and side effects/benefits of each.  Answered all patient questions.  Reiterated the importance of regular exercise and low salt diet  Plan: GDMT ACEI/ARB/ARNI Entresto  97/103 bid   Beta blocker Metoprolol  succ 50 mg daily   MRA Spironolactone  12.5 mg daily New start today  SGLT2 Empagliflozin 25 mg daily    Labs orderd:  BMET in 10 days Follow up with PharmD in 3-4 weeks

## 2024-04-25 DIAGNOSIS — H3581 Retinal edema: Secondary | ICD-10-CM | POA: Diagnosis not present

## 2024-04-25 DIAGNOSIS — H524 Presbyopia: Secondary | ICD-10-CM | POA: Diagnosis not present

## 2024-04-25 DIAGNOSIS — H35032 Hypertensive retinopathy, left eye: Secondary | ICD-10-CM | POA: Diagnosis not present

## 2024-04-25 DIAGNOSIS — H35362 Drusen (degenerative) of macula, left eye: Secondary | ICD-10-CM | POA: Diagnosis not present

## 2024-04-25 DIAGNOSIS — E113393 Type 2 diabetes mellitus with moderate nonproliferative diabetic retinopathy without macular edema, bilateral: Secondary | ICD-10-CM | POA: Diagnosis not present

## 2024-04-29 DIAGNOSIS — E113411 Type 2 diabetes mellitus with severe nonproliferative diabetic retinopathy with macular edema, right eye: Secondary | ICD-10-CM | POA: Diagnosis not present

## 2024-04-29 DIAGNOSIS — E113412 Type 2 diabetes mellitus with severe nonproliferative diabetic retinopathy with macular edema, left eye: Secondary | ICD-10-CM | POA: Diagnosis not present

## 2024-04-29 DIAGNOSIS — H35351 Cystoid macular degeneration, right eye: Secondary | ICD-10-CM | POA: Diagnosis not present

## 2024-04-29 DIAGNOSIS — H35373 Puckering of macula, bilateral: Secondary | ICD-10-CM | POA: Diagnosis not present

## 2024-04-29 DIAGNOSIS — I428 Other cardiomyopathies: Secondary | ICD-10-CM | POA: Diagnosis not present

## 2024-04-29 DIAGNOSIS — H401131 Primary open-angle glaucoma, bilateral, mild stage: Secondary | ICD-10-CM | POA: Diagnosis not present

## 2024-04-30 ENCOUNTER — Ambulatory Visit: Payer: Self-pay | Admitting: Pharmacist Clinician (PhC)/ Clinical Pharmacy Specialist

## 2024-04-30 LAB — BASIC METABOLIC PANEL WITH GFR
BUN/Creatinine Ratio: 21 (ref 10–24)
BUN: 20 mg/dL (ref 8–27)
CO2: 22 mmol/L (ref 20–29)
Calcium: 10 mg/dL (ref 8.6–10.2)
Chloride: 101 mmol/L (ref 96–106)
Creatinine, Ser: 0.96 mg/dL (ref 0.76–1.27)
Glucose: 116 mg/dL — ABNORMAL HIGH (ref 70–99)
Potassium: 4.7 mmol/L (ref 3.5–5.2)
Sodium: 140 mmol/L (ref 134–144)
eGFR: 86 mL/min/1.73 (ref >59)

## 2024-05-08 DIAGNOSIS — H35372 Puckering of macula, left eye: Secondary | ICD-10-CM | POA: Diagnosis not present

## 2024-05-08 DIAGNOSIS — E113412 Type 2 diabetes mellitus with severe nonproliferative diabetic retinopathy with macular edema, left eye: Secondary | ICD-10-CM | POA: Diagnosis not present

## 2024-05-08 DIAGNOSIS — H35351 Cystoid macular degeneration, right eye: Secondary | ICD-10-CM | POA: Diagnosis not present

## 2024-05-08 DIAGNOSIS — E113411 Type 2 diabetes mellitus with severe nonproliferative diabetic retinopathy with macular edema, right eye: Secondary | ICD-10-CM | POA: Diagnosis not present

## 2024-05-08 DIAGNOSIS — H35371 Puckering of macula, right eye: Secondary | ICD-10-CM | POA: Diagnosis not present

## 2024-05-08 DIAGNOSIS — H401131 Primary open-angle glaucoma, bilateral, mild stage: Secondary | ICD-10-CM | POA: Diagnosis not present

## 2024-05-14 ENCOUNTER — Encounter: Payer: Self-pay | Admitting: Pharmacist Clinician (PhC)/ Clinical Pharmacy Specialist

## 2024-05-14 ENCOUNTER — Ambulatory Visit: Attending: Internal Medicine | Admitting: Pharmacist Clinician (PhC)/ Clinical Pharmacy Specialist

## 2024-05-14 VITALS — BP 114/74 | HR 88

## 2024-05-14 DIAGNOSIS — I5022 Chronic systolic (congestive) heart failure: Secondary | ICD-10-CM | POA: Diagnosis not present

## 2024-05-14 NOTE — Progress Notes (Signed)
 Office Visit    Patient Name: Brian Herring Date of Encounter: 05/14/2024  Primary Care Provider:  Okey Carlin Redbird, MD Primary Cardiologist:  None  Chief Complaint    Heart Failure Medication Titration - EF 30-35%% (by echo 03/06/24)  Significant Past Medical History   HTN On metoprolol  succ, entresto   DM2 7/25 A1c 7.2, on rybelsus, metformin, emapgliflozin  HLD 7/25 LDL 69 on rosuvastatin  10    Allergies  Allergen Reactions   Elemental Sulfur Rash   Glipizide     Other reaction(s): intolerant   Norvasc  [Amlodipine  Besylate] Rash   Sulfa  Antibiotics Rash    History of Present Illness    Brian Herring is a 68 y.o. male patient of Dr Elmira, in the office today to discuss heart failure medication.  He had been on 3/4 GDMT (no spironolactone ).  His echocardiograms in 2020 and 2021 were stable in the 40% range, however the most recent (August 2025) showed a drip to 25-30%.  I saw him last month and reviewed the importance of GDMT for managing HF.  Re-started spironolactone  at 12.5 mg daily and labs drawn 2 weeks later were stable.   Today he returns for follow up.  He is feeling well and has started to walk for exercise again.  Currently up to about 1/2 mile per day, limiting factor is comfort with his prosthesis.  Currently he is scheduled for a repeat echocardiogram in November, but is asking to reschedule to January because of some other unplanned expenses.    Blood Pressure Goal:  130/80  GDMT: ACEI/ARB/ARNI [x] Yes [] No Entresto  97/103 mg bid  Beta blocker [x] Yes [] No Metoprolol  succ 50 mg daily  MRA [x] Yes [] No Spironolactone  12.5 mg daily  SGLT2 inhibitor [x] Yes [] No Empagliflozin 25 mg daily     Social Hx:      Tobacco: no  Alcohol: no  Diet: Eats mostly home cooked; protein is PB, chicken, beef;  vegetables fresh mostly; snacking is mostly peanut butter (spoonfuls); unsweet tea (with Equal) or water , occasional milk  Exercise: Started walking  recently, up to 1/2 mile (since amputation)  Home BP readings:  13 readings over past 3 weeks shows home average of 115/70 (range 104-125/56-80)   Accessory Clinical Findings    Lab Results  Component Value Date   CREATININE 0.96 04/29/2024   BUN 20 04/29/2024   NA 140 04/29/2024   K 4.7 04/29/2024   CL 101 04/29/2024   CO2 22 04/29/2024   Lab Results  Component Value Date   ALT 27 01/08/2018   AST 30 01/08/2018   ALKPHOS 98 01/08/2018   BILITOT 0.3 01/08/2018   Lab Results  Component Value Date   HGBA1C 7.0 (H) 03/12/2018    Home Medications/Allergies    Current Outpatient Medications  Medication Sig Dispense Refill   blood glucose meter kit and supplies KIT Dispense based on patient and insurance preference. Use up to four times daily as directed. (FOR ICD-10: E 11.65). 1 each 0   furosemide  (LASIX ) 40 MG tablet Take 1 tablet (40 mg total) by mouth as needed. 60 tablet 4   JARDIANCE 25 MG TABS tablet Take 1 tablet by mouth daily.     Lancets (ONETOUCH DELICA PLUS LANCET33G) MISC Use to test blood sugar daily  3   metFORMIN (GLUCOPHAGE-XR) 500 MG 24 hr tablet Take 500 mg by mouth 3 (three) times daily. 1tab in the morning, 2 tabs at bedtime     metoprolol  succinate (TOPROL -XL) 50 MG 24  hr tablet Take 1 tablet (50 mg total) by mouth daily. 90 tablet 3   mupirocin ointment (BACTROBAN) 2 % 1 application     ONE TOUCH ULTRA TEST test strip Use to test blood sugar daily  1   rosuvastatin  (CRESTOR ) 10 MG tablet Take 1 tablet (10 mg total) by mouth daily. 90 tablet 1   RYBELSUS 7 MG TABS Take 1 tablet by mouth every morning.     sacubitril -valsartan  (ENTRESTO ) 97-103 MG Take 1 tablet by mouth 2 (two) times daily. 180 tablet 3   spironolactone  (ALDACTONE ) 25 MG tablet Take 0.5 tablets (12.5 mg total) by mouth daily. 45 tablet 3   No current facility-administered medications for this visit.     Allergies  Allergen Reactions   Elemental Sulfur Rash   Glipizide     Other  reaction(s): intolerant   Norvasc  [Amlodipine  Besylate] Rash   Sulfa  Antibiotics Rash       Assessment & Plan      Chronic systolic CHF (congestive heart failure) (HCC) Assessment: BP in office today is 114/74 Patient with HFrEF   Tolerates current medications without any side effects Denies SOB, palpitation, chest pain, headaches,or edema No concerns with regards to cost of medications, compliance, ability to pick up at pharmacy Reiterated the importance of regular exercise and low salt diet Potassium at 4.7 today, has had hyperkalemia at times in the past  Plan: GDMT ACEI/ARB/ARNI Entresto  97/103 mg bid   Beta blocker Metoprolol  succ 50 mg daily   MRA Spironolactone  12.5 mg daily   SGLT2 Empagliflozin 25 mg daily    No changes to medications today.  Probably could increase spironolactone  to full tablet, as BP still > 100 systolic, however also has had episodes of hyperkalemia, so will just continue with current dose.   No follow up labs ordered at this time Follow up with echo in January and Dr. Elmira as indicated.      Allean Mink PharmD CPP [facility]  [address] [place], [place] [address] [phone]

## 2024-05-14 NOTE — Patient Instructions (Addendum)
 Follow up appointment: with Dr. Elmira after your Echo  Take meds as follows: No changes in your medications at this time.    Check your blood pressure at home two to three days per week, and keep record of the readings.  Your blood pressure goal is <130/80d  To check your pressure at home you will need to:  1. Sit up in a chair, with feet flat on the floor and back supported. Do not cross your ankles or legs. 2. Rest your left arm so that the cuff is about heart level. If the cuff goes on your upper arm,  then just relax the arm on the table, arm of the chair or your lap. If you have a wrist cuff, we  suggest relaxing your wrist against your chest (think of it as Pledging the Flag with the  wrong arm).  3. Place the cuff snugly around your arm, about 1 inch above the crook of your elbow. The  cords should be inside the groove of your elbow.  4. Sit quietly, with the cuff in place, for about 5 minutes. After that 5 minutes press the power  button to start a reading. 5. Do not talk or move while the reading is taking place.  6. Record your readings on a sheet of paper. Although most cuffs have a memory, it is often  easier to see a pattern developing when the numbers are all in front of you.  7. You can repeat the reading after 1-3 minutes if it is recommended  Make sure your bladder is empty and you have not had caffeine or tobacco within the last 30 min  Always bring your blood pressure log with you to your appointments. If you have not brought your monitor in to be double checked for accuracy, please bring it to your next appointment.  You can find a list of quality blood pressure cuffs at [email or web address]  Important lifestyle changes to control high blood pressure  Intervention  Effect on the BP  Lose extra pounds and watch your waistline Weight loss is one of the most effective lifestyle changes for controlling blood pressure. If you're overweight or obese, losing even a small  amount of weight can help reduce blood pressure. Blood pressure might go down by about 1 millimeter of mercury (mm Hg) with each kilogram (about 2.2 pounds) of weight lost.  Exercise regularly As a general goal, aim for at least 30 minutes of moderate physical activity every day. Regular physical activity can lower high blood pressure by about 5 to 8 mm Hg.  Eat a healthy diet Eating a diet rich in whole grains, fruits, vegetables, and low-fat dairy products and low in saturated fat and cholesterol. A healthy diet can lower high blood pressure by up to 11 mm Hg.  Reduce salt (sodium) in your diet Even a small reduction of sodium in the diet can improve heart health and reduce high blood pressure by about 5 to 6 mm Hg.  Limit alcohol One drink equals 12 ounces of beer, 5 ounces of wine, or 1.5 ounces of 80-proof liquor.  Limiting alcohol to less than one drink a day for women or two drinks a day for men can help lower blood pressure by about 4 mm Hg.   If you have any questions or concerns please use My Chart to send questions or call the office at [phone]

## 2024-05-14 NOTE — Assessment & Plan Note (Signed)
 Assessment: BP in office today is 114/74 Patient with HFrEF   Tolerates current medications without any side effects Denies SOB, palpitation, chest pain, headaches,or edema No concerns with regards to cost of medications, compliance, ability to pick up at pharmacy Reiterated the importance of regular exercise and low salt diet Potassium at 4.7 today, has had hyperkalemia at times in the past  Plan: GDMT ACEI/ARB/ARNI Entresto  97/103 mg bid   Beta blocker Metoprolol  succ 50 mg daily   MRA Spironolactone  12.5 mg daily   SGLT2 Empagliflozin 25 mg daily    No changes to medications today.  Probably could increase spironolactone  to full tablet, as BP still > 100 systolic, however also has had episodes of hyperkalemia, so will just continue with current dose.   No follow up labs ordered at this time Follow up with echo in January and Dr. Elmira as indicated.

## 2024-06-05 ENCOUNTER — Other Ambulatory Visit (HOSPITAL_COMMUNITY)

## 2024-06-10 DIAGNOSIS — E113412 Type 2 diabetes mellitus with severe nonproliferative diabetic retinopathy with macular edema, left eye: Secondary | ICD-10-CM | POA: Diagnosis not present

## 2024-06-10 DIAGNOSIS — H35351 Cystoid macular degeneration, right eye: Secondary | ICD-10-CM | POA: Diagnosis not present

## 2024-06-10 DIAGNOSIS — H35371 Puckering of macula, right eye: Secondary | ICD-10-CM | POA: Diagnosis not present

## 2024-06-10 DIAGNOSIS — H35372 Puckering of macula, left eye: Secondary | ICD-10-CM | POA: Diagnosis not present

## 2024-06-10 DIAGNOSIS — H401131 Primary open-angle glaucoma, bilateral, mild stage: Secondary | ICD-10-CM | POA: Diagnosis not present

## 2024-06-10 DIAGNOSIS — E113411 Type 2 diabetes mellitus with severe nonproliferative diabetic retinopathy with macular edema, right eye: Secondary | ICD-10-CM | POA: Diagnosis not present

## 2024-06-13 DIAGNOSIS — E113412 Type 2 diabetes mellitus with severe nonproliferative diabetic retinopathy with macular edema, left eye: Secondary | ICD-10-CM | POA: Diagnosis not present

## 2024-06-13 DIAGNOSIS — H35351 Cystoid macular degeneration, right eye: Secondary | ICD-10-CM | POA: Diagnosis not present

## 2024-06-13 DIAGNOSIS — E113411 Type 2 diabetes mellitus with severe nonproliferative diabetic retinopathy with macular edema, right eye: Secondary | ICD-10-CM | POA: Diagnosis not present

## 2024-06-13 DIAGNOSIS — H401131 Primary open-angle glaucoma, bilateral, mild stage: Secondary | ICD-10-CM | POA: Diagnosis not present

## 2024-06-13 DIAGNOSIS — H35371 Puckering of macula, right eye: Secondary | ICD-10-CM | POA: Diagnosis not present

## 2024-06-13 DIAGNOSIS — H35372 Puckering of macula, left eye: Secondary | ICD-10-CM | POA: Diagnosis not present

## 2024-08-02 LAB — LAB REPORT - SCANNED
A1c: 7.7
EGFR: 81

## 2024-08-05 ENCOUNTER — Ambulatory Visit (HOSPITAL_COMMUNITY)

## 2024-09-05 ENCOUNTER — Ambulatory Visit (HOSPITAL_COMMUNITY)
